# Patient Record
Sex: Female | Born: 1945 | Race: Black or African American | Hispanic: No | Marital: Single | Smoking: Current every day smoker
Health system: Southern US, Community
[De-identification: ages and names within clinical notes are randomized; demographics above are authoritative.]

## PROBLEM LIST (undated history)

## (undated) DIAGNOSIS — F17200 Nicotine dependence, unspecified, uncomplicated: Secondary | ICD-10-CM

## (undated) DIAGNOSIS — M1990 Unspecified osteoarthritis, unspecified site: Secondary | ICD-10-CM

## (undated) DIAGNOSIS — M48061 Spinal stenosis, lumbar region without neurogenic claudication: Secondary | ICD-10-CM

## (undated) DIAGNOSIS — I10 Essential (primary) hypertension: Secondary | ICD-10-CM

## (undated) DIAGNOSIS — N289 Disorder of kidney and ureter, unspecified: Secondary | ICD-10-CM

## (undated) DIAGNOSIS — J189 Pneumonia, unspecified organism: Secondary | ICD-10-CM

## (undated) DIAGNOSIS — E785 Hyperlipidemia, unspecified: Secondary | ICD-10-CM

## (undated) DIAGNOSIS — J45909 Unspecified asthma, uncomplicated: Secondary | ICD-10-CM

## (undated) DIAGNOSIS — R51 Headache: Secondary | ICD-10-CM

## (undated) DIAGNOSIS — J309 Allergic rhinitis, unspecified: Secondary | ICD-10-CM

## (undated) DIAGNOSIS — IMO0001 Reserved for inherently not codable concepts without codable children: Secondary | ICD-10-CM

## (undated) HISTORY — PX: COLONOSCOPY: SHX5424

## (undated) HISTORY — DX: Essential (primary) hypertension: I10

## (undated) HISTORY — DX: Unspecified asthma, uncomplicated: J45.909

## (undated) HISTORY — DX: Allergic rhinitis, unspecified: J30.9

## (undated) HISTORY — DX: Disorder of kidney and ureter, unspecified: N28.9

## (undated) HISTORY — DX: Spinal stenosis, lumbar region without neurogenic claudication: M48.061

## (undated) HISTORY — DX: Headache: R51

## (undated) HISTORY — DX: Nicotine dependence, unspecified, uncomplicated: F17.200

## (undated) HISTORY — DX: Hyperlipidemia, unspecified: E78.5

---

## 1988-05-18 HISTORY — PX: OTHER SURGICAL HISTORY: SHX169

## 1993-05-18 HISTORY — PX: OTHER SURGICAL HISTORY: SHX169

## 1997-11-10 ENCOUNTER — Emergency Department (HOSPITAL_COMMUNITY): Admission: EM | Admit: 1997-11-10 | Discharge: 1997-11-10 | Payer: Self-pay | Admitting: Emergency Medicine

## 1998-03-08 ENCOUNTER — Emergency Department (HOSPITAL_COMMUNITY): Admission: EM | Admit: 1998-03-08 | Discharge: 1998-03-08 | Payer: Self-pay | Admitting: Emergency Medicine

## 1999-12-14 ENCOUNTER — Inpatient Hospital Stay (HOSPITAL_COMMUNITY): Admission: AD | Admit: 1999-12-14 | Discharge: 1999-12-14 | Payer: Self-pay | Admitting: Obstetrics

## 2000-11-04 ENCOUNTER — Other Ambulatory Visit: Admission: RE | Admit: 2000-11-04 | Discharge: 2000-11-04 | Payer: Self-pay | Admitting: General Practice

## 2000-11-15 ENCOUNTER — Ambulatory Visit (HOSPITAL_COMMUNITY): Admission: RE | Admit: 2000-11-15 | Discharge: 2000-11-15 | Payer: Self-pay | Admitting: Cardiology

## 2000-11-15 ENCOUNTER — Encounter: Payer: Self-pay | Admitting: Cardiology

## 2001-11-21 ENCOUNTER — Emergency Department (HOSPITAL_COMMUNITY): Admission: EM | Admit: 2001-11-21 | Discharge: 2001-11-22 | Payer: Self-pay | Admitting: Emergency Medicine

## 2002-06-01 ENCOUNTER — Encounter: Payer: Self-pay | Admitting: Cardiology

## 2002-06-01 ENCOUNTER — Encounter: Admission: RE | Admit: 2002-06-01 | Discharge: 2002-06-01 | Payer: Self-pay | Admitting: Cardiology

## 2002-06-16 ENCOUNTER — Encounter: Admission: RE | Admit: 2002-06-16 | Discharge: 2002-06-16 | Payer: Self-pay | Admitting: *Deleted

## 2002-06-16 ENCOUNTER — Other Ambulatory Visit: Admission: RE | Admit: 2002-06-16 | Discharge: 2002-06-16 | Payer: Self-pay | Admitting: *Deleted

## 2002-07-07 ENCOUNTER — Encounter (INDEPENDENT_AMBULATORY_CARE_PROVIDER_SITE_OTHER): Payer: Self-pay | Admitting: *Deleted

## 2002-07-07 ENCOUNTER — Other Ambulatory Visit: Admission: RE | Admit: 2002-07-07 | Discharge: 2002-07-07 | Payer: Self-pay | Admitting: *Deleted

## 2002-07-07 ENCOUNTER — Encounter: Admission: RE | Admit: 2002-07-07 | Discharge: 2002-07-07 | Payer: Self-pay | Admitting: *Deleted

## 2003-01-30 ENCOUNTER — Encounter: Payer: Self-pay | Admitting: Emergency Medicine

## 2003-01-30 ENCOUNTER — Emergency Department (HOSPITAL_COMMUNITY): Admission: EM | Admit: 2003-01-30 | Discharge: 2003-01-30 | Payer: Self-pay | Admitting: Emergency Medicine

## 2003-03-26 ENCOUNTER — Inpatient Hospital Stay (HOSPITAL_COMMUNITY): Admission: RE | Admit: 2003-03-26 | Discharge: 2003-04-01 | Payer: Self-pay | Admitting: Neurosurgery

## 2003-04-03 ENCOUNTER — Emergency Department (HOSPITAL_COMMUNITY): Admission: EM | Admit: 2003-04-03 | Discharge: 2003-04-03 | Payer: Self-pay | Admitting: Emergency Medicine

## 2003-06-05 ENCOUNTER — Encounter: Admission: RE | Admit: 2003-06-05 | Discharge: 2003-06-05 | Payer: Self-pay | Admitting: Neurosurgery

## 2004-06-02 ENCOUNTER — Emergency Department (HOSPITAL_COMMUNITY): Admission: EM | Admit: 2004-06-02 | Discharge: 2004-06-02 | Payer: Self-pay | Admitting: Emergency Medicine

## 2004-08-05 ENCOUNTER — Emergency Department (HOSPITAL_COMMUNITY): Admission: EM | Admit: 2004-08-05 | Discharge: 2004-08-05 | Payer: Self-pay | Admitting: Emergency Medicine

## 2004-08-06 ENCOUNTER — Emergency Department (HOSPITAL_COMMUNITY): Admission: EM | Admit: 2004-08-06 | Discharge: 2004-08-06 | Payer: Self-pay | Admitting: Emergency Medicine

## 2004-12-15 ENCOUNTER — Ambulatory Visit (HOSPITAL_COMMUNITY): Admission: RE | Admit: 2004-12-15 | Discharge: 2004-12-15 | Payer: Self-pay | Admitting: *Deleted

## 2005-01-01 ENCOUNTER — Ambulatory Visit: Payer: Self-pay | Admitting: Obstetrics and Gynecology

## 2005-05-03 ENCOUNTER — Emergency Department (HOSPITAL_COMMUNITY): Admission: EM | Admit: 2005-05-03 | Discharge: 2005-05-03 | Payer: Self-pay | Admitting: Emergency Medicine

## 2005-06-03 ENCOUNTER — Ambulatory Visit (HOSPITAL_COMMUNITY): Admission: RE | Admit: 2005-06-03 | Discharge: 2005-06-03 | Payer: Self-pay | Admitting: Cardiology

## 2005-08-20 ENCOUNTER — Emergency Department (HOSPITAL_COMMUNITY): Admission: EM | Admit: 2005-08-20 | Discharge: 2005-08-20 | Payer: Self-pay | Admitting: Emergency Medicine

## 2005-08-28 ENCOUNTER — Emergency Department (HOSPITAL_COMMUNITY): Admission: EM | Admit: 2005-08-28 | Discharge: 2005-08-28 | Payer: Self-pay | Admitting: Emergency Medicine

## 2005-09-06 ENCOUNTER — Emergency Department (HOSPITAL_COMMUNITY): Admission: EM | Admit: 2005-09-06 | Discharge: 2005-09-06 | Payer: Self-pay | Admitting: Emergency Medicine

## 2005-12-17 ENCOUNTER — Emergency Department (HOSPITAL_COMMUNITY): Admission: EM | Admit: 2005-12-17 | Discharge: 2005-12-17 | Payer: Self-pay | Admitting: Emergency Medicine

## 2006-01-04 ENCOUNTER — Ambulatory Visit (HOSPITAL_COMMUNITY): Admission: RE | Admit: 2006-01-04 | Discharge: 2006-01-04 | Payer: Self-pay | Admitting: Cardiology

## 2006-01-15 ENCOUNTER — Encounter: Admission: RE | Admit: 2006-01-15 | Discharge: 2006-01-15 | Payer: Self-pay | Admitting: Cardiology

## 2006-01-19 ENCOUNTER — Inpatient Hospital Stay (HOSPITAL_COMMUNITY): Admission: EM | Admit: 2006-01-19 | Discharge: 2006-01-23 | Payer: Self-pay | Admitting: Emergency Medicine

## 2006-03-24 ENCOUNTER — Ambulatory Visit: Payer: Self-pay | Admitting: Obstetrics & Gynecology

## 2006-03-24 ENCOUNTER — Encounter (INDEPENDENT_AMBULATORY_CARE_PROVIDER_SITE_OTHER): Payer: Self-pay | Admitting: Obstetrics & Gynecology

## 2006-03-28 ENCOUNTER — Encounter: Admission: RE | Admit: 2006-03-28 | Discharge: 2006-03-28 | Payer: Self-pay | Admitting: Neurosurgery

## 2006-04-19 ENCOUNTER — Encounter: Admission: RE | Admit: 2006-04-19 | Discharge: 2006-05-18 | Payer: Self-pay | Admitting: Neurosurgery

## 2006-05-19 ENCOUNTER — Encounter: Admission: RE | Admit: 2006-05-19 | Discharge: 2006-08-17 | Payer: Self-pay | Admitting: Neurosurgery

## 2006-07-10 ENCOUNTER — Emergency Department (HOSPITAL_COMMUNITY): Admission: EM | Admit: 2006-07-10 | Discharge: 2006-07-10 | Payer: Self-pay | Admitting: Emergency Medicine

## 2006-08-10 ENCOUNTER — Emergency Department (HOSPITAL_COMMUNITY): Admission: EM | Admit: 2006-08-10 | Discharge: 2006-08-10 | Payer: Self-pay | Admitting: Family Medicine

## 2006-08-11 ENCOUNTER — Emergency Department (HOSPITAL_COMMUNITY): Admission: EM | Admit: 2006-08-11 | Discharge: 2006-08-11 | Payer: Self-pay | Admitting: Emergency Medicine

## 2006-12-04 ENCOUNTER — Emergency Department (HOSPITAL_COMMUNITY): Admission: EM | Admit: 2006-12-04 | Discharge: 2006-12-05 | Payer: Self-pay | Admitting: Emergency Medicine

## 2007-01-06 ENCOUNTER — Ambulatory Visit (HOSPITAL_COMMUNITY): Admission: RE | Admit: 2007-01-06 | Discharge: 2007-01-06 | Payer: Self-pay | Admitting: Cardiology

## 2007-01-06 ENCOUNTER — Encounter (INDEPENDENT_AMBULATORY_CARE_PROVIDER_SITE_OTHER): Payer: Self-pay | Admitting: Gynecology

## 2007-01-06 ENCOUNTER — Ambulatory Visit: Payer: Self-pay | Admitting: Gynecology

## 2007-01-07 ENCOUNTER — Emergency Department (HOSPITAL_COMMUNITY): Admission: EM | Admit: 2007-01-07 | Discharge: 2007-01-08 | Payer: Self-pay | Admitting: Emergency Medicine

## 2007-01-26 ENCOUNTER — Emergency Department (HOSPITAL_COMMUNITY): Admission: EM | Admit: 2007-01-26 | Discharge: 2007-01-26 | Payer: Self-pay | Admitting: Emergency Medicine

## 2007-02-22 ENCOUNTER — Emergency Department (HOSPITAL_COMMUNITY): Admission: EM | Admit: 2007-02-22 | Discharge: 2007-02-22 | Payer: Self-pay | Admitting: Emergency Medicine

## 2007-02-23 ENCOUNTER — Emergency Department (HOSPITAL_COMMUNITY): Admission: EM | Admit: 2007-02-23 | Discharge: 2007-02-23 | Payer: Self-pay | Admitting: Emergency Medicine

## 2007-06-06 ENCOUNTER — Emergency Department (HOSPITAL_COMMUNITY): Admission: EM | Admit: 2007-06-06 | Discharge: 2007-06-06 | Payer: Self-pay | Admitting: Family Medicine

## 2007-06-07 ENCOUNTER — Inpatient Hospital Stay (HOSPITAL_COMMUNITY): Admission: EM | Admit: 2007-06-07 | Discharge: 2007-06-13 | Payer: Self-pay | Admitting: Emergency Medicine

## 2007-06-24 ENCOUNTER — Ambulatory Visit (HOSPITAL_COMMUNITY): Admission: RE | Admit: 2007-06-24 | Discharge: 2007-06-24 | Payer: Self-pay | Admitting: Cardiology

## 2007-08-08 ENCOUNTER — Emergency Department (HOSPITAL_COMMUNITY): Admission: EM | Admit: 2007-08-08 | Discharge: 2007-08-08 | Payer: Self-pay | Admitting: Emergency Medicine

## 2007-08-09 ENCOUNTER — Emergency Department (HOSPITAL_COMMUNITY): Admission: EM | Admit: 2007-08-09 | Discharge: 2007-08-09 | Payer: Self-pay | Admitting: Emergency Medicine

## 2007-09-07 ENCOUNTER — Emergency Department (HOSPITAL_COMMUNITY): Admission: EM | Admit: 2007-09-07 | Discharge: 2007-09-07 | Payer: Self-pay | Admitting: Emergency Medicine

## 2008-01-04 ENCOUNTER — Emergency Department (HOSPITAL_COMMUNITY): Admission: EM | Admit: 2008-01-04 | Discharge: 2008-01-04 | Payer: Self-pay | Admitting: Family Medicine

## 2008-01-19 ENCOUNTER — Ambulatory Visit (HOSPITAL_COMMUNITY): Admission: RE | Admit: 2008-01-19 | Discharge: 2008-01-19 | Payer: Self-pay | Admitting: Cardiology

## 2008-02-01 ENCOUNTER — Encounter: Payer: Self-pay | Admitting: Obstetrics & Gynecology

## 2008-02-01 ENCOUNTER — Ambulatory Visit: Payer: Self-pay | Admitting: Obstetrics & Gynecology

## 2008-05-24 ENCOUNTER — Inpatient Hospital Stay (HOSPITAL_COMMUNITY): Admission: AD | Admit: 2008-05-24 | Discharge: 2008-05-24 | Payer: Self-pay | Admitting: Family Medicine

## 2008-09-07 ENCOUNTER — Inpatient Hospital Stay (HOSPITAL_COMMUNITY): Admission: AD | Admit: 2008-09-07 | Discharge: 2008-09-07 | Payer: Self-pay | Admitting: Obstetrics & Gynecology

## 2008-09-07 ENCOUNTER — Ambulatory Visit: Payer: Self-pay | Admitting: Family

## 2008-09-24 ENCOUNTER — Emergency Department (HOSPITAL_COMMUNITY): Admission: EM | Admit: 2008-09-24 | Discharge: 2008-09-24 | Payer: Self-pay | Admitting: Family Medicine

## 2008-10-01 ENCOUNTER — Encounter: Admission: RE | Admit: 2008-10-01 | Discharge: 2008-10-01 | Payer: Self-pay | Admitting: Cardiology

## 2008-10-18 ENCOUNTER — Ambulatory Visit: Payer: Self-pay | Admitting: Obstetrics and Gynecology

## 2008-10-18 LAB — CONVERTED CEMR LAB
Trich, Wet Prep: NONE SEEN
Yeast Wet Prep HPF POC: NONE SEEN

## 2008-10-19 ENCOUNTER — Encounter: Payer: Self-pay | Admitting: Obstetrics and Gynecology

## 2008-10-19 ENCOUNTER — Inpatient Hospital Stay (HOSPITAL_COMMUNITY): Admission: AD | Admit: 2008-10-19 | Discharge: 2008-10-19 | Payer: Self-pay | Admitting: Obstetrics & Gynecology

## 2008-10-19 LAB — CONVERTED CEMR LAB

## 2008-10-25 ENCOUNTER — Ambulatory Visit: Payer: Self-pay | Admitting: Obstetrics & Gynecology

## 2008-10-25 ENCOUNTER — Encounter: Payer: Self-pay | Admitting: Obstetrics and Gynecology

## 2008-10-25 LAB — CONVERTED CEMR LAB
CA 125: 4.3 units/mL (ref 0.0–30.2)
HCT: 41 % (ref 36.0–46.0)
Platelets: 195 10*3/uL (ref 150–400)
RDW: 14.1 % (ref 11.5–15.5)
TSH: 0.691 microintl units/mL (ref 0.350–4.500)
WBC: 9.2 10*3/uL (ref 4.0–10.5)

## 2008-12-20 ENCOUNTER — Ambulatory Visit (HOSPITAL_COMMUNITY): Admission: RE | Admit: 2008-12-20 | Discharge: 2008-12-20 | Payer: Self-pay | Admitting: Obstetrics & Gynecology

## 2008-12-21 ENCOUNTER — Emergency Department (HOSPITAL_COMMUNITY): Admission: EM | Admit: 2008-12-21 | Discharge: 2008-12-21 | Payer: Self-pay | Admitting: Family Medicine

## 2009-01-10 ENCOUNTER — Ambulatory Visit: Payer: Self-pay | Admitting: Obstetrics and Gynecology

## 2009-01-22 ENCOUNTER — Ambulatory Visit (HOSPITAL_COMMUNITY): Admission: RE | Admit: 2009-01-22 | Discharge: 2009-01-22 | Payer: Self-pay | Admitting: Cardiology

## 2009-01-31 ENCOUNTER — Ambulatory Visit: Payer: Self-pay | Admitting: Obstetrics and Gynecology

## 2009-01-31 LAB — CONVERTED CEMR LAB

## 2009-02-20 ENCOUNTER — Ambulatory Visit: Payer: Self-pay | Admitting: Internal Medicine

## 2009-02-20 DIAGNOSIS — J45909 Unspecified asthma, uncomplicated: Secondary | ICD-10-CM

## 2009-02-20 DIAGNOSIS — E785 Hyperlipidemia, unspecified: Secondary | ICD-10-CM

## 2009-02-20 DIAGNOSIS — M48061 Spinal stenosis, lumbar region without neurogenic claudication: Secondary | ICD-10-CM | POA: Insufficient documentation

## 2009-02-20 DIAGNOSIS — I10 Essential (primary) hypertension: Secondary | ICD-10-CM | POA: Insufficient documentation

## 2009-02-20 DIAGNOSIS — M4802 Spinal stenosis, cervical region: Secondary | ICD-10-CM | POA: Insufficient documentation

## 2009-02-20 DIAGNOSIS — M25519 Pain in unspecified shoulder: Secondary | ICD-10-CM

## 2009-02-20 DIAGNOSIS — M545 Low back pain: Secondary | ICD-10-CM | POA: Insufficient documentation

## 2009-02-20 DIAGNOSIS — F17200 Nicotine dependence, unspecified, uncomplicated: Secondary | ICD-10-CM | POA: Insufficient documentation

## 2009-02-20 DIAGNOSIS — J309 Allergic rhinitis, unspecified: Secondary | ICD-10-CM | POA: Insufficient documentation

## 2009-02-20 DIAGNOSIS — M25559 Pain in unspecified hip: Secondary | ICD-10-CM | POA: Insufficient documentation

## 2009-02-20 HISTORY — DX: Hyperlipidemia, unspecified: E78.5

## 2009-02-22 ENCOUNTER — Telehealth: Payer: Self-pay | Admitting: Internal Medicine

## 2009-03-01 ENCOUNTER — Ambulatory Visit: Payer: Self-pay | Admitting: Obstetrics & Gynecology

## 2009-03-01 ENCOUNTER — Encounter: Payer: Self-pay | Admitting: Obstetrics & Gynecology

## 2009-03-05 ENCOUNTER — Encounter: Payer: Self-pay | Admitting: Internal Medicine

## 2009-03-08 ENCOUNTER — Encounter: Admission: RE | Admit: 2009-03-08 | Discharge: 2009-03-08 | Payer: Self-pay | Admitting: Neurological Surgery

## 2009-03-12 ENCOUNTER — Encounter: Payer: Self-pay | Admitting: Internal Medicine

## 2009-07-11 ENCOUNTER — Encounter: Payer: Self-pay | Admitting: Internal Medicine

## 2009-07-11 LAB — CONVERTED CEMR LAB: ALT: 19 units/L

## 2009-08-14 ENCOUNTER — Ambulatory Visit: Payer: Self-pay | Admitting: Internal Medicine

## 2009-08-14 DIAGNOSIS — M25569 Pain in unspecified knee: Secondary | ICD-10-CM

## 2009-08-16 ENCOUNTER — Emergency Department (HOSPITAL_COMMUNITY): Admission: EM | Admit: 2009-08-16 | Discharge: 2009-08-17 | Payer: Self-pay | Admitting: Emergency Medicine

## 2009-08-16 DIAGNOSIS — R51 Headache: Secondary | ICD-10-CM | POA: Insufficient documentation

## 2009-08-16 DIAGNOSIS — R519 Headache, unspecified: Secondary | ICD-10-CM | POA: Insufficient documentation

## 2009-08-16 LAB — CONVERTED CEMR LAB
BUN: 17 mg/dL
Basophils Relative: 0.3 %
Glucose, Bld: 83 mg/dL
Hemoglobin: 13.9 g/dL
MCV: 95.7 fL
Neutrophils Relative %: 64 %
Platelets: 171 10*3/uL
RBC: 4.15 M/uL
Sodium: 145 meq/L
WBC: 11.4 10*3/uL

## 2009-09-27 ENCOUNTER — Ambulatory Visit: Payer: Self-pay | Admitting: Internal Medicine

## 2009-09-27 LAB — CONVERTED CEMR LAB
Cholesterol: 173 mg/dL (ref 0–200)
HDL: 53.2 mg/dL (ref >39.00)
VLDL: 14.6 mg/dL (ref 0.0–40.0)

## 2009-10-02 ENCOUNTER — Ambulatory Visit (HOSPITAL_COMMUNITY): Admission: RE | Admit: 2009-10-02 | Discharge: 2009-10-02 | Payer: Self-pay | Admitting: Internal Medicine

## 2009-10-17 ENCOUNTER — Encounter: Admission: RE | Admit: 2009-10-17 | Discharge: 2009-11-04 | Payer: Self-pay | Admitting: Internal Medicine

## 2009-10-17 ENCOUNTER — Encounter: Payer: Self-pay | Admitting: Internal Medicine

## 2009-11-01 ENCOUNTER — Encounter: Payer: Self-pay | Admitting: Internal Medicine

## 2010-01-24 ENCOUNTER — Ambulatory Visit: Payer: Self-pay | Admitting: Internal Medicine

## 2010-03-10 ENCOUNTER — Ambulatory Visit (HOSPITAL_COMMUNITY): Admission: RE | Admit: 2010-03-10 | Discharge: 2010-03-10 | Payer: Self-pay | Admitting: Internal Medicine

## 2010-03-10 ENCOUNTER — Ambulatory Visit: Payer: Self-pay | Admitting: Obstetrics & Gynecology

## 2010-03-11 ENCOUNTER — Inpatient Hospital Stay (HOSPITAL_COMMUNITY): Admission: AD | Admit: 2010-03-11 | Discharge: 2010-03-11 | Payer: Self-pay | Admitting: Family Medicine

## 2010-03-11 ENCOUNTER — Ambulatory Visit: Payer: Self-pay | Admitting: Gynecology

## 2010-03-11 ENCOUNTER — Encounter: Payer: Self-pay | Admitting: Obstetrics & Gynecology

## 2010-03-11 LAB — CONVERTED CEMR LAB
Trich, Wet Prep: NONE SEEN
WBC, Wet Prep HPF POC: NONE SEEN

## 2010-03-13 ENCOUNTER — Ambulatory Visit: Payer: Self-pay | Admitting: Obstetrics & Gynecology

## 2010-03-13 ENCOUNTER — Ambulatory Visit (HOSPITAL_COMMUNITY): Admission: RE | Admit: 2010-03-13 | Discharge: 2010-03-13 | Payer: Self-pay | Admitting: Obstetrics & Gynecology

## 2010-05-02 ENCOUNTER — Ambulatory Visit: Payer: Self-pay | Admitting: Internal Medicine

## 2010-05-30 ENCOUNTER — Ambulatory Visit
Admission: RE | Admit: 2010-05-30 | Discharge: 2010-05-30 | Payer: Self-pay | Source: Home / Self Care | Attending: Internal Medicine | Admitting: Internal Medicine

## 2010-05-30 DIAGNOSIS — J45901 Unspecified asthma with (acute) exacerbation: Secondary | ICD-10-CM | POA: Insufficient documentation

## 2010-06-09 ENCOUNTER — Encounter: Payer: Self-pay | Admitting: *Deleted

## 2010-06-11 ENCOUNTER — Encounter: Payer: Self-pay | Admitting: Internal Medicine

## 2010-06-17 NOTE — Letter (Signed)
Summary:  [facility]   Imported By: Sherian Rein 07/26/2009 07:44:01  _____________________________________________________________________  External Attachment:    Type:   Image     Comment:   External Document

## 2010-06-17 NOTE — Assessment & Plan Note (Signed)
Summary: 4 MTH FU---STC   Vital Signs:  Patient profile:   65 year old female Menstrual status:  postmenopausal Height:      65.5 inches (166.37 cm) Weight:      139.0 pounds (63.18 kg) BMI:     22.86 O2 Sat:      97 % on Room air Temp:     98.1 degrees F (36.72 degrees C) oral Pulse rate:   94 / minute BP sitting:   142 / 98  (left arm) Cuff size:   regular  Vitals Entered By: Orlan Leavens RMA (January 24, 2010 1:16 PM)  O2 Flow:  Room air CC: 4 month follow-up Is Patient Diabetic? No Pain Assessment Patient in pain? no      Comments Want flu shot   Primary Care Provider:  Rene Paci, MD  CC:  4 month follow-up.  History of Present Illness: here for ER f/u seen 08/16/09 for HA - CT, labs and exam all reviewed today- given motrin 800mg  tabs and referral to neuro s/p mri due to abn ct head - SVD  hx right sided shoulder and arm pain - similar pain symptoms to when she had neck and back problems - operated on in past by Nsurg (2004) chronic right hand numbness hx -  reports injury to nerve with ABG lab check requiring surg in 1990s - subsquent disablitiy from injury and numbness - but no weakness min relief with ultram, ?other pain pill pain exac by activity but present constantly -even at rest or lying down saw NSurg Elsner for same in 02/2009 - notes reviewed -  had complete relief of pain for months following steroid shot in knee and shoulder  HTN hx - follows with dr. Sharyn Lull no recent change in meds - has "been good like it is for over 5 years"- recent noncompliance due to financial stressors - ran out of medications  dyslipidemia hx - on crestor for years -reports compliance with ongoing medical treatment and no changes in medication dose or frequency. denies adverse side effects related to current therapy.   asthma - reports compliance with ongoing medical treatment and no changes in medication dose or frequency. denies adverse side effects related to  current therapy.    Clinical Review Panels:  Prevention   Last Pap Smear:  NEGATIVE FOR INTRAEPITHELIAL LESIONS OR MALIGNANCY. (02/01/2008)  Lipid Management   Cholesterol:  173 (09/27/2009)   LDL (bad choesterol):  105 (09/27/2009)   HDL (good cholesterol):  53.20 (09/27/2009)  CBC   WBC:  11.4 (08/16/2009)   RBC:  4.15 (08/16/2009)   Hgb:  13.9 (08/16/2009)   Hct:  41.0 (08/16/2009)   Platelets:  171 (08/16/2009)   MCV  95.7 (08/16/2009)   MCHC  32.4 (10/25/2008)   RDW  12.9 (08/16/2009)   PMN:  64 (08/16/2009)   Monos:  6 (08/16/2009)   Eosinophils:  3 (08/16/2009)   Basophil:  0.3 (08/16/2009)  Complete Metabolic Panel   Glucose:  83 (08/16/2009)   Sodium:  145 (08/16/2009)   Potassium:  3.9 (08/16/2009)   Chloride:  109 (08/16/2009)   CO2:  30 (08/16/2009)   BUN:  17 (08/16/2009)   Creatinine:  1.5 (08/16/2009)   SGPT (ALT):  19 (07/11/2009)   Current Medications (verified): 1)  Losartan Potassium 100 Mg Tabs (Losartan Potassium) .... Take One Tab By Mouth Once Daily 2)  Singulair 10 Mg Tabs (Montelukast Sodium) .... Take One Tab By Mouth Once Daily 3)  Diltiazem Hcl Er  Beads 240 Mg Xr24h-Cap (Diltiazem Hcl Er Beads) .... Take One Tab Once Daily 4)  Tramadol Hcl 50 Mg Tabs (Tramadol Hcl) .... Take 2 Tabs By Mouth Every 12 Hours 5)  Alendronate Sodium 70 Mg Tabs (Alendronate Sodium) .... Take One Tab  Every Week 6)  Fluconazole 150 Mg Tabs (Fluconazole) .... Take One Tab By Mouth Once Daily 7)  Crestor 10 Mg Tabs (Rosuvastatin Calcium) .... Take One Tab Once Daily 8)  Spiriva Handihaler 18 Mcg Caps (Tiotropium Bromide Monohydrate) .... One Spray Once Daily 9)  Advair Diskus 250-50 Mcg/dose Aepb (Fluticasone-Salmeterol) .[name].. 1 Spray Two Times A Day 10)  Furosemide 20 Mg Tabs (Furosemide) .... Take One Tab Once Daily 11)  Allegra-D 12 Hour 60-120 Mg Xr12h-Tab (Fexofenadine-Pseudoephedrine) .[name].. 1 By Mouth Q12h As Needed For Sinus Congestion and Pressure or  Headache 12)  Vitamin D3 1000 Unit Caps (Cholecalciferol) .... Take 2 By Mouth Qd 13)  Meloxicam 15 Mg Tabs (Meloxicam) .[name].. 1 By Mouth Once Daily For Pain X 7 Days, Then As Needed 14)  Chantix 1 Mg Tabs (Varenicline Tartrate) .... Take 1 By Mouth Once Daily  Allergies (verified): No Known Drug Allergies  Past History:  Past Medical History: asthma hypertension dyslipidemia seasonal allergies lumbar DDD s/p surg 2004 mod lumbar spinal stenosis - MRI 8/07    MD roster: cards - harwani Nsurg - Elsner renal - coladonato uro - humphries  Review of Systems  The patient denies weight loss, chest pain, syncope, and headaches.    Physical Exam  General:  alert, well-developed, well-nourished, and cooperative to examination.    Lungs:  normal respiratory effort, no intercostal retractions or use of accessory muscles; normal breath sounds bilaterally - no crackles and no wheezes.    Heart:  normal rate, regular rhythm, no murmur, and no rub. BLE without edema. Neurologic:  alert & oriented X3 and cranial nerves II-XII symetrically intact.  strength normal in all extremities, sensation intact to light touch, and gait normal. speech fluent without dysarthria or aphasia; follows commands with good comprehension.    Impression & Recommendations:  Problem # 1:  HYPERTENSION (ICD-401.9) out of meds due to financial strain - 2 week supply diovan hct given as pt believes she can get med refill next week -  prev well controlled so willnot purose other med change at this time Her updated medication list for this problem includes:    Losartan Potassium 100 Mg Tabs (Losartan potassium) .[name]... Take one tab by mouth once daily    Diltiazem Hcl Er Beads 240 Mg Xr24h-cap (Diltiazem hcl er beads) .[name]... Take one tab once daily    Furosemide 20 Mg Tabs (Furosemide) .[name]... Take one tab once daily    Diovan Hct 320-25 Mg Tabs (Valsartan-hydrochlorothiazide) .[name]... 1 by mouth once daily x 2 weeks (or  until you fill and resume usual medications)  BP today: 142/98 Prior BP: 128/76 (09/27/2009)  Labs Reviewed: K+: 3.9 (08/16/2009) Creat: : 1.5 (08/16/2009)   Chol: 173 (09/27/2009)   HDL: 53.20 (09/27/2009)   LDL: 105 (09/27/2009)   TG: 73.0 (09/27/2009)  Problem # 2:  HEADACHE (ICD-784.0) MRI 09/2009 review -  reports seeing neuro re: same - ?SVD or demyelination dz - will send for copy of these records/consult cont ASD rsk factor control Her updated medication list for this problem includes:    Tramadol Hcl 50 Mg Tabs (Tramadol hcl) .[name]... Take 2 tabs by mouth every 12 hours    Meloxicam 15 Mg Tabs (Meloxicam) .Marland KitchenMarland KitchenMarland KitchenMarland Kitchen 1  by mouth once daily for pain x 7 days, then as needed  ER visit 08/16/09 reviewed - abn ct with ?chronic microvasc vs demyelnating process no ha at this time, exam benign today  Problem # 3:  DYSLIPIDEMIA (ICD-272.4)  Her updated medication list for this problem includes:    Crestor 10 Mg Tabs (Rosuvastatin calcium) .[name]... Take one tab once daily  Labs Reviewed: SGPT: 19 (07/11/2009)   HDL:53.20 (09/27/2009)  LDL:105 (09/27/2009)  Chol:173 (09/27/2009)  Trig:73.0 (09/27/2009)  Problem # 4:  ASTHMA, UNSPECIFIED, UNSPECIFIED STATUS (ICD-493.90)  Her updated medication list for this problem includes:    Singulair 10 Mg Tabs (Montelukast sodium) .[name]... Take one tab by mouth once daily    Spiriva Handihaler 18 Mcg Caps (Tiotropium bromide monohydrate) ..... One spray once daily    Advair Diskus 250-50 Mcg/dose Aepb (Fluticasone-salmeterol) .[name]... 1 spray two times a day  Pulmonary Functions Reviewed: O2 sat: 97 (01/24/2010)  Complete Medication List: 1)  Losartan Potassium 100 Mg Tabs (Losartan potassium) .... Take one tab by mouth once daily 2)  Singulair 10 Mg Tabs (Montelukast sodium) .... Take one tab by mouth once daily 3)  Diltiazem Hcl Er Beads 240 Mg Xr24h-cap (Diltiazem hcl er beads) .... Take one tab once daily 4)  Tramadol Hcl 50 Mg Tabs (Tramadol hcl) ....  Take 2 tabs by mouth every 12 hours 5)  Alendronate Sodium 70 Mg Tabs (Alendronate sodium) .... Take one tab  every week 6)  Fluconazole 150 Mg Tabs (Fluconazole) .... Take one tab by mouth once daily 7)  Crestor 10 Mg Tabs (Rosuvastatin calcium) .... Take one tab once daily 8)  Spiriva Handihaler 18 Mcg Caps (Tiotropium bromide monohydrate) .... One spray once daily 9)  Advair Diskus 250-50 Mcg/dose Aepb (Fluticasone-salmeterol) .[name].. 1 spray two times a day 10)  Furosemide 20 Mg Tabs (Furosemide) .... Take one tab once daily 11)  Allegra-d 12 Hour 60-120 Mg Xr12h-tab (Fexofenadine-pseudoephedrine) .[name].. 1 by mouth q12h as needed for sinus congestion and pressure or headache 12)  Vitamin D3 1000 Unit Caps (Cholecalciferol) .... Take 2 by mouth qd 13)  Meloxicam 15 Mg Tabs (Meloxicam) .[name].. 1 by mouth once daily for pain x 7 days, then as needed 14)  Diovan Hct 320-25 Mg Tabs (Valsartan-hydrochlorothiazide) .[name].. 1 by mouth once daily x 2 weeks (or until you fill and resume usual medications)  Other Orders: Flu Vaccine 61yrs + MEDICARE PATIENTS (E4540) Administration Flu vaccine - MCR (J8119) Flu Vaccine Consent Questions     Do you have a history of severe allergic reactions to this vaccine? no    Any prior history of allergic reactions to egg and/or gelatin? no    Do you have a sensitivity to the preservative Thimersol? no    Do you have a past history of Guillan-Barre Syndrome? no    Do you currently have an acute febrile illness? no    Have you ever had a severe reaction to latex? no    Vaccine information given and explained to patient? yes    Are you currently pregnant? no    Lot Number:[id number]   Exp Date:11/15/2010   Manufacturer: [employer]    Site Given  Right Deltoid IMation Flu vaccine - MCR ([id number])  Patient Instructions: 1)  it was good to see you today. 2)  use blood pressure medication samples until you are able to get your usual medications refills - 3)  no other medicine  change recommened 4)  will send for copy of records  from [facility] to review about the MRI results - 5)  Please schedule a follow-up appointment in 3-4 months to monitor blood pressure and other medical problems, call sooner if problems.  6)  give up those last cigarettes!!!   .Neysa Bonito

## 2010-06-17 NOTE — Miscellaneous (Signed)
Summary: [facility]  [place]   Imported By: Lester [place] 10/29/2009 14:39:13  _____________________________________________________________________  External Attachment:    Type:   Image     Comment:   External Document

## 2010-06-17 NOTE — Assessment & Plan Note (Signed)
Summary: 4 mth or sooner fu-#-stc   Vital Signs:  Patient profile:   65 year old female Menstrual status:  postmenopausal Height:      65.5 inches (166.37 cm) Weight:      136.12 pounds (61.87 kg) O2 Sat:      94 % on Room air Temp:     97.4 degrees F (36.33 degrees C) oral Pulse rate:   92 / minute BP sitting:   118 / 82  (left arm) Cuff size:   regular  Vitals Entered By: Orlan Leavens (August 14, 2009 3:53 PM)  O2 Flow:  Room air CC: 4 month follow-up/ Pt is concerning about pain on (R) side. Arm and leg Is Patient Diabetic? No Pain Assessment Patient in pain? yes     Location: () arm and leg Type: aching   Primary Care Provider:  Rene Paci, MD  CC:  4 month follow-up/ Pt is concerning about pain on (R) side. Arm and leg.  History of Present Illness: here for 4 mo f/u  c/o recurrent right sided pain - in right shoulder down arm and right back/hip down leg to knee, not below. onset >24 mos ago denies injury prior to onset of pain - similar pain symptoms to when she had neck and back problems - operated on in past by Nsurg (2004) min relief with ultram, ?other pain pill pain exac by activity but present constantly -even at rest or lying down saw NSurg Elsner for same in 02/2009 - notes reviewed - has complete relief of pain for months following steroid shot in knee and shoulder  HTN hx - follows with dr. Sharyn Lull no recent change in meds - has "been good like it is for over 5 years"  chroinc right hand numbness hx -  injury to nerve with ABG lab check requiring surg - subsquent disablitiy from injury and numbness - but no weakness symptoms present >72yrs  dyslipidemia hx - on crestor for years no adv SE from meds   Clinical Review Panels:  CBC   WBC:  9.2 (10/25/2008)   RBC:  4.38 (10/25/2008)   Hgb:  13.3 (10/25/2008)   Hct:  41.0 (10/25/2008)   Platelets:  195 (10/25/2008)   MCV  93.6 (10/25/2008)   MCHC  32.4 (10/25/2008)   RDW  14.1  (10/25/2008)   -  Date:  07/11/2009    BUN: 16    Creatinine: 1.35    SGPT (ALT): 19  Current Medications (verified): 1)  Losartan Potassium 100 Mg Tabs (Losartan Potassium) .... Take One Tab By Mouth Once Daily 2)  Singulair 10 Mg Tabs (Montelukast Sodium) .... Take One Tab By Mouth Once Daily 3)  Diltiazem Hcl Er Beads 240 Mg Xr24h-Cap (Diltiazem Hcl Er Beads) .... Take One Tab Once Daily 4)  Tramadol Hcl 50 Mg Tabs (Tramadol Hcl) .... Take 2 Tabs By Mouth Every 12 Hours 5)  Alendronate Sodium 70 Mg Tabs (Alendronate Sodium) .... Take One Tab  Every Week 6)  Fluconazole 150 Mg Tabs (Fluconazole) .... Take One Tab By Mouth Once Daily 7)  Crestor 10 Mg Tabs (Rosuvastatin Calcium) .... Take One Tab Once Daily 8)  Spiriva Handihaler 18 Mcg Caps (Tiotropium Bromide Monohydrate) .... One Spray Once Daily 9)  Advair Diskus 250-50 Mcg/dose Aepb (Fluticasone-Salmeterol) .[name].. 1 Spray Two Times A Day 10)  Furosemide 20 Mg Tabs (Furosemide) .... Take One Tab Once Daily 11)  Allegra-D 12 Hour 60-120 Mg Xr12h-Tab (Fexofenadine-Pseudoephedrine) .[name].. 1 By Mouth Q12h As Needed  For Sinus Congestion and Pressure or Headache 12)  Vitamin D3 1000 Unit Caps (Cholecalciferol) .... Take 2 By Mouth Qd  Allergies (verified): No Known Drug Allergies  Past History:  Past Medical History: asthma hypertension dyslipidemia seasonal allergies lumbar DDD s/p surg 2004 mod lumbar spinal stenosis - MRI 8/07  MD rooster: cards - harwani Nsurg - Elsner renal - coladonato uro - humphries  Review of Systems  The patient denies fever, chest pain, syncope, and headaches.    Physical Exam  General:  alert, well-developed, well-nourished, and cooperative to examination.    Lungs:  normal respiratory effort, no intercostal retractions or use of accessory muscles; normal breath sounds bilaterally - no crackles and no wheezes.    Heart:  normal rate, regular rhythm, no murmur, and no rub. BLE without  edema. Msk:  bilateral shoulders: full range of motion. full strength with testing rotator cuff. Negative impingement signs. Nontender to palpation. Neurovascularly intact. +crepitus on right  right hip: decreased internal rotation. Pain with log roll.  Groin pain present. Pain with abduction. positive Trendelenburg sign.   right knee: decreased range of motion, diffuse boggy synovitis. Tender to palpation on joint line. Increased pain with weight bearing. Positive crepitus.  Neurologic:  alert & oriented X3 and cranial nerves II-XII symetrically intact.  strength normal in all extremities, sensation intact to light touch, and gait normal. speech fluent without dysarthria or aphasia; follows commands with good comprehension.    Impression & Recommendations:  Problem # 1:  SHOULDER PAIN, RIGHT, CHRONIC (ICD-719.41)  note from Nsurg 02/2009 reviewed today - pt reminded of her relief s/p steroid injection -  also with sig abn on MRI... f/u Nsurg rec to determine best tx and mgmt of multisyst sources of pain - Her updated medication list for this problem includes:    Tramadol Hcl 50 Mg Tabs (Tramadol hcl) .[name]... Take 2 tabs by mouth every 12 hours    Meloxicam 15 Mg Tabs (Meloxicam) .[name]... 1 by mouth once daily for pain x 7 days, then as needed  Orders: Prescription Created Electronically [phone])  Problem # 2:  KNEE PAIN, RIGHT (ICD-719.46) r/o DJD with xray today - neg for arthritis trial meloxicam to help with pain until f/u with Nsurg Her updated medication list for this problem includes:    Tramadol Hcl 50 Mg Tabs (Tramadol hcl) .[name]... Take 2 tabs by mouth every 12 hours    Meloxicam 15 Mg Tabs (Meloxicam) .[name]... 1 by mouth once daily for pain x 7 days, then as needed  Orders: Prescription Created Electronically [phone]) T-Knee Right 2 view ([id number])  Problem # 3:  HYPERTENSION (ICD-401.9) Assessment: Improved  Her updated medication list for this problem includes:    Losartan  Potassium 100 Mg Tabs (Losartan potassium) .[name]... Take one tab by mouth once daily    Diltiazem Hcl Er Beads 240 Mg Xr24h-cap (Diltiazem hcl er beads) .[name]... Take one tab once daily    Furosemide 20 Mg Tabs (Furosemide) .[name]... Take one tab once daily  BP today: 118/82 Prior BP: 134/94 (02/20/2009)  Labs Reviewed: Creat: : 1.35 (07/11/2009)     Problem # 4:  DYSLIPIDEMIA (ICD-272.4)  Her updated medication list for this problem includes:    Crestor 10 Mg Tabs (Rosuvastatin calcium) .[name]... Take one tab once daily  Labs Reviewed: SGPT: 19 (07/11/2009)  Complete Medication List: 1)  Losartan Potassium 100 Mg Tabs (Losartan potassium) .... Take one tab by mouth once daily 2)  Singulair 10 Mg Tabs (Montelukast sodium) .Marland KitchenMarland KitchenMarland Kitchen  Take one tab by mouth once daily 3)  Diltiazem Hcl Er Beads 240 Mg Xr24h-cap (Diltiazem hcl er beads) .... Take one tab once daily 4)  Tramadol Hcl 50 Mg Tabs (Tramadol hcl) .... Take 2 tabs by mouth every 12 hours 5)  Alendronate Sodium 70 Mg Tabs (Alendronate sodium) .... Take one tab  every week 6)  Fluconazole 150 Mg Tabs (Fluconazole) .... Take one tab by mouth once daily 7)  Crestor 10 Mg Tabs (Rosuvastatin calcium) .... Take one tab once daily 8)  Spiriva Handihaler 18 Mcg Caps (Tiotropium bromide monohydrate) .... One spray once daily 9)  Advair Diskus 250-50 Mcg/dose Aepb (Fluticasone-salmeterol) .[name].. 1 spray two times a day 10)  Furosemide 20 Mg Tabs (Furosemide) .... Take one tab once daily 11)  Allegra-d 12 Hour 60-120 Mg Xr12h-tab (Fexofenadine-pseudoephedrine) .[name].. 1 by mouth q12h as needed for sinus congestion and pressure or headache 12)  Vitamin D3 1000 Unit Caps (Cholecalciferol) .... Take 2 by mouth qd 13)  Meloxicam 15 Mg Tabs (Meloxicam) .[name].. 1 by mouth once daily for pain x 7 days, then as needed  Patient Instructions: 1)  it was good to see you today. 2)  pain medicine for your arthritis pain - meloxicam - your prescription has been electronically  submitted to your pharmacy. Please take as directed. Contact our office if you believe you're having problems with the medication(s).  3)  xray  ordered today - your results will be called to you and message left on your home machine in next 24-48 hours. 4)  will make you a followup appointment with Dr. Verlee Rossetti office for pain in your right arm and leg to consider repeat shot or other intervention as needed - 5)  Please schedule a follow-up appointment in 4 months to recheck blood pressure, sooner if problems.  Prescriptions: MELOXICAM 15 MG TABS (MELOXICAM) 1 by mouth once daily for pain x 7 days, then as needed  #30 x 1   Entered and Authorized by:   Newt Lukes MD   Signed by:   Newt Lukes MD on 08/14/2009   Method used:   Electronically to        [facility] [phone]* (retail)       [address]       [place], [place]  [address]       Ph: [phone] or [phone]       Fax: [phone]   RxID:   [id number]

## 2010-06-17 NOTE — Assessment & Plan Note (Signed)
Summary: POST HOSP/NWS  #   Vital Signs:  Patient profile:   65 year old female Menstrual status:  postmenopausal Height:      65.5 inches (166.37 cm) Weight:      137.0 pounds (62.27 kg) O2 Sat:      95 % on Room air Temp:     98.0 degrees F (36.67 degrees C) oral Pulse rate:   109 / minute BP sitting:   128 / 76  (left arm) Cuff size:   regular  Vitals Entered By: Orlan Leavens (Sep 27, 2009 10:39 AM)  O2 Flow:  Room air CC: ER follow-up/ Headache Is Patient Diabetic? No Pain Assessment Patient in pain? no        Primary Care Provider:  Rene Paci, MD  CC:  ER follow-up/ Headache.  History of Present Illness: here for ER f/u seen 08/16/09 for HA - CT, labs and exam all reviewed today- given motrin 800mg  tabs and referral to neuro rec mri due to abn ct head  prior OV 07/2009 reviewed hx right sided pain - located in right shoulder down arm and right back/hip down leg to knee, not below. onset >24 mos ago denies injury prior to onset of pain - similar pain symptoms to when she had neck and back problems - operated on in past by Nsurg (2004) min relief with ultram, ?other pain pill pain exac by activity but present constantly -even at rest or lying down saw NSurg Elsner for same in 02/2009 - notes reviewed -  had complete relief of pain for months following steroid shot in knee and shoulder  HTN hx - follows with dr. Sharyn Lull no recent change in meds - has "been good like it is for over 5 years"  chroinc right hand numbness hx -  injury to nerve with ABG lab check requiring surg - subsquent disablitiy from injury and numbness - but no weakness symptoms present >80yrs  dyslipidemia hx - on crestor for years -reports compliance with ongoing medical treatment and no changes in medication dose or frequency. denies adverse side effects related to current therapy.    -  Date:  08/16/2009    BG Random: 83    BUN: 17    Creatinine: 1.5    Sodium: 145  Potassium: 3.9    Chloride: 109    CO2 Total: 30    WBC: 11.4    HGB: 13.9    HCT: 41.0    RBC: 4.15    PLT: 171    MCV: 95.7    RDW: 12.9    Neutrophil: 64    Lymphs: 27    Monos: 6    Eos: 3    Basophil: 0.3  Current Medications (verified): 1)  Losartan Potassium 100 Mg Tabs (Losartan Potassium) .... Take One Tab By Mouth Once Daily 2)  Singulair 10 Mg Tabs (Montelukast Sodium) .... Take One Tab By Mouth Once Daily 3)  Diltiazem Hcl Er Beads 240 Mg Xr24h-Cap (Diltiazem Hcl Er Beads) .... Take One Tab Once Daily 4)  Tramadol Hcl 50 Mg Tabs (Tramadol Hcl) .... Take 2 Tabs By Mouth Every 12 Hours 5)  Alendronate Sodium 70 Mg Tabs (Alendronate Sodium) .... Take One Tab  Every Week 6)  Fluconazole 150 Mg Tabs (Fluconazole) .... Take One Tab By Mouth Once Daily 7)  Crestor 10 Mg Tabs (Rosuvastatin Calcium) .... Take One Tab Once Daily 8)  Spiriva Handihaler 18 Mcg Caps (Tiotropium Bromide Monohydrate) .... One Spray Once Daily 9)  Advair Diskus 250-50 Mcg/dose Aepb (Fluticasone-Salmeterol) .[name].. 1 Spray Two Times A Day 10)  Furosemide 20 Mg Tabs (Furosemide) .... Take One Tab Once Daily 11)  Allegra-D 12 Hour 60-120 Mg Xr12h-Tab (Fexofenadine-Pseudoephedrine) .[name].. 1 By Mouth Q12h As Needed For Sinus Congestion and Pressure or Headache 12)  Vitamin D3 1000 Unit Caps (Cholecalciferol) .... Take 2 By Mouth Qd 13)  Meloxicam 15 Mg Tabs (Meloxicam) .[name].. 1 By Mouth Once Daily For Pain X 7 Days, Then As Needed 14)  Chantix 1 Mg Tabs (Varenicline Tartrate) .... Take 1 By Mouth Once Daily  Allergies (verified): No Known Drug Allergies  Past History:  Past Medical History: asthma hypertension dyslipidemia seasonal allergies lumbar DDD s/p surg 2004 mod lumbar spinal stenosis - MRI 8/07    MD rooster: cards - harwani Nsurg - Elsner renal - coladonato uro - humphries  Review of Systems  The patient denies fever, chest pain, syncope, peripheral edema, headaches, and abdominal pain.     Physical Exam  General:  alert, well-developed, well-nourished, and cooperative to examination.    Lungs:  normal respiratory effort, no intercostal retractions or use of accessory muscles; normal breath sounds bilaterally - no crackles and no wheezes.    Heart:  normal rate, regular rhythm, no murmur, and no rub. BLE without edema. Msk:  bilateral shoulders: full range of motion. full strength with testing rotator cuff. Negative impingement signs. Nontender to palpation. Neurovascularly intact. +crepitus on right  Neurologic:  alert & oriented X3 and cranial nerves II-XII symetrically intact.  strength normal in all extremities, sensation intact to light touch, and gait normal. speech fluent without dysarthria or aphasia; follows commands with good comprehension.    Impression & Recommendations:  Problem # 1:  HEADACHE (ICD-784.0)  ER visit 08/16/09 reviewed - abn ct with ?chronic microvasc vs demyelnating process no ha at this time, exam benign today order MRI for further eval now consider need for neuro eval - cont same meds Her updated medication list for this problem includes:    Tramadol Hcl 50 Mg Tabs (Tramadol hcl) .[name]... Take 2 tabs by mouth every 12 hours    Meloxicam 15 Mg Tabs (Meloxicam) .[name]... 1 by mouth once daily for pain x 7 days, then as needed  Orders: Radiology Referral (Radiology)  Problem # 2:  SPINAL STENOSIS, CERVICAL (ICD-723.0)  has not returned to nsurg since 02/2009 -  requests PT for arthritis - will order  Orders: Physical Therapy Referral (PT)  Problem # 3:  TOBACCO ABUSE (ICD-305.1)  Her updated medication list for this problem includes:    Chantix 1 Mg Tabs (Varenicline tartrate) .[name]... Take 1 by mouth once daily  Problem # 4:  DYSLIPIDEMIA (ICD-272.4)  Her updated medication list for this problem includes:    Crestor 10 Mg Tabs (Rosuvastatin calcium) .[name]... Take one tab once daily  Labs Reviewed: SGPT: 19 (07/11/2009)  Orders: TLB-Lipid  Panel (80061-LIPID)  Problem # 5:  HYPERTENSION (ICD-401.9)  Her updated medication list for this problem includes:    Losartan Potassium 100 Mg Tabs (Losartan potassium) .[name]... Take one tab by mouth once daily    Diltiazem Hcl Er Beads 240 Mg Xr24h-cap (Diltiazem hcl er beads) .[name]... Take one tab once daily    Furosemide 20 Mg Tabs (Furosemide) .[name]... Take one tab once daily  BP today: 128/76 Prior BP: 118/82 (08/14/2009)  Labs Reviewed: K+: 3.9 (08/16/2009) Creat: : 1.5 (08/16/2009)     Complete Medication List: 1)  Losartan Potassium 100 Mg Tabs (Losartan  potassium) .... Take one tab by mouth once daily 2)  Singulair 10 Mg Tabs (Montelukast sodium) .... Take one tab by mouth once daily 3)  Diltiazem Hcl Er Beads 240 Mg Xr24h-cap (Diltiazem hcl er beads) .... Take one tab once daily 4)  Tramadol Hcl 50 Mg Tabs (Tramadol hcl) .... Take 2 tabs by mouth every 12 hours 5)  Alendronate Sodium 70 Mg Tabs (Alendronate sodium) .... Take one tab  every week 6)  Fluconazole 150 Mg Tabs (Fluconazole) .... Take one tab by mouth once daily 7)  Crestor 10 Mg Tabs (Rosuvastatin calcium) .... Take one tab once daily 8)  Spiriva Handihaler 18 Mcg Caps (Tiotropium bromide monohydrate) .... One spray once daily 9)  Advair Diskus 250-50 Mcg/dose Aepb (Fluticasone-salmeterol) .[name].. 1 spray two times a day 10)  Furosemide 20 Mg Tabs (Furosemide) .... Take one tab once daily 11)  Allegra-d 12 Hour 60-120 Mg Xr12h-tab (Fexofenadine-pseudoephedrine) .[name].. 1 by mouth q12h as needed for sinus congestion and pressure or headache 12)  Vitamin D3 1000 Unit Caps (Cholecalciferol) .... Take 2 by mouth qd 13)  Meloxicam 15 Mg Tabs (Meloxicam) .[name].. 1 by mouth once daily for pain x 7 days, then as needed 14)  Chantix 1 Mg Tabs (Varenicline tartrate) .... Take 1 by mouth once daily  Patient Instructions: 1)  it was good to see you today.  2)  we'll make referral for MRI brain. Also for physical therpay for your neck and  back. Our office will contact you regarding these appointments once made.  3)  test(s) ordered today - your results will be posted on the phone tree for review in 48-72 hours from the time of test completion; call [phone] and enter your 9 digit MRN (listed above on this page, just below your name); if any changes need to be made or there are abnormal results, you will be contacted directly.  4)  Please schedule a follow-up appointment in 3 months, sooner if problems.    CT Scan  Procedure date:  08/16/2009  Findings:      Exam Type: CT Head without contrast Impression: 1. Scattered bialteral subcortical areas of hypoattenuation are greater than expected. the findings is nonspecific but can be seen in the setting of chronic microvascular ischemia, a demyelinating process such as multiple sclerosis, vasculitis, complicated migraines headaches, or sequelae of prior infectious 2. No acute abnormalities   CXR  Procedure date:  08/16/2009  Findings:      Impression: 1. Stable chronic interstitial coarsing 2. No acute cardiopulmonary disease

## 2010-06-17 NOTE — Miscellaneous (Signed)
Summary: Discharge Summary / [facility]  Discharge Summary / [facility]   Imported By: Lennie Odor 11/15/2009 12:17:41  _____________________________________________________________________  External Attachment:    Type:   Image     Comment:   External Document

## 2010-06-19 NOTE — Assessment & Plan Note (Signed)
Summary: 3-4 MTH FU---STC   Vital Signs:  Patient profile:   65 year old female Menstrual status:  postmenopausal Height:      65.5 inches (166.37 cm) Weight:      144.8 pounds (65.82 kg) O2 Sat:      97 % on Room air Temp:     98.8 degrees F (37.11 degrees C) oral Pulse rate:   99 / minute BP sitting:   110 / 82  (left arm) Cuff size:   large  Vitals Entered By: Orlan Leavens RMA (May 30, 2010 10:50 AM)  O2 Flow:  Room air CC: 3 month follow-up Is Patient Diabetic? No Pain Assessment Patient in pain? no      Comments Pt c/o of cough, coughing up phlegm x's 2 days. Want to have cxr done   Primary Care Provider:  Rene Paci, MD  CC:  3 month follow-up.  History of Present Illness: here for f/u  hx right sided shoulder and arm pain - similar pain symptoms to when she had neck and back problems - operated on in past by Nsurg (2004) chronic right hand numbness hx -  reports injury to nerve with ABG lab check requiring surg in 1990s - subsquent disablitiy from injury and numbness - but no weakness min relief with ultram, ?other pain pill pain exac by activity but present constantly -even at rest or lying down saw NSurg Elsner for same in 02/2009 - notes reviewed -  had complete relief of pain for months following steroid shot in knee and shoulder  HTN - follows with dr. Sharyn Lull no recent change in meds - has "been good like it is for over 5 years"- recent noncompliance due to financial stressors - ran out of medications  dyslipidemia hx - on crestor for years -reports compliance with ongoing medical treatment and no changes in medication dose or frequency. denies adverse side effects related to current therapy.   asthma - reports compliance with ongoing medical treatment and no changes in medication dose or frequency. denies adverse side effects related to current therapy.  flare ongoing due to recent URI; c/o cont cough - ?cxr or abx needed - no fever, occ AM  sputum, no CP or edema; +wheeze at night and with exertion   Clinical Review Panels:  Lipid Management   Cholesterol:  173 (09/27/2009)   LDL (bad choesterol):  105 (09/27/2009)   HDL (good cholesterol):  53.20 (09/27/2009)  CBC   WBC:  11.4 (08/16/2009)   RBC:  4.15 (08/16/2009)   Hgb:  14.1 (03/10/2010)   Hct:  43.4 (03/10/2010)   Platelets:  171 (08/16/2009)   MCV  95.7 (08/16/2009)   MCHC  32.4 (10/25/2008)   RDW  12.9 (08/16/2009)   PMN:  64 (08/16/2009)   Monos:  6 (08/16/2009)   Eosinophils:  3 (08/16/2009)   Basophil:  0.3 (08/16/2009)  Complete Metabolic Panel   Glucose:  83 (08/16/2009)   Sodium:  145 (08/16/2009)   Potassium:  3.9 (08/16/2009)   Chloride:  109 (08/16/2009)   CO2:  30 (08/16/2009)   BUN:  17 (08/16/2009)   Creatinine:  1.5 (08/16/2009)   SGPT (ALT):  19 (07/11/2009)   Current Medications (verified): 1)  Losartan Potassium 100 Mg Tabs (Losartan Potassium) .... Take One Tab By Mouth Once Daily 2)  Singulair 10 Mg Tabs (Montelukast Sodium) .... Take One Tab By Mouth Once Daily 3)  Diltiazem Hcl Er Beads 240 Mg Xr24h-Cap (Diltiazem Hcl Er Beads) .... Take One  Tab Once Daily 4)  Tramadol Hcl 50 Mg Tabs (Tramadol Hcl) .... Take 2 Tabs By Mouth Every 12 Hours 5)  Alendronate Sodium 70 Mg Tabs (Alendronate Sodium) .... Take One Tab  Every Week 6)  Fluconazole 150 Mg Tabs (Fluconazole) .... Take One Tab By Mouth Once Daily 7)  Crestor 10 Mg Tabs (Rosuvastatin Calcium) .... Take One Tab Once Daily 8)  Spiriva Handihaler 18 Mcg Caps (Tiotropium Bromide Monohydrate) .... One Spray Once Daily 9)  Advair Diskus 250-50 Mcg/dose Aepb (Fluticasone-Salmeterol) .[name].. 1 Spray Two Times A Day 10)  Furosemide 20 Mg Tabs (Furosemide) .... Take One Tab Once Daily 11)  Allegra-D 12 Hour 60-120 Mg Xr12h-Tab (Fexofenadine-Pseudoephedrine) .[name].. 1 By Mouth Q12h As Needed For Sinus Congestion and Pressure or Headache 12)  Vitamin D3 1000 Unit Caps (Cholecalciferol) ....  Take 2 By Mouth Qd 13)  Meloxicam 15 Mg Tabs (Meloxicam) .[name].. 1 By Mouth Once Daily For Pain X 7 Days, Then As Needed  Allergies (verified): No Known Drug Allergies  Past History:  Past Medical History: asthma hypertension  dyslipidemia seasonal allergies  lumbar DDD s/p surg 2004 mod lumbar spinal stenosis - MRI 8/07    MD roster: cards - harwani Nsurg - Elsner renal - coladonato uro - [facility]yn - [facility]  Past Surgical History: decompressive laminectomy + fusion  L4-5, L5-S1 (cram) -2004 right hand surg - ?1995 right knee surg - 1990   Review of Systems  The patient denies fever, weight loss, chest pain, syncope, and abdominal pain.    Physical Exam  General:  alert, well-developed, well-nourished, and cooperative to examination.    Ears:  normal pinnae bilaterally, without erythema, swelling, or tenderness to palpation. TMs clear, without effusion, or cerumen impaction. Hearing grossly normal bilaterally  Mouth:  teeth and gums in good repair; mucous membranes moist, without lesions or ulcers. oropharynx clear without exudate, no erythema.  Lungs:  normal respiratory effort, no intercostal retractions or use of accessory muscles; normal breath sounds bilaterally - no crackles but + exp wheeze R>L.    Heart:  normal rate, regular rhythm, no murmur, and no rub. BLE without edema.   Impression & Recommendations:  Problem # 1:  ASTHMA, WITH ACUTE EXACERBATION (ICD-493.92)  ?bronchitis or residual wheeze from URI - afeb, check cxr now tx with pred pak, consider abx if abn cxr  Her updated medication list for this problem includes:    Singulair 10 Mg Tabs (Montelukast sodium) .[name]... Take one tab by mouth once daily    Spiriva Handihaler 18 Mcg Caps (Tiotropium bromide monohydrate) ..... One spray once daily    Advair Diskus 250-50 Mcg/dose Aepb (Fluticasone-salmeterol) .[name]... 1 spray two times a day    Prednisone (pak) 10 Mg Tabs (Prednisone) .[name]... As  directed x 12d  Pulmonary Functions Reviewed: O2 sat: 97 (05/30/2010)  Orders: Prescription Created Electronically [id number]) T-2 View CXR (71020TC)  Problem # 2:  HYPERTENSION (ICD-401.9)  Her updated medication list for this problem includes:    Losartan Potassium 100 Mg Tabs (Losartan potassium) .[name]... Take one tab by mouth once daily    Diltiazem Hcl Er Beads 240 Mg Xr24h-cap (Diltiazem hcl er beads) .[name]... Take one tab once daily    Furosemide 20 Mg Tabs (Furosemide) .[name]... Take one tab once daily  BP today: 110/82 Prior BP: 142/98 (01/24/2010)  Labs Reviewed: K+: 3.9 (08/16/2009) Creat: : 1.5 (08/16/2009)   Chol: 173 (09/27/2009)   HDL: 53.20 (09/27/2009)   LDL: 105 (09/27/2009)  TG: 73.0 (09/27/2009)  Problem # 3:  DYSLIPIDEMIA (ICD-272.4)  Her updated medication list for this problem includes:    Crestor 10 Mg Tabs (Rosuvastatin calcium) .[name]... Take one tab once daily  Labs Reviewed: SGPT: 19 (07/11/2009)   HDL:53.20 (09/27/2009)  LDL:105 (09/27/2009)  Chol:173 (09/27/2009)  Trig:73.0 (09/27/2009)  Complete Medication List: 1)  Losartan Potassium 100 Mg Tabs (Losartan potassium) .... Take one tab by mouth once daily 2)  Singulair 10 Mg Tabs (Montelukast sodium) .... Take one tab by mouth once daily 3)  Diltiazem Hcl Er Beads 240 Mg Xr24h-cap (Diltiazem hcl er beads) .... Take one tab once daily 4)  Tramadol Hcl 50 Mg Tabs (Tramadol hcl) .... Take 2 tabs by mouth every 12 hours 5)  Alendronate Sodium 70 Mg Tabs (Alendronate sodium) .... Take one tab  every week 6)  Fluconazole 150 Mg Tabs (Fluconazole) .... Take one tab by mouth once daily 7)  Crestor 10 Mg Tabs (Rosuvastatin calcium) .... Take one tab once daily 8)  Spiriva Handihaler 18 Mcg Caps (Tiotropium bromide monohydrate) .... One spray once daily 9)  Advair Diskus 250-50 Mcg/dose Aepb (Fluticasone-salmeterol) .[name].. 1 spray two times a day 10)  Furosemide 20 Mg Tabs (Furosemide) .... Take one tab once daily 11)   Allegra-d 12 Hour 60-120 Mg Xr12h-tab (Fexofenadine-pseudoephedrine) .[name].. 1 by mouth q12h as needed for sinus congestion and pressure or headache 12)  Vitamin D3 1000 Unit Caps (Cholecalciferol) .... Take 2 by mouth qd 13)  Meloxicam 15 Mg Tabs (Meloxicam) .[name].. 1 by mouth once daily for pain x 7 days, then as needed 14)  Prednisone (pak) 10 Mg Tabs (Prednisone) .... As directed x 12d  Patient Instructions: 1)  it was good to see you today.  2)  chest xray today - your results will be called to you after review - if antibioitcs are needed you will be notified at that time 3)  pred pak for wheeze and cough - your prescription has been electronically submitted to your pharmacy. Please take as directed. Contact our office if you believe you're having problems with the medication(s).  4)  no other medication changes 5)  will send copy of note and xray to dr, Sharyn Lull 6)  Please schedule a follow-up appointment in 3-4 months for blood pressure check, call sooner if problems.  Prescriptions: PREDNISONE (PAK) 10 MG TABS (PREDNISONE) as directed x 12d  #1 x 0   Entered and Authorized by:   Newt Lukes MD   Signed by:   Newt Lukes MD on 05/30/2010   Method used:   Electronically to        [facility] [address] [phone]* (retail)       [address]       [place], [place]  [address]       Ph: [phone]       Fax: [phone]   RxID:   [id number]    Orders Added: 1)  Est. Patient Level IV [44010] 2)  Prescription Created Electronically [[id number]] 3)  T-2 View CXR [71020TC]

## 2010-06-25 NOTE — Miscellaneous (Signed)
Summary: Fl-2 Form / [facility]  Fl-2 Form / [facility]   Imported By: Lennie Odor 06/17/2010 15:12:52  _____________________________________________________________________  External Attachment:    Type:   Image     Comment:   External Document

## 2010-06-26 ENCOUNTER — Encounter: Payer: Self-pay | Admitting: Internal Medicine

## 2010-06-27 ENCOUNTER — Encounter: Payer: Self-pay | Admitting: Internal Medicine

## 2010-06-27 ENCOUNTER — Telehealth: Payer: Self-pay | Admitting: Internal Medicine

## 2010-07-03 NOTE — Progress Notes (Signed)
Summary: u/s  Phone Note Call from Patient Call back at Home Phone [phone]   Caller: Patient Call For: Newt Lukes MD Summary of Call: Pt called requesting cxr results. Let pt know i spoke with her on 05/29/10 concerning report. Gave results again. Also pt states she ask md to look at her u/s she had done on her pelvic area. Per pt md states she would look at report and let her know what she thought. Pls advise Initial call taken by: Orlan Leavens RMA,  June 27, 2010 3:07 PM  Follow-up for Phone Call        only pelvic US in system was 12/2008 - shows a fibroid on right side near ovary - no other problems - if pt has concerns, i will be happy to refer to another gyn for opinion on this matter as i am not an expecrt in gyn issues -  Follow-up by: Newt Lukes MD,  June 27, 2010 3:16 PM  Additional Follow-up for Phone Call Additional follow up Details #1::        Notified pt with md response. does not want new referral Additional Follow-up by: Orlan Leavens RMA,  June 27, 2010 4:14 PM

## 2010-07-15 NOTE — Letter (Signed)
Summary: Irena Cords MD  Irena Cords MD   Imported By: [place] 07/08/2010 09:38:36  _____________________________________________________________________  External Attachment:    Type:   Image     Comment:   External Document

## 2010-07-30 LAB — WET PREP, GENITAL

## 2010-08-06 ENCOUNTER — Encounter: Payer: Self-pay | Admitting: *Deleted

## 2010-08-06 LAB — CBC
MCHC: 34.1 g/dL (ref 30.0–36.0)
MCV: 95.7 fL (ref 78.0–100.0)
Platelets: 171 10*3/uL (ref 150–400)
RDW: 12.9 % (ref 11.5–15.5)

## 2010-08-06 LAB — DIFFERENTIAL
Eosinophils Relative: 3 % (ref 0–5)
Lymphocytes Relative: 27 % (ref 12–46)
Lymphs Abs: 3.1 10*3/uL (ref 0.7–4.0)
Monocytes Absolute: 0.7 10*3/uL (ref 0.1–1.0)

## 2010-08-06 LAB — URINALYSIS, ROUTINE W REFLEX MICROSCOPIC
Bilirubin Urine: NEGATIVE
Leukocytes, UA: NEGATIVE
Nitrite: NEGATIVE
Specific Gravity, Urine: 1.022 (ref 1.005–1.030)
pH: 6.5 (ref 5.0–8.0)

## 2010-08-06 LAB — URINE MICROSCOPIC-ADD ON

## 2010-08-06 LAB — POCT I-STAT, CHEM 8
BUN: 17 mg/dL (ref 6–23)
Calcium, Ion: 1.3 mmol/L (ref 1.12–1.32)
Creatinine, Ser: 1.5 mg/dL — ABNORMAL HIGH (ref 0.4–1.2)
TCO2: 30 mmol/L (ref 0–100)

## 2010-08-15 ENCOUNTER — Other Ambulatory Visit: Payer: Self-pay | Admitting: Internal Medicine

## 2010-08-25 LAB — POCT URINALYSIS DIP (DEVICE)
Nitrite: NEGATIVE
Protein, ur: 30 mg/dL — AB
Urobilinogen, UA: 0.2 mg/dL (ref 0.0–1.0)
pH: 5 (ref 5.0–8.0)

## 2010-08-25 LAB — CBC
MCHC: 34.9 g/dL (ref 30.0–36.0)
MCV: 94.9 fL (ref 78.0–100.0)
Platelets: 165 10*3/uL (ref 150–400)

## 2010-08-25 LAB — SEDIMENTATION RATE: Sed Rate: 17 mm/hr (ref 0–22)

## 2010-08-27 LAB — WET PREP, GENITAL

## 2010-09-01 LAB — WET PREP, GENITAL: Clue Cells Wet Prep HPF POC: NONE SEEN

## 2010-09-11 ENCOUNTER — Telehealth: Payer: Self-pay | Admitting: Internal Medicine

## 2010-09-11 ENCOUNTER — Other Ambulatory Visit (INDEPENDENT_AMBULATORY_CARE_PROVIDER_SITE_OTHER): Payer: Medicare Other

## 2010-09-11 ENCOUNTER — Ambulatory Visit (INDEPENDENT_AMBULATORY_CARE_PROVIDER_SITE_OTHER): Payer: Medicare Other | Admitting: Internal Medicine

## 2010-09-11 ENCOUNTER — Other Ambulatory Visit (INDEPENDENT_AMBULATORY_CARE_PROVIDER_SITE_OTHER): Payer: Medicare Other | Admitting: Internal Medicine

## 2010-09-11 ENCOUNTER — Encounter: Payer: Self-pay | Admitting: Internal Medicine

## 2010-09-11 ENCOUNTER — Other Ambulatory Visit: Payer: Medicare Other

## 2010-09-11 DIAGNOSIS — R5381 Other malaise: Secondary | ICD-10-CM

## 2010-09-11 DIAGNOSIS — Z79899 Other long term (current) drug therapy: Secondary | ICD-10-CM

## 2010-09-11 DIAGNOSIS — E785 Hyperlipidemia, unspecified: Secondary | ICD-10-CM

## 2010-09-11 DIAGNOSIS — R5383 Other fatigue: Secondary | ICD-10-CM

## 2010-09-11 DIAGNOSIS — I10 Essential (primary) hypertension: Secondary | ICD-10-CM

## 2010-09-11 DIAGNOSIS — R51 Headache: Secondary | ICD-10-CM

## 2010-09-11 LAB — LIPID PANEL
Cholesterol: 159 mg/dL (ref 0–200)
LDL Cholesterol: 88 mg/dL (ref 0–99)

## 2010-09-11 LAB — VITAMIN B12: Vitamin B-12: 504 pg/mL (ref 211–911)

## 2010-09-11 NOTE — Assessment & Plan Note (Signed)
Chronic, exac by NSAID overuse Reminded of need to "detox" from [facility] - no more than 3x/week! Declined to prescribe any med stronger than tramadol

## 2010-09-11 NOTE — Assessment & Plan Note (Signed)
On crestor - tolerating well - check lipids annually The current medical regimen is effective;  continue present plan and medications.

## 2010-09-11 NOTE — Assessment & Plan Note (Signed)
The current medical regimen is effective;  continue present plan and medications. BP Readings from Last 3 Encounters:  09/11/10 112/72  05/30/10 110/82  01/24/10 142/98

## 2010-09-11 NOTE — Patient Instructions (Signed)
It was good to see you today. Medications reviewed, no changes at this time. Cut back on the BCs - no more than 3x/week!!! Prefer NONE Test(s) ordered today. Your results will be called to you after review (48-72hours after test completion). If any changes need to be made, you will be notified at that time. Please schedule followup in 4-6 months, call sooner if problems.

## 2010-09-11 NOTE — Telephone Encounter (Signed)
Please call patient - normal results. No medication changes recommended. Thanks.    

## 2010-09-11 NOTE — Progress Notes (Signed)
  Subjective:    Patient ID: [name], female    DOB: [birth year], 65 y.o.   MRN: [id number]  HPI  here for follow up   hx right sided shoulder and arm pain - similar pain symptoms to when she had neck and back problems - operated on in past by Nsurg (2004) chronic right hand numbness hx -  reports injury to nerve with ABG lab check requiring surg in 1990s - subsquent disablitiy from injury and numbness - but no weakness saw NSurg [facility] for same in 02/2009 - notes reviewed -  had complete relief of pain for months following steroid shot in knee and shoulder  HTN - follows with dr. Sharyn Lull no recent change in meds - has "been good like it is for over 5 years"- recent noncompliance due to financial stressors - ran out of medications  dyslipidemia hx - on crestor for years -reports compliance with ongoing medical treatment and no changes in medication dose or frequency. denies adverse side effects related to current therapy.   Asthma/COPD - continued tobacco but working on quitting - uses Spiriva, advair and singular -reports compliance with ongoing medical treatment and no changes in medication dose or frequency. denies adverse side effects related to current therapy.   chronic HAs - takes BCs 3-4xday - no relief with tramadol  Past Medical History  Diagnosis Date  . TOBACCO ABUSE   . SPINAL STENOSIS, LUMBAR     MRI 12/2005; surg 2004  . ALLERGIC RHINITIS   . ASTHMA, UNSPECIFIED, UNSPECIFIED STATUS   . HYPERTENSION   . DYSLIPIDEMIA      Review of Systems  Constitutional: Negative for fever and unexpected weight change.  Respiratory: Negative for cough and shortness of breath.   Cardiovascular: Negative for chest pain.  Neurological: Positive for headaches (chronic).       Objective:   Physical Exam  Constitutional: She is oriented to person, place, and time. She appears well-developed and well-nourished.  Cardiovascular: Normal rate, regular rhythm and normal  heart sounds.   Pulmonary/Chest: Effort normal and breath sounds normal. No respiratory distress. She has no wheezes.  Neurological: She is alert and oriented to person, place, and time.  Psychiatric: She has a normal mood and affect. Her behavior is normal.  BP 112/72  Pulse 97  Temp(Src) 98.2 F (36.8 C) (Oral)  Ht 5' 5.5" (1.664 m)  Wt 150 lb 6.4 oz (68.221 kg)  BMI 24.65 kg/m2  SpO2 98% Lab Results  Component Value Date   WBC 11.4* 08/17/2009   HGB 14.1 03/10/2010   HCT 43.4 03/10/2010   PLT 171 08/17/2009   CHOL 173 09/27/2009   TRIG 73.0 09/27/2009   HDL 53.20 09/27/2009   ALT 19 07/11/2009   NA 145 08/17/2009   K 3.9 08/17/2009   CL 109 08/17/2009   CREATININE 1.5* 08/17/2009   BUN 17 08/17/2009   CO2 30 08/16/2009   TSH 0.691 10/25/2008       Assessment & Plan:  See problem list. Medications and labs reviewed today.

## 2010-09-12 NOTE — Telephone Encounter (Signed)
Pt Notified with lab results.Marland KitchenMarland Kitchen4/27/12@2 :16pm/LMB

## 2010-09-30 NOTE — Group Therapy Note (Signed)
NAME:  [name], [name] NO.:  [id number]   MEDICAL RECORD NO.:  [id number]          PATIENT TYPE:  WOC   LOCATION:  [facility]                   FACILITY:  [facility]   PHYSICIAN:  Ginger Carne, MD DATE OF BIRTH:  1946/01/29   DATE OF SERVICE:  01/06/2007                                  CLINIC NOTE   The patient is a 65 year old, African-American female here for routine  gynecological evaluation.  She complains of slight discharge which is  irritating, white in color, but otherwise no specific complaints.  She  has had no complaints of vaginal bleeding or menopausal symptoms and she  had a mammogram today.   PHYSICAL EXAM:  VITAL SIGNS:  Per hospital chart record.  HEENT:  Grossly normal.  BREAST:  Without masses, thickenings, discharge or tenderness.  CHEST:  Clear to percussion and auscultation.  CARDIOVASCULAR:  Without murmurs or enlargements, regular rate and  rhythm.  EXTREMITIES/LYMPHATIC/SKIN/NEUROLOGICAL/ MUSCULOSKELETAL:  Within normal  limits.  ABDOMEN:  Soft without gross hepatosplenomegaly.  PELVIC:  External genitalia, vulva, and vagina normal.  Cervix smooth  without erosions or lesions.  Slight white vaginal discharge sent for  wet prep. Uterus small, anteverted and flexed.   IMPRESSION:  Normal gynecological evaluation with vaginal irritated  discharge.   PLAN:  Wet prep and pending results will treated as needed.           ______________________________  Ginger Carne, MD     SHB/MEDQ  D:  01/06/2007  T:  01/07/2007  Job:  [id number]

## 2010-09-30 NOTE — Group Therapy Note (Signed)
NAME:  [name], [name] NO.:  [id number]   MEDICAL RECORD NO.:  [id number]          PATIENT TYPE:  WOC   LOCATION:  [facility]                   FACILITY:  [facility]   PHYSICIAN:  Allie Bossier, MD        DATE OF BIRTH:  [birth year]   DATE OF SERVICE:                                  CLINIC NOTE   [name] is a 65 year old African American female here for routine  gynecologic evaluation and annual Pap smear.  She complains of a white  vaginal discharge.  Denies itching.  Denies vaginal bleeding.  No  systemic symptoms, including fever and chills.  She is not sexually  active at this time.  Has not been sexually active for the past 10  years.  Went through menopause and had her last menstrual period at age  53, ten years ago.   PAST MEDICAL HISTORY/ACTIVE MEDICAL PROBLEMS:  1. Hypertension.  2. Asthma.  3. Hypercholesterolemia.   Those are managed by her PCP, Dr. Cleatis Polka.   She has a history of back surgery.   MEDICATIONS:  She takes three blood pressure medications.  She does not  know the name of these medications.  She also takes Singulair and  albuterol for asthma.  She takes Crestor for hyperlipidemia.  Dulcolax  and Tylenol p.r.n.  Multivitamins.  Today added Vagifem 25 mcg 2 times  weekly per vagina.   ALLERGIES:  No known drug allergies.   HEALTH MAINTENANCE:  She had her mammogram last week with normal  results.  Has had annual Pap smears.  No history of abnormal Pap's.  She  has never had a colonoscopy.   SOCIAL HISTORY:  She does smoke.  She smokes a half pack per day x70  years.  No alcohol, no drug use.  No sexual activity.  She is on  disability after a back injury several years ago.   FAMILY HISTORY:  No history of breast cancer, uterine cancer, cervix  cancer, ovarian cancer, or colon cancer.   REVIEW OF SYSTEMS:  Negative.   PHYSICAL EXAMINATION:  Temperature 97.3, heart rate 98, blood pressure  117/81, weight 146.6 pounds, 66.5 kg.   Height is 63.5 inches.  GENERAL:  She is well-appearing, pleasant, in no acute distress.  HEENT:  Grossly normal.  BREASTS:  Without masses, thickenings, discharge, or tenderness.  CHEST:  Regular rate and rhythm.  No murmurs, rubs or gallops.  PULMONARY:  Lungs are clear to auscultation bilaterally.  EXTREMITIES:  Within normal limits.  ABDOMEN:  Soft, nontender, nondistended.  No hepatosplenomegaly.  PELVIC:  External genitalia, vulva, and vagina are normal but notably  somewhat atrophic.  The cervix was stenotic.  There was a slight white  vaginal discharge that was sent for wet prep.  The uterus was of normal  size.   ASSESSMENT:  A 65 year old woman presenting for routine gynecologic  care.  1. She does appear to have significant atrophic vaginitis.  We will      prescribe Vagifem 25 mcg to be applied per vagina two times weekly.  2. Health maintenance.  She is urged to continue to  do self-breast      exams once per month.  Continue with annual mammograms and Pap      smears.  She will also be referred for colonoscopy today.  3. White discharge:  This does not appear to be grossly a yeast      infection but a sample will be sent for wet prep and will follow      results with patient.      Allie Bossier, MD    ______________________________  Allie Bossier, MD    MCD/MEDQ  D:  02/01/2008  T:  02/02/2008  Job:  [id number]

## 2010-09-30 NOTE — Group Therapy Note (Signed)
NAME:  [name], WIRE NO.:  [id number]   MEDICAL RECORD NO.:  [id number]          PATIENT TYPE:  WOC   LOCATION:  [facility]                   FACILITY:  [facility]   PHYSICIAN:  Catalina Antigua, MD     DATE OF BIRTH:  [birth year]   DATE OF SERVICE:  01/10/2009                                  CLINIC NOTE   This is a 65 year old African American female postmenopausal for 10  years, who presented to clinic for followup of her result.  The patient  was previously complaining of lower pelvic pain and upon workup,  ultrasound was performed which demonstrated an enlarged right ovary.  The patient presented on December 25, 2008, for followup repeat  ultrasound, which demonstrated a right ovary measuring 1.8 x 0.9 x 1.3  cm and the left ovary measuring 2.1 x 1 x 1.2 cm and a 2.3 x 2.5 x 1.9  cm pedunculated fibroid in the broad ligament region on the right side,  which is thought to represent the previously enlarged right ovary.  The  patient is currently asymptomatic; not complaining of any pelvic pain,  abnormal bleeding, or abnormal discharge.   All laboratory values were reviewed including normal CA-125, normal B12,  and normal H and H with the patient.  The patient enquired about a DEXA  scan, which is not inappropriate given her at least 10-year history of  postmenopausal state.  The patient is scheduled for a mammogram in 2-3  weeks.   In terms of the plan, the patient should return in approximately 3 weeks  for annual exam.           ______________________________  Catalina Antigua, MD     PC/MEDQ  D:  01/10/2009  T:  01/11/2009  Job:  [id number]

## 2010-09-30 NOTE — Group Therapy Note (Signed)
NAME:  [name], [name] NO.:  [id number]   MEDICAL RECORD NO.:  [id number]          PATIENT TYPE:  WOC   LOCATION:  [facility]                   FACILITY:  [facility]   PHYSICIAN:  Argentina Donovan, MD        DATE OF BIRTH:  1946-01-03   DATE OF SERVICE:  10/18/2008                                  CLINIC NOTE   The patient is a 66 year old African American female who went through  menopause at the age of 57 who comes in complaining of some slight  dysuria, vaginal discharge.  She has not been sexually active for 10  years.  The last time she was in and had her Pap smear in September 2009  she was treated for atrophic vaginitis, Pap smear was normal.  She is  not taking estrogen any more.  Since last in, she had a colonoscopy and  wanted to now be retreated for HIV because she has not had a test in 10  years.  She also complains of vague abdominal pain, pelvic pain.   EXAMINATION:  ABDOMEN:  Soft, flat, nontender.  No masses or  organomegaly.  External genitalia is normal.  BUS within normal limits.  The vagina is clean, well rugated and no evidence of atrophy.  Wet prep,  however, was taken and the bimanual revealed the uterus to be small,  anterior, of normal consistency.  Adnexa could not be well outlined but  there did not seem to be any masses there.   Because of the pelvic pain, we are going to get an ultrasound.  I am  going to get a culture of her urine since she shows some blood in the  urine and some protein.  If the urine culture comes back negative, will  refer her to the urologist for further evaluation.  She wanted a  prescription for immunization for shingles, which I will give her, and a  wet prep was taken and if any abnormalities there will give the patient  a call.   IMPRESSION:  Vague history of low abdominal discomfort, mild dysuria and  vaginal discharge.           ______________________________  Argentina Donovan, MD     PR/MEDQ  D:  10/18/2008  T:   10/18/2008  Job:  [id number]

## 2010-09-30 NOTE — Group Therapy Note (Signed)
NAME:  [name], [name] NO.:  [id number]   MEDICAL RECORD NO.:  [id number]          PATIENT TYPE:  WOC   LOCATION:  [facility]                   FACILITY:  [facility]   PHYSICIAN:  Argentina Donovan, MD        DATE OF BIRTH:  06-18-1945   DATE OF SERVICE:  10/25/2008                                  CLINIC NOTE   The patient is a 65 year old African American female, who went through  menopause about 10 years ago, came in, was previously treated for  atrophic vaginitis.  Pap smear was normal.  She is complaining of vague  mid lower abdominal pain, suprapubic specific area.  With symptoms, it  sounded very much like she could have interstitial cystitis.  Because of  the pain, we got an ultrasound.  She had a culture of blood and urine,  says her dip had showed some blood and protein.  If the pain persisted,  we are going to have her see the urologist.  Culture came back negative.  The pain has gone away.  She has had a positive clue cells and a wet  prep, was treated with Flagyl.  She is back today in followup after  having a pelvic ultrasound, which was normal with the exception of an  ovary on the right side that appeared to be the size that it should have  been premenopausally, rarely seen that large after menopause and no sign  of any complex cysts or other abnormalities.  We are going to follow  that up with CA-125 and a repeat ultrasound in 6 weeks.  At the present  time, the patient is asymptomatic in regard to her pelvis, but she has  been bothered by severe fatigue.  She says that she seems to have no  signs of depression and requested evaluation of that especially B12.  She tells me her friends __________  so we are going to get a level on  that and a CBC, as well as a thyroid evaluation.  We will see her back  in 10 weeks after her next ultrasound and will call her if her labs  including CA-125 are abnormal.           ______________________________  Argentina Donovan,  MD     PR/MEDQ  D:  10/25/2008  T:  10/26/2008  Job:  [id number]

## 2010-09-30 NOTE — Discharge Summary (Signed)
[name], [name]              ACCOUNT NO.:  [id number]   MEDICAL RECORD NO.:  [id number]          PATIENT TYPE:  INP   LOCATION:  6743                         FACILITY:  [facility]   PHYSICIAN:  Mohan N. Sharyn Lull, M.D. DATE OF BIRTH:  September 03, 1945   DATE OF ADMISSION:  06/07/2007  DATE OF DISCHARGE:  06/13/2007                               DISCHARGE SUMMARY   ADMITTING DIAGNOSES:  1. Exacerbation of asthmatic bronchitis, rule out pneumonia.  2. Uncontrolled hypertension secondary to noncompliance.  3. Elevated blood sugar rule out diabetes mellitus.  4. Hypercholesteremia.  5. Degenerative joint disease.  6. History of mild renal insufficiency.  7. Migraine headache.   FINAL DIAGNOSES:  1. Status post asthmatic bronchitis.  2. Hypertension.  3. Glucose intolerance.  4. Hypercholesteremia.  5. Degenerative joint disease.  6. History of mild renal insufficiency in the past.  7. Migraine headache.   DISCHARGE MEDICATIONS:  1. Cardizem CD 240 mg one tablet one capsule twice daily.  2. Avapro 300 mg one tablet daily.  3. Prednisone 10 mg one tablet daily for 5 days and then half tablet      daily for 5 days.  4. Advair discus 250/50 one puff twice daily.  5. Spiriva 18 mcg one puff daily.  6. Xanax 0.25 mg one tablet twice daily.  7. Avelox 400 mg one tablet daily.   DISCHARGE INSTRUCTIONS:  1. Diet:  Low salt, low cholesterol.  The patient has been advised to      avoid sweets.  2. Activity as tolerated.  3. Follow-up with me in one week.   CONDITION AT DISCHARGE:  Stable.   BRIEF HISTORY AND HOSPITAL COURSE:  [name] is a 65 year old black  female with past medical history significant for hypertension, history  of bronchial asthma, mild renal insufficiency, degenerative joint  disease, hypercholesteremia, migraine headache, came to the ER  complaining of cough with chills and yellowish phlegm associated with  problems of increasing shortness of breath and wheezing  for last 2-3  days.  States she ran out of all BP and asthma medication.  Denies any  chest pain, nausea, vomiting or diaphoresis.  Denies palpitation,  lightheadedness or syncope.  The patient was noted to be in moderate  respiratory distress requiring multiple breathing treatments with  minimal improvement in her wheezing and breathing.   PAST MEDICAL HISTORY:  As above.   PAST SURGICAL HISTORY:  1. Back surgery in the past.  2. Right knee surgery in the past.  3. Right carpal tunnel surgery in the past.   ALLERGIES:  NO KNOWN DRUG ALLERGIES.   MEDICATION AT HOME:  She is on Avalide, Cardizem, singular, Advair and  albuterol inhalers   SOCIAL HISTORY:  She is single, has four children.  Smokes cigarettes  occasionally.  No history of alcohol or drug abuse.   FAMILY HISTORY:  Noncontributory.   PHYSICAL EXAMINATION:  GENERAL:  She is awake, in mild respiratory  distress.  VITAL SIGNS:  Blood pressure was 146/85, pulse was 120, sinus tach on  the monitor.  HEENT:  Conjunctivae was pink.  NECK: Supple,  no JVD, no bruit.  LUNGS: Throat.  She had mild congestion.  There was no exudate.  LUNGS:  She has bilateral expiratory wheezing.  CARDIOVASCULAR:  S1, S2 was normal.  There was soft systolic murmur.  No  S3, gallop.  ABDOMEN:  Soft.  Bowel sounds were present, nontender.  EXTREMITIES: There is no clubbing, cyanosis or edema.   LABORATORY DATA:  hemoglobin was 13.6, hematocrit 40.3, white count of  13.4, potassium was 3.4, BUN 18, creatinine was 1.34.  , glucose was  141.  Chest x-ray showed good chronic and bronchiectatic changes.  No  acute pulmonary findings.  Her bilateral hip x-ray showed mild  degenerative joint disease.  No history of osteoarthritis.  Her repeat  fasting blood sugar was 106, BUN was 22, creatinine 1.39 hemoglobin A1c  was 5.6 with cholesterol 160, LDL 109, HDL 40, triglycerides 54.   BRIEF HOSPITAL COURSE:  The patient was admitted and admitted to   telemetry unit.  The patient was started on IV antibiotics and IV  steroids with improvement in her breathing.  The patient was continued  on beta agonist.  IV steroids were slowly tapered off and was switched  to p.o. prednisone.  The patient remained afebrile during the hospital  stay.  The patient IV antibiotics were changed to p.o. Avelox.  The  patient has been ambulating in hallway without any problems with minimal  aspirate wheezing with good respiratory effort, good air entry with no  evidence of respiratory distress.  The patient will be discharged home  on above medications and will be followed up in my office in one week      Mohan N. Sharyn Lull, M.D.  Electronically Signed    MNH/MEDQ  D:  06/13/2007  T:  06/13/2007  Job:  [id number]

## 2010-10-03 NOTE — Discharge Summary (Signed)
[name]              ACCOUNT NO.:  [id number]   MEDICAL RECORD NO.:  [id number]          PATIENT TYPE:  WOC   LOCATION:  [facility]                          FACILITY:  [facility]   PHYSICIAN:  Mackenzie Scott, MD     DATE OF BIRTH:  Dec 31, 1945   DATE OF ADMISSION:  01/23/2006  DATE OF DISCHARGE:  01/23/2006                                 DISCHARGE SUMMARY   PRIMARY CARE PHYSICIAN:  Dr. Sharyn Davis.   DISCHARGE DIAGNOSES:  1. Asthma +/- chronic obstructive pulmonary disease acute exacerbation.  2. Hyperglycemia secondary to steroids.  3. Hypertension.  4. Hyperlipidemia.  5. Chronic kidney disease.  6. Right upper lobe nodule on chest x-ray.  7. Hypokalemia.   DISCHARGE MEDICATIONS:  1. Prednisone taper.  Patient being discharged on 60 mg p.o. daily and to      be tapered by 10 mg every other day until she reaches 0 mg.  2. Atrovent inhaler 17 mcg per spray metered dose inhaler, 2 puffs inhaled      2 times daily.  3. Albuterol inhaler 90 mcg per spray MDI, 2 puffs q.4-6 hourly p.r.n.  4. Singulair 10 mg p.o. daily.  5. Advair Diskus 100/50, 1 puff inhaled twice daily.  6. Diltiazem 360 mg p.o. daily.  7. Vytorin 10/20, 1 tablet p.o. daily.  8. Avalide 300/12.5, 1 tablet p.o. daily.  9. Claritin 10 mg p.o. daily.  10.Robitussin-DM which is 10 mL p.o. twice a day.   PROCEDURES DONE DURING THIS ADMISSION:  1. Chest x-ray which was done on January 18, 2006 impression:      a.     Chronic obstructive pulmonary disease, no acute infiltrate.      b.     Small right upper lobe pulmonary nodule opacity.  pulmonary       nodule cannot be excluded.  Chest CT is recommended for further       evaluation.  This was thought to be secondary to nipple shadows.  2. A second chest x-ray obtained on the 5th of September 2007 and the      impression was there was no acute findings and the nodular density seen      in the right upper lobe on previous exam was difficult to identify.   PATIENT  CONDITION AT DISCHARGE:  Patient has shown significant improvement  in her respiratory status since admission for her asthmatic exacerbation, is  doing quite well, able to ambulate without oxygen, maintaining good  saturations without any symptoms and is stable for discharge.   For the detail history and physical examination findings please refer note  by Dr. Elliot Davis done on the 3rd of September 2007.   HOSPITAL COURSE:  1. For the acute asthma +/- COPD exacerbation.  Patient with a past      medical history of asthma/COPD stated that over the past year she had      to come to the emergency department for asthma attacks about 4 or 5      times, prior to that the asthma attacks were well controlled and 2 days  prior to admission her dyspnea and wheezing became progressively worse      and she had to use her Advair inhaler multiple times during the day and      she also was taking Singulair.  She quit smoking 3 weeks prior to      admission and claims that her symptoms have gotten worse with the      discontinuing of the smoking.  Apart from that she denies any features      of upper respiratory tract infection.  She was assessed as having an      acute exacerbation of her asthma/COPD, was admitted to the floor, was      started on intravenous methylprednisolone and bronchodilator nebulizer      treatment with albuterol, Atrovent and oxygen therapy.  She was also      placed on Singulair, Advair Diskus, Robitussin syrup as well as      Claritin.  Patient made a gradual but definite improvement in her      symptomatology and currently is asymptomatic of chest pain, also her      breathing improved and she is been able to maintain good oxygen      saturations without oxygen supplements.  She was switched to p.o.      prednisone 2 days back and continues to make a good recovery.  She is      stable for discharge and has been advised to continue with the steroid      taper, albuterol  and atrovent inhalers and she is to see the primary      care physician next week at his office after making an appointment, who      may consider a pulmonary consult as outpatient for her frequent      exacerbations. She has been advised to avoid smoking or any triggers      that precipitate her attacks.  She has also been advised to return to      the emergency room p.r.n. if there is a flareup of her attacks.  2. Hyperglycemia which was thought to be secondary to the steroids, was      maintained on Lantus insulin in the hospital. She has been advised diet      and exercise as an outpatient and followup with her primary care      physician for further evaluation to rule out diabetes.  3. Hypertension has been fairly controlled and she is to continue with her      Avalide at home.  4. Hyperlipidemia.  She is to continue her Vytorin.  5. Chronic kidney disease has been stable in hospital and a renal consult      was suggested as an outpatient which has to be followed up by the      primary care physician.  6. Hypokalemia was corrected by potassium supplements in the hospital.  7. Chest x-ray finding of her lung nodule on the first x-ray was not      confirmatory on the second one. May have to be followed with repeat      chest XRay.  8. L3, L4 stenosis - listhesis.  Currently asymptomatic and stable.   Cc Dr Mackenzie Sharps, MD  Electronically Signed     AH/MEDQ  D:  01/23/2006  T:  01/24/2006  Job:  [id number]   cc:   [name]. [name], M.D.

## 2010-10-03 NOTE — Group Therapy Note (Signed)
NAME:  HETTY, LINHART NO.:  [id number]   MEDICAL RECORD NO.:  [id number]          PATIENT TYPE:  WOC   LOCATION:  [facility]                   FACILITY:  [facility]   PHYSICIAN:  Dorthula Perfect, MD     DATE OF BIRTH:  1945/12/17   DATE OF SERVICE:                                    CLINIC NOTE   This 65 year old black female, gravida 4, para 4, presents for annual Pap  smear and exam. She stopped having her periods a number of years ago.  She has no vaginal bleeding at this time.   PAST MEDICAL HISTORY:  1. Patient had a mammogram 2 months ago which was normal.  2. She is on blood pressure medications.   SURGICAL HISTORY:  Back surgery.   REVIEW OF SYSTEMS:  No complaints.   PHYSICAL EXAMINATION:  VITAL SIGNS: Weight 164 pounds. Blood pressure  136/82. Height 5 feet, 4 inches.  NECK: Thyroid was normal.  BREAST EXAM: Bilaterally normal, nontender and no masses.  ABDOMEN: Soft, nontender, no masses are palpable.  PELVIC EXAM: External genitalia, BUS glands are normal. Vaginal wall was  epithelialized and is somewhat atrophic. The cervix is normal and slightly  atrophic. The uterus is in the midline, of normal size and shape to perhaps  slightly small. Ovaries are not palpable. She has no adnexal masses.  Rectovaginal exam confirms the above.   IMPRESSION:  Normal GYN exam.   DISPOSITION:  Pap smear. Patient is advised that if this Pap smear is  normal, as others have been in the past, she could start having her Pap  smears every couple of years. However she is urged to continue having annual  breast exams and mammograms.           ______________________________  Dorthula Perfect, MD     ER/MEDQ  D:  03/24/2006  T:  03/24/2006  Job:  [id number]

## 2010-10-03 NOTE — H&P (Signed)
Mackenzie [name], Mackenzie [name].:  [id number]   MEDICAL RECORD [name].:  [id number]          PATIENT TYPE:  INP   LOCATION:  1823                         FACILITY:  [facility]   PHYSICIAN:  Elliot Cousin, M.D.    DATE OF BIRTH:  [birth year]   DATE OF ADMISSION:  01/18/2006  DATE OF DISCHARGE:                                HISTORY & PHYSICAL   PRIMARY CARE PHYSICIAN:  Dr. Sharyn Lull   CHIEF COMPLAINT:  Shortness of breath, chest tightness, and wheezing.   HISTORY OF PRESENT ILLNESS:  The patient is a 65 year old lady with a past  medical history significant for asthma, hypertension, and seasonal allergies  who presents to the emergency department with a two-week history of  progressive shortness of breath, wheezing, and chest tightness.  The patient  states that over the past year she has had to come to the emergency  department for asthma attacks 4-5 times .  Previous to this year, her  asthma had been well-controlled.  Over the past two days, her shortness of  breath and wheezing became progressively worse.  She uses her albuterol  inhaler multiple times during the day, sometimes up to 10-12 times daily.  She also takes Singulair 10 mg daily.  She quit smoking 3 weeks ago.  She  equates stopping smoking with worsening asthma symptoms.  She has had a dry  cough.  [name] recent fever or chills.  [name] other upper respiratory infection  symptoms with the exception of an occasional sore throat.  She also has  seasonal allergies, particularly in the spring and fall.  She has never been  intubated or placed on a mechanical ventilator.   The patient actually presented to the [facility].  When she arrived  there, she was in moderate to severe respiratory distress.  She was treated  with 125 mg of Solu-Medrol IV and multiple nebulizers.  She was subsequently  transferred to the [facility] Emergency Department.  When the patient  arrived to the emergency department at [facility], she was  afebrile, her  blood pressure was 130/88, her pulse rate was 114, and her respiratory rate  was 25.  She was oxygenating  96% on a non-rebreather.  Her ABG on the non-  rebreather revealed a pH of 7.3, PCO2 of 49, and a PO2 of 163.  Her chest x-  ray revealed COPD.  The patient will be admitted for further evaluation and  management.   PAST MEDICAL HISTORY:  1. Asthma.  2. Hypertension.  3. Hyperlipidemia.  4. Tobacco abuse; the patient quit 3 weeks ago.  5. Seasonal allergies.  6. Lumbar degenerative joint disease and moderate-severe central stenosis      of the lumbar spine per MRI of the lumbar spine on January 15, 2006.  7. Status post decompressive laminectomy and posterior interbody fusion of      L4-L5 and L5-S1 per Dr. Wynetta Emery on March 26, 2003.  8. 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  9. Status post right knee arthroscopic surgery in the 1990's in [place].   MEDICATIONS:  1.  Avalide 300/12.5 mg daily.  2. Diltiazem 360 mg daily.  3. Singulair 10 mg daily.  4. Vytorin 10/20 mg daily.  5. Albuterol inhaler 2 puffs every 2-3 hours as needed.   ALLERGIES:  [name] known drug allergies.   SOCIAL HISTORY:  The patient is divorced.  [place]  She has four children.  She is disabled.  She stopped smoking  cigarettes three weeks ago.  Up until then, she smoked one-half pack of  cigarettes for more than 20 years.  She denies alcohol and illicit drug use.  She is a former Lawyer.   FAMILY HISTORY:  Her mother died of Alzheimer's at 81 years of age.  She  also had a history of hypertension.  Her father died secondary to murder.  He was in his 66s.   REVIEW OF SYSTEMS:  The patient's review of systems is positive for  shortness of breath, wheezing, chest tightness.  Occasional numbness and  tingling in her right hand, occasional weakness in her right hand.  Chronic  low back pain.  Otherwise, review of systems is negative.    PHYSICAL EXAMINATION:  VITAL SIGNS:  Temperature 97.3.  Blood pressure  130/88.  Pulse 114.  Respiratory rate 25.  Oxygen saturation 96% on non-  rebreather; now 95% on 3 liters.  GENERAL:  The patient is a pleasant 65 year old African-American woman who  is currently sitting up in bed in much improved condition.  HEENT:  Head is normocephalic, non-traumatic.  Pupils are equal, round, and  reactive to light.  Extraocular movements are intact. Conjunctivae are  clear.  Sclerae are white.  Tympanic membranes are clear bilaterally.  Nasal  mucosa is mildly dry.  [name] sinus tenderness.  Oropharynx reveals fair  dentition.  Mucous membranes are mildly dry.  [name] posterior exudates or  erythema.  NECK:  Supple.  [name] adenopathy.  [name] thyromegaly.  [name] bruit.  [name] JVD.  LUNGS:  The patient has mild diffuse expiratory wheezes throughout all lung  fields.  Slightly decreased breath sounds in the bases.  Her breathing now  is nonlabored.  HEART:  S1, S2 with mild tachycardia.  ABDOMEN:  Positive bowel sounds.  Soft, nontender, nondistended.  [name]  hepatosplenomegaly.  [name] masses palpated.  EXTREMITIES:  Pedal pulses are 2+ bilaterally.  [name] pretibial edema and [name]  pedal edema.  [name] acute joint findings.  NEUROLOGIC:  The patient is alert and oriented times three.  Cranial nerves  II-XII are intact.  Strength is 5/5 throughout.  Sensation is intact.   ADMISSION LABORATORIES:  EKG pending.  Chest x-ray reveals COPD.  [name] acute  infiltrate.  Right upper lobe nodule, not seen on prior x-ray; recommend CT  to exclude pulmonary nodule.  ABG results as above.  Sodium 142, potassium  2.9, chloride 112, glucose 163, BUN 20, creatinine 1.8, CO2 20.  Hemoglobin  15.3, hematocrit 45.   ASSESSMENT:  1. Status asthmaticus.  The patient appears much improved now, status post      Solu-Medrol and multiple nebulizer treatments.  Her arterial blood gas     reveals mild carbon dioxide retention.  Hopefully, over the next 24       hours the respiratory acidosis will resolve.  Now that she is much      improved, she will be admitted to a telemetry bed rather than a step-      down bed.  2. Hypokalemia.  The patient's serum potassium is 2.9.  The hypokalemia is      probably secondary to both steroid therapy and albuterol therapy.  3. Renal insufficiency.  The patient's baseline renal function is unknown.      Currently her creatinine is 1.8 and her BUN is 20.  She is chronically      treated with Avalide for hypertension.  In light of the elevated      creatinine, the Avalide will be withheld for now.  4. Hyperglycemia.  The patient's venous glucose is 163.  The patient has      [name] history of diabetes mellitus.  The hyperglycemia may be a      consequence Solu-Medrol therapy.  A hemoglobin A1c will need to be      assessed.  5. Right upper lobe nodule and chronic obstructive pulmonary disease per      chest x-ray.  The right upper lobe nodule may be a nipple shadow.      Rather than ordering a CT scan of the chest, we will repeat a PA and      lateral chest x-ray with nipple markers.  6. Hypertension.  7. Hyperlipidemia.   PLAN:  1. The patient will be admitted to telemetry.  2. We will continue steroid therapy with Solu-Medrol 80 mg IV q 6 hours.  3. We will also continue nebulizer treatments with albuterol and Atrovent      ever 4 hours, then every 2 hours p.r.n.  4. Continue Singulair.  Add Advair Diskus as well as Claritin.  5. We will give magnesium sulfate 500 mg IV times one.  6. We will repeat a chest x-ray, PA and lateral with nipple markers, to      assess the nodule  7. Replete potassium chloride.  8. Cover elevated blood sugars with a sliding scale insulin regimen and      Lantus.  Check a hemoglobin A1c  9. Continue blood pressure and hyperlipidemic medications as previously      prescribed with the exception of Avalide.  10.Gentle IV fluids.  11.Follow renal function closely.   The patient's  primary care physician is Dr. Sharyn Lull.  Dr. Algie Coffer was  informed that the patient is being admitted under [facility].  The plan is to transfer the patient to Dr. Sharyn Lull tomorrow.      Elliot Cousin, M.D.  Electronically Signed     DF/MEDQ  D:  01/19/2006  T:  01/19/2006  Job:  [id number]   cc:   Eduardo Osier. Sharyn Lull, M.D.

## 2010-10-03 NOTE — Op Note (Signed)
NAME:  [name], [name]                        ACCOUNT NO.:  [id number]   MEDICAL RECORD NO.:  [id number]                   PATIENT TYPE:  INP   LOCATION:  2899                                 FACILITY:  [facility]   PHYSICIAN:  Donalee Citrin, M.D.                     DATE OF BIRTH:  Sep 29, 1945   DATE OF PROCEDURE:  03/26/2003  DATE OF DISCHARGE:                                 OPERATIVE REPORT   PREOPERATIVE DIAGNOSES:  Lumbar spinal stenosis, L4-5 and L5-S1 from grade 2  spondylolisthesis L4-5 and severe  degenerative disk disease and collapse,  facet arthropathy and spinal stenosis L5-S1.   POSTOPERATIVE DIAGNOSES:  Lumbar spinal stenosis, L4-5 and L5-S1 from grade  2 spondylolisthesis L4-5 and severe degenerative disk disease and collapse,  facet arthropathy and spinal stenosis L5-S1.   PROCEDURE:  Decompressive laminectomy L4-5 and L5-S1, posterior lumbar  interbody fusion L4-5 and L5-S1. Pedicle screw fixation L4-5 and L5-S1 using  the M8 Legacy pedicle screw system with 65 x 40 pedicle screws at L4 and L5  and 65 x 35 at S1. Posterolateral arthrodesis L5 and S1. Open reduction of  spinal deformity L4-5 and placement of Hemovac drain.   SURGEON:  Donalee Citrin, M.D.   ANESTHESIA:  General endotracheal.   HISTORY OF PRESENT ILLNESS:  The patient is a very pleasant 65 year old  female  who has had longstanding back and leg pain bilaterally radiating  down the top of the foot and the big toe as well as in the anterior shin  down the same distribution. The patient was refractory to conservative  treatment, has been in and out of the emergency room requiring escalating  doses of narcotics and increased sedentary lifestyle. Preoperative imaging  showed severe  spinal stenosis at L4-5 and L5-S1 with a grade 2  spondylolisthesis with bilateral pars defects at L4-5 and then  severe  collapse and degeneration at L5-S1 with a central disk bulge. The patient  was recommended decompressive  stabilization procedure. I discussion the  risks and benefits of the surgery with her and she understands and agrees to  proceed forward.   DESCRIPTION OF PROCEDURE:  The patient was prepped and draped for back  surgery in the usual sterile fashion  and placed on the Wilson frame. After  needle localization of the L4-5 disk  space a midline  incision was made  after infiltrating with 10 mL of lidocaine with epinephrine. Bovie  electrocautery was used to dissect the subcutaneous tissue and the  subperiosteal tissues until the lamina of L4 and L5.   The laminar complex at L4 was noted to be hypermobile as well as L5 was  noted to be collapsed down and the laminar complex at L4 was telescoped on  the laminar  complex of L5. Then after exposing the TPs laterally, a self-  retaining retractor was placed. The Leksell rongeur was used to  remove the  spinous process at L4 and L5 as well as part of the lamina. Then using a 3  and 4-mm Kerrison punch, the __________  of the limb of L5 was removed,  exposing the ligament of Flavum  thecal sac. Then using a combination of 3  and 4-mm Kerrison punch and Leksell rongeur the radical medial facetectomies  were completed at L4-5 and S1 and the L5 nerve root was identified.   Then attention was taken for identification of the L4 nerve root. This was  noted to be coming off rather higher as well as a steep dorsal trajectory.  It was identified against the pedicle of L4 and rapidly decompressed at the  neural foramen. This completed the [place] part of the procedure at L4-5 and  both the L4, L5 and S1 nerve roots were rapidly decompressed at the neural  foramen and the entire medial facet complex had been removed at the TC  spaces.   Then the D'Errico nerve root retractor was used to reflect the right L5  nerve root medially. An annulotomy was made and there was noted to be a  large, hypertrophied osteophyte coming off L5. This was overbitten with a 3-  mm  Kerrison punch to enable entry into the interspace. The interspace was  entered and cannulated, then a 9 distractor was inserted and the D'Errico  nerve root retractor was used to reflect the left nerve root medially and a  10 distractor. After the interspace was then cleaned out the 10 distractor  was inserted.   Then using the size 10 cutter and chisel, the disk space was then radically  cleaned out on the right side. This also achieved about 50% reduction of her  spondylolisthesis, converting her to approximately a grade 1. Then a 10 x 27  mm Tangent allograft was inserted. Again fluoroscopy  confirmed the depth  checked with each step along the way. The endplates were scraped and  prepared to receive the bone graft and a 10 x 25 mm Tangent allograft was  inserted on the right side.   Then on the left side again the interior sac was cleaned out. Then using the  size 10 cutter and chisel the endplates were prepared to receive the bone  graft. The central endplates were scraped. The locally harvested allograft  was packed against the allograft on the right side and a 10 x 26 mm Tangent  allograft was inserted on the left side. Both allografts were approximately  1 to  2 mm deep. The posterior  vertebral alignment confirmed good position  by fluoroscopy and both L4 and L5 nerve roots were protected and noted to be  well decompressed at their foramen with reduction.   Then attention was turned to L5-S1 and the procedure was repeated with 8 x  25 mm Tangent allograft inserted on the right side as well as on the left,  with locally harvested allograft packed between  the 2. Again fluoroscopy  confirmed good trajectory and depth at each step along the way. After all 4  Tangent allografts were inserted, attention was taken to pedicle screw  placement.   The wound was copiously irrigated. Pilot holes were drilled at the L4 pedicle on the right side. The initial pass did break through  medially.  This was redirected with less medial trajectory and straight down laterally,  and this was noted to be competent with direct intracanicular exploration  with a probe as well as direct intracanicular  inspection. Then a 65 x 45  pedicle screw was inserted at L5 on the right. This procedure was repeated  at L5 and then a 65 x 35 at S1. Again fluoroscopy confirmed good position  and trajectory. All pedicles were explored from within the canal as well as  from within the pedicle and noted to be competent in the 360 degree  orientation.   Then the L3 pedicle screws were again inserted on the left side. After all 6  pedicle screws were in place, the wound was copiously irrigated. Aggressive  decortication was carried out in the lateral TPs. The facet complexes were  entered and locally harvested allograft was packed in the lateral gutters  along the TPs and facet complexes from L4-5 and L5-S1.   Then a 6-mm rod was size selected and additionally lordosed. This was nicely  tightened down at S1 and the L5 pedicle screw was compressed against S1 and  the L4 was compressed against L5. Then a 322 cross link was inserted. After  all hardware had been placed, postoperative fluoroscopy confirmed good  position  of rods, screws and bone graft. Then Gelfoam was laid on top of  the dura after reexploration of the neural foramen to ensure that no graft  material had migrated and they were noted to be widely patent. Gelfoam was  overlaid over top of the dura. A Hemovac drain was placed.   The muscle and fascia were reapproximated with 0 interrupted Vicryl. The  subcutaneous tissue was reapproximated with 2-0 interrupted Vicryl. The skin  was closed with running 4-0 silk, Benzoin and Steri-Strips.   The patient was brought to the recovery room in stable condition. All  sponge, instrument and needle counts were correct at the end of the case.                                               Donalee Citrin, M.D.    GC/MEDQ  D:  03/26/2003  T:  03/26/2003  Job:  [id number]
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
## 2010-10-31 ENCOUNTER — Telehealth: Payer: Self-pay | Admitting: *Deleted

## 2010-10-31 NOTE — Telephone Encounter (Signed)
Pt called regarding an pelvic U/S that pt has previously discussed with MD earlier this year-pt states that MD had stated that U/S had showed pt had a condition or scarring but she could not remember the name of the condition and wanted MD's advisement on what condition the U/S showed-pt aware MD is out of office until Monday

## 2010-11-03 NOTE — Telephone Encounter (Signed)
Pt requested a copy of [place] be mailed to her home, address verified.

## 2010-11-03 NOTE — Telephone Encounter (Signed)
12/2008 pelvic US showed fibroid - no other abnormalities

## 2010-11-03 NOTE — Telephone Encounter (Signed)
Left message on machine for pt to return my call  

## 2010-11-13 ENCOUNTER — Ambulatory Visit: Payer: Medicare Other | Admitting: Physician Assistant

## 2011-02-03 ENCOUNTER — Emergency Department (HOSPITAL_COMMUNITY): Payer: Medicare Other

## 2011-02-03 ENCOUNTER — Inpatient Hospital Stay (HOSPITAL_COMMUNITY)
Admission: EM | Admit: 2011-02-03 | Discharge: 2011-02-16 | DRG: 163 | Disposition: A | Discharge status: Home / Self Care | Payer: Medicare Other | Attending: Cardiothoracic Surgery | Admitting: Cardiothoracic Surgery

## 2011-02-03 DIAGNOSIS — M549 Dorsalgia, unspecified: Secondary | ICD-10-CM | POA: Diagnosis present

## 2011-02-03 DIAGNOSIS — Z96659 Presence of unspecified artificial knee joint: Secondary | ICD-10-CM

## 2011-02-03 DIAGNOSIS — J4489 Other specified chronic obstructive pulmonary disease: Secondary | ICD-10-CM | POA: Diagnosis present

## 2011-02-03 DIAGNOSIS — J189 Pneumonia, unspecified organism: Principal | ICD-10-CM | POA: Diagnosis present

## 2011-02-03 DIAGNOSIS — J90 Pleural effusion, not elsewhere classified: Secondary | ICD-10-CM | POA: Diagnosis present

## 2011-02-03 DIAGNOSIS — E785 Hyperlipidemia, unspecified: Secondary | ICD-10-CM | POA: Diagnosis present

## 2011-02-03 DIAGNOSIS — F29 Unspecified psychosis not due to a substance or known physiological condition: Secondary | ICD-10-CM | POA: Diagnosis not present

## 2011-02-03 DIAGNOSIS — I10 Essential (primary) hypertension: Secondary | ICD-10-CM | POA: Diagnosis present

## 2011-02-03 DIAGNOSIS — Z79899 Other long term (current) drug therapy: Secondary | ICD-10-CM

## 2011-02-03 DIAGNOSIS — N289 Disorder of kidney and ureter, unspecified: Secondary | ICD-10-CM | POA: Diagnosis present

## 2011-02-03 DIAGNOSIS — G8929 Other chronic pain: Secondary | ICD-10-CM | POA: Diagnosis present

## 2011-02-03 DIAGNOSIS — Z87891 Personal history of nicotine dependence: Secondary | ICD-10-CM

## 2011-02-03 DIAGNOSIS — J449 Chronic obstructive pulmonary disease, unspecified: Secondary | ICD-10-CM | POA: Diagnosis present

## 2011-02-03 DIAGNOSIS — J860 Pyothorax with fistula: Secondary | ICD-10-CM | POA: Diagnosis present

## 2011-02-03 LAB — DIFFERENTIAL
Basophils Absolute: 0 10*3/uL (ref 0.0–0.1)
Basophils Relative: 0 % (ref 0–1)
Eosinophils Absolute: 0 10*3/uL (ref 0.0–0.7)
Lymphs Abs: 1.7 10*3/uL (ref 0.7–4.0)
Neutrophils Relative %: 84 % — ABNORMAL HIGH (ref 43–77)

## 2011-02-03 LAB — COMPREHENSIVE METABOLIC PANEL
ALT: 8 U/L (ref 0–35)
AST: 13 U/L (ref 0–37)
Alkaline Phosphatase: 67 U/L (ref 39–117)
CO2: 27 mEq/L (ref 19–32)
Chloride: 105 mEq/L (ref 96–112)
GFR calc non Af Amer: 46 mL/min — ABNORMAL LOW (ref >60)
Potassium: 3.5 mEq/L (ref 3.5–5.1)
Sodium: 140 mEq/L (ref 135–145)
Total Bilirubin: 0.5 mg/dL (ref 0.3–1.2)

## 2011-02-03 LAB — CBC
MCV: 92.9 fL (ref 78.0–100.0)
Platelets: 159 10*3/uL (ref 150–400)
RBC: 4.36 MIL/uL (ref 3.87–5.11)
WBC: 20.2 10*3/uL — ABNORMAL HIGH (ref 4.0–10.5)

## 2011-02-03 LAB — PROCALCITONIN: Procalcitonin: 1.19 ng/mL

## 2011-02-03 MED ORDER — IOHEXOL 300 MG/ML  SOLN
100.0000 mL | Freq: Once | INTRAMUSCULAR | Status: AC | PRN
  Administered 2011-02-03: 100 mL via INTRAVENOUS

## 2011-02-04 LAB — CBC
HCT: 39.1 % (ref 36.0–46.0)
Hemoglobin: 13.2 g/dL (ref 12.0–15.0)
MCH: 31.5 pg (ref 26.0–34.0)
MCHC: 33.8 g/dL (ref 30.0–36.0)
MCV: 93.3 fL (ref 78.0–100.0)
Platelets: 157 10*3/uL (ref 150–400)
RBC: 4.19 MIL/uL (ref 3.87–5.11)
RDW: 13.7 % (ref 11.5–15.5)
WBC: 21.5 10*3/uL — ABNORMAL HIGH (ref 4.0–10.5)

## 2011-02-04 LAB — BASIC METABOLIC PANEL
BUN: 12 mg/dL (ref 6–23)
Chloride: 103 mEq/L (ref 96–112)
Creatinine, Ser: 1.18 mg/dL — ABNORMAL HIGH (ref 0.50–1.10)
GFR calc Af Amer: 56 mL/min — ABNORMAL LOW (ref >60)
GFR calc non Af Amer: 46 mL/min — ABNORMAL LOW (ref >60)

## 2011-02-05 LAB — BASIC METABOLIC PANEL
BUN: 13 mg/dL (ref 6–23)
BUN: 22
BUN: 22
CO2: 27 mEq/L (ref 19–32)
CO2: 26
Calcium: 10 mg/dL (ref 8.4–10.5)
Chloride: 103 mEq/L (ref 96–112)
Chloride: 105
Creatinine, Ser: 1.13 mg/dL — ABNORMAL HIGH (ref 0.50–1.10)
Creatinine, Ser: 1.39 — ABNORMAL HIGH
GFR calc Af Amer: 59 mL/min — ABNORMAL LOW (ref >60)
GFR calc non Af Amer: 49 mL/min — ABNORMAL LOW (ref >60)
GFR calc non Af Amer: 42 — ABNORMAL LOW
Glucose, Bld: 96 mg/dL (ref 70–99)
Glucose, Bld: 106 — ABNORMAL HIGH
Glucose, Bld: 117 — ABNORMAL HIGH
Potassium: 3.6 mEq/L (ref 3.5–5.1)
Potassium: 4.5
Sodium: 139 mEq/L (ref 135–145)

## 2011-02-05 LAB — CBC
HCT: 37.4 % (ref 36.0–46.0)
HCT: 37.2
HCT: 40.3
Hemoglobin: 12.6 g/dL (ref 12.0–15.0)
Hemoglobin: 13.6
MCH: 31.4 pg (ref 26.0–34.0)
MCHC: 33.7 g/dL (ref 30.0–36.0)
MCHC: 33.7
MCHC: 33.9
MCV: 93.3 fL (ref 78.0–100.0)
MCV: 93.9
MCV: 94.5
Platelets: 149 10*3/uL — ABNORMAL LOW (ref 150–400)
Platelets: 189
Platelets: 191
Platelets: 203
Platelets: 207
RBC: 4.01 MIL/uL (ref 3.87–5.11)
RBC: 4.29
RDW: 13.4 % (ref 11.5–15.5)
RDW: 13.7
RDW: 14
RDW: 14.1
WBC: 17.5 10*3/uL — ABNORMAL HIGH (ref 4.0–10.5)
WBC: 11.6 — ABNORMAL HIGH
WBC: 13.4 — ABNORMAL HIGH

## 2011-02-05 LAB — DIFFERENTIAL
Basophils Absolute: 0
Basophils Relative: 0
Eosinophils Absolute: 0.6
Eosinophils Relative: 4
Lymphocytes Relative: 21
Lymphs Abs: 2.8
Monocytes Absolute: 0.4
Monocytes Relative: 3
Neutro Abs: 9.6 — ABNORMAL HIGH
Neutrophils Relative %: 71

## 2011-02-05 LAB — LIPID PANEL
HDL: 40
Total CHOL/HDL Ratio: 4
Triglycerides: 54
VLDL: 11

## 2011-02-05 LAB — COMPREHENSIVE METABOLIC PANEL
ALT: 14
AST: 22
Albumin: 3.3 — ABNORMAL LOW
Alkaline Phosphatase: 71
Chloride: 108
GFR calc Af Amer: 47 — ABNORMAL LOW
Potassium: 2.9 — ABNORMAL LOW
Total Bilirubin: 0.6

## 2011-02-05 LAB — POCT I-STAT CREATININE
Creatinine, Ser: 1.5 — ABNORMAL HIGH
Operator id: 282201

## 2011-02-05 LAB — HEMOGLOBIN A1C: Hgb A1c MFr Bld: 5.6

## 2011-02-05 LAB — I-STAT 8, (EC8 V) (CONVERTED LAB)
Chloride: 110
Glucose, Bld: 141 — ABNORMAL HIGH
HCT: 43
Potassium: 3.4 — ABNORMAL LOW
Sodium: 143
TCO2: 26

## 2011-02-05 LAB — CULTURE, RESPIRATORY W GRAM STAIN

## 2011-02-05 LAB — EXPECTORATED SPUTUM ASSESSMENT W GRAM STAIN, RFLX TO RESP C

## 2011-02-06 ENCOUNTER — Inpatient Hospital Stay (HOSPITAL_COMMUNITY): Payer: Medicare Other

## 2011-02-07 ENCOUNTER — Inpatient Hospital Stay (HOSPITAL_COMMUNITY): Payer: Medicare Other

## 2011-02-07 LAB — BASIC METABOLIC PANEL
BUN: 11 mg/dL (ref 6–23)
CO2: 26 mEq/L (ref 19–32)
Chloride: 102 mEq/L (ref 96–112)
Creatinine, Ser: 1.34 mg/dL — ABNORMAL HIGH (ref 0.50–1.10)
Glucose, Bld: 81 mg/dL (ref 70–99)

## 2011-02-07 LAB — CBC
HCT: 35.3 % — ABNORMAL LOW (ref 36.0–46.0)
Hemoglobin: 11.9 g/dL — ABNORMAL LOW (ref 12.0–15.0)
MCV: 90.1 fL (ref 78.0–100.0)
RBC: 3.92 MIL/uL (ref 3.87–5.11)
WBC: 12.8 10*3/uL — ABNORMAL HIGH (ref 4.0–10.5)

## 2011-02-08 LAB — BASIC METABOLIC PANEL
CO2: 27 mEq/L (ref 19–32)
Calcium: 10 mg/dL (ref 8.4–10.5)
Creatinine, Ser: 1.4 mg/dL — ABNORMAL HIGH (ref 0.50–1.10)
Glucose, Bld: 108 mg/dL — ABNORMAL HIGH (ref 70–99)

## 2011-02-09 LAB — CULTURE, BLOOD (ROUTINE X 2)
Culture  Setup Time: 201209182229
Culture: NO GROWTH

## 2011-02-09 LAB — CBC
HCT: 36.2 % (ref 36.0–46.0)
Hemoglobin: 12.3 g/dL (ref 12.0–15.0)
MCHC: 34 g/dL (ref 30.0–36.0)
MCV: 91.6 fL (ref 78.0–100.0)
RDW: 13.6 % (ref 11.5–15.5)

## 2011-02-09 LAB — BASIC METABOLIC PANEL
BUN: 13 mg/dL (ref 6–23)
Chloride: 100 mEq/L (ref 96–112)
Creatinine, Ser: 1.59 mg/dL — ABNORMAL HIGH (ref 0.50–1.10)
GFR calc Af Amer: 40 mL/min — ABNORMAL LOW (ref >60)
GFR calc non Af Amer: 33 mL/min — ABNORMAL LOW (ref >60)
Glucose, Bld: 99 mg/dL (ref 70–99)

## 2011-02-10 ENCOUNTER — Inpatient Hospital Stay (HOSPITAL_COMMUNITY): Payer: Medicare Other

## 2011-02-10 DIAGNOSIS — J13 Pneumonia due to Streptococcus pneumoniae: Secondary | ICD-10-CM

## 2011-02-10 DIAGNOSIS — J90 Pleural effusion, not elsewhere classified: Secondary | ICD-10-CM

## 2011-02-10 LAB — APTT: aPTT: 33 seconds (ref 24–37)

## 2011-02-11 ENCOUNTER — Inpatient Hospital Stay (HOSPITAL_COMMUNITY): Payer: Medicare Other

## 2011-02-11 DIAGNOSIS — J90 Pleural effusion, not elsewhere classified: Secondary | ICD-10-CM

## 2011-02-11 LAB — STREP PNEUMONIAE URINARY ANTIGEN: Strep Pneumo Urinary Antigen: NEGATIVE

## 2011-02-11 LAB — ABO/RH: ABO/RH(D): O POS

## 2011-02-12 ENCOUNTER — Other Ambulatory Visit: Payer: Self-pay | Admitting: Cardiothoracic Surgery

## 2011-02-12 ENCOUNTER — Inpatient Hospital Stay (HOSPITAL_COMMUNITY): Payer: Medicare Other

## 2011-02-12 DIAGNOSIS — J90 Pleural effusion, not elsewhere classified: Secondary | ICD-10-CM

## 2011-02-12 DIAGNOSIS — J13 Pneumonia due to Streptococcus pneumoniae: Secondary | ICD-10-CM

## 2011-02-12 HISTORY — PX: OTHER SURGICAL HISTORY: SHX169

## 2011-02-12 LAB — BLOOD GAS, ARTERIAL
Acid-Base Excess: 1.5 mmol/L (ref 0.0–2.0)
Bicarbonate: 27 mEq/L — ABNORMAL HIGH (ref 20.0–24.0)
O2 Content: 10 L/min
O2 Saturation: 95.7 %
Patient temperature: 98.6
TCO2: 28.6 mmol/L (ref 0–100)
pCO2 arterial: 53.7 mmHg — ABNORMAL HIGH (ref 35.0–45.0)
pH, Arterial: 7.321 — ABNORMAL LOW (ref 7.350–7.400)
pO2, Arterial: 92.8 mmHg (ref 80.0–100.0)

## 2011-02-12 LAB — COMPREHENSIVE METABOLIC PANEL
Albumin: 3.2 g/dL — ABNORMAL LOW (ref 3.5–5.2)
BUN: 20 mg/dL (ref 6–23)
Calcium: 10.9 mg/dL — ABNORMAL HIGH (ref 8.4–10.5)
Creatinine, Ser: 2.01 mg/dL — ABNORMAL HIGH (ref 0.50–1.10)
GFR calc Af Amer: 30 mL/min — ABNORMAL LOW (ref >60)
Glucose, Bld: 101 mg/dL — ABNORMAL HIGH (ref 70–99)
Total Protein: 8.9 g/dL — ABNORMAL HIGH (ref 6.0–8.3)

## 2011-02-12 LAB — POCT I-STAT 3, ART BLOOD GAS (G3+)
O2 Saturation: 96 %
Patient temperature: 99.1
TCO2: 30 mmol/L (ref 0–100)
pCO2 arterial: 49.6 mmHg — ABNORMAL HIGH (ref 35.0–45.0)

## 2011-02-12 LAB — CBC
HCT: 40.2 % (ref 36.0–46.0)
Hemoglobin: 13.5 g/dL (ref 12.0–15.0)
MCH: 31.5 pg (ref 26.0–34.0)
MCHC: 33.6 g/dL (ref 30.0–36.0)
MCV: 93.9 fL (ref 78.0–100.0)
RDW: 13.7 % (ref 11.5–15.5)

## 2011-02-12 LAB — POCT I-STAT, CHEM 8
Calcium, Ion: 1.29 mmol/L (ref 1.12–1.32)
HCT: 38 % (ref 36.0–46.0)
Hemoglobin: 12.9 g/dL (ref 12.0–15.0)
Sodium: 138 mEq/L (ref 135–145)
TCO2: 26 mmol/L (ref 0–100)

## 2011-02-12 LAB — SURGICAL PCR SCREEN
MRSA, PCR: NEGATIVE
Staphylococcus aureus: NEGATIVE

## 2011-02-13 ENCOUNTER — Inpatient Hospital Stay (HOSPITAL_COMMUNITY): Payer: Medicare Other

## 2011-02-13 LAB — CBC
HCT: 34.6 % — ABNORMAL LOW (ref 36.0–46.0)
Hemoglobin: 11.3 g/dL — ABNORMAL LOW (ref 12.0–15.0)
MCH: 30.8 pg (ref 26.0–34.0)
MCHC: 32.7 g/dL (ref 30.0–36.0)
MCV: 94.3 fL (ref 78.0–100.0)
Platelets: 308 10*3/uL (ref 150–400)
RBC: 3.67 MIL/uL — ABNORMAL LOW (ref 3.87–5.11)
RDW: 13.7 % (ref 11.5–15.5)
WBC: 21.2 10*3/uL — ABNORMAL HIGH (ref 4.0–10.5)

## 2011-02-13 LAB — POCT I-STAT 3, ART BLOOD GAS (G3+)
Patient temperature: 99.7
pCO2 arterial: 45 mmHg (ref 35.0–45.0)
pH, Arterial: 7.398 (ref 7.350–7.400)

## 2011-02-13 LAB — BASIC METABOLIC PANEL
BUN: 16 mg/dL (ref 6–23)
CO2: 28 mEq/L (ref 19–32)
Calcium: 9.9 mg/dL (ref 8.4–10.5)
Chloride: 103 mEq/L (ref 96–112)
Creatinine, Ser: 1.52 mg/dL — ABNORMAL HIGH (ref 0.50–1.10)
GFR calc Af Amer: 42 mL/min — ABNORMAL LOW (ref >60)
GFR calc non Af Amer: 35 mL/min — ABNORMAL LOW (ref >60)
Glucose, Bld: 131 mg/dL — ABNORMAL HIGH (ref 70–99)
Potassium: 4.3 mEq/L (ref 3.5–5.1)
Sodium: 138 mEq/L (ref 135–145)

## 2011-02-13 LAB — GLUCOSE, CAPILLARY: Glucose-Capillary: 154 mg/dL — ABNORMAL HIGH (ref 70–99)

## 2011-02-14 ENCOUNTER — Inpatient Hospital Stay (HOSPITAL_COMMUNITY): Payer: Medicare Other

## 2011-02-14 LAB — GLUCOSE, CAPILLARY
Glucose-Capillary: 197 mg/dL — ABNORMAL HIGH (ref 70–99)
Glucose-Capillary: 101 mg/dL — ABNORMAL HIGH (ref 70–99)

## 2011-02-14 LAB — COMPREHENSIVE METABOLIC PANEL
ALT: 13 U/L (ref 0–35)
AST: 18 U/L (ref 0–37)
Albumin: 2.3 g/dL — ABNORMAL LOW (ref 3.5–5.2)
Alkaline Phosphatase: 53 U/L (ref 39–117)
BUN: 15 mg/dL (ref 6–23)
CO2: 30 mEq/L (ref 19–32)
Calcium: 10 mg/dL (ref 8.4–10.5)
Chloride: 100 mEq/L (ref 96–112)
Creatinine, Ser: 1.81 mg/dL — ABNORMAL HIGH (ref 0.50–1.10)
GFR calc Af Amer: 34 mL/min — ABNORMAL LOW (ref >60)
GFR calc non Af Amer: 28 mL/min — ABNORMAL LOW (ref >60)
Glucose, Bld: 103 mg/dL — ABNORMAL HIGH (ref 70–99)
Potassium: 4 mEq/L (ref 3.5–5.1)
Sodium: 137 mEq/L (ref 135–145)
Total Bilirubin: 0.3 mg/dL (ref 0.3–1.2)
Total Protein: 7.2 g/dL (ref 6.0–8.3)

## 2011-02-14 LAB — CBC
HCT: 34.3 % — ABNORMAL LOW (ref 36.0–46.0)
Hemoglobin: 11.2 g/dL — ABNORMAL LOW (ref 12.0–15.0)
MCH: 31 pg (ref 26.0–34.0)
MCHC: 32.7 g/dL (ref 30.0–36.0)
MCV: 95 fL (ref 78.0–100.0)
Platelets: 323 10*3/uL (ref 150–400)
RBC: 3.61 MIL/uL — ABNORMAL LOW (ref 3.87–5.11)
RDW: 13.7 % (ref 11.5–15.5)
WBC: 17.1 10*3/uL — ABNORMAL HIGH (ref 4.0–10.5)

## 2011-02-15 ENCOUNTER — Inpatient Hospital Stay (HOSPITAL_COMMUNITY): Payer: Medicare Other

## 2011-02-15 LAB — CROSSMATCH
ABO/RH(D): O POS
Antibody Screen: NEGATIVE
Unit division: 0

## 2011-02-15 LAB — GLUCOSE, CAPILLARY
Glucose-Capillary: 119 mg/dL — ABNORMAL HIGH (ref 70–99)
Glucose-Capillary: 103 mg/dL — ABNORMAL HIGH (ref 70–99)

## 2011-02-16 ENCOUNTER — Inpatient Hospital Stay (HOSPITAL_COMMUNITY): Payer: Medicare Other

## 2011-02-16 LAB — CBC
HCT: 33.2 % — ABNORMAL LOW (ref 36.0–46.0)
Hemoglobin: 11 g/dL — ABNORMAL LOW (ref 12.0–15.0)
MCHC: 33.1 g/dL (ref 30.0–36.0)
RDW: 13.3 % (ref 11.5–15.5)
WBC: 12.4 10*3/uL — ABNORMAL HIGH (ref 4.0–10.5)

## 2011-02-16 LAB — GLUCOSE, CAPILLARY: Glucose-Capillary: 122 mg/dL — ABNORMAL HIGH (ref 70–99)

## 2011-02-16 LAB — BASIC METABOLIC PANEL
BUN: 12 mg/dL (ref 6–23)
Chloride: 103 mEq/L (ref 96–112)
GFR calc Af Amer: 39 mL/min — ABNORMAL LOW (ref >90)
GFR calc non Af Amer: 33 mL/min — ABNORMAL LOW (ref >90)
Glucose, Bld: 92 mg/dL (ref 70–99)
Potassium: 3.3 mEq/L — ABNORMAL LOW (ref 3.5–5.1)
Sodium: 138 mEq/L (ref 135–145)

## 2011-02-16 LAB — TISSUE CULTURE: Gram Stain: NONE SEEN

## 2011-02-17 ENCOUNTER — Encounter: Payer: Self-pay | Admitting: Cardiothoracic Surgery

## 2011-02-17 DIAGNOSIS — N182 Chronic kidney disease, stage 2 (mild): Secondary | ICD-10-CM | POA: Insufficient documentation

## 2011-02-17 DIAGNOSIS — N289 Disorder of kidney and ureter, unspecified: Secondary | ICD-10-CM

## 2011-02-17 LAB — ANAEROBIC CULTURE

## 2011-02-19 NOTE — Consult Note (Signed)
NAMEMarland Kitchen  [name], [name] NO.:  [id number]  MEDICAL RECORD NO.:  [id number]  LOCATION:  4711                         FACILITY:  [facility]  PHYSICIAN:  Kerin Perna, M.D.  DATE OF BIRTH:  Nov 23, 1945  DATE OF CONSULTATION:  02/11/2011 DATE OF DISCHARGE:                                CONSULTATION   REFERRING PHYSICIAN:  Dr. Molli Knock.  PRIMARY CARE PHYSICIAN:  Eduardo Osier. Harwani, MD  REASON FOR CONSULTATION:  Right lower lobe pneumonia with empyema.  CHIEF COMPLAINT:  Right chest pain, shortness of breath.  HISTORY OF PRESENT ILLNESS:  I was asked to evaluate this 64 year old African American female, ex-smoker, for further evaluation and treatment of recently diagnosed right empyema.  The patient presented to the hospital on February 03, 2011, complaining of progressive malaise, right pleuritic chest pain, loss of appetite, weight loss, productive cough, and low-grade fever.  She ruled out for MI and a chest x-ray showed right lower lobe pneumonia.  She was started on broad-spectrum antibiotics.  White count was moderately elevated.  Initial cultures and strep antigen was negative.  She is a nondiabetic, but she smoked up to about 3 months ago.  She does not use alcohol.  Over the course of her hospitalization, the pleural effusion on the right side has increased and the CT scan was performed, demonstrating this is to be a moderate effusion, loculated, consistent with empyema.  Attempted thoracentesis was unsuccessful and thoracic surgical consultation was requested for VATS procedure to decorticate the effusion.  PAST MEDICAL HISTORY: 1. Hypertension. 2. Mild renal insufficiency. 3. Status post right knee surgery and lumbar surgery.  ALLERGIES:  No known drug allergies.  HOME MEDICATIONS:  Crestor, Cozaar, Diltiazem, Singulair, Lasix, and Spiriva.  FAMILY HISTORY:  The patient lives alone, has 4 children.  Does not use alcohol.  Stopped smoking 3 months  ago.  FAMILY HISTORY:  Negative for diabetes.  No history of lung cancer.  REVIEW OF SYSTEMS:  The patient has been followed with CT scans since 2007 for a small 4-5-mm right lower lobe nodule.  No history of thoracic trauma.  No recent or recurrent preceding history of pneumonia.  She states she has had a Pneumovax vaccine in the past 2-3 years.  She smoked about 1 pack a day up until 3 months ago when she quit.  She denies diabetes.  She denies coronary artery disease, MI, or cardiac murmur.  She is in the sinus rhythm.  Hematologic is negative for bleeding disorder, blood transfusion.  NEUROLOGIC:  Negative for stroke or seizure.  PHYSICAL EXAMINATION:  VITAL SIGNS:  The patient is 5 feet 5 inches and weighs 64 kg.  Temperature 99, blood pressure 128/60, heart rate 90 and regular, saturation 94% on 2 liters nasal oxygen. GENERAL APPEARANCE:  This is a middle-aged [place] American female, in no acute distress. HEENT:  Normocephalic.  Pupils are equal.  Dentition is with missing teeth and some decay of teeth. NECK:  Without JVD, mass, or bruit. LYMPHATICS:  There is no palpable cervical adenopathy.  Thorax is with some tenderness along the right side with some rhonchi and diminished breath sounds at the base. CARDIAC:  Regular rhythm without  S3 gallop or murmur. ABDOMEN:  Soft and nontender without mass. EXTREMITIES:  No clubbing, cyanosis, or edema.  Peripheral pulses are 2+ and strong.  There is no calf tenderness. NEUROLOGIC:  Alert and oriented without focal motor deficits.  LABORATORY DATA:  Her white count is 15,000.  Creatinine 1.6, BUN 13, sodium 141, CO2 31.  Hematocrit 36, platelets 200,000.  Procalcitonin level elevated at 1.2 and her albumin is 3.3 with normal LFTs.  I reviewed the CT scan which shows a loculated subpulmonic empyema associated with right lower lobe infiltrate, consolidation consistent with pneumonia and empyema.  PLAN:  The patient will be  scheduled for right VATS decortication, drainage of empyema in the morning.  I discussed the procedure in detail including the use of general anesthesia, location of the surgical incision, use of postoperative chest tube drainage, and the expected postoperative recovery.  I reviewed the risks including risks of bleeding, prolonged air leak, persistent pneumonia, ventilator-dependent respiratory failure, and death.  She understands and agrees to proceed.     Kerin Perna, M.D.     PV/MEDQ  D:  02/11/2011  T:  02/11/2011  Job:  [id number]  Electronically Signed by Kerin Perna M.D. on 02/19/2011 09:31:07 AM

## 2011-02-19 NOTE — Op Note (Signed)
[name], [name] NO.:  [id number]  MEDICAL RECORD NO.:  [id number]  LOCATION:  2550                         FACILITY:  [facility]  PHYSICIAN:  Kerin Perna, M.D.  DATE OF BIRTH:  24-Dec-1945  DATE OF PROCEDURE:  02/12/2011 DATE OF DISCHARGE:                              OPERATIVE REPORT   OPERATIONS: 1. Right video-assisted thoracoscopic surgery, drainage of empyema. 2. Decortication of right lower lobe. 3. Closure of bronchopleural fistula, right lower lobe. 4. Placement of wound On-Q analgesia irrigation system.  PREOPERATIVE DIAGNOSIS:  Right lower lobe pneumonia with right empyema.  POSTOPERATIVE DIAGNOSIS:  Right lower lobe pneumonia with right empyema.  SURGEON:  Kerin Perna, MD  ASSISTANT:  Stephanie Acre. Dasovich, PA-C  ANESTHESIA:  General by Dr. Sheldon Silvan.  INDICATIONS FOR PROCEDURE:  The patient is a 65 year old African American female smoker who has been treated with oral antibiotics for a right lower lobe pneumonia which has not cleared.  She was admitted with pleuritic pain and a loculated enlarging pleural effusion which was too thick to drain with thoracentesis.  Thoracic surgical consultation for VATS decortication and drainage was requested.  I reviewed the situation with the patient and the indications and expected benefits of right VATS, drainage of empyema, and decortication. I explained the alternatives to surgical therapy as well.  I discussed the major issues of surgery including the use of general anesthesia, the location of the surgical incisions, the use of postoperative chest tube drainage system, and the expected postoperative hospital recovery.  I reviewed with her the risks to her of the operation including the risks of bleeding, prolonged air leak, blood transfusion requirement, persistent pneumonia, ventilator dependence, renal failure, and death. After reviewing these issues, she demonstrated her understanding  and agreed to proceed with the surgery as planned.  She understood that by having this procedure a chance for clearing the infection should be significantly improved.  The patient was brought to operating room and placed supine on the operating room table where general anesthesia was induced with a double- lumen endotracheal tube.  The patient was turned to expose the right chest.  A proper time-out was performed to confirm proper patient and proper site.  The right chest was prepped and draped as a sterile field. A small 1-inch incision was made at the tip of the scapula in the fifth interspace.  Through this, the pleural space was entered and a port for the thoracoscopic camera was inserted.  The camera was inserted and the thorax was surveyed.  There was a moderate loculated posterior and inferior fusion.  The right lower lobe appeared to be somewhat erythematous and indurated consistent with pneumonia.  I saw no signs of cancer.  The scope was removed and the incision was extended approximately 7-8 cm in length.  The ribs were gently spread.  Using the VATS elongated instruments, the lower lobe was mobilized off the diaphragm and the loculated fluid was removed.  The fluid was sent for cytology and culture.  The right lower lobe had a peel of visceral pleura and this was removed off the lower lobe and doing so, decortication was achieved.  There was a defect  in the visceral pleura where the bronchopleural fistula originated and this was debrided and closed with a figure-of-eight 3-0 chromic suture.  The remainder of the thorax was surveyed with a camera and pockets of fluid were drained and irrigated.  The raw areas on the surface of the right lower lobe were covered with a coat of medical adhesive (ProGel) and 2 tubes to drain in the pleural space anteriorly and posteriorly.  The small incision was closed with a single pericostal with #2 chromic around the ribs.  The muscle layers  closed in interrupted 0-Vicryl.  The subcutaneous and skin were closed in running Vicryl.  An On-Q catheter was placed beneath the main incision and above the chest tube site, flushed with 0.5% Marcaine, and connected to a Marcaine reservoir.  The patient was then extubated in the operating room and returned to the recovery room in stable condition.     Kerin Perna, M.D.     PV/MEDQ  D:  02/12/2011  T:  02/12/2011  Job:  [id number]  Electronically Signed by Kerin Perna M.D. on 02/19/2011 09:31:14 AM

## 2011-02-23 ENCOUNTER — Ambulatory Visit (INDEPENDENT_AMBULATORY_CARE_PROVIDER_SITE_OTHER): Payer: Self-pay

## 2011-02-23 DIAGNOSIS — Z09 Encounter for follow-up examination after completed treatment for conditions other than malignant neoplasm: Secondary | ICD-10-CM

## 2011-02-23 DIAGNOSIS — J90 Pleural effusion, not elsewhere classified: Secondary | ICD-10-CM

## 2011-02-23 NOTE — Progress Notes (Signed)
Removed 2 sutures at incision sites (fifth interspace). No signs of infection and pt tolerated well. S/P Rt VATS, drainage of empyema on 02/12/11. Appt sch'ed on 03/11/11 with CXR

## 2011-02-24 ENCOUNTER — Emergency Department (HOSPITAL_COMMUNITY): Payer: Medicare Other

## 2011-02-24 ENCOUNTER — Emergency Department (HOSPITAL_COMMUNITY)
Admission: EM | Admit: 2011-02-24 | Discharge: 2011-02-24 | Disposition: A | Discharge status: Home / Self Care | Payer: Medicare Other | Attending: Emergency Medicine | Admitting: Emergency Medicine

## 2011-02-24 DIAGNOSIS — Z9889 Other specified postprocedural states: Secondary | ICD-10-CM | POA: Insufficient documentation

## 2011-02-24 DIAGNOSIS — R0602 Shortness of breath: Secondary | ICD-10-CM | POA: Insufficient documentation

## 2011-02-24 DIAGNOSIS — J45909 Unspecified asthma, uncomplicated: Secondary | ICD-10-CM | POA: Insufficient documentation

## 2011-02-24 DIAGNOSIS — F411 Generalized anxiety disorder: Secondary | ICD-10-CM | POA: Insufficient documentation

## 2011-02-24 DIAGNOSIS — R0989 Other specified symptoms and signs involving the circulatory and respiratory systems: Secondary | ICD-10-CM | POA: Insufficient documentation

## 2011-02-24 DIAGNOSIS — R0609 Other forms of dyspnea: Secondary | ICD-10-CM | POA: Insufficient documentation

## 2011-02-24 DIAGNOSIS — E785 Hyperlipidemia, unspecified: Secondary | ICD-10-CM | POA: Insufficient documentation

## 2011-02-24 DIAGNOSIS — Z79899 Other long term (current) drug therapy: Secondary | ICD-10-CM | POA: Insufficient documentation

## 2011-02-24 DIAGNOSIS — I10 Essential (primary) hypertension: Secondary | ICD-10-CM | POA: Insufficient documentation

## 2011-02-24 DIAGNOSIS — F17200 Nicotine dependence, unspecified, uncomplicated: Secondary | ICD-10-CM | POA: Insufficient documentation

## 2011-02-24 NOTE — Discharge Summary (Signed)
[name], [name] NO.:  [id number]  MEDICAL RECORD NO.:  [id number]  LOCATION:  3311                         FACILITY:  [facility]  PHYSICIAN:  Kerin Perna, M.D.  DATE OF BIRTH:  [birth year]  DATE OF ADMISSION:  02/03/2011 DATE OF DISCHARGE:  02/16/2011                              DISCHARGE SUMMARY   FINAL DIAGNOSIS:  Right lower lobe pneumonia with right empyema.  SECONDARY DIAGNOSES: 1. Hypertension. 2. Mild renal insufficiency. 3. Status post right knee surgery. 4. Status post lumbar surgery.  IN-HOSPITAL OPERATIONS AND PROCEDURES:  Right video-assisted thoracoscopic surgery, drainage of empyema, decortication of right lower lobe.  Closure of bronchopleural fistula right lower lobe.  This was done by Dr. Dr. Donata Clay on February 12, 2011.  HISTORY AND PHYSICAL AND HOSPITAL COURSE:  The patient is a 65 year old female who presented to the hospital on February 03, 2011, complaining of progressive malaise, right pleuritic chest pain, loss of appetite, weight loss, productive cough, low-grade fever.  She ruled out for myocardial infarction.  Chest x-ray was obtained and showed a right lower lobe pneumonia.  The patient was felt to require admission to [facility] for management of her pneumonia.  She was admitted under Dr. Sharyn Lull on February 03, 2011.  Further details of the patient's past medical history and physical exam, please see dictated H&P.  The patient was admitted to [facility] on February 03, 2011, with diagnosis of right lower lobe pneumonia .  She was started on broad- spectrum antibiotics.  Blood cultures have been drawn and noted to be negative.  Pulmonary was consulted, saw, and evaluated the patient.  They ordered a chest CT which was done on September 25.  This showed a small to moderate-sized right pleural effusion with some loculation superiorly. The possibility of emphysema could not be ruled out.   Probable right lower lobe pneumonia with some cavitation and bronchiectasis. Also noted left upper lobe nodules.  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 Following CT scan results, Dr. Donata Clay was consulted for possible right VATS with decortication.  Dr. Donata Clay saw and evaluated the patient on February 11, 2011.  He discussed with the patient taking her to the operating room where he will do right VATS with decortication. He discussed risks and benefits with the patient.  The patient nods her understanding and agreed to proceed.  Surgery was scheduled for February 12, 2011.  The patient was taken to the operating room on February 12, 2011, where she underwent right video-assisted thoracoscopic surgery with drainage of empyema.  She had decortication right lower lobe and closure of bronchopleural fistula right lower lobe.  The patient tolerated this procedure well and was transferred to the surgical intensive care unit in stable condition.  The patient was able to be extubated following surgery.  Postextubation, neuro was intact.  She was noted to be hemodynamically stable.  Cultures had been obtained during surgery and all noted to be negative.  The patient was continued on IV antibiotics postoperatively.  Postoperatively, daily chest x-rays were obtained.  He is noted to be stable.  The patient had no air leak noted from chest tubes.  Chest tube  drainage was followed closely.  One chest tube was discontinued on postop day #2.  Remaining chest tube placed to water seal.  Chest x-ray noted to remain stable with no air leak.  Remaining chest tube was discontinued on postop day #3.  Follow-up chest x-ray on postop day #4 was stable.  During the patient's postoperative course, her vital signs were followed closely.  She remained afebrile.  She was encouraged to use her incentive spirometer and has been able to be weaned off oxygen with O2 saturations maintaining greater than 90% on room air.   She had been continued on IV antibiotics during her hospitalization, and plan to discharge the patient home on p.o. Augmentin for 14 days.  The patient postoperatively has been up ambulating with assistance.  She is tolerating diet well.  No nausea or vomiting noted.  All incisions are clean, dry, and intact and healing well.  She is seen and evaluated by Dr. Donata Clay today and was felt to be stable and ready for discharge to home.  FOLLOWUP APPOINTMENTS:  Follow-up appointments arranged with Dr. Donata Clay for March 11, 2011, at 12:30 p.m..  The patient will need to obtain PMI chest x-ray 1 hour prior to this appointment.  Suture removal with the nurse was arranged for February 23, 2011, at 9:00 a.m..  The patient will need to follow up with Dr. Sharyn Lull in the next 2 to 4 weeks.  She will need to contact his office to make these arrangements.  ACTIVITY:  Patient instructed no driving to at least to do so, no lifting over 10 pounds.  She is told to ambulate three times per day, progress as tolerated, and continue breathing exercises.  INCISIONAL CARE:  The patient is told to shower, washing her incisions using soap and water.  She is to contact the office if she develops any drainage or opening from any of her incision sites.  DIET:  The patient's again diet to be low-fat, low-salt.  DISCHARGE MEDICATIONS: 1. Augmentin 500 mg b.i.d. times 14 days. 2. Lopressor 50 mg b.i.d. 3. Percocet 5/325 one to two tablets q.4 h. p.r.n. pain. 4. Advair Diskus 250/50 one puff b.i.d. 5. Albuterol inhaler 2 puffs every 4 hours p.r.n. 6. Crestor 10 mg daily. 7. Diltiazem CD 240 mg daily. 8. Lasix 20 mg daily. 9. Losartan 100 mg daily. 10.Minoxidil 2.5 mg daily. 11.Singulair 10 mg daily. 12.Spiriva 18 mcg daily.     Sol Blazing, PA   ______________________________ Kerin Perna, M.D.   KMD/MEDQ  D:  02/16/2011  T:  02/16/2011  Job:  [id number]  Electronically Signed by Cameron Proud PA on 02/20/2011 08:57:20 AM Electronically Signed by Kerin Perna M.D. on 02/24/2011 05:28:35 PM

## 2011-02-27 ENCOUNTER — Encounter: Payer: Self-pay | Admitting: *Deleted

## 2011-03-02 LAB — POCT I-STAT 3, ART BLOOD GAS (G3+)
Bicarbonate: 25.7 — ABNORMAL HIGH
TCO2: 27
pCO2 arterial: 40.6
pH, Arterial: 7.41 — ABNORMAL HIGH
pO2, Arterial: 83

## 2011-03-02 LAB — DIFFERENTIAL
Basophils Absolute: 0
Basophils Relative: 0
Eosinophils Absolute: 0.5
Eosinophils Relative: 5
Neutrophils Relative %: 58

## 2011-03-02 LAB — CBC
HCT: 43.4
MCV: 93.4
Platelets: 223
RDW: 13.1
WBC: 9.6

## 2011-03-02 LAB — POCT I-STAT CREATININE: Operator id: 151321

## 2011-03-02 LAB — I-STAT 8, (EC8 V) (CONVERTED LAB)
BUN: 20
Bicarbonate: 29.5 — ABNORMAL HIGH
Glucose, Bld: 91
Hemoglobin: 16 — ABNORMAL HIGH
TCO2: 31
pCO2, Ven: 54.7 — ABNORMAL HIGH
pH, Ven: 7.339 — ABNORMAL HIGH

## 2011-03-02 LAB — D-DIMER, QUANTITATIVE: D-Dimer, Quant: 0.76 — ABNORMAL HIGH

## 2011-03-05 ENCOUNTER — Other Ambulatory Visit: Payer: Self-pay | Admitting: Cardiothoracic Surgery

## 2011-03-05 DIAGNOSIS — J90 Pleural effusion, not elsewhere classified: Secondary | ICD-10-CM

## 2011-03-10 ENCOUNTER — Other Ambulatory Visit: Payer: Self-pay

## 2011-03-10 NOTE — Telephone Encounter (Signed)
RX faxed to [id number]/ no refill. Appt sch'ed with Dr Donata Clay 03/11/11

## 2011-03-11 ENCOUNTER — Ambulatory Visit
Admission: RE | Admit: 2011-03-11 | Discharge: 2011-03-11 | Disposition: A | Discharge status: Home / Self Care | Payer: Medicare Other | Source: Ambulatory Visit | Attending: Cardiothoracic Surgery | Admitting: Cardiothoracic Surgery

## 2011-03-11 ENCOUNTER — Encounter: Payer: Self-pay | Admitting: Cardiothoracic Surgery

## 2011-03-11 ENCOUNTER — Ambulatory Visit (INDEPENDENT_AMBULATORY_CARE_PROVIDER_SITE_OTHER): Payer: Self-pay | Admitting: Cardiothoracic Surgery

## 2011-03-11 VITALS — BP 113/73 | HR 85 | Resp 14 | Ht 65.0 in | Wt 143.0 lb

## 2011-03-11 DIAGNOSIS — Z09 Encounter for follow-up examination after completed treatment for conditions other than malignant neoplasm: Secondary | ICD-10-CM

## 2011-03-11 DIAGNOSIS — J869 Pyothorax without fistula: Secondary | ICD-10-CM

## 2011-03-11 DIAGNOSIS — J90 Pleural effusion, not elsewhere classified: Secondary | ICD-10-CM

## 2011-03-11 NOTE — Patient Instructions (Signed)
OK to drive,shower,lift up to 10 lbs.

## 2011-03-11 NOTE — Progress Notes (Signed)
HPI                   [address]            [place] [id number]  HPI:  As the patient returns for followup after undergoing right drainage of empyema and decortication right lower lobe. She is finish her course of oral Augmentin antibiotics. She has postthoracotomy pain. She denies fever. Her appetite and strength and improved. She is having some insomnia, depression, and issues with anxiety and pain. The incision has healed well. She is not smoking.           Current Outpatient Prescriptions  Medication Sig Dispense Refill  . alendronate (FOSAMAX) 70 MG tablet Take 70 mg by mouth every 7 (seven) days. Take in the morning with a full glass of water, on an empty stomach, and do not take anything else by mouth or lie down for the next 30 min.       . Cholecalciferol (VITAMIN D3) 1000 UNITS tablet Take 1,000 Units by mouth daily.        [name] diltiazem (DILACOR XR) 240 MG 24 hr capsule Take 240 mg by mouth daily.        . Fluticasone-Salmeterol (ADVAIR DISKUS) 250-50 MCG/DOSE AEPB Inhale 1 puff into the lungs 2 (two) times daily.        [name] HYDROcodone-acetaminophen (NORCO) 7.5-325 MG per tablet Take 1 tablet by mouth every 6 (six) hours as needed.        . rosuvastatin (CRESTOR) 10 MG tablet Take 10 mg by mouth daily.        [name] SINGULAIR 10 MG tablet TAKE 1 TABLET BY MOUTH ONCE DAILY  30 tablet  5  . tiotropium (SPIRIVA) 18 MCG inhalation capsule Place 18 mcg into inhaler and inhale daily.        [name] Fexofenadine-Pseudoephedrine (ALLEGRA-D 12 HOUR PO) Take by mouth every 12 (twelve) hours as needed.        . fluconazole (DIFLUCAN) 150 MG tablet Take 150 mg by mouth once.        . furosemide (LASIX) 20 MG tablet Take 20 mg by mouth daily.        [name] losartan (COZAAR) 100 MG tablet Take 100 mg by mouth daily.        . meloxicam (MOBIC) 15 MG tablet Take 15 mg by mouth daily.        . traMADol (ULTRAM) 50 MG tablet Take 50 mg by mouth every 6 (six) hours as needed.           Review of  Systems: Her appetite is improved. Her strength is improving. She is walking daily.  Physical Exam Vital signs temperature afebrile blood pressure 140/88 pulse 80 and regular saturation room air 96% General appearance is that of a middle-aged black female no acute distress. Breath sounds are clear bilaterally. The right vats incision is well-healed. Cardiac rhythm is regular without gallop or rub.    Diagnostic Tests: Chest x-ray today shows improved aeration at the right lung base with minimal pleural thickening no significant effusion.   Impression: Improved condition one month following right vas decortication.   Plan: She's request refill for hydrocodone. She is having reflux symptoms and has requested an anti-reflux medication. She is having regular anxiety in the evening and is requested I nerve pill. I provided her with prescriptions for hydrocodone, protonic, and 0.5 mg Ativan. Plan on seeing her back one month for followup. She is  requested home health assistance and we will refer to the home health nursing of agency for possible nursing or nurse's aide assistance.

## 2011-03-13 LAB — FUNGUS CULTURE W SMEAR: Fungal Smear: NONE SEEN

## 2011-03-16 ENCOUNTER — Ambulatory Visit: Payer: Medicare Other | Admitting: Internal Medicine

## 2011-03-23 ENCOUNTER — Encounter: Payer: Self-pay | Admitting: *Deleted

## 2011-03-25 ENCOUNTER — Encounter: Payer: Self-pay | Admitting: *Deleted

## 2011-03-27 LAB — AFB CULTURE WITH SMEAR (NOT AT ARMC)

## 2011-03-31 ENCOUNTER — Other Ambulatory Visit: Payer: Self-pay | Admitting: Cardiothoracic Surgery

## 2011-03-31 DIAGNOSIS — J90 Pleural effusion, not elsewhere classified: Secondary | ICD-10-CM

## 2011-04-06 ENCOUNTER — Ambulatory Visit (INDEPENDENT_AMBULATORY_CARE_PROVIDER_SITE_OTHER): Payer: Self-pay | Admitting: Cardiothoracic Surgery

## 2011-04-06 ENCOUNTER — Ambulatory Visit
Admission: RE | Admit: 2011-04-06 | Discharge: 2011-04-06 | Disposition: A | Discharge status: Home / Self Care | Payer: Medicare Other | Source: Ambulatory Visit | Attending: Cardiothoracic Surgery | Admitting: Cardiothoracic Surgery

## 2011-04-06 ENCOUNTER — Encounter: Payer: Self-pay | Admitting: *Deleted

## 2011-04-06 ENCOUNTER — Encounter: Payer: Self-pay | Admitting: Cardiothoracic Surgery

## 2011-04-06 VITALS — BP 124/79 | HR 76 | Resp 20 | Ht 65.0 in | Wt 144.0 lb

## 2011-04-06 DIAGNOSIS — J90 Pleural effusion, not elsewhere classified: Secondary | ICD-10-CM

## 2011-04-06 DIAGNOSIS — Z09 Encounter for follow-up examination after completed treatment for conditions other than malignant neoplasm: Secondary | ICD-10-CM

## 2011-04-06 DIAGNOSIS — J869 Pyothorax without fistula: Secondary | ICD-10-CM

## 2011-04-06 NOTE — Patient Instructions (Signed)
The patient is recovered well almost 2 months after right decortication for infected pleural effusion from pneumonia. The patient may resume normal activities, the patient is recommended to refrain from smoking, The patient is provided an additional prescription for hydrocodone when necessary thoracotomy pain.

## 2011-04-06 NOTE — Progress Notes (Signed)
HPI: The patient returns for final postop check after undergoing right V. A T.S for an infected pleural effusion the right lower lobe pneumonia. She is now fully recovered. Fortunately she is not resumed smoking. She has only mild to moderate expected postthoracotomy pain. No shortness of breath.  Current Outpatient Prescriptions  Medication Sig Dispense Refill  . alendronate (FOSAMAX) 70 MG tablet Take 70 mg by mouth every 7 (seven) days. Take in the morning with a full glass of water, on an empty stomach, and do not take anything else by mouth or lie down for the next 30 min.       . Cholecalciferol (VITAMIN D3) 1000 UNITS tablet Take 1,000 Units by mouth daily.        [name] diltiazem (DILACOR XR) 240 MG 24 hr capsule Take 240 mg by mouth daily.        [name] Fexofenadine-Pseudoephedrine (ALLEGRA-D 12 HOUR PO) Take by mouth every 12 (twelve) hours as needed.        . fluconazole (DIFLUCAN) 150 MG tablet Take 150 mg by mouth as needed.       . Fluticasone-Salmeterol (ADVAIR DISKUS) 250-50 MCG/DOSE AEPB Inhale 1 puff into the lungs 2 (two) times daily.        . furosemide (LASIX) 20 MG tablet Take 20 mg by mouth daily.        [name] HYDROcodone-acetaminophen (NORCO) 7.5-325 MG per tablet Take 1 tablet by mouth every 6 (six) hours as needed.        [name] losartan (COZAAR) 100 MG tablet Take 100 mg by mouth daily.        . meloxicam (MOBIC) 15 MG tablet Take 15 mg by mouth daily.        . rosuvastatin (CRESTOR) 10 MG tablet Take 10 mg by mouth daily.        [name] SINGULAIR 10 MG tablet TAKE 1 TABLET BY MOUTH ONCE DAILY  30 tablet  5  . tiotropium (SPIRIVA) 18 MCG inhalation capsule Place 18 mcg into inhaler and inhale daily.           Physical Exam: The patient is alert and oriented. Vital signs are stable. Oxygen saturation greater than 95%. Breath sounds are clear and equal. Thoracotomy incision well-healed.   Diagnostic Tests: Chest x-ray shows clear lung fields mild pleural thickening of right lung  base.  Impression: The patient is recovered from minithoracotomy and decortication of the right lung base.  Plan: She returned to the care of Dr. Barbaraann Rondo. She was provided additional prescription of Lortab 5.0 when necessary incisional pain.

## 2011-04-08 ENCOUNTER — Encounter: Payer: Self-pay | Admitting: *Deleted

## 2011-04-08 ENCOUNTER — Other Ambulatory Visit: Payer: Self-pay | Admitting: Cardiology

## 2011-04-08 ENCOUNTER — Other Ambulatory Visit: Payer: Self-pay | Admitting: Internal Medicine

## 2011-04-17 ENCOUNTER — Other Ambulatory Visit: Payer: Self-pay | Admitting: Cardiology

## 2011-05-15 ENCOUNTER — Other Ambulatory Visit: Payer: Self-pay | Admitting: Cardiology

## 2011-05-18 ENCOUNTER — Other Ambulatory Visit: Payer: Self-pay | Admitting: Cardiology

## 2011-05-20 ENCOUNTER — Other Ambulatory Visit: Payer: Self-pay | Admitting: Cardiology

## 2011-05-29 ENCOUNTER — Other Ambulatory Visit (HOSPITAL_COMMUNITY): Payer: Self-pay | Admitting: Cardiology

## 2011-05-29 DIAGNOSIS — Z1231 Encounter for screening mammogram for malignant neoplasm of breast: Secondary | ICD-10-CM

## 2011-06-19 ENCOUNTER — Other Ambulatory Visit: Payer: Self-pay | Admitting: Internal Medicine

## 2011-06-25 ENCOUNTER — Ambulatory Visit (HOSPITAL_COMMUNITY): Payer: Medicare Other

## 2011-06-25 ENCOUNTER — Ambulatory Visit (HOSPITAL_COMMUNITY)
Admission: RE | Admit: 2011-06-25 | Discharge: 2011-06-25 | Disposition: A | Discharge status: Home / Self Care | Payer: Medicare Other | Source: Ambulatory Visit | Attending: Cardiology | Admitting: Cardiology

## 2011-06-25 DIAGNOSIS — Z1231 Encounter for screening mammogram for malignant neoplasm of breast: Secondary | ICD-10-CM

## 2011-07-02 ENCOUNTER — Other Ambulatory Visit: Payer: Self-pay | Admitting: *Deleted

## 2011-07-02 DIAGNOSIS — G8918 Other acute postprocedural pain: Secondary | ICD-10-CM

## 2011-07-02 MED ORDER — HYDROCODONE-ACETAMINOPHEN 7.5-325 MG PO TABS: 1.0000 | ORAL_TABLET | Freq: Four times a day (QID) | ORAL | Status: DC | PRN

## 2011-07-02 MED ORDER — MELOXICAM 15 MG PO TABS: 15.0000 mg | ORAL_TABLET | Freq: Every day | ORAL | Status: DC

## 2011-07-02 NOTE — Telephone Encounter (Signed)
[name] called with c/o ongoing post-thoracotomy pain that is very concerning to her. It is now traveling from her ribs into her stomach.  I will refill her Norco and Mobic and schedule her to see Dr. Donata Clay for further discussion on 07/16/11.  She agrees.

## 2011-07-09 ENCOUNTER — Other Ambulatory Visit: Payer: Self-pay | Admitting: Cardiothoracic Surgery

## 2011-07-09 DIAGNOSIS — J869 Pyothorax without fistula: Secondary | ICD-10-CM

## 2011-07-15 ENCOUNTER — Encounter: Payer: Medicare Other | Admitting: Cardiothoracic Surgery

## 2011-07-21 ENCOUNTER — Other Ambulatory Visit: Payer: Self-pay | Admitting: Cardiothoracic Surgery

## 2011-07-21 DIAGNOSIS — J869 Pyothorax without fistula: Secondary | ICD-10-CM

## 2011-07-21 DIAGNOSIS — J90 Pleural effusion, not elsewhere classified: Secondary | ICD-10-CM

## 2011-07-22 ENCOUNTER — Ambulatory Visit
Admission: RE | Admit: 2011-07-22 | Discharge: 2011-07-22 | Disposition: A | Discharge status: Home / Self Care | Payer: Medicare Other | Source: Ambulatory Visit | Attending: Cardiothoracic Surgery | Admitting: Cardiothoracic Surgery

## 2011-07-22 ENCOUNTER — Encounter: Payer: Self-pay | Admitting: Cardiothoracic Surgery

## 2011-07-22 ENCOUNTER — Ambulatory Visit (INDEPENDENT_AMBULATORY_CARE_PROVIDER_SITE_OTHER): Payer: Medicare Other | Admitting: Cardiothoracic Surgery

## 2011-07-22 VITALS — BP 132/80 | HR 98 | Resp 16 | Ht 66.0 in | Wt 150.0 lb

## 2011-07-22 DIAGNOSIS — J90 Pleural effusion, not elsewhere classified: Secondary | ICD-10-CM

## 2011-07-22 DIAGNOSIS — J869 Pyothorax without fistula: Secondary | ICD-10-CM

## 2011-07-22 DIAGNOSIS — Z09 Encounter for follow-up examination after completed treatment for conditions other than malignant neoplasm: Secondary | ICD-10-CM

## 2011-07-26 NOTE — Progress Notes (Signed)
PCP is Robynn Pane, MD, MD Referring Provider is Robynn Pane, MD  Chief Complaint  Patient presents with  . Follow-up    cont with post op pain...wants to discuss    HPI: Patient is a 66 year old female who had a right thoracotomy and decortication of empyema from pneumonia in September last year. She still has problems with postthoracotomy pain and presents the office for discussion and help with that pain. Does not some I. she is taking any narcotics or CT narcotics. She is stop smoking.   Past Medical History  Diagnosis Date  . TOBACCO ABUSE   . SPINAL STENOSIS, LUMBAR     MRI 12/2005; surg 2004  . ALLERGIC RHINITIS   . ASTHMA, UNSPECIFIED, UNSPECIFIED STATUS   . HYPERTENSION   . DYSLIPIDEMIA   . Mild renal insufficiency     Past Surgical History  Procedure Date  . Decompressive laminectomy     Fusion L4-5, L5-S1 ([facility]-2004)  . Right hand 1995  . Right knee 1990  . Right video-assisted thoracoscopic surgery, drainage of empyema. 02/12/2011  . Decortication of right lower lobe   . Closure of bronchopleural fistula, right lower lobe.   . Placement of wound on-q analgesia irrigation system.     Family History  Problem Relation Age of Onset  . Arthritis Other   . Hypertension Other     Social History History  Substance Use Topics  . Smoking status: Former Smoker -- 1.0 packs/day    Types: Cigarettes  . 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 Alcohol Use: No    Current Outpatient Prescriptions  Medication Sig Dispense Refill  . alendronate (FOSAMAX) 70 MG tablet Take 70 mg by mouth every 7 (seven) days. Take in the morning with a full glass of water, on an empty stomach, and do not take anything else by mouth or lie down for the next 30 min.       . Cholecalciferol (VITAMIN D3) 1000 UNITS tablet Take 1,000 Units by mouth daily.        [name] diltiazem (DILACOR XR) 240 MG 24 hr capsule Take  240 mg by mouth daily.        [name] Fexofenadine-Pseudoephedrine (ALLEGRA-D 12 HOUR PO) Take by mouth every 12 (twelve) hours as needed.        . fluconazole (DIFLUCAN) 150 MG tablet Take 150 mg by mouth as needed.       . Fluticasone-Salmeterol (ADVAIR DISKUS) 250-50 MCG/DOSE AEPB Inhale 1 puff into the lungs 2 (two) times daily.        . furosemide (LASIX) 20 MG tablet Take 20 mg by mouth daily.        [name] losartan (COZAAR) 100 MG tablet Take 100 mg by mouth daily.        . meloxicam (MOBIC) 15 MG tablet Take 1 tablet (15 mg total) by mouth daily.  30 tablet  1  . montelukast (SINGULAIR) 10 MG tablet take 1 tablet by mouth once daily  30 tablet  3  . pantoprazole (PROTONIX) 40 MG tablet Take 40 mg by mouth daily.        . rosuvastatin (CRESTOR) 10 MG tablet Take 10 mg by mouth daily.        [name] tiotropium (SPIRIVA) 18 MCG inhalation capsule Place 18 mcg into inhaler and inhale daily.        [name] HYDROcodone-acetaminophen (NORCO) 7.5-325 MG per tablet Take  1 tablet by mouth every 6 (six) hours as needed (one or two tabs every four to six hours prn pain).  40 tablet  0    No Known Allergies  Review of Systems no fever productive cough she is not smoking BP 132/80  Pulse 98  Resp 16  Ht 5\' 6"  (1.676 m)  Wt 150 lb (68.04 kg)  BMI 24.21 kg/m2  SpO2 98% Physical Exam Alert and comfortable Breath sounds clear and equal Right minithoracotomy well-healed Cardiac rhythm regular Neurologic intact  Diagnostic Tests: Chest x-ray with mild pleural thickening of the right base no evidence of recurrent effusion or infiltrate, no rib fractures.   Impression: Postthoracotomy pain, retracted  Plan:The patient be given Toradol 3 times a day and bedtime dose of Elavil. No more necrotic should be needed. Referral to pain clinic will be a consideration in the future if the pain persists

## 2011-07-26 NOTE — Patient Instructions (Signed)
Your pain after thoracotomy will take several months to completely resolve. Should not expect to be completely pain free. The medications we have provided will reduce but not eliminate the pain. He will have permanent slight numbness or loss of his sensation beneath the right breast line from your surgery to remove infection around the right lung.

## 2011-09-14 ENCOUNTER — Other Ambulatory Visit: Payer: Self-pay | Admitting: Physician Assistant

## 2011-10-28 ENCOUNTER — Encounter: Payer: Medicare Other | Admitting: Cardiothoracic Surgery

## 2011-11-11 ENCOUNTER — Other Ambulatory Visit: Payer: Self-pay | Admitting: *Deleted

## 2011-11-11 ENCOUNTER — Other Ambulatory Visit: Payer: Self-pay | Admitting: Physician Assistant

## 2011-11-11 DIAGNOSIS — G8918 Other acute postprocedural pain: Secondary | ICD-10-CM

## 2011-11-11 MED ORDER — TRAMADOL HCL 50 MG PO TABS: 50.0000 mg | ORAL_TABLET | Freq: Four times a day (QID) | ORAL | Status: DC | PRN

## 2011-11-18 ENCOUNTER — Encounter: Payer: Medicare Other | Admitting: Cardiothoracic Surgery

## 2011-11-25 ENCOUNTER — Encounter: Payer: Medicare Other | Admitting: Cardiothoracic Surgery

## 2011-11-25 ENCOUNTER — Encounter: Payer: Self-pay | Admitting: Cardiothoracic Surgery

## 2011-11-25 ENCOUNTER — Ambulatory Visit (INDEPENDENT_AMBULATORY_CARE_PROVIDER_SITE_OTHER): Payer: Medicare Other | Admitting: Cardiothoracic Surgery

## 2011-11-25 VITALS — BP 108/67 | HR 100 | Resp 20 | Ht 66.0 in | Wt 160.0 lb

## 2011-11-25 DIAGNOSIS — G8912 Acute post-thoracotomy pain: Secondary | ICD-10-CM

## 2011-11-25 DIAGNOSIS — Z8709 Personal history of other diseases of the respiratory system: Secondary | ICD-10-CM

## 2011-11-25 DIAGNOSIS — J869 Pyothorax without fistula: Secondary | ICD-10-CM

## 2011-11-25 NOTE — Progress Notes (Signed)
PCP is Robynn Pane, MD Referring Provider is Robynn Pane, MD  Chief Complaint  Patient presents with  . Routine Post Op    3 month f/u, re-eval post thoracotomy pain, new med eval. S/P Rt thoracotomy and decortication of empyema 9/20131    HPI: 66 year old female smoker 9 months postop right minithoracotomy and decortication of empyema. She continues to have post thoracotomy pain, mild controlled with Ultram. She continues to smoke. There is no evidence or recurrent pneumonia or pleural effusion. A chest x-ray was last performed 3 months ago which was clear.   Past Medical History  Diagnosis Date  . TOBACCO ABUSE   . SPINAL STENOSIS, LUMBAR     MRI 12/2005; surg 2004  . ALLERGIC RHINITIS   . ASTHMA, UNSPECIFIED, UNSPECIFIED STATUS   . HYPERTENSION   . DYSLIPIDEMIA   . Mild renal insufficiency     Past Surgical History  Procedure Date  . Decompressive laminectomy     Fusion L4-5, L5-S1 ([facility]-2004)  . Right hand 1995  . Right knee 1990  . Right video-assisted thoracoscopic surgery, drainage of empyema. 02/12/2011  . Decortication of right lower lobe   . Closure of bronchopleural fistula, right lower lobe.   . Placement of wound on-q analgesia irrigation system.     Family History  Problem Relation Age of Onset  . Arthritis Other   . Hypertension Other     Social History History  Substance Use Topics  . Smoking status: Current Everyday Smoker -- 1.0 packs/day    Types: Cigarettes  . 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 Alcohol Use: No    Current Outpatient Prescriptions  Medication Sig Dispense Refill  . alendronate (FOSAMAX) 70 MG tablet Take 70 mg by mouth every 7 (seven) days. Take in the morning with a full glass of water, on an empty stomach, and do not take anything else by mouth or lie down for the next 30 min.       . Cholecalciferol (VITAMIN D3) 1000 UNITS tablet Take 1,000  Units by mouth daily.        [name] diltiazem (DILACOR XR) 240 MG 24 hr capsule Take 240 mg by mouth daily.        [name] Fexofenadine-Pseudoephedrine (ALLEGRA-D 12 HOUR PO) Take by mouth every 12 (twelve) hours as needed.        . fluconazole (DIFLUCAN) 150 MG tablet Take 150 mg by mouth as needed.       . Fluticasone-Salmeterol (ADVAIR DISKUS) 250-50 MCG/DOSE AEPB Inhale 1 puff into the lungs 2 (two) times daily.        . furosemide (LASIX) 20 MG tablet Take 20 mg by mouth daily.        [name] losartan (COZAAR) 100 MG tablet Take 100 mg by mouth daily.        . montelukast (SINGULAIR) 10 MG tablet take 1 tablet by mouth once daily  30 tablet  3  . pantoprazole (PROTONIX) 40 MG tablet Take 40 mg by mouth daily.        . rosuvastatin (CRESTOR) 10 MG tablet Take 10 mg by mouth daily.        [name] tiotropium (SPIRIVA) 18 MCG inhalation capsule Place 18 mcg into inhaler and inhale daily.        . traMADol (ULTRAM) 50 MG tablet Take 1 tablet (50 mg total) by mouth every 6 (six) hours as needed for  pain.  40 tablet  0    No Known Allergies  Review of Systems no fever no shortness of breath no productive cough  BP 108/67  Pulse 100  Resp 20  Ht 5\' 6"  (1.676 m)  Wt 160 lb (72.576 kg)  BMI 25.82 kg/m2  SpO2 95% Physical Exam Minithoracotomy incision well-healed Lung sounds clear bilaterally Cardiac rhythm regular  Diagnostic Tests:   Impression: Mild postthoracotomy pain control with Ultram Patient was reassured that there is nothing wrong that she should gradually wean herself off all TRAM and use Tylenol as needed. Return when necessary

## 2011-12-08 ENCOUNTER — Other Ambulatory Visit: Payer: Self-pay | Admitting: Specialist

## 2011-12-19 ENCOUNTER — Ambulatory Visit
Admission: RE | Admit: 2011-12-19 | Discharge: 2011-12-19 | Disposition: A | Discharge status: Home / Self Care | Payer: Medicare Other | Source: Ambulatory Visit | Attending: Specialist | Admitting: Specialist

## 2012-01-12 ENCOUNTER — Emergency Department (HOSPITAL_COMMUNITY)
Admission: EM | Admit: 2012-01-12 | Discharge: 2012-01-12 | Disposition: A | Discharge status: Home / Self Care | Payer: Medicare Other | Attending: Emergency Medicine | Admitting: Emergency Medicine

## 2012-01-12 ENCOUNTER — Encounter (HOSPITAL_COMMUNITY): Payer: Self-pay | Admitting: *Deleted

## 2012-01-12 DIAGNOSIS — H5710 Ocular pain, unspecified eye: Secondary | ICD-10-CM | POA: Insufficient documentation

## 2012-01-12 DIAGNOSIS — R51 Headache: Secondary | ICD-10-CM | POA: Insufficient documentation

## 2012-01-12 DIAGNOSIS — J45909 Unspecified asthma, uncomplicated: Secondary | ICD-10-CM | POA: Insufficient documentation

## 2012-01-12 DIAGNOSIS — I10 Essential (primary) hypertension: Secondary | ICD-10-CM | POA: Insufficient documentation

## 2012-01-12 DIAGNOSIS — F17200 Nicotine dependence, unspecified, uncomplicated: Secondary | ICD-10-CM | POA: Insufficient documentation

## 2012-01-12 DIAGNOSIS — E785 Hyperlipidemia, unspecified: Secondary | ICD-10-CM | POA: Insufficient documentation

## 2012-01-12 LAB — CBC
HCT: 43.9 % (ref 36.0–46.0)
Hemoglobin: 15.1 g/dL — ABNORMAL HIGH (ref 12.0–15.0)
RBC: 4.79 MIL/uL (ref 3.87–5.11)
WBC: 10.2 10*3/uL (ref 4.0–10.5)

## 2012-01-12 LAB — BASIC METABOLIC PANEL
CO2: 23 mEq/L (ref 19–32)
Chloride: 106 mEq/L (ref 96–112)
Glucose, Bld: 77 mg/dL (ref 70–99)
Potassium: 4.2 mEq/L (ref 3.5–5.1)
Sodium: 140 mEq/L (ref 135–145)

## 2012-01-12 LAB — SEDIMENTATION RATE: Sed Rate: 23 mm/hr — ABNORMAL HIGH (ref 0–22)

## 2012-01-12 MED ORDER — FLUORESCEIN SODIUM 1 MG OP STRP
1.0000 | ORAL_STRIP | Freq: Once | OPHTHALMIC | Status: AC
  Administered 2012-01-12: 1 via OPHTHALMIC
  Filled 2012-01-12: qty 1

## 2012-01-12 MED ORDER — HYDROCODONE-ACETAMINOPHEN 5-325 MG PO TABS
1.0000 | ORAL_TABLET | Freq: Once | ORAL | Status: AC
  Administered 2012-01-12: 1 via ORAL
  Filled 2012-01-12: qty 1

## 2012-01-12 MED ORDER — HYDROCODONE-ACETAMINOPHEN 5-325 MG PO TABS: 1.0000 | ORAL_TABLET | Freq: Four times a day (QID) | ORAL | Status: AC | PRN

## 2012-01-12 MED ORDER — TETRACAINE HCL 0.5 % OP SOLN
1.0000 [drp] | Freq: Once | OPHTHALMIC | Status: AC
  Administered 2012-01-12: 1 [drp] via OPHTHALMIC
  Filled 2012-01-12: qty 2

## 2012-01-12 NOTE — ED Notes (Signed)
Pt c/o right eye pain x yesterday, no vision changes, pain worst when looking at the right side. Right eye is red, painful to palpate

## 2012-01-12 NOTE — ED Notes (Signed)
Pt not in [facility] when called

## 2012-01-12 NOTE — ED Provider Notes (Signed)
History     CSN: [id number]  Arrival date & time 01/12/12  1603   First MD Initiated Contact with Patient 01/12/12 1823      Chief Complaint  Patient presents with  . Eye Pain    (Consider location/radiation/quality/duration/timing/severity/associated sxs/prior treatment) HPI Comments: Patient is a 66 year old African American female with a history of hypertension and hyperlipidemia presents emergency department with chief complaint of right eye pain.  Onset of symptoms began yesterday and is described as pain with extraocular movements, tender scalp, headache, jaw pain, blurred vision and generalized right side facial pain.  Patient states that she got her hair done very tight a couple days ago and she questions if this could be the possible causes of her symptoms. Pt denies any fever, fatigue, malaise, diplopia, recent trauma, contact lens wearing, or photophobia.  Patient is a 66 y.o. female presenting with eye pain. The history is provided by the patient.  Eye Pain Associated symptoms include headaches (and scalp tightness). Pertinent negatives include no abdominal pain, chest pain, chills, congestion, coughing, diaphoresis, fatigue, fever, myalgias, neck pain, numbness, rash or weakness.    Past Medical History  Diagnosis Date  . TOBACCO ABUSE   . SPINAL STENOSIS, LUMBAR     MRI 12/2005; surg 2004  . ALLERGIC RHINITIS   . ASTHMA, UNSPECIFIED, UNSPECIFIED STATUS   . HYPERTENSION   . DYSLIPIDEMIA   . Mild renal insufficiency     Past Surgical History  Procedure Date  . Decompressive laminectomy     Fusion L4-5, L5-S1 ([facility]-2004)  . Right hand 1995  . Right knee 1990  . Right video-assisted thoracoscopic surgery, drainage of empyema. 02/12/2011  . Decortication of right lower lobe   . Closure of bronchopleural fistula, right lower lobe.   . Placement of wound on-q analgesia irrigation system.     Family History  Problem Relation Age of Onset  . Arthritis Other   .  Hypertension Other     History  Substance Use Topics  . Smoking status: Current Everyday Smoker -- 1.0 packs/day    Types: Cigarettes  . 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 Alcohol Use: No    OB History    Grav Para Term Preterm Abortions TAB SAB Ect Mult Living                  Review of Systems  Constitutional: Negative for fever, chills, diaphoresis, activity change, appetite change, fatigue and unexpected weight change.  HENT: Negative for congestion and neck pain.   Eyes: Positive for pain and visual disturbance.  Respiratory: Negative for cough and shortness of breath.   Cardiovascular: Negative for chest pain and leg swelling.  Gastrointestinal: Negative for abdominal pain.  Genitourinary: Negative for dysuria, urgency and frequency.  Musculoskeletal: Negative for myalgias.  Skin: Negative for color change, rash and wound.  Neurological: Positive for headaches (and scalp tightness). Negative for dizziness, syncope, weakness, light-headedness and numbness.  Psychiatric/Behavioral: Negative for confusion.  All other systems reviewed and are negative.    Allergies  Review of patient's allergies indicates no known allergies.  Home Medications   Current Outpatient Rx  Name Route Sig Dispense Refill  . ALENDRONATE SODIUM 70 MG PO TABS Oral Take 70 mg by mouth every 7 (seven) days. Every tuesdays.Marland KitchenMarland KitchenMarland KitchenTake in the morning with a full glass of water, on an empty stomach, and do not take anything else by mouth or  lie down for the next 30 min.    [name] DILTIAZEM HCL 240 MG PO CP24 Oral Take 240 mg by mouth 2 (two) times daily.     [name] FLUTICASONE-SALMETEROL 250-50 MCG/DOSE IN AEPB Inhalation Inhale 1 puff into the lungs 2 (two) times daily.      . FUROSEMIDE 20 MG PO TABS Oral Take 20 mg by mouth daily.      [name] LOSARTAN POTASSIUM 100 MG PO TABS Oral Take 100 mg by mouth daily.      [name] MONTELUKAST SODIUM 10 MG  PO TABS  take 1 tablet by mouth once daily 30 tablet 3  . PANTOPRAZOLE SODIUM 40 MG PO TBEC Oral Take 40 mg by mouth daily.      [name] ROSUVASTATIN CALCIUM 10 MG PO TABS Oral Take 10 mg by mouth daily.      [name] TIOTROPIUM BROMIDE MONOHYDRATE 18 MCG IN CAPS Inhalation Place 18 mcg into inhaler and inhale daily.      . TRAMADOL HCL 50 MG PO TABS Oral Take 50 mg by mouth every 6 (six) hours as needed. pain      BP 112/86  Pulse 96  Temp 98.3 F (36.8 C) (Oral)  Resp 16  SpO2 99%  Physical Exam  Nursing note and vitals reviewed. Constitutional: She is oriented to person, place, and time. She appears well-developed and well-nourished. No distress.  HENT:  Head: Normocephalic and atraumatic.  Eyes: Conjunctivae and EOM are normal. Pupils are equal, round, and reactive to light. No foreign body present in the left eye.       Mild tenderness to palpation of temporal arteries, No ttp of orbital region. Pain w EOMs, visual acuity equal bilaterally, no increase in IOPs, no proptosis, lid swelling, hyphema, purulent discharge from eyes, or consensual photophobia.  Eyelids everted, no evidence of FB.  Fluorescein study: no corneal uptake, dendritic pattern, or evidence of corneal abrasions.  Neck: Normal range of motion. Neck supple.  Pulmonary/Chest: Effort normal.  Neurological: She is alert and oriented to person, place, and time.  Skin: Skin is warm and dry. No rash noted. She is not diaphoretic.  Psychiatric: Her behavior is normal.    ED Course  Procedures (including critical care time)  Labs Reviewed  CBC - Abnormal; Notable for the following:    Hemoglobin 15.1 (*)     All other components within normal limits  SEDIMENTATION RATE - Abnormal; Notable for the following:    Sed Rate 23 (*)     All other components within normal limits  BASIC METABOLIC PANEL - Abnormal; Notable for the following:    Creatinine, Ser 1.35 (*)     Calcium 10.6 (*)     GFR calc non Af Amer 41 (*)     GFR  calc Af Amer 47 (*)     All other components within normal limits   No results found.   No diagnosis found.    MDM  HA, Eye Pain  Pt HA treated and improved while in ED.Non concerning for [facility], ICH, Meningitis, or temporal arteritis. Pt is afebrile with no focal neuro deficits, nuchal rigidity, or change in vision. Pt is to follow up with PCP. Strict return precautions discussed Pt verbalizes understanding and is agreeable with plan to dc.  No evidence of HSV or VSV infection. Pt is not a contact lens wearer.  Exam non-concerning for orbital cellulitis, hyphema, corneal ulcers, corneal abrasions or trauma.  Patient has been instructed to use cool compresses.  Patient understands when to return to the er, especially if new symptoms including change in vision, purulent drainage, or entrapment occur.            Jaci Carrel, [place] 01/12/12 2120  [place], PA-C 01/12/12 2120

## 2012-01-12 NOTE — ED Notes (Signed)
Rx given x1 Pt ambulating independently w/ steady gait on d/c in no acute distress, A&Ox4. D/c instructions reviewed w/ pt - pt denies any further questions or concerns at present.   

## 2012-01-13 NOTE — ED Provider Notes (Signed)
Medical screening examination/treatment/procedure(s) were performed by non-physician practitioner and as supervising physician I was immediately available for consultation/collaboration.  Briyah Wheelwright R. Rhys Anchondo, MD 01/13/12 0007 

## 2012-04-17 ENCOUNTER — Emergency Department (HOSPITAL_COMMUNITY)
Admission: EM | Admit: 2012-04-17 | Discharge: 2012-04-17 | Disposition: A | Discharge status: Home / Self Care | Payer: Medicare Other | Attending: Emergency Medicine | Admitting: Emergency Medicine

## 2012-04-17 ENCOUNTER — Encounter (HOSPITAL_COMMUNITY): Payer: Self-pay | Admitting: *Deleted

## 2012-04-17 DIAGNOSIS — Y929 Unspecified place or not applicable: Secondary | ICD-10-CM | POA: Insufficient documentation

## 2012-04-17 DIAGNOSIS — E785 Hyperlipidemia, unspecified: Secondary | ICD-10-CM | POA: Insufficient documentation

## 2012-04-17 DIAGNOSIS — Y939 Activity, unspecified: Secondary | ICD-10-CM | POA: Insufficient documentation

## 2012-04-17 DIAGNOSIS — N289 Disorder of kidney and ureter, unspecified: Secondary | ICD-10-CM | POA: Insufficient documentation

## 2012-04-17 DIAGNOSIS — S139XXA Sprain of joints and ligaments of unspecified parts of neck, initial encounter: Secondary | ICD-10-CM | POA: Insufficient documentation

## 2012-04-17 DIAGNOSIS — J45909 Unspecified asthma, uncomplicated: Secondary | ICD-10-CM | POA: Insufficient documentation

## 2012-04-17 DIAGNOSIS — Z8739 Personal history of other diseases of the musculoskeletal system and connective tissue: Secondary | ICD-10-CM | POA: Insufficient documentation

## 2012-04-17 DIAGNOSIS — S161XXA Strain of muscle, fascia and tendon at neck level, initial encounter: Secondary | ICD-10-CM

## 2012-04-17 DIAGNOSIS — F17200 Nicotine dependence, unspecified, uncomplicated: Secondary | ICD-10-CM | POA: Insufficient documentation

## 2012-04-17 DIAGNOSIS — I10 Essential (primary) hypertension: Secondary | ICD-10-CM | POA: Insufficient documentation

## 2012-04-17 DIAGNOSIS — X58XXXA Exposure to other specified factors, initial encounter: Secondary | ICD-10-CM | POA: Insufficient documentation

## 2012-04-17 DIAGNOSIS — Z79899 Other long term (current) drug therapy: Secondary | ICD-10-CM | POA: Insufficient documentation

## 2012-04-17 MED ORDER — DIAZEPAM 5 MG PO TABS: 5.0000 mg | ORAL_TABLET | Freq: Two times a day (BID) | ORAL | Status: DC

## 2012-04-17 MED ORDER — KETOROLAC TROMETHAMINE 60 MG/2ML IM SOLN
60.0000 mg | Freq: Once | INTRAMUSCULAR | Status: AC
  Administered 2012-04-17: 60 mg via INTRAMUSCULAR
  Filled 2012-04-17: qty 2

## 2012-04-17 NOTE — ED Provider Notes (Signed)
History     CSN: [id number]  Arrival date & time 04/17/12  0124   First MD Initiated Contact with Patient 04/17/12 [phone]      Chief Complaint  Patient presents with  . Neck Pain    (Consider location/radiation/quality/duration/timing/severity/associated sxs/prior treatment) HPI History provided by pt.   Pt presents w/ several months of constant left lateral and posterior neck and posterior shoulder pain.  Aggravated by head rotation and ROM of LUE as well as palpation.  Has not had relief w/ advil and heating pad.  Denies fever, dyspnea, dysphagia, CP, SOB, extremity weakness/paresthesias.  Denies trauma.  No PMH.  Concerned she may have throat cancer because she's a smoker.   Past Medical History  Diagnosis Date  . TOBACCO ABUSE   . SPINAL STENOSIS, LUMBAR     MRI 12/2005; surg 2004  . ALLERGIC RHINITIS   . ASTHMA, UNSPECIFIED, UNSPECIFIED STATUS   . HYPERTENSION   . DYSLIPIDEMIA   . Mild renal insufficiency     Past Surgical History  Procedure Date  . Decompressive laminectomy     Fusion L4-5, L5-S1 ([facility]-2004)  . Right hand 1995  . Right knee 1990  . Right video-assisted thoracoscopic surgery, drainage of empyema. 02/12/2011  . Decortication of right lower lobe   . Closure of bronchopleural fistula, right lower lobe.   . Placement of wound on-q analgesia irrigation system.     Family History  Problem Relation Age of Onset  . Arthritis Other   . Hypertension Other     History  Substance Use Topics  . Smoking status: Current Every Day Smoker -- 0.5 packs/day    Types: Cigarettes  . 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    Allergies  Review of patient's allergies indicates no known allergies.  Home  Medications   Current Outpatient Rx  Name  Route  Sig  Dispense  Refill  . ALENDRONATE SODIUM 70 MG PO TABS   Oral   Take 70 mg by mouth every 7 (seven) days. Every tuesdays.Marland KitchenMarland KitchenMarland KitchenTake in the morning with a full glass of water, on an empty stomach, and do not take anything else by mouth or lie down for the next 30 min.         [name] DILTIAZEM HCL 240 MG PO CP24   Oral   Take 240 mg by mouth 2 (two) times daily.          [name] FLUTICASONE-SALMETEROL 250-50 MCG/DOSE IN AEPB   Inhalation   Inhale 1 puff into the lungs 2 (two) times daily.           . FUROSEMIDE 20 MG PO TABS   Oral   Take 20 mg by mouth daily.           . IBUPROFEN 200 MG PO TABS   Oral   Take 200 mg by mouth every 6 (six) hours as needed. Pain         . LOSARTAN POTASSIUM 100 MG PO TABS   Oral   Take 100 mg by mouth daily.           [name]  MONTELUKAST SODIUM 10 MG PO TABS      take 1 tablet by mouth once daily   30 tablet   3   . PANTOPRAZOLE SODIUM 40 MG PO TBEC   Oral   Take 40 mg by mouth daily.           [name] ROSUVASTATIN CALCIUM 10 MG PO TABS   Oral   Take 10 mg by mouth daily.           [name] TIOTROPIUM BROMIDE MONOHYDRATE 18 MCG IN CAPS   Inhalation   Place 18 mcg into inhaler and inhale daily.           . TRAMADOL HCL 50 MG PO TABS   Oral   Take 50 mg by mouth every 6 (six) hours as needed. pain         . DIAZEPAM 5 MG PO TABS   Oral   Take 1 tablet (5 mg total) by mouth 2 (two) times daily.   15 tablet   0     BP 131/84  Pulse 110  Temp 98.5 F (36.9 C) (Oral)  Resp 18  Ht 5\' 6"  (1.676 m)  Wt 156 lb 3.2 oz (70.852 kg)  BMI 25.21 kg/m2  SpO2 100%  Physical Exam  Nursing note and vitals reviewed. Constitutional: She is oriented to person, place, and time. She appears well-developed and well-nourished. No distress.  HENT:  Head: Normocephalic and atraumatic.  Eyes:       Normal appearance  Neck: Normal range of motion. Neck supple.       No mass.  No carotid bruit.  Pt able  to swallow.  No hoarseness of voice.    Cardiovascular: Normal rate and regular rhythm.   Pulmonary/Chest: Effort normal and breath sounds normal. No stridor. No respiratory distress.  Musculoskeletal: Normal range of motion.       No edema or skin changes of neck.  Cervical spine non-tender. Mild tenderness over L trap and SCM.  Pt reports mild pain w/ head rotation and active ROM of LUE.    Lymphadenopathy:    She has no cervical adenopathy.  Neurological: She is alert and oriented to person, place, and time.  Skin: Skin is warm and dry. No rash noted.  Psychiatric: She has a normal mood and affect. Her behavior is normal.    ED Course  Procedures (including critical care time)  Labs Reviewed - No data to display No results found.   1. Cervical strain       MDM  [phone] F presents w/ non-traumatic left posterior and lateral neck pain.  She is concerned that she may have cancer because she is a smoker.  No exam findings concerning for cancer or infection.  S/sx most consistent w/ cervical strain.  Discussed at length with patient.  Prescribed valium and recommended that she continue advil, heat and avoidance of aggravating activities.  Referred back to her PCP for further evaluation if symptoms persist.  Return precautions discussed.         Otilio Miu, PA-C 04/17/12 2342

## 2012-04-17 NOTE — ED Notes (Addendum)
Pt c/o neck pain. Pain radiates to the left and around to her back. She noticed pain when she turns her head, feels like area is swollen. Has concern with cancer. She states she is a smoker  Also c/o lt shoulder pain.

## 2012-04-17 NOTE — ED Notes (Signed)
Pt states that she has had a cramp in her neck since June early summer.  Pain is severe and now Advil is not working,  States she is now on antibiotics for "hayfever"

## 2012-04-18 NOTE — ED Provider Notes (Signed)
Medical screening examination/treatment/procedure(s) were performed by non-physician practitioner and as supervising physician I was immediately available for consultation/collaboration.  Olivia Mackie, MD 04/18/12 [phone]

## 2012-08-06 IMAGING — CR DG CHEST 1V PORT
1 series · 1 of 1 positions shown · non-contrast
Comparison: 02/10/2011

CLINICAL DATA: Pneumonia.

PORTABLE CHEST - 1 VIEW

[view not recorded]
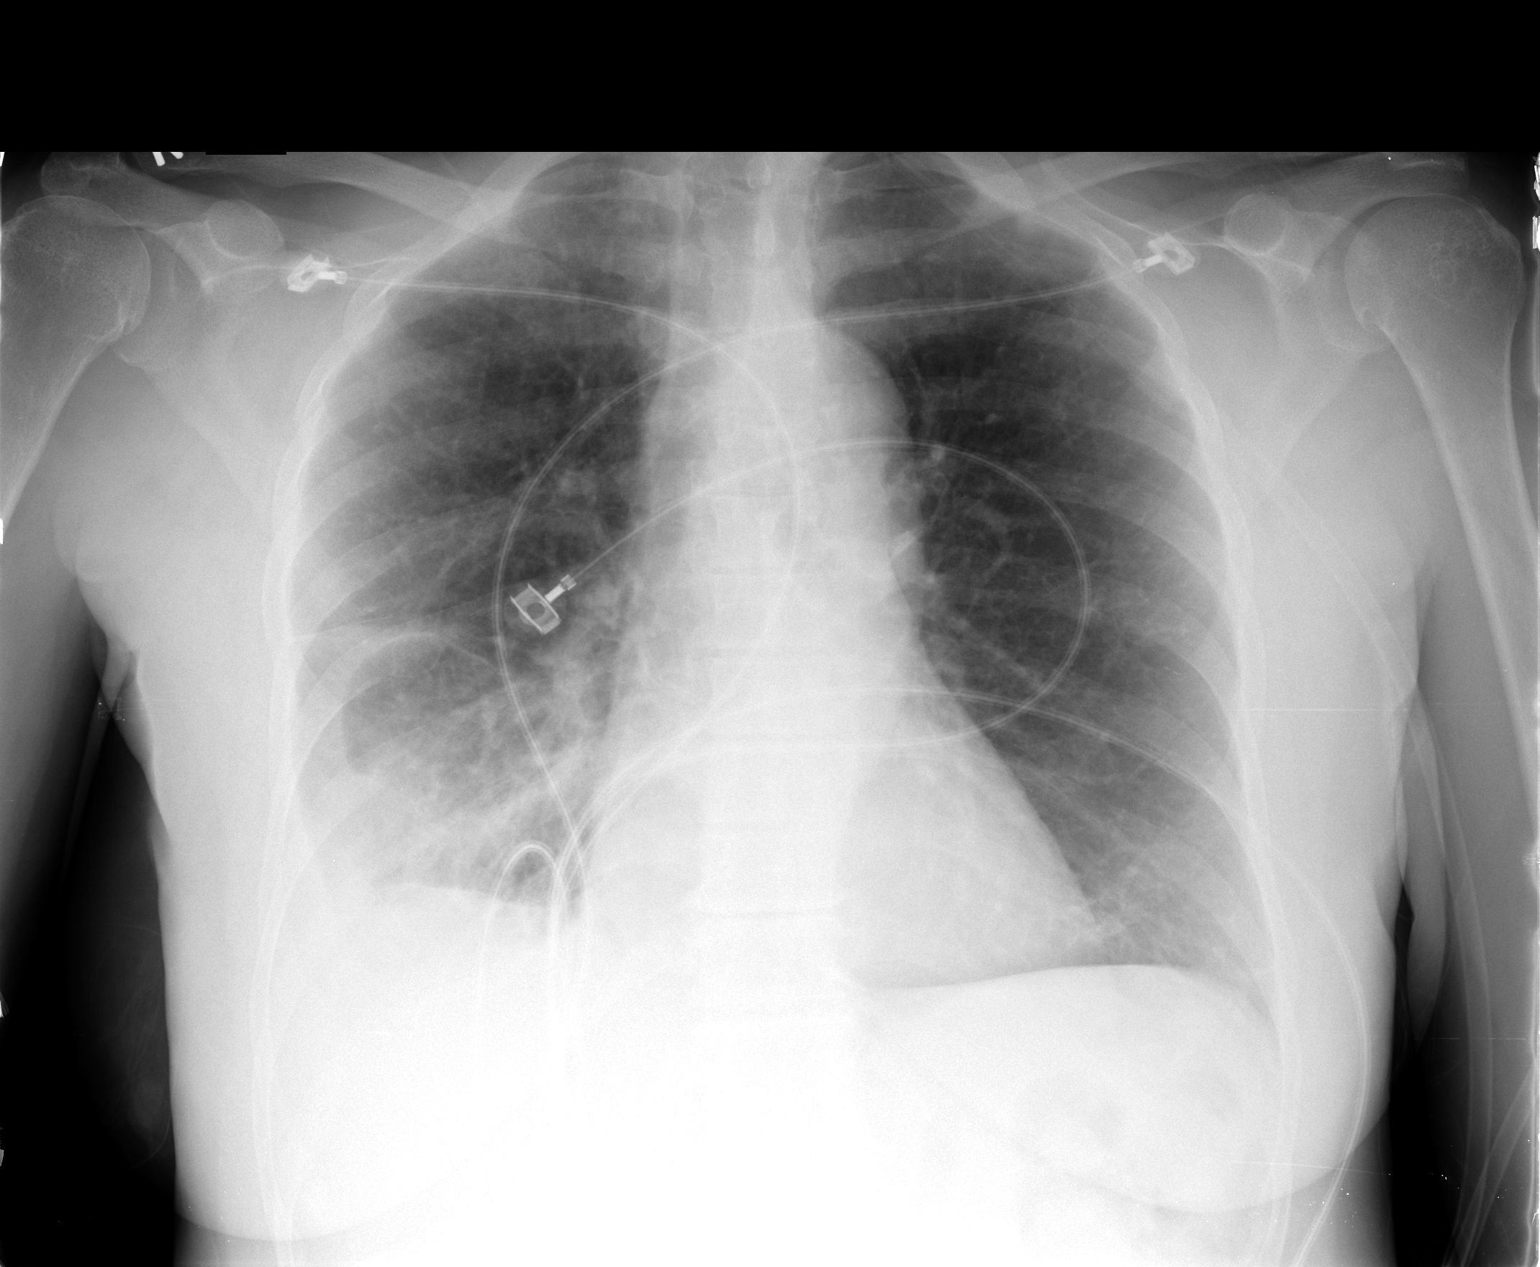

[1 of 1 positions shown; findings below may reference images not displayed]

FINDINGS: Right lower lobe airspace disease with small right
effusion again noted, minimally improved.  Heart is normal size.
Left lung is clear.  No acute bony abnormality.
IMPRESSION: Slight improvement in right lower lobe consolidation and right
effusion.

## 2012-08-06 IMAGING — CR DG CHEST 1V PORT
1 series · 1 of 1 positions shown · non-contrast
Comparison: Earlier today at [DATE]

CLINICAL DATA: Postop right vats

PORTABLE CHEST - 1 VIEW

[view not recorded]
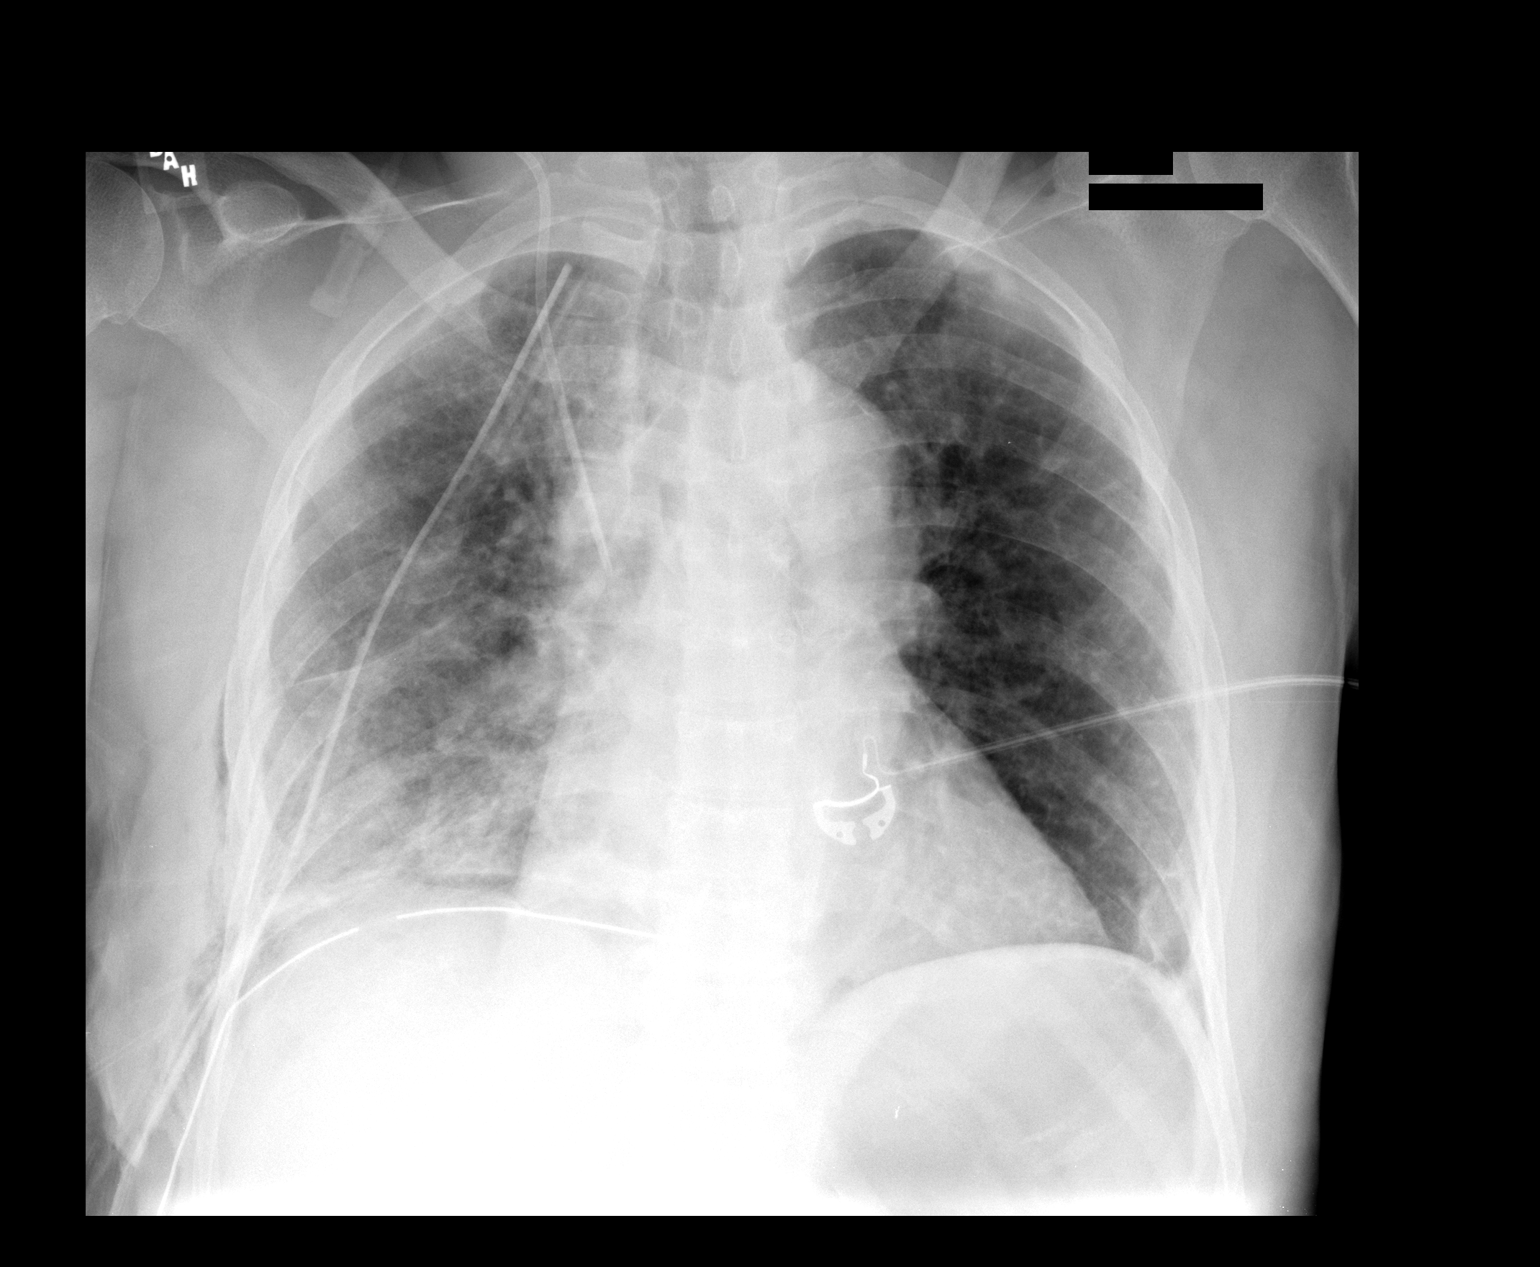

[1 of 1 positions shown; findings below may reference images not displayed]

FINDINGS: The right IJ central line tip is at the cavoatrial
junction.  Two right chest tubes are in place.  There is a small
right pleural effusion, improved.  No pneumothorax.
IMPRESSION: No pneumothorax.

Small right pleural effusion has lessened.

## 2012-08-09 ENCOUNTER — Other Ambulatory Visit (HOSPITAL_COMMUNITY): Payer: Self-pay | Admitting: Cardiology

## 2012-08-09 DIAGNOSIS — Z1231 Encounter for screening mammogram for malignant neoplasm of breast: Secondary | ICD-10-CM

## 2012-08-09 IMAGING — CR DG CHEST 1V PORT
1 series · 1 of 1 positions shown · non-contrast
Comparison: Yesterday

CLINICAL DATA: Thoracotomy

PORTABLE CHEST - 1 VIEW

[view not recorded]
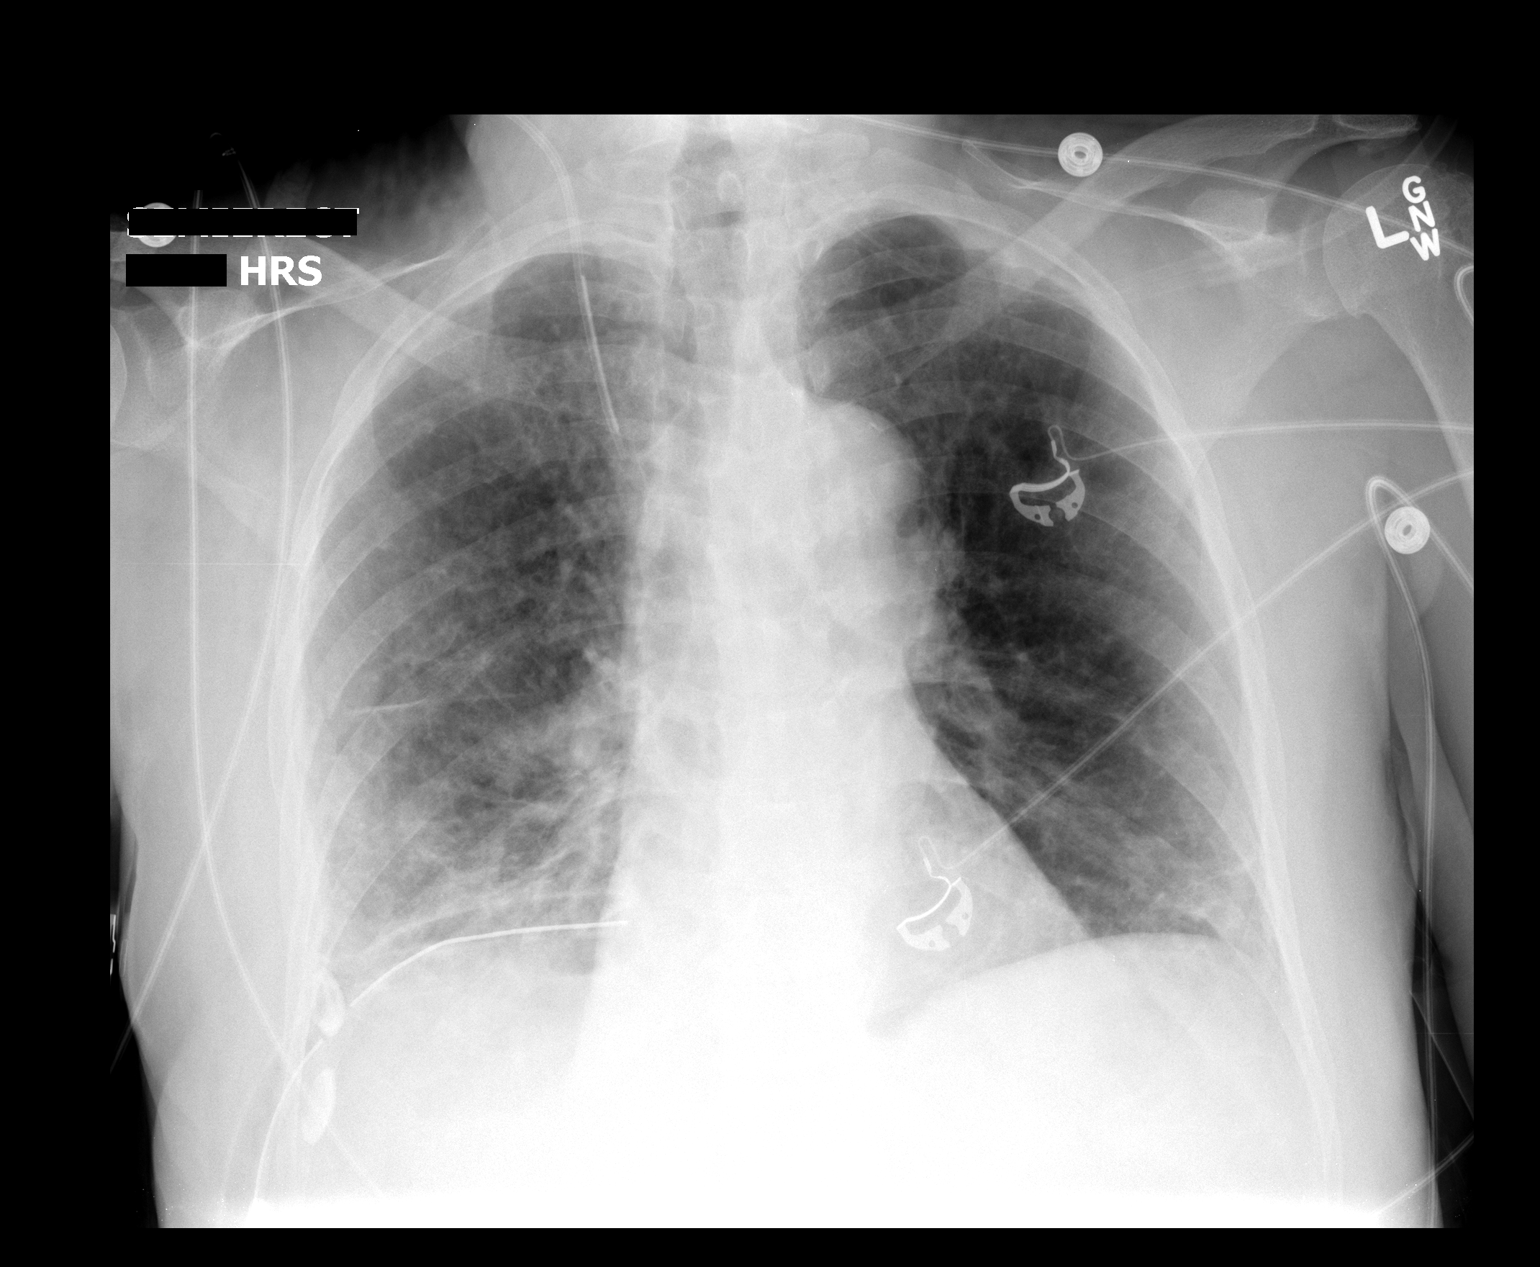

[1 of 1 positions shown; findings below may reference images not displayed]

FINDINGS: One of the right chest tube has been removed with the
basilar chest tube left in place.  No pneumothorax.  Heterogeneous
opacities throughout the right lung and left base have increased.
No pneumothorax.  Upper normal heart size.  Stable right internal
jugular vein catheter.
IMPRESSION: Worsening diffuse edema.

One of the right chest tubes has been removed without a developing
pneumothorax.

## 2012-08-19 ENCOUNTER — Ambulatory Visit (HOSPITAL_COMMUNITY)
Admission: RE | Admit: 2012-08-19 | Discharge: 2012-08-19 | Disposition: A | Discharge status: Home / Self Care | Payer: Medicare Other | Source: Ambulatory Visit | Attending: Cardiology | Admitting: Cardiology

## 2012-08-19 DIAGNOSIS — Z1231 Encounter for screening mammogram for malignant neoplasm of breast: Secondary | ICD-10-CM | POA: Insufficient documentation

## 2012-08-31 ENCOUNTER — Other Ambulatory Visit (HOSPITAL_COMMUNITY)
Admission: RE | Admit: 2012-08-31 | Discharge: 2012-08-31 | Disposition: A | Discharge status: Home / Self Care | Payer: Medicare Other | Source: Ambulatory Visit | Attending: Obstetrics & Gynecology | Admitting: Obstetrics & Gynecology

## 2012-08-31 ENCOUNTER — Ambulatory Visit (INDEPENDENT_AMBULATORY_CARE_PROVIDER_SITE_OTHER): Payer: Medicare Other | Admitting: Obstetrics & Gynecology

## 2012-08-31 ENCOUNTER — Encounter: Payer: Self-pay | Admitting: Obstetrics & Gynecology

## 2012-08-31 VITALS — BP 112/81 | HR 112 | Temp 98.4°F | Resp 20 | Ht 63.0 in | Wt 156.4 lb

## 2012-08-31 DIAGNOSIS — B373 Candidiasis of vulva and vagina: Secondary | ICD-10-CM

## 2012-08-31 DIAGNOSIS — Z01419 Encounter for gynecological examination (general) (routine) without abnormal findings: Secondary | ICD-10-CM

## 2012-08-31 DIAGNOSIS — N898 Other specified noninflammatory disorders of vagina: Secondary | ICD-10-CM

## 2012-08-31 DIAGNOSIS — Z124 Encounter for screening for malignant neoplasm of cervix: Secondary | ICD-10-CM | POA: Insufficient documentation

## 2012-08-31 DIAGNOSIS — Z1151 Encounter for screening for human papillomavirus (HPV): Secondary | ICD-10-CM | POA: Insufficient documentation

## 2012-08-31 DIAGNOSIS — N899 Noninflammatory disorder of vagina, unspecified: Secondary | ICD-10-CM

## 2012-08-31 MED ORDER — FLUCONAZOLE 150 MG PO TABS: 150.0000 mg | ORAL_TABLET | Freq: Once | ORAL | Status: DC

## 2012-08-31 NOTE — Patient Instructions (Signed)
Preventive Care for Adults, Female A healthy lifestyle and preventive care can promote health and wellness. Preventive health guidelines for women include the following key practices.  A routine yearly physical is a good way to check with your caregiver about your health and preventive screening. It is a chance to share any concerns and updates on your health, and to receive a thorough exam.  Visit your dentist for a routine exam and preventive care every 6 months. Brush your teeth twice a day and floss once a day. Good oral hygiene prevents tooth decay and gum disease.  The frequency of eye exams is based on your age, health, family medical history, use of contact lenses, and other factors. Follow your caregiver's recommendations for frequency of eye exams.  Eat a healthy diet. Foods like vegetables, fruits, whole grains, low-fat dairy products, and lean protein foods contain the nutrients you need without too many calories. Decrease your intake of foods high in solid fats, added sugars, and salt. Eat the right amount of calories for you.Get information about a proper diet from your caregiver, if necessary.  Regular physical exercise is one of the most important things you can do for your health. Most adults should get at least 150 minutes of moderate-intensity exercise (any activity that increases your heart rate and causes you to sweat) each week. In addition, most adults need muscle-strengthening exercises on 2 or more days a week.  Maintain a healthy weight. The body mass index (BMI) is a screening tool to identify possible weight problems. It provides an estimate of body fat based on height and weight. Your caregiver can help determine your BMI, and can help you achieve or maintain a healthy weight.For adults 20 years and older:  A BMI below 18.5 is considered underweight.  A BMI of 18.5 to 24.9 is normal.  A BMI of 25 to 29.9 is considered overweight.  A BMI of 30 and above is  considered obese.  Maintain normal blood lipids and cholesterol levels by exercising and minimizing your intake of saturated fat. Eat a balanced diet with plenty of fruit and vegetables. Blood tests for lipids and cholesterol should begin at age 20 and be repeated every 5 years. If your lipid or cholesterol levels are high, you are over 50, or you are at high risk for heart disease, you may need your cholesterol levels checked more frequently.Ongoing high lipid and cholesterol levels should be treated with medicines if diet and exercise are not effective.  If you smoke, find out from your caregiver how to quit. If you do not use tobacco, do not start.  If you are pregnant, do not drink alcohol. If you are breastfeeding, be very cautious about drinking alcohol. If you are not pregnant and choose to drink alcohol, do not exceed 1 drink per day. One drink is considered to be 12 ounces (355 mL) of beer, 5 ounces (148 mL) of wine, or 1.5 ounces (44 mL) of liquor.  Avoid use of street drugs. Do not share needles with anyone. Ask for help if you need support or instructions about stopping the use of drugs.  High blood pressure causes heart disease and increases the risk of stroke. Your blood pressure should be checked at least every 1 to 2 years. Ongoing high blood pressure should be treated with medicines if weight loss and exercise are not effective.  If you are 55 to 65 years old, ask your caregiver if you should take aspirin to prevent strokes.  Diabetes   screening involves taking a blood sample to check your fasting blood sugar level. This should be done once every 3 years, after age 45, if you are within normal weight and without risk factors for diabetes. Testing should be considered at a younger age or be carried out more frequently if you are overweight and have at least 1 risk factor for diabetes.  Breast cancer screening is essential preventive care for women. You should practice "breast  self-awareness." This means understanding the normal appearance and feel of your breasts and may include breast self-examination. Any changes detected, no matter how small, should be reported to a caregiver. Women in their 20s and 30s should have a clinical breast exam (CBE) by a caregiver as part of a regular health exam every 1 to 3 years. After age 40, women should have a CBE every year. Starting at age 40, women should consider having a mammography (breast X-ray test) every year. Women who have a family history of breast cancer should talk to their caregiver about genetic screening. Women at a high risk of breast cancer should talk to their caregivers about having magnetic resonance imaging (MRI) and a mammography every year.  The Pap test is a screening test for cervical cancer. A Pap test can show cell changes on the cervix that might become cervical cancer if left untreated. A Pap test is a procedure in which cells are obtained and examined from the lower end of the uterus (cervix).  Women should have a Pap test starting at age 21.  Between ages 21 and 29, Pap tests should be repeated every 2 years.  Beginning at age 30, you should have a Pap test every 3 years as long as the past 3 Pap tests have been normal.  Some women have medical problems that increase the chance of getting cervical cancer. Talk to your caregiver about these problems. It is especially important to talk to your caregiver if a new problem develops soon after your last Pap test. In these cases, your caregiver may recommend more frequent screening and Pap tests.  The above recommendations are the same for women who have or have not gotten the vaccine for human papillomavirus (HPV).  If you had a hysterectomy for a problem that was not cancer or a condition that could lead to cancer, then you no longer need Pap tests. Even if you no longer need a Pap test, a regular exam is a good idea to make sure no other problems are  starting.  If you are between ages 65 and 70, and you have had normal Pap tests going back 10 years, you no longer need Pap tests. Even if you no longer need a Pap test, a regular exam is a good idea to make sure no other problems are starting.  If you have had past treatment for cervical cancer or a condition that could lead to cancer, you need Pap tests and screening for cancer for at least 20 years after your treatment.  If Pap tests have been discontinued, risk factors (such as a new sexual partner) need to be reassessed to determine if screening should be resumed.  The HPV test is an additional test that may be used for cervical cancer screening. The HPV test looks for the virus that can cause the cell changes on the cervix. The cells collected during the Pap test can be tested for HPV. The HPV test could be used to screen women aged 30 years and older, and should   be used in women of any age who have unclear Pap test results. After the age of 30, women should have HPV testing at the same frequency as a Pap test.  Colorectal cancer can be detected and often prevented. Most routine colorectal cancer screening begins at the age of 50 and continues through age 75. However, your caregiver may recommend screening at an earlier age if you have risk factors for colon cancer. On a yearly basis, your caregiver may provide home test kits to check for hidden blood in the stool. Use of a small camera at the end of a tube, to directly examine the colon (sigmoidoscopy or colonoscopy), can detect the earliest forms of colorectal cancer. Talk to your caregiver about this at age 50, when routine screening begins. Direct examination of the colon should be repeated every 5 to 10 years through age 75, unless early forms of pre-cancerous polyps or small growths are found.  Hepatitis C blood testing is recommended for all people born from 1945 through 1965 and any individual with known risks for hepatitis C.  Practice  safe sex. Use condoms and avoid high-risk sexual practices to reduce the spread of sexually transmitted infections (STIs). STIs include gonorrhea, chlamydia, syphilis, trichomonas, herpes, HPV, and human immunodeficiency virus (HIV). Herpes, HIV, and HPV are viral illnesses that have no cure. They can result in disability, cancer, and death. Sexually active women aged 25 and younger should be checked for chlamydia. Older women with new or multiple partners should also be tested for chlamydia. Testing for other STIs is recommended if you are sexually active and at increased risk.  Osteoporosis is a disease in which the bones lose minerals and strength with aging. This can result in serious bone fractures. The risk of osteoporosis can be identified using a bone density scan. Women ages 65 and over and women at risk for fractures or osteoporosis should discuss screening with their caregivers. Ask your caregiver whether you should take a calcium supplement or vitamin D to reduce the rate of osteoporosis.  Menopause can be associated with physical symptoms and risks. Hormone replacement therapy is available to decrease symptoms and risks. You should talk to your caregiver about whether hormone replacement therapy is right for you.  Use sunscreen with sun protection factor (SPF) of 30 or more. Apply sunscreen liberally and repeatedly throughout the day. You should seek shade when your shadow is shorter than you. Protect yourself by wearing long sleeves, pants, a wide-brimmed hat, and sunglasses year round, whenever you are outdoors.  Once a month, do a whole body skin exam, using a mirror to look at the skin on your back. Notify your caregiver of new moles, moles that have irregular borders, moles that are larger than a pencil eraser, or moles that have changed in shape or color.  Stay current with required immunizations.  Influenza. You need a dose every fall (or winter). The composition of the flu vaccine  changes each year, so being vaccinated once is not enough.  Pneumococcal polysaccharide. You need 1 to 2 doses if you smoke cigarettes or if you have certain chronic medical conditions. You need 1 dose at age 65 (or older) if you have never been vaccinated.  Tetanus, diphtheria, pertussis (Tdap, Td). Get 1 dose of Tdap vaccine if you are younger than age 65, are over 65 and have contact with an infant, are a healthcare worker, are pregnant, or simply want to be protected from whooping cough. After that, you need a Td   booster dose every 10 years. Consult your caregiver if you have not had at least 3 tetanus and diphtheria-containing shots sometime in your life or have a deep or dirty wound.  HPV. You need this vaccine if you are a woman age 26 or younger. The vaccine is given in 3 doses over 6 months.  Measles, mumps, rubella (MMR). You need at least 1 dose of MMR if you were born in 1957 or later. You may also need a second dose.  Meningococcal. If you are age 19 to 21 and a first-year college student living in a residence hall, or have one of several medical conditions, you need to get vaccinated against meningococcal disease. You may also need additional booster doses.  Zoster (shingles). If you are age 60 or older, you should get this vaccine.  Varicella (chickenpox). If you have never had chickenpox or you were vaccinated but received only 1 dose, talk to your caregiver to find out if you need this vaccine.  Hepatitis A. You need this vaccine if you have a specific risk factor for hepatitis A virus infection or you simply wish to be protected from this disease. The vaccine is usually given as 2 doses, 6 to 18 months apart.  Hepatitis B. You need this vaccine if you have a specific risk factor for hepatitis B virus infection or you simply wish to be protected from this disease. The vaccine is given in 3 doses, usually over 6 months. Preventive Services / Frequency Ages 19 to 39  Blood  pressure check.** / Every 1 to 2 years.  Lipid and cholesterol check.** / Every 5 years beginning at age 20.  Clinical breast exam.** / Every 3 years for women in their 20s and 30s.  Pap test.** / Every 2 years from ages 21 through 29. Every 3 years starting at age 30 through age 65 or 70 with a history of 3 consecutive normal Pap tests.  HPV screening.** / Every 3 years from ages 30 through ages 65 to 70 with a history of 3 consecutive normal Pap tests.  Hepatitis C blood test.** / For any individual with known risks for hepatitis C.  Skin self-exam. / Monthly.  Influenza immunization.** / Every year.  Pneumococcal polysaccharide immunization.** / 1 to 2 doses if you smoke cigarettes or if you have certain chronic medical conditions.  Tetanus, diphtheria, pertussis (Tdap, Td) immunization. / A one-time dose of Tdap vaccine. After that, you need a Td booster dose every 10 years.  HPV immunization. / 3 doses over 6 months, if you are 26 and younger.  Measles, mumps, rubella (MMR) immunization. / You need at least 1 dose of MMR if you were born in 1957 or later. You may also need a second dose.  Meningococcal immunization. / 1 dose if you are age 19 to 21 and a first-year college student living in a residence hall, or have one of several medical conditions, you need to get vaccinated against meningococcal disease. You may also need additional booster doses.  Varicella immunization.** / Consult your caregiver.  Hepatitis A immunization.** / Consult your caregiver. 2 doses, 6 to 18 months apart.  Hepatitis B immunization.** / Consult your caregiver. 3 doses usually over 6 months. Ages 40 to 64  Blood pressure check.** / Every 1 to 2 years.  Lipid and cholesterol check.** / Every 5 years beginning at age 20.  Clinical breast exam.** / Every year after age 40.  Mammogram.** / Every year beginning at age 40   and continuing for as long as you are in good health. Consult with your  caregiver.  Pap test.** / Every 3 years starting at age 30 through age 65 or 70 with a history of 3 consecutive normal Pap tests.  HPV screening.** / Every 3 years from ages 30 through ages 65 to 70 with a history of 3 consecutive normal Pap tests.  Fecal occult blood test (FOBT) of stool. / Every year beginning at age 50 and continuing until age 75. You may not need to do this test if you get a colonoscopy every 10 years.  Flexible sigmoidoscopy or colonoscopy.** / Every 5 years for a flexible sigmoidoscopy or every 10 years for a colonoscopy beginning at age 50 and continuing until age 75.  Hepatitis C blood test.** / For all people born from 1945 through 1965 and any individual with known risks for hepatitis C.  Skin self-exam. / Monthly.  Influenza immunization.** / Every year.  Pneumococcal polysaccharide immunization.** / 1 to 2 doses if you smoke cigarettes or if you have certain chronic medical conditions.  Tetanus, diphtheria, pertussis (Tdap, Td) immunization.** / A one-time dose of Tdap vaccine. After that, you need a Td booster dose every 10 years.  Measles, mumps, rubella (MMR) immunization. / You need at least 1 dose of MMR if you were born in 1957 or later. You may also need a second dose.  Varicella immunization.** / Consult your caregiver.  Meningococcal immunization.** / Consult your caregiver.  Hepatitis A immunization.** / Consult your caregiver. 2 doses, 6 to 18 months apart.  Hepatitis B immunization.** / Consult your caregiver. 3 doses, usually over 6 months. Ages 65 and over  Blood pressure check.** / Every 1 to 2 years.  Lipid and cholesterol check.** / Every 5 years beginning at age 20.  Clinical breast exam.** / Every year after age 40.  Mammogram.** / Every year beginning at age 40 and continuing for as long as you are in good health. Consult with your caregiver.  Pap test.** / Every 3 years starting at age 30 through age 65 or 70 with a 3  consecutive normal Pap tests. Testing can be stopped between 65 and 70 with 3 consecutive normal Pap tests and no abnormal Pap or HPV tests in the past 10 years.  HPV screening.** / Every 3 years from ages 30 through ages 65 or 70 with a history of 3 consecutive normal Pap tests. Testing can be stopped between 65 and 70 with 3 consecutive normal Pap tests and no abnormal Pap or HPV tests in the past 10 years.  Fecal occult blood test (FOBT) of stool. / Every year beginning at age 50 and continuing until age 75. You may not need to do this test if you get a colonoscopy every 10 years.  Flexible sigmoidoscopy or colonoscopy.** / Every 5 years for a flexible sigmoidoscopy or every 10 years for a colonoscopy beginning at age 50 and continuing until age 75.  Hepatitis C blood test.** / For all people born from 1945 through 1965 and any individual with known risks for hepatitis C.  Osteoporosis screening.** / A one-time screening for women ages 65 and over and women at risk for fractures or osteoporosis.  Skin self-exam. / Monthly.  Influenza immunization.** / Every year.  Pneumococcal polysaccharide immunization.** / 1 dose at age 65 (or older) if you have never been vaccinated.  Tetanus, diphtheria, pertussis (Tdap, Td) immunization. / A one-time dose of Tdap vaccine if you are over   65 and have contact with an infant, are a healthcare worker, or simply want to be protected from whooping cough. After that, you need a Td booster dose every 10 years.  Varicella immunization.** / Consult your caregiver.  Meningococcal immunization.** / Consult your caregiver.  Hepatitis A immunization.** / Consult your caregiver. 2 doses, 6 to 18 months apart.  Hepatitis B immunization.** / Check with your caregiver. 3 doses, usually over 6 months. ** Family history and personal history of risk and conditions may change your caregiver's recommendations. Document Released: 06/30/2001 Document Revised: 07/27/2011  Document Reviewed: 09/29/2010 [employer] Patient Information 2013 [employer].  

## 2012-08-31 NOTE — Progress Notes (Signed)
Pt here for annual Gyn exam. States she is having vaginal pain.

## 2012-08-31 NOTE — Progress Notes (Signed)
  Subjective:     [name] is a 67 y.o. [id number] female and is here for a gynecologic exam. The patient reports vaginal irritation for a few days with white discharge.  Not sexually active for over 20 years. No abnormal bleeding or other GYN concerns.  History   Social History  . Marital Status: Single    Spouse Name: N/A    Number of Children: N/A  . Years of Education: N/A   Occupational History  . Not on file.   Social History Main Topics  . Smoking status: Current Every Day Smoker -- 0.25 packs/day for 40 years    Types: Cigarettes  . 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 Alcohol Use: No  . Drug Use: No  . Sexually Active: No     Comment: no sex x 20 yrs   Other Topics Concern  . Not on file   Social History Narrative  . No narrative on file   Health Maintenance  Topic Date Due  . Colonoscopy  08/26/1997  . Zostavax  08/27/2007  . Pneumococcal Polysaccharide Vaccine Age 62 And Over  08/26/2012  . Influenza Vaccine  01/16/2013  . Mammogram  08/20/2014  . Tetanus/tdap  02/21/2019   The following portions of the patient's history were reviewed and updated as appropriate: allergies, current medications, past family history, past medical history, past social history, past surgical history and problem list. Normal mammogram on 08/22/12.   Review of Systems Pertinent items are noted in HPI.   Objective:   BP 112/81  Pulse 112  Temp(Src) 98.4 F (36.9 C) (Oral)  Resp 20  Ht 5\' 3"  (1.6 m)  Wt 156 lb 6.4 oz (70.943 kg)  BMI 27.71 kg/m2 GENERAL: Well-developed, well-nourished female in no acute distress.  HEENT: Normocephalic, atraumatic. Sclerae anicteric.  NECK: Supple. Normal thyroid.  LUNGS: Clear to auscultation bilaterally.  HEART: Regular rate and rhythm. BREASTS: Symmetric in size. No masses, skin changes, nipple drainage, or lymphadenopathy. ABDOMEN: Soft, nontender,  nondistended. No organomegaly. PELVIC: Normal external female genitalia. Vagina is atrophic  Thick white discharge seen. Normal cervix contour. Pap smear obtained. Uterus is normal in size. No adnexal mass or tenderness.  EXTREMITIES: No cyanosis, clubbing, or edema, 2+ distal pulses.   Assessment:   Healthy female exam.  Vaginal irritation     Plan:   Pap and wet prep done, will follow up results and manage accordingly. Diflucan ordered. Routine preventative health maintenance measures emphasized Follow up with PCP for other issues.

## 2012-09-01 ENCOUNTER — Telehealth: Payer: Self-pay | Admitting: General Practice

## 2012-09-01 LAB — WET PREP, GENITAL
Clue Cells Wet Prep HPF POC: NONE SEEN
Trich, Wet Prep: NONE SEEN
Yeast Wet Prep HPF POC: NONE SEEN

## 2012-09-01 NOTE — Telephone Encounter (Signed)
Called patient and informed her of results. Patient asked if the pap smear result was back yet and I said no that it would probably be another week or so before it was back. Patient verbalized understanding to all and had no further questions

## 2012-09-01 NOTE — Telephone Encounter (Signed)
Message copied by Kathee Delton on Thu Sep 01, 2012 11:44 AM ------      Message from: Jaynie Collins A      Created: Thu Sep 01, 2012 10:32 AM       Normal wet prep.  Please call to inform patient of results.       ------

## 2012-11-03 ENCOUNTER — Inpatient Hospital Stay (HOSPITAL_COMMUNITY)
Admission: AD | Admit: 2012-11-03 | Discharge: 2012-11-03 | Disposition: A | Discharge status: Home / Self Care | Payer: Medicare Other | Source: Ambulatory Visit | Attending: Obstetrics and Gynecology | Admitting: Obstetrics and Gynecology

## 2012-11-03 ENCOUNTER — Encounter (HOSPITAL_COMMUNITY): Payer: Self-pay

## 2012-11-03 DIAGNOSIS — M549 Dorsalgia, unspecified: Secondary | ICD-10-CM | POA: Insufficient documentation

## 2012-11-03 DIAGNOSIS — R51 Headache: Secondary | ICD-10-CM | POA: Insufficient documentation

## 2012-11-03 DIAGNOSIS — G8929 Other chronic pain: Secondary | ICD-10-CM | POA: Insufficient documentation

## 2012-11-03 HISTORY — DX: Pneumonia, unspecified organism: J18.9

## 2012-11-03 LAB — URINALYSIS, ROUTINE W REFLEX MICROSCOPIC
Glucose, UA: NEGATIVE mg/dL
Ketones, ur: NEGATIVE mg/dL
Leukocytes, UA: NEGATIVE
Protein, ur: NEGATIVE mg/dL
Urobilinogen, UA: 0.2 mg/dL (ref 0.0–1.0)

## 2012-11-03 LAB — URINE MICROSCOPIC-ADD ON

## 2012-11-03 MED ORDER — KETOROLAC TROMETHAMINE 60 MG/2ML IM SOLN
60.0000 mg | Freq: Once | INTRAMUSCULAR | Status: AC
  Administered 2012-11-03: 60 mg via INTRAMUSCULAR
  Filled 2012-11-03: qty 2

## 2012-11-03 MED ORDER — KETOROLAC TROMETHAMINE 10 MG PO TABS: 10.0000 mg | ORAL_TABLET | Freq: Four times a day (QID) | ORAL | Status: DC | PRN

## 2012-11-03 NOTE — MAU Provider Note (Signed)
History     CSN: [id number]  Arrival date and time: 11/03/12 1018   None     Chief Complaint  Patient presents with  . Headache  . Back Pain  . Flank Pain   HPI  Pt is not pregnant and presents with back pain and migraine  which she has every day but worse today.  She also complains of right low chest pain where pt has hx of pneumonia and ?pneumothorax. Pt denies pain with breathing. She has taken tylenol and Advil for her headche.  She hurtt from her head all down her back.  She also complains of throat pain radiating down chest.  Pt has light sensitivity and has blurred vision.  Pt has not had nausea or vomiting.  She has had some constipation and takes a stool softener.  Pt states she has " fever" when her head hurts.  Pt has hx of spinal stenosis-cervical and lumbar-  Pt does not have a PCP at this time.  She has a cardiologist Dr. Marni Griffon and treats for asthma and high blood pressure. Pt states that she came to [facility] because she did not have enough gas in her car to go to [facility] or [facility]  RN note:Patient is a pleasant 67 years old, who is in with c/o headache (lower back of her head), lower back pain (this is chronic, had back surgeries years ago) and right lateral side pain (patient states that it is the side she had PNA that was aspirated). She is requesting if XRAY and CT scan to make make sure that she is okay. She states that she gets no relief from any pain medication. She denies any vaginal bleeding or nausea.   Past Medical History  Diagnosis Date  . TOBACCO ABUSE   . SPINAL STENOSIS, LUMBAR     MRI 12/2005; surg 2004  . ALLERGIC RHINITIS   . ASTHMA, UNSPECIFIED, UNSPECIFIED STATUS   . HYPERTENSION   . DYSLIPIDEMIA   . Mild renal insufficiency   . PNA (pneumonia)   . [id number](308.6)     Past Surgical History  Procedure Laterality Date  . Decompressive laminectomy      Fusion L4-5, L5-S1 ([facility]-2004)  . Right hand  1995  . Right knee  1990  . Right  video-assisted thoracoscopic surgery, drainage of empyema.  02/12/2011  . Decortication of right lower lobe    . Closure of bronchopleural fistula, right lower lobe.    . Placement of wound on-q analgesia irrigation system.      Family History  Problem Relation Age of Onset  . Arthritis Other   . Hypertension Other   . Mental illness Mother   . Hypertension Mother     History  Substance Use Topics  . Smoking status: Current Every Day Smoker -- 0.25 packs/day for 40 years    Types: Cigarettes  . 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 Alcohol Use: No    Allergies: No Known Allergies  Prescriptions prior to admission  Medication Sig Dispense Refill  . alendronate (FOSAMAX) 70 MG tablet Take 70 mg by mouth every 7 (seven) days. Every tuesdays.Marland KitchenMarland KitchenMarland KitchenTake in the morning with a full glass of water, on an empty stomach, and do not take anything else by mouth or lie down for the next 30 min.      . diazepam (VALIUM) 5 MG tablet Take 1 tablet (5 mg total)  by mouth 2 (two) times daily.  15 tablet  0  . diltiazem (CARDIZEM CD) 240 MG 24 hr capsule Take 240 mg by mouth daily.      [name] diltiazem (DILACOR XR) 240 MG 24 hr capsule Take 240 mg by mouth 2 (two) times daily.       . fluconazole (DIFLUCAN) 150 MG tablet Take 1 tablet (150 mg total) by mouth once.  1 tablet  6  . Fluticasone-Salmeterol (ADVAIR DISKUS) 250-50 MCG/DOSE AEPB Inhale 1 puff into the lungs 2 (two) times daily.        . furosemide (LASIX) 20 MG tablet Take 20 mg by mouth daily.       [name] ibuprofen (ADVIL,MOTRIN) 200 MG tablet Take 200 mg by mouth every 6 (six) hours as needed. Pain      . losartan (COZAAR) 100 MG tablet Take 100 mg by mouth daily.       . minoxidil (LONITEN) 2.5 MG tablet Take 2.5 mg by mouth daily.      . montelukast (SINGULAIR) 10 MG tablet take 1 tablet by mouth once daily  30 tablet  3  . pantoprazole (PROTONIX) 40 MG tablet Take 40 mg  by mouth daily.        . rosuvastatin (CRESTOR) 10 MG tablet Take 10 mg by mouth daily.       [name] tiotropium (SPIRIVA) 18 MCG inhalation capsule Place 18 mcg into inhaler and inhale daily.        . traMADol (ULTRAM) 50 MG tablet Take 50 mg by mouth every 6 (six) hours as needed. pain        Review of Systems  Constitutional: Negative for fever and chills.  HENT: Positive for congestion, sore throat and neck pain.   Eyes: Positive for blurred vision.  Respiratory: Negative for cough.   Cardiovascular: Negative for orthopnea.  Gastrointestinal: Negative for nausea, vomiting, abdominal pain, diarrhea and constipation.  Genitourinary: Negative for dysuria and urgency.  Musculoskeletal: Positive for back pain.  Neurological: Positive for headaches. Negative for dizziness, sensory change, speech change and focal weakness.   Physical Exam   Blood pressure 124/75, pulse 97, temperature 98.3 F (36.8 C), temperature source Oral, resp. rate 16, height 5\' 4"  (1.626 m), weight 71.215 kg (157 lb).  Physical Exam  Vitals reviewed. Constitutional: She is oriented to person, place, and time. She appears well-developed and well-nourished. No distress.  HENT:  Head: Normocephalic.  Eyes: Pupils are equal, round, and reactive to light.  Neck: Normal range of motion. Neck supple.  Cardiovascular: Normal rate.   Respiratory: Effort normal.  GI: Soft.  Musculoskeletal: Normal range of motion.  Neurological: She is alert and oriented to person, place, and time.  Skin: Skin is warm and dry.  Psychiatric: She has a normal mood and affect.    MAU Course  Procedures Discussed with pt that we can give her some medication to try to help with her headache but then would prefer to transfer pt to [facility] ED for further evaluation.- pt does not want to be transferred Pt states that she has this chronic headache/migraine and would prefer some medication for relief and then she will go to [facility] for further evaluation  when her daughter can go with her. Pt wanting medication for headache- she got relief with Toradol 60mg  IM, so will give limited prescription for Toradol 10mg  PO- pt instructed to f/u with PCP- if increase in headache esp unilateral or worse ever headache to go to [facility] ED  Assessment and Plan  Chronic headache Chronic back pain F/u with PCP  Laurelle Skiver 11/03/2012, 11:11 AM

## 2012-11-03 NOTE — MAU Note (Signed)
Patient is a pleasant 67 years old, who is in with c/o headache (lower back of her head), lower back pain (this is chronic, had back surgeries years ago) and right lateral side pain (patient states that it is the side she had PNA that was aspirated). She is requesting if XRAY and CT scan to make make sure that she is okay. She states that she gets no relief from any pain medication. She denies any vaginal bleeding or nausea.

## 2012-11-07 NOTE — MAU Provider Note (Signed)
Attestation of Attending Supervision of Advanced Practitioner (CNM/NP): Evaluation and management procedures were performed by the Advanced Practitioner under my supervision and collaboration.  I have reviewed the Advanced Practitioner's note and chart, and I agree with the management and plan.  Erisa Mehlman 11/07/2012 12:12 PM

## 2012-11-08 ENCOUNTER — Emergency Department (INDEPENDENT_AMBULATORY_CARE_PROVIDER_SITE_OTHER)
Admission: EM | Admit: 2012-11-08 | Discharge: 2012-11-08 | Disposition: A | Discharge status: Home / Self Care | Payer: Medicare Other | Source: Home / Self Care

## 2012-11-08 ENCOUNTER — Encounter (HOSPITAL_COMMUNITY): Payer: Self-pay | Admitting: Emergency Medicine

## 2012-11-08 DIAGNOSIS — R51 Headache: Secondary | ICD-10-CM

## 2012-11-08 DIAGNOSIS — J309 Allergic rhinitis, unspecified: Secondary | ICD-10-CM

## 2012-11-08 DIAGNOSIS — M791 Myalgia, unspecified site: Secondary | ICD-10-CM

## 2012-11-08 DIAGNOSIS — R519 Headache, unspecified: Secondary | ICD-10-CM

## 2012-11-08 DIAGNOSIS — M609 Myositis, unspecified: Secondary | ICD-10-CM

## 2012-11-08 DIAGNOSIS — IMO0001 Reserved for inherently not codable concepts without codable children: Secondary | ICD-10-CM

## 2012-11-08 MED ORDER — KETOROLAC TROMETHAMINE 30 MG/ML IJ SOLN
INTRAMUSCULAR | Status: AC
  Filled 2012-11-08: qty 1

## 2012-11-08 MED ORDER — KETOROLAC TROMETHAMINE 30 MG/ML IJ SOLN
30.0000 mg | Freq: Once | INTRAMUSCULAR | Status: AC
  Administered 2012-11-08: 30 mg via INTRAMUSCULAR

## 2012-11-08 MED ORDER — TRAMADOL HCL 50 MG PO TABS: 50.0000 mg | ORAL_TABLET | Freq: Four times a day (QID) | ORAL | Status: DC | PRN

## 2012-11-08 MED ORDER — CYCLOBENZAPRINE HCL 5 MG PO TABS: 5.0000 mg | ORAL_TABLET | Freq: Two times a day (BID) | ORAL | Status: DC | PRN

## 2012-11-08 NOTE — ED Notes (Signed)
Patient is talking excessively about needing allergy shot and many other thoughts.  Strongly encouraged patient to follow up with pcp or [facility].

## 2012-11-08 NOTE — ED Provider Notes (Signed)
History    CSN: [id number] Arrival date & time 11/08/12  1447  First MD Initiated Contact with Patient 11/08/12 1540     Chief Complaint  Patient presents with  . Back Pain   (Consider location/radiation/quality/duration/timing/severity/associated sxs/prior Treatment) HPI Comments: 67 year old female presents with back pain begins in the occiput and is referred inferiorly to the buttocks. The pain is diffuse within the cervical muscles and muscles of the upper and lower back. She has a history of lumbar spinal stenosis and had surgery in 2004. She is also complaining of pain in the right shoulder that began this morning. She denies any known injury. She is complaining of headache. The cause of vision, speech, hearing, swallowing or focal weakness. The symptoms are acute on chronic and worsening approximately a week and half ago.  Past Medical History  Diagnosis Date  . TOBACCO ABUSE   . SPINAL STENOSIS, LUMBAR     MRI 12/2005; surg 2004  . ALLERGIC RHINITIS   . ASTHMA, UNSPECIFIED, UNSPECIFIED STATUS   . HYPERTENSION   . DYSLIPIDEMIA   . Mild renal insufficiency   . PNA (pneumonia)   . [id number](478.2)    Past Surgical History  Procedure Laterality Date  . Decompressive laminectomy      Fusion L4-5, L5-S1 ([facility]-2004)  . Right hand  1995  . Right knee  1990  . Right video-assisted thoracoscopic surgery, drainage of empyema.  02/12/2011  . Decortication of right lower lobe    . Closure of bronchopleural fistula, right lower lobe.    . Placement of wound on-q analgesia irrigation system.     Family History  Problem Relation Age of Onset  . Arthritis Other   . Hypertension Other   . Mental illness Mother   . Hypertension Mother    History  Substance Use Topics  . Smoking status: Current Every Day Smoker -- 0.25 packs/day for 40 years    Types: Cigarettes  . 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  Alcohol Use: No   OB History   Grav Para Term Preterm Abortions TAB SAB Ect Mult Living   5 4 4  1 1    4      Review of Systems  Constitutional: Negative for fever, chills and activity change.  HENT: Positive for sore throat, rhinorrhea and postnasal drip.   Respiratory: Positive for cough. Negative for shortness of breath and wheezing.   Cardiovascular: Negative.   Gastrointestinal: Negative.   Musculoskeletal:       As per HPI  Skin: Negative for color change, pallor and rash.  Neurological: Negative.     Allergies  Review of patient's allergies indicates no known allergies.  Home Medications   Current Outpatient Rx  Name  Route  Sig  Dispense  Refill  . alendronate (FOSAMAX) 70 MG tablet   Oral   Take 70 mg by mouth every 7 (seven) days. Every tuesdays.Marland KitchenMarland KitchenMarland KitchenTake in the morning with a full glass of water, on an empty stomach, and do not take anything else by mouth or lie down for the next 30 min.         . cyclobenzaprine (FLEXERIL) 5 MG tablet   Oral   Take 1 tablet (5 mg total) by mouth 2 (two) times daily as needed for muscle spasms. Will cause drowsiness   20 tablet   0   . diltiazem (CARDIZEM CD) 240 MG 24 hr  capsule   Oral   Take 240 mg by mouth daily.         . Fluticasone-Salmeterol (ADVAIR DISKUS) 250-50 MCG/DOSE AEPB   Inhalation   Inhale 1 puff into the lungs 2 (two) times daily.           . furosemide (LASIX) 20 MG tablet   Oral   Take 20 mg by mouth daily.          [name] ibuprofen (ADVIL,MOTRIN) 200 MG tablet   Oral   Take 200 mg by mouth every 6 (six) hours as needed. Pain         . ketorolac (TORADOL) 10 MG tablet   Oral   Take 1 tablet (10 mg total) by mouth every 6 (six) hours as needed for pain.   20 tablet   0   . losartan (COZAAR) 100 MG tablet   Oral   Take 100 mg by mouth daily.          . montelukast (SINGULAIR) 10 MG tablet      take 1 tablet by mouth once daily   30 tablet   3   . pantoprazole (PROTONIX) 40 MG  tablet   Oral   Take 40 mg by mouth daily.           . rosuvastatin (CRESTOR) 10 MG tablet   Oral   Take 10 mg by mouth daily.          [name] tiotropium (SPIRIVA) 18 MCG inhalation capsule   Inhalation   Place 18 mcg into inhaler and inhale daily.           . traMADol (ULTRAM) 50 MG tablet   Oral   Take 1 tablet (50 mg total) by mouth every 6 (six) hours as needed for pain.   20 tablet   0    BP 123/81  Pulse 95  Temp(Src) 98.8 F (37.1 C) (Oral)  Resp 16  SpO2 100% Physical Exam  Nursing note and vitals reviewed. Constitutional: She is oriented to person, place, and time. She appears well-developed and well-nourished. No distress.  HENT:  Head: Normocephalic and atraumatic.  Mouth/Throat: No oropharyngeal exudate.  Oropharynx with minor erythema, cobblestoning and clear PND.  Eyes: Conjunctivae and EOM are normal.  Neck: Normal range of motion. Neck supple.  Cardiovascular: Normal rate, regular rhythm and normal heart sounds.   Pulmonary/Chest: Effort normal. She has no rales.  Rare expiratory wheeze  Musculoskeletal: She exhibits tenderness. She exhibits no edema.  There is tenderness in the posterior scalp that tracks down the splenius capitis muscles in the trapezius muscle across her ridges and in the parathoracic and lumbar musculature. She has full range of motion. She is not limited with ambulation or getting onto her off the table. As all extremity. Range of motion of the arms. Normal abduction and abduction.  Lymphadenopathy:    She has no cervical adenopathy.  Neurological: She is alert and oriented to person, place, and time. No cranial nerve deficit.  Skin: Skin is warm and dry.  Psychiatric: She has a normal mood and affect.    ED Course  Procedures (including critical care time) Labs Reviewed - No data to display No results found. 1. Myalgia   2. Myofasciitis   3. Headache   4. Allergic rhinitis due to allergen     MDM  Tramadol 50 mg Q4 to 6  hours when necessary pain Flexeril 5 mg twice a day when necessary muscle spasms [place] of  water Apply heat to the areas of pain Call [phone] to establish with M.D. in to get an appointment.   Hayden Rasmussen, NP 11/08/12 1626

## 2012-11-08 NOTE — ED Notes (Signed)
Reports back pain : pain from back of head to low back.  Reports back pain for 6 months.  Patient reports today right shoulder and arm pain.

## 2012-11-22 NOTE — ED Provider Notes (Signed)
Medical screening examination/treatment/procedure(s) were performed by resident physician or non-physician practitioner and as supervising physician I was immediately available for consultation/collaboration.   Barkley Bruns MD.   Linna Hoff, MD 11/22/12 [phone]

## 2013-07-28 ENCOUNTER — Other Ambulatory Visit (HOSPITAL_COMMUNITY): Payer: Self-pay | Admitting: Cardiology

## 2013-07-28 DIAGNOSIS — Z1231 Encounter for screening mammogram for malignant neoplasm of breast: Secondary | ICD-10-CM

## 2013-08-22 ENCOUNTER — Ambulatory Visit (HOSPITAL_COMMUNITY)
Admission: RE | Admit: 2013-08-22 | Discharge: 2013-08-22 | Disposition: A | Discharge status: Home / Self Care | Payer: Medicare Other | Source: Ambulatory Visit | Attending: Cardiology | Admitting: Cardiology

## 2013-08-22 DIAGNOSIS — Z1231 Encounter for screening mammogram for malignant neoplasm of breast: Secondary | ICD-10-CM | POA: Insufficient documentation

## 2013-12-12 ENCOUNTER — Telehealth: Payer: Self-pay

## 2013-12-12 DIAGNOSIS — B379 Candidiasis, unspecified: Secondary | ICD-10-CM

## 2013-12-12 MED ORDER — FLUCONAZOLE 150 MG PO TABS: 150.0000 mg | ORAL_TABLET | Freq: Once | ORAL | Status: DC

## 2013-12-12 NOTE — Telephone Encounter (Signed)
[facility] pharmacy faxed over request for Diflucan 150mg  X1 dose refill. Called patient who c/o of white vaginal discharge with vaginal itching and irritation similar to her past yeast infections. Denies odor. Also denies being sexually active for many years. Informed patient that Diflucan X 1 dose will be sent to pharmacy but requested patient make an appointment if symptoms do not clear in 72 hours. Patient verbalized understanding and gratitude. No further questions or concerns.

## 2013-12-21 ENCOUNTER — Inpatient Hospital Stay (HOSPITAL_COMMUNITY): Payer: Medicare Other

## 2013-12-21 ENCOUNTER — Inpatient Hospital Stay (HOSPITAL_COMMUNITY)
Admission: AD | Admit: 2013-12-21 | Discharge: 2013-12-22 | Disposition: A | Discharge status: Home / Self Care | Payer: Medicare Other | Source: Ambulatory Visit | Attending: Emergency Medicine | Admitting: Emergency Medicine

## 2013-12-21 ENCOUNTER — Encounter (HOSPITAL_COMMUNITY): Payer: Self-pay | Admitting: *Deleted

## 2013-12-21 DIAGNOSIS — Z8249 Family history of ischemic heart disease and other diseases of the circulatory system: Secondary | ICD-10-CM | POA: Insufficient documentation

## 2013-12-21 DIAGNOSIS — R0602 Shortness of breath: Secondary | ICD-10-CM | POA: Diagnosis not present

## 2013-12-21 DIAGNOSIS — R112 Nausea with vomiting, unspecified: Secondary | ICD-10-CM | POA: Diagnosis present

## 2013-12-21 DIAGNOSIS — R0789 Other chest pain: Secondary | ICD-10-CM | POA: Insufficient documentation

## 2013-12-21 DIAGNOSIS — Z8261 Family history of arthritis: Secondary | ICD-10-CM | POA: Insufficient documentation

## 2013-12-21 DIAGNOSIS — R197 Diarrhea, unspecified: Secondary | ICD-10-CM | POA: Insufficient documentation

## 2013-12-21 DIAGNOSIS — J45909 Unspecified asthma, uncomplicated: Secondary | ICD-10-CM | POA: Diagnosis not present

## 2013-12-21 DIAGNOSIS — N760 Acute vaginitis: Secondary | ICD-10-CM | POA: Diagnosis not present

## 2013-12-21 DIAGNOSIS — R109 Unspecified abdominal pain: Secondary | ICD-10-CM | POA: Insufficient documentation

## 2013-12-21 DIAGNOSIS — Z818 Family history of other mental and behavioral disorders: Secondary | ICD-10-CM | POA: Insufficient documentation

## 2013-12-21 DIAGNOSIS — R079 Chest pain, unspecified: Secondary | ICD-10-CM

## 2013-12-21 DIAGNOSIS — F17200 Nicotine dependence, unspecified, uncomplicated: Secondary | ICD-10-CM | POA: Insufficient documentation

## 2013-12-21 DIAGNOSIS — N898 Other specified noninflammatory disorders of vagina: Secondary | ICD-10-CM | POA: Diagnosis not present

## 2013-12-21 LAB — URINALYSIS, ROUTINE W REFLEX MICROSCOPIC
BILIRUBIN URINE: NEGATIVE
Glucose, UA: NEGATIVE mg/dL
Ketones, ur: NEGATIVE mg/dL
Leukocytes, UA: NEGATIVE
NITRITE: NEGATIVE
Protein, ur: NEGATIVE mg/dL
SPECIFIC GRAVITY, URINE: 1.01 (ref 1.005–1.030)
UROBILINOGEN UA: 0.2 mg/dL (ref 0.0–1.0)
pH: 6 (ref 5.0–8.0)

## 2013-12-21 LAB — MAGNESIUM: Magnesium: 2.1 mg/dL (ref 1.5–2.5)

## 2013-12-21 LAB — COMPREHENSIVE METABOLIC PANEL
ALT: 13 U/L (ref 0–35)
ANION GAP: 9 (ref 5–15)
AST: 14 U/L (ref 0–37)
Albumin: 3 g/dL — ABNORMAL LOW (ref 3.5–5.2)
Alkaline Phosphatase: 45 U/L (ref 39–117)
BUN: 19 mg/dL (ref 6–23)
CALCIUM: 9.8 mg/dL (ref 8.4–10.5)
CO2: 24 mEq/L (ref 19–32)
CREATININE: 1.57 mg/dL — AB (ref 0.50–1.10)
Chloride: 112 mEq/L (ref 96–112)
GFR calc non Af Amer: 33 mL/min — ABNORMAL LOW (ref >90)
GFR, EST AFRICAN AMERICAN: 39 mL/min — AB (ref >90)
GLUCOSE: 97 mg/dL (ref 70–99)
POTASSIUM: 3.7 meq/L (ref 3.7–5.3)
Sodium: 145 mEq/L (ref 137–147)
TOTAL PROTEIN: 6.9 g/dL (ref 6.0–8.3)
Total Bilirubin: <0.2 mg/dL — ABNORMAL LOW (ref 0.3–1.2)

## 2013-12-21 LAB — PRO B NATRIURETIC PEPTIDE: PRO B NATRI PEPTIDE: 171.9 pg/mL — AB (ref 0–125)

## 2013-12-21 LAB — CBC
HCT: 35.9 % — ABNORMAL LOW (ref 36.0–46.0)
HEMOGLOBIN: 12.5 g/dL (ref 12.0–15.0)
MCH: 32.5 pg (ref 26.0–34.0)
MCHC: 34.8 g/dL (ref 30.0–36.0)
MCV: 93.2 fL (ref 78.0–100.0)
Platelets: 187 10*3/uL (ref 150–400)
RBC: 3.85 MIL/uL — ABNORMAL LOW (ref 3.87–5.11)
RDW: 14.1 % (ref 11.5–15.5)
WBC: 9.3 10*3/uL (ref 4.0–10.5)

## 2013-12-21 LAB — PROTIME-INR
INR: 0.98 (ref 0.00–1.49)
Prothrombin Time: 13 seconds (ref 11.6–15.2)

## 2013-12-21 LAB — TROPONIN I: Troponin I: <0.3 ng/mL (ref <0.30)

## 2013-12-21 LAB — URINE MICROSCOPIC-ADD ON

## 2013-12-21 LAB — APTT: aPTT: 28 seconds (ref 24–37)

## 2013-12-21 MED ORDER — ALBUTEROL SULFATE (2.5 MG/3ML) 0.083% IN NEBU
5.0000 mg | INHALATION_SOLUTION | Freq: Once | RESPIRATORY_TRACT | Status: AC
  Administered 2013-12-21: 5 mg via RESPIRATORY_TRACT
  Filled 2013-12-21: qty 6

## 2013-12-21 MED ORDER — MORPHINE SULFATE 4 MG/ML IJ SOLN
2.0000 mg | Freq: Once | INTRAMUSCULAR | Status: AC
  Administered 2013-12-21: 2 mg via INTRAVENOUS
  Filled 2013-12-21: qty 1

## 2013-12-21 MED ORDER — SODIUM CHLORIDE 0.9 % IV SOLN
INTRAVENOUS | Status: DC
  Administered 2013-12-21: 19:00:00 via INTRAVENOUS

## 2013-12-21 MED ORDER — IPRATROPIUM BROMIDE 0.02 % IN SOLN
0.5000 mg | Freq: Once | RESPIRATORY_TRACT | Status: AC
  Administered 2013-12-21: 0.5 mg via RESPIRATORY_TRACT
  Filled 2013-12-21: qty 2.5

## 2013-12-21 MED ORDER — ASPIRIN 81 MG PO CHEW
CHEWABLE_TABLET | ORAL | Status: AC
  Administered 2013-12-21: 81 mg
  Filled 2013-12-21: qty 4

## 2013-12-21 MED ORDER — ASPIRIN 81 MG PO CHEW
324.0000 mg | CHEWABLE_TABLET | Freq: Once | ORAL | Status: AC
  Administered 2013-12-21: 324 mg via ORAL

## 2013-12-21 NOTE — MAU Provider Note (Signed)
History     CSN: [id number]  Arrival date and time: 12/21/13 1744   First Provider Initiated Contact with Patient 12/21/13 1842      No chief complaint on file.  HPI This is a 68 y.o. female who presents with c/o multiple somatic complaints. These include 2 days of N/V/D, aches, and abdominal cramping. Also c/o vaginal discharge, but has been treated with Diflucan via clinic.  Upon arrival, she reports a 2 day history of Chest tightness and heaviness accompanied by shortness of breath.  States she cannot "catch her breath".  Has a history of asthma "for years" which she states is well-controlled with Advair and Singulair.   RN Note:  Patient states that in the early am hours of 8-2 she started having nausea, vomiting and diarrhea with stomach pain. None in the last 3 days. Just does not feel right. Has a vaginal discharge with itching and was treated through the clinic with medication. Has a headache and eyes feel "squinty".        States she is having chest tightness and pressure. Feels short of breath.         OB History   Grav Para Term Preterm Abortions TAB SAB Ect Mult Living   5 4 4  1 1    4       Past Medical History  Diagnosis Date  . TOBACCO ABUSE   . SPINAL STENOSIS, LUMBAR     MRI 12/2005; surg 2004  . ALLERGIC RHINITIS   . ASTHMA, UNSPECIFIED, UNSPECIFIED STATUS   . HYPERTENSION   . DYSLIPIDEMIA   . Mild renal insufficiency   . PNA (pneumonia)   . [id number](944.9)     Past Surgical History  Procedure Laterality Date  . Decompressive laminectomy      Fusion L4-5, L5-S1 ([facility]-2004)  . Right hand  1995  . Right knee  1990  . Right video-assisted thoracoscopic surgery, drainage of empyema.  02/12/2011  . Decortication of right lower lobe    . Closure of bronchopleural fistula, right lower lobe.    . Placement of wound on-q analgesia irrigation system.      Family History  Problem Relation Age of Onset  . Arthritis Other   . Hypertension Other   .  Mental illness Mother   . Hypertension Mother     History  Substance Use Topics  . Smoking status: Current Every Day Smoker -- 0.25 packs/day for 40 years    Types: Cigarettes  . 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 Alcohol Use: No    Allergies: No Known Allergies  Prescriptions prior to admission  Medication Sig Dispense Refill  . alendronate (FOSAMAX) 70 MG tablet Take 70 mg by mouth every 7 (seven) days. Every tuesdays.Marland KitchenMarland KitchenMarland KitchenTake in the morning with a full glass of water, on an empty stomach, and do not take anything else by mouth or lie down for the next 30 min.      . cyclobenzaprine (FLEXERIL) 5 MG tablet Take 1 tablet (5 mg total) by mouth 2 (two) times daily as needed for muscle spasms. Will cause drowsiness  20 tablet  0  . diltiazem (CARDIZEM CD) 240 MG 24 hr capsule Take 240 mg by mouth daily.      . fluconazole (DIFLUCAN) 150 MG tablet Take 1 tablet (150 mg total) by mouth once.  1 tablet  0  . Fluticasone-Salmeterol (ADVAIR DISKUS) 250-50  MCG/DOSE AEPB Inhale 1 puff into the lungs 2 (two) times daily.        . furosemide (LASIX) 20 MG tablet Take 20 mg by mouth daily.       [name] ibuprofen (ADVIL,MOTRIN) 200 MG tablet Take 200 mg by mouth every 6 (six) hours as needed. Pain      . ketorolac (TORADOL) 10 MG tablet Take 1 tablet (10 mg total) by mouth every 6 (six) hours as needed for pain.  20 tablet  0  . losartan (COZAAR) 100 MG tablet Take 100 mg by mouth daily.       . montelukast (SINGULAIR) 10 MG tablet take 1 tablet by mouth once daily  30 tablet  3  . pantoprazole (PROTONIX) 40 MG tablet Take 40 mg by mouth daily.        . rosuvastatin (CRESTOR) 10 MG tablet Take 10 mg by mouth daily.       [name] tiotropium (SPIRIVA) 18 MCG inhalation capsule Place 18 mcg into inhaler and inhale daily.        . traMADol (ULTRAM) 50 MG tablet Take 1 tablet (50 mg total) by mouth every 6 (six) hours as needed for pain.   20 tablet  0    Review of Systems  Constitutional: Positive for malaise/fatigue. Negative for fever and chills.  Eyes: Positive for blurred vision ("cant hardly see yall"). Negative for double vision.  Respiratory: Positive for shortness of breath. Negative for cough, sputum production and wheezing.   Cardiovascular: Positive for chest pain (mostly described as pressure and heaviness). Negative for palpitations and leg swelling.  Gastrointestinal: Positive for nausea, vomiting, abdominal pain and diarrhea. Negative for constipation.  Musculoskeletal: Negative for myalgias.  Neurological: Positive for weakness and headaches (for 2 days). Negative for dizziness, sensory change, speech change and focal weakness.   Physical Exam   Blood pressure 112/74, pulse 84, temperature 98.5 F (36.9 C), temperature source Oral, resp. rate 22, height 5' 2.5" (1.588 m), weight 62.234 kg (137 lb 3.2 oz), SpO2 100.00%.  Physical Exam  Constitutional: She is oriented to person, place, and time. She appears well-developed and well-nourished. No distress.  HENT:  Head: Normocephalic.  Cardiovascular: Normal rate and regular rhythm.  Exam reveals no gallop and no friction rub.   Murmur (?soft systolic) heard. Respiratory: Effort normal and breath sounds normal. No respiratory distress. She has no wheezes. She has no rales. She exhibits no tenderness.  GI: Soft. She exhibits no distension. There is tenderness (mild, diffuse). There is no rebound and no guarding.  Genitourinary:  Pelvic exam deferred secondary to chest pain and recent treatment   Musculoskeletal: Normal range of motion.  Neurological: She is alert and oriented to person, place, and time.  Skin: Skin is warm and dry.  Psychiatric: She has a normal mood and affect.    MAU Course  Procedures  MDM ICU RN here to assist with implementation of chest pain protocol  EKG -  Normal sinus rhythm Results for orders placed during the hospital  encounter of 12/21/13 (from the past 24 hour(s))  URINALYSIS, ROUTINE W REFLEX MICROSCOPIC     Status: Abnormal   Collection Time    12/21/13  6:25 PM      Result Value Ref Range   Color, Urine YELLOW  YELLOW   APPearance CLEAR  CLEAR   Specific Gravity, Urine 1.010  1.005 - 1.030   pH 6.0  5.0 - 8.0   Glucose, UA NEGATIVE  NEGATIVE mg/dL  Hgb urine dipstick MODERATE (*) NEGATIVE   Bilirubin Urine NEGATIVE  NEGATIVE   Ketones, ur NEGATIVE  NEGATIVE mg/dL   Protein, ur NEGATIVE  NEGATIVE mg/dL   Urobilinogen, UA 0.2  0.0 - 1.0 mg/dL   Nitrite NEGATIVE  NEGATIVE   Leukocytes, UA NEGATIVE  NEGATIVE  URINE MICROSCOPIC-ADD ON     Status: None   Collection Time    12/21/13  6:25 PM      Result Value Ref Range   Squamous Epithelial / LPF RARE  RARE   WBC, UA 0-2  <3 WBC/hpf   RBC / HPF 0-2  <3 RBC/hpf   Bacteria, UA RARE  RARE  CBC     Status: Abnormal   Collection Time    12/21/13  6:48 PM      Result Value Ref Range   WBC 9.3  4.0 - 10.5 K/uL   RBC 3.85 (*) 3.87 - 5.11 MIL/uL   Hemoglobin 12.5  12.0 - 15.0 g/dL   HCT 51.835.9 (*) 84.136.0 - 66.046.0 %   MCV 93.2  78.0 - 100.0 fL   MCH 32.5  26.0 - 34.0 pg   MCHC 34.8  30.0 - 36.0 g/dL   RDW 63.014.1  16.011.5 - 10.915.5 %   Platelets 187  150 - 400 K/uL   EKG: normal sinus rhythm.  Assessment and Plan  A:  Chest pressure and heaviness, patient states improved with Oxygen administration       Shortness of breath, patient states improved with Oxygen administration       Preexisting asthma       Several risk factors in history       Probable gastroenteritis, no active vomiting or diarrhea while here        Vaginitis, previously treated, probably candida vs atrophic/postmenopausal  P:   [facility], Dr Rosalia Hammersay accepting        Chest pain protocol in process  [facility] CenterWILLIAMS,Roosevelt Eimers 12/21/2013, 7:12 PM

## 2013-12-21 NOTE — MAU Note (Signed)
P. Grady Rapid Response RN @ bedside.

## 2013-12-21 NOTE — MAU Note (Signed)
RT here for EKG, O2 @ 2L/min by [place].

## 2013-12-21 NOTE — ED Provider Notes (Signed)
CSN: [id number]     Arrival date & time 12/21/13  1744 History   First MD Initiated Contact with Patient 12/21/13 2152     Chief Complaint  Patient presents with  . Chest Pain  . Abdominal Pain     (Consider location/radiation/quality/duration/timing/severity/associated sxs/prior Treatment) HPI Comments: [name] is a 68 y.o. Female with a PMHx of allergic rhinitis, asthma, HTN, HLD, chronic HA, and mild renal insufficiency presenting today multiple complaints, including abdominal pain, nausea, vomiting, and diarrhea x1 week after eating suspicious food (hamburger, but pt not clear) and having a yeast infection (given diflucan by PCP last week), complaints for which [facility] to being transferred here. While she was there, she reported some chest tightness and shortness of breath, therefore they implemented a chest pain protocol and transferred her here. She states the chest pain was pressure-like, mild, substernal, intermittent nonradiating, occurring at rest, which began yesterday but has also been present intermittently over the last week with no known aggravating factors, and that they gave her oxygen which relieved her shortness of breath and chest pain entirely. Upon arrival, she is endorsing that she no longer has this pain. States this CP has been present previously in the week, after she was vomiting, but had been intermittent over the last week. She states that she didn't take anything [facility]. At baseline, pt states she has asthma and uses inhalers and singulair and has not had any asthma issues in many yrs. Endorses baseline cough intermittently with no sputum production. Denies back pain, HA, vision changes, fevers, chills, diaphoresis, jaw pain, arm pain/numbness, dizziness, paresthesias, weakness, LE swelling, syncope/presyncope, orthopnea, or anxiety. She is stating that [facility] her [facility]d [facility] and stated that she would be  treated here for a UTI and yeast infection. She was given diflucan last week for yeast infection, told to see her dr in 3 days if symptoms persisted. She states she has a hx of yeast infections, and these symptoms are the same (vaginal discharge, vaginal itching). Denies dysuria or hematuria. States she's been having lower abd pain, n/v/d x1 wk after eating suspicious food last week, states she's been passing gas although isn't sure of the last time, and believes she has not had any BMs in 2 days, although she's not entirely sure on the timeline. Pt is poor historian with regards to timing of symptoms, but denies that her abdomen feels distended.  Patient is a 68 y.o. female presenting with chest pain. The history is provided by the patient. No language interpreter was used.  Chest Pain Pain location:  Substernal area Pain quality: pressure   Pain radiates to:  Does not radiate Pain radiates to the back: no   Pain severity:  Mild Onset quality:  Sudden Duration:  1 day Timing:  Intermittent Progression:  Resolved Chronicity:  New Context: at rest   Relieved by:  Oxygen Worsened by:  Nothing tried Ineffective treatments:  None tried Associated symptoms: abdominal pain (ongoing x 1 wk after eating suspicious food, and yeast infection), cough (intermittently over the last week, nonproductive), nausea (ongoing x 1 wk since suspicious food intake, did not occur during CP episode), shortness of breath (now resolved with O2) and vomiting (ongoing x1 wk)   Associated symptoms: no altered mental status, no anxiety, no back pain, no claudication, no diaphoresis, no dizziness, no fatigue, no fever, no headache, no lower extremity edema, no near-syncope, no numbness, no orthopnea, no  palpitations, no PND, no syncope and no weakness   Risk factors: high cholesterol, hypertension and smoking     Past Medical History  Diagnosis Date  . TOBACCO ABUSE   . SPINAL STENOSIS, LUMBAR     MRI 12/2005; surg 2004    . ALLERGIC RHINITIS   . ASTHMA, UNSPECIFIED, UNSPECIFIED STATUS   . HYPERTENSION   . DYSLIPIDEMIA   . Mild renal insufficiency   . PNA (pneumonia)   . ZHYQMVHQ(469.6)    Past Surgical History  Procedure Laterality Date  . Decompressive laminectomy      Fusion L4-5, L5-S1 ([facility]-2004)  . Right hand  1995  . Right knee  1990  . Right video-assisted thoracoscopic surgery, drainage of empyema.  02/12/2011  . Decortication of right lower lobe    . Closure of bronchopleural fistula, right lower lobe.    . Placement of wound on-q analgesia irrigation system.     Family History  Problem Relation Age of Onset  . Arthritis Other   . Hypertension Other   . Mental illness Mother   . Hypertension Mother    History  Substance Use Topics  . Smoking status: Current Every Day Smoker -- 0.25 packs/day for 40 years    Types: Cigarettes  . 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 Alcohol Use: No   OB History   Grav Para Term Preterm Abortions TAB SAB Ect Mult Living   5 4 4  1 1    4      Review of Systems  Constitutional: Negative for fever, diaphoresis and fatigue.  Eyes: Negative for visual disturbance.  Respiratory: Positive for cough (intermittently over the last week, nonproductive), chest tightness and shortness of breath (now resolved with O2).   Cardiovascular: Positive for chest pain. Negative for palpitations, orthopnea, claudication, syncope, PND and near-syncope.  Gastrointestinal: Positive for nausea (ongoing x 1 wk since suspicious food intake, did not occur during CP episode), vomiting (ongoing x1 wk), abdominal pain (ongoing x 1 wk after eating suspicious food, and yeast infection) and diarrhea. Negative for constipation, blood in stool, abdominal distention and rectal pain.  Genitourinary: Positive for vaginal discharge and vaginal pain (itching). Negative for dysuria, urgency, frequency, hematuria,  flank pain, decreased urine volume, vaginal bleeding and difficulty urinating.  Musculoskeletal: Negative for arthralgias, back pain, joint swelling, neck pain and neck stiffness.  Skin: Negative for rash.  Neurological: Negative for dizziness, weakness, light-headedness, numbness and headaches.  10 Systems reviewed and are negative for acute change except as noted in the HPI.     Allergies  Shrimp  Home Medications   Prior to Admission medications   Medication Sig Start Date End Date Taking? Authorizing Provider  alendronate (FOSAMAX) 70 MG tablet Take 70 mg by mouth every 7 (seven) days. Every Monday.Marland KitchenMarland KitchenMarland KitchenTake in the morning with a full glass of water, on an empty stomach, and do not take anything else by mouth or lie down for the next 30 min.   Yes Historical Provider, MD  Cyanocobalamin (VITAMIN B-12 PO) Take 1 tablet by mouth daily.   Yes Historical Provider, MD  diltiazem (CARDIZEM CD) 240 MG 24 hr capsule Take 240 mg by mouth 2 (two) times daily.    Yes Historical Provider, MD  fluconazole (DIFLUCAN) 150 MG tablet Take 1 tablet (150 mg total) by mouth once. 12/12/13  Yes Deirdre C Poe, CNM  Fluticasone-Salmeterol (ADVAIR DISKUS) 250-50 MCG/DOSE AEPB  Inhale 1 puff into the lungs 2 (two) times daily.     Yes Historical Provider, MD  furosemide (LASIX) 20 MG tablet Take 20 mg by mouth daily.    Yes Historical Provider, MD  ibuprofen (ADVIL,MOTRIN) 200 MG tablet Take 200 mg by mouth daily. Pain   Yes Historical Provider, MD  losartan (COZAAR) 100 MG tablet Take 50 mg by mouth daily.    Yes Historical Provider, MD  montelukast (SINGULAIR) 10 MG tablet take 1 tablet by mouth once daily 06/19/11  Yes Newt Lukes, MD  Multiple Vitamins-Minerals (MULTIVITAMIN PO) Take 1 tablet by mouth daily.   Yes Historical Provider, MD  pantoprazole (PROTONIX) 40 MG tablet Take 40 mg by mouth daily.     Yes Historical Provider, MD  rosuvastatin (CRESTOR) 10 MG tablet Take 10 mg by mouth daily.    Yes  Historical Provider, MD  tiotropium (SPIRIVA) 18 MCG inhalation capsule Place 18 mcg into inhaler and inhale daily.     Yes Historical Provider, MD  traMADol (ULTRAM) 50 MG tablet Take 1 tablet (50 mg total) by mouth every 6 (six) hours as needed for pain. 11/08/12  Yes Hayden Rasmussen, NP  fluconazole (DIFLUCAN) 150 MG tablet Take 1 tablet (150 mg total) by mouth once. 12/22/13   Zoeie Ritter Strupp Camprubi-Soms, PA-C   BP 111/71  Pulse 74  Temp(Src) 98.4 F (36.9 C) (Oral)  Resp 16  Ht 5' 2.5" (1.588 m)  Wt 137 lb 3.2 oz (62.234 kg)  BMI 24.68 kg/m2  SpO2 98% Physical Exam  Nursing note and vitals reviewed. Constitutional: She is oriented to person, place, and time. Vital signs are normal. She appears well-developed and well-nourished.  Non-toxic appearance. No distress.  Afebrile, nontoxic, NAD, very upset with care she has been receiving and needing to be redirected several times to obtain full hx  HENT:  Head: Normocephalic and atraumatic.  Mouth/Throat: Mucous membranes are normal.  MMM  Eyes: Conjunctivae and EOM are normal. Pupils are equal, round, and reactive to light. Right eye exhibits no discharge. Left eye exhibits no discharge.  Neck: Normal range of motion. Neck supple.  Cardiovascular: Normal rate, regular rhythm, normal heart sounds and intact distal pulses.  Exam reveals no gallop and no friction rub.   No murmur heard. Pulmonary/Chest: Effort normal. No respiratory distress. She has no decreased breath sounds. She has wheezes in the right lower field and the left lower field. She has no rhonchi. She has no rales.  End expiratory wheeze in lower lobes bilaterally but R>L, improved mildly with nebulizer. No resp distress or decreased breath sounds. No chest wall TTP  Abdominal: Soft. Normal appearance and bowel sounds are normal. She exhibits no distension. There is tenderness in the right lower quadrant, suprapubic area and left lower quadrant. There is no rigidity, no rebound, no  guarding, no CVA tenderness, no tenderness at McBurney's point and negative Murphy's sign.    Mild TTP diffusely throughout lower abd. Soft, nondistended, no r/g/r, neg murphy's, neg mcburney's, neg CVA TTP. +BS throughout, although somewhat quiet in LUQ/LLQ.  Musculoskeletal: Normal range of motion.  Baseline ROM and strength in all extremities  Neurological: She is alert and oriented to person, place, and time. She has normal strength. No sensory deficit.  Skin: Skin is warm, dry and intact. No rash noted.  Psychiatric: Her mood appears anxious. Her affect is angry. She is agitated.  Slightly agitated, anxious, and angry regarding the care she has received. Several times needs to be  reminded to calm down and answer questions, but somewhat circumstantial in speech pattern    ED Course  Procedures (including critical care time) Labs Review Labs Reviewed  URINALYSIS, ROUTINE W REFLEX MICROSCOPIC - Abnormal; Notable for the following:    Hgb urine dipstick MODERATE (*)    All other components within normal limits  CBC - Abnormal; Notable for the following:    RBC 3.85 (*)    HCT 35.9 (*)    All other components within normal limits  COMPREHENSIVE METABOLIC PANEL - Abnormal; Notable for the following:    Creatinine, Ser 1.57 (*)    Albumin 3.0 (*)    Total Bilirubin <0.2 (*)    GFR calc non Af Amer 33 (*)    GFR calc Af Amer 39 (*)    All other components within normal limits  PRO B NATRIURETIC PEPTIDE - Abnormal; Notable for the following:    Pro B Natriuretic peptide (BNP) 171.9 (*)    All other components within normal limits  PROTIME-INR  APTT  MAGNESIUM  TROPONIN I  URINE MICROSCOPIC-ADD ON  TROPONIN I    Imaging Review Ct Abdomen Pelvis Wo Contrast  12/22/2013   CLINICAL DATA:  Abdominal pain.    Rule out obstruction.  EXAM: CT ABDOMEN AND PELVIS WITHOUT CONTRAST  TECHNIQUE: Multidetector CT imaging of the abdomen and pelvis was performed following the standard protocol  without IV contrast.  COMPARISON:  02/03/2011  FINDINGS: BODY WALL: Unremarkable.  LOWER CHEST: Pleural parenchymal scarring in the right lateral costophrenic sulcus.  ABDOMEN/PELVIS:  Liver: Numerous cysts within the liver which are grossly stable from 2012.  Biliary: No evidence of biliary obstruction or stone.  Pancreas: Chronic enlargement of the main pancreatic duct, measuring up to 5 mm. The multi year stability is compatible with a benign process; no visible mass or inflammation.  Spleen: Unremarkable.  Adrenals: Unremarkable.  Kidneys and ureters: Multiple bilateral renal cysts based on previous imaging. The largest and most discrete today is in the interpolar right kidney at 23 mm. No hydronephrosis or nephrolithiasis.  Bladder: Unremarkable.  Reproductive: Unremarkable.  Bowel: No obstruction. Relative, mild circumferential thickening of the splenic flexure colon wall, without surrounding fat infiltration to suggest acute inflammation. Negative appendix.  Retroperitoneum: No mass or adenopathy.  Peritoneum: No ascites or pneumoperitoneum.  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 2.5 cm fusiform distal aortic aneurysm. These measurements are taken in the sagittal plane.  OSSEOUS: L4-5 and L5-S1 posterior lumbar interbody fusion with rod and pedicle screw fixation. No acute/adverse features. There is advanced adjacent level (L3-4) degenerative disc and facet disease with anterolisthesis. Changes are similar to previous. No acute fracture.  IMPRESSION: 1. Negative for bowel obstruction or other acute intra-abdominal abnormality. 2. 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 3. Chronic findings which are stable from 2012 are noted above.   Electronically Signed   By: Tiburcio Pea M.D.   On: 12/22/2013 02:55   Dg Chest 2 View  12/21/2013   CLINICAL DATA:  Chest pain.  EXAM: CHEST  2 VIEW  COMPARISON:  07/22/2011  FINDINGS: Normal heart size. Chronic aortic  tortuosity. Pulmonary hyperinflation and diffuse interstitial coarsening. There is chronic blunting of the lateral right costophrenic sulcus consistent with scarring. Linear scar along an upper major fissure in the lateral projection.  IMPRESSION: 1. No active cardiopulmonary disease. 2. COPD and right pleural scarring.   Electronically Signed   By: Audry Riles.D.  On: 12/21/2013 23:01     EKG Interpretation None     EKG: NSR  MDM   Final diagnoses:  Chest pain at rest  Shortness of breath  Asthma, unspecified asthma severity, uncomplicated   68y/o female with multiple complaints including: CP/SOB which is now resolved, abd pain/n/v/d x1 wk, and yeast infection. [facility], assessed for her abd pain/yeast infection, and transferred here for w/up of chest pain. Resolved prior to exam, was given ASA, morphine 2mg , and [facility]fran [facility]. Labs obtained at [facility] as follows: EKG WNL U/A with moderate hgb but no signs of infection, likely related to kidney issues which are chronic Mg WNL INR/aPTT WNL CMP showing baseline Cr and mildly lower than baseline GFR, pt getting gentle hydration, doubt AKI, I believe this is chronic kidney issues with slight dehydration now CBC WNL Trop I #1 WNL, will obtain second troponin at 2215 BNP mildly elevated at 171.9, doubt CHF and pt not fluid overloaded, do not feel this is clinically significant at this time Will obtain CXR due to mild wheezing, and give nebs. Pt continuing to deny any CP/SOB at this time and oxygenating well on 2L via [place] (which is for pt comfort, since she oxygenates will without supplemental oxygen) Given that pt was already evaluated for her vaginal complaints, do not feel pelvic is necessary at this time. Discussed with pt that we will give her one more dose of diflucan and have her f/up with PCP, which was documented in the chart as what her PCP advised her to do last week. I believe pt's CP is related to vomiting, and  esophageal irritation, but will not give GI cocktail at this time given that pt is having abd pain and do not want to confound the clinical picture at this time. Discussed with pt use of pepto bismol at home.  11:54 PM Troponin #2 negative. Lung sounds improved after nebs. CXR showing chronic changes of lungs, but when CXR reviewed, appears to have large amounts of gas in bowels in splenic flexure. Will obtain CT at this time with PO contrast only, r/o obstruction or volvulus. Pt initially stated she was passing gas but now states she doesn't think she's passed gas in 3 days. Pt poor historian. VSS at the moment. Will re-assess shortly.   1:44 AM Called CT, pt will be undergoing 2hr contrast protocol. Signed out to [employer] who will take over care from here. Please see her dictation for further documentation of pt care.  BP 111/71  Pulse 74  Temp(Src) 98.4 F (36.9 C) (Oral)  Resp 16  Ht 5' 2.5" (1.588 m)  Wt 137 lb 3.2 oz (62.234 kg)  BMI 24.68 kg/m2  SpO2 98%  Meds ordered this encounter  Medications  . 0.9 %  sodium chloride infusion    Sig:   . aspirin chewable tablet 324 mg    Sig:   . aspirin 81 MG chewable tablet    Sig:     [place]   : cabinet override  . morphine 4 MG/ML injection 2 mg    Sig:   . albuterol (PROVENTIL) (2.5 MG/3ML) 0.083% nebulizer solution 5 mg    Sig:   . ipratropium (ATROVENT) nebulizer solution 0.5 mg    Sig:   . iohexol (OMNIPAQUE) 300 MG/ML solution 25 mL    Sig:   . fluconazole (DIFLUCAN) 150 MG tablet    Sig: Take 1 tablet (150 mg total) by mouth once.    Dispense:  1 tablet    Refill:  0    Order Specific Question:  Supervising Provider    Answer:  Vida Roller [id number] Camprubi-Soms, PA-C 12/22/13 [phone]

## 2013-12-21 NOTE — MAU Note (Signed)
States she is having chest tightness and pressure. Feels short of breath.

## 2013-12-21 NOTE — ED Notes (Signed)
[facility] for abdominal pain x 1 week after getting food poisoning, yeast infection, chest pain since yesterday that was resolved with 2mg  of morphine, and sob that was resolved with O2. Pt also reported neck pain x 4 days from sleeping on couch, was given ice pack at [facility] and states pain is relieved. Pt with no complaints at this time.

## 2013-12-21 NOTE — MAU Note (Signed)
Patient states that in the early am hours of 8-2 she started having nausea, vomiting and diarrhea with stomach pain. None in the last 3 days. Just does not feel right. Has a vaginal discharge with itching and was treated through the clinic with medication. Has a headache and eyes feel "squinty".

## 2013-12-22 ENCOUNTER — Encounter (HOSPITAL_COMMUNITY): Payer: Self-pay | Admitting: Emergency Medicine

## 2013-12-22 ENCOUNTER — Emergency Department (HOSPITAL_COMMUNITY)
Admission: EM | Admit: 2013-12-22 | Discharge: 2013-12-22 | Disposition: A | Discharge status: Home / Self Care | Payer: Medicare Other | Attending: Emergency Medicine | Admitting: Emergency Medicine

## 2013-12-22 ENCOUNTER — Inpatient Hospital Stay (HOSPITAL_COMMUNITY): Payer: Medicare Other

## 2013-12-22 DIAGNOSIS — I10 Essential (primary) hypertension: Secondary | ICD-10-CM | POA: Diagnosis not present

## 2013-12-22 DIAGNOSIS — B9689 Other specified bacterial agents as the cause of diseases classified elsewhere: Secondary | ICD-10-CM

## 2013-12-22 DIAGNOSIS — R109 Unspecified abdominal pain: Secondary | ICD-10-CM | POA: Diagnosis not present

## 2013-12-22 DIAGNOSIS — Z79899 Other long term (current) drug therapy: Secondary | ICD-10-CM | POA: Diagnosis not present

## 2013-12-22 DIAGNOSIS — J45909 Unspecified asthma, uncomplicated: Secondary | ICD-10-CM | POA: Diagnosis not present

## 2013-12-22 DIAGNOSIS — Z8739 Personal history of other diseases of the musculoskeletal system and connective tissue: Secondary | ICD-10-CM | POA: Insufficient documentation

## 2013-12-22 DIAGNOSIS — E785 Hyperlipidemia, unspecified: Secondary | ICD-10-CM | POA: Diagnosis not present

## 2013-12-22 DIAGNOSIS — Z8701 Personal history of pneumonia (recurrent): Secondary | ICD-10-CM | POA: Insufficient documentation

## 2013-12-22 DIAGNOSIS — N760 Acute vaginitis: Secondary | ICD-10-CM | POA: Insufficient documentation

## 2013-12-22 DIAGNOSIS — L293 Anogenital pruritus, unspecified: Secondary | ICD-10-CM | POA: Insufficient documentation

## 2013-12-22 DIAGNOSIS — R0789 Other chest pain: Secondary | ICD-10-CM | POA: Diagnosis not present

## 2013-12-22 DIAGNOSIS — Z87448 Personal history of other diseases of urinary system: Secondary | ICD-10-CM | POA: Insufficient documentation

## 2013-12-22 DIAGNOSIS — F17200 Nicotine dependence, unspecified, uncomplicated: Secondary | ICD-10-CM | POA: Insufficient documentation

## 2013-12-22 LAB — WET PREP, GENITAL
Trich, Wet Prep: NONE SEEN
WBC, Wet Prep HPF POC: NONE SEEN
Yeast Wet Prep HPF POC: NONE SEEN

## 2013-12-22 LAB — GC/CHLAMYDIA PROBE AMP
CT Probe RNA: NEGATIVE
GC Probe RNA: NEGATIVE

## 2013-12-22 MED ORDER — METRONIDAZOLE 500 MG PO TABS: 500.0000 mg | ORAL_TABLET | Freq: Two times a day (BID) | ORAL | Status: DC

## 2013-12-22 MED ORDER — TRAMADOL HCL 50 MG PO TABS: 50.0000 mg | ORAL_TABLET | Freq: Four times a day (QID) | ORAL | Status: DC | PRN

## 2013-12-22 MED ORDER — ONDANSETRON HCL 4 MG PO TABS: 4.0000 mg | ORAL_TABLET | Freq: Four times a day (QID) | ORAL | Status: DC

## 2013-12-22 MED ORDER — FLUCONAZOLE 150 MG PO TABS: 150.0000 mg | ORAL_TABLET | Freq: Once | ORAL | Status: DC

## 2013-12-22 MED ORDER — HYDROCORTISONE 1 % EX CREA: 1.0000 "application " | TOPICAL_CREAM | Freq: Two times a day (BID) | CUTANEOUS | Status: DC

## 2013-12-22 MED ORDER — IOHEXOL 300 MG/ML  SOLN
25.0000 mL | INTRAMUSCULAR | Status: AC
  Administered 2013-12-22: 25 mL via ORAL

## 2013-12-22 NOTE — ED Notes (Signed)
Pt given Ginger Ale and crackers, pt tolerating with no issues.

## 2013-12-22 NOTE — ED Notes (Signed)
Pt drink fluids with no problems, no nausea or discomfort.

## 2013-12-22 NOTE — Progress Notes (Signed)
  CARE MANAGEMENT ED NOTE 12/22/2013  Patient:  [name],[name]   Account Number:  [id number]  Date Initiated:  12/22/2013  Documentation initiated by:  [name],[name]  Subjective/Objective Assessment:   68 yr [name] [name]/13 1744 for yeast infection. Found with abdominal pain x 1 week after getting food poisoning, yeast infection, chest pain     Subjective/Objective Assessment Detail:   [name] 2044 . [name] and d/c 0628 12/22/13    [name], [facility] ED unit secretary reports pt and pharmacy expressed displeasure in pt being given Davis rx for diflucan and recommendation of vaginal cream for yeast infection for dx  of yeast  infection Pt wanting another treatment  When ED CM called pt she spoke non stop providing CM "the whole story" ([name] Cm unable to stop pt to interject recommendations Pt repeated "the whole story" x 3 in 15 minutes States [name] she was not given nor offered treatment for yeast infection.  Cm able to stop her after 20 minutes to state that the yeast infection treatment may not have been effective and may need to be repeated as recommended by ED MD/PA/NP per EPIC notes CM able to review while listening to "the whole story" and voiced care complaints Pt asked if the notes said she had Davis "UTI" Cm informed her [facility] indicated she was informed she did not have Davis "UTI"  CM inquired of the pt what she would prefer to do to resolve the issue and pt stated she "do not want the same pill I was given before" and "I can't put the cream in I am too swollen" Cm inquired if pt had tried to insert recommended vaginal cream and informed "No, I can't do it" Pt asked someone present with her "you hear that?" when Cm encouraged her to try the vaginal cream recommended  Pt responded she was returning to "[facility]" to get "what I did  not get while I was there"  Pt confirms no pcp only CV, Dr Sharyn LullHarwani CM [facility]     Action/Plan:   ED CM received Davis call [name] after receiving Davis call from pt &  her pharmacy about med prescribed for her for Davis yeast infection CM informed [name] that CM would call pt CM called pt at [phone] CM listened to pt ventilate   Action/Plan Detail:   Attempted offer Davis resolution for pt concern but unable to identify exactly what the pt preferred as Davis resolution Encouraged pt to return prn for tx  CM spoke with [name] charge nurses to notify that pt would be returning to "[facility]"   Anticipated DC Date:  12/22/2013     Status Recommendation to Physician:   Result of Recommendation:    Other ED Services  Consult Working Plan    DC Planning Services  Other  Outpatient Services - Pt will follow up  PCP issues    Choice offered to / List presented to:            Status of service:  Completed, signed off  ED Comments:   ED Comments Detail:
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
## 2013-12-22 NOTE — Discharge Instructions (Signed)
Call for a follow up appointment with a Family or Primary Care Provider.  Return if symptoms worsen.   Take medication as prescribed.    Emergency Department Resource Guide 1) Find a Doctor and Pay Out of Pocket Although you won't have to find out who is covered by your insurance plan, it is a good idea to ask around and get recommendations. You will then need to call the office and see if the doctor you have chosen will accept you as a new patient and what types of options they offer for patients who are self-pay. Some doctors offer discounts or will set up payment plans for their patients who do not have insurance, but you will need to ask so you aren't surprised when you get to your appointment.  2) Contact Your Local Health Department Not all health departments have doctors that can see patients for sick visits, but many do, so it is worth a call to see if yours does. If you don't know where your local health department is, you can check in your phone book. The CDC also has a tool to help you locate your state's health department, and many state websites also have listings of all of their local health departments.  3) Find a Walk-in Clinic If your illness is not likely to be very severe or complicated, you may want to try a walk in clinic. These are popping up all over the country in pharmacies, drugstores, and shopping centers. They're usually staffed by nurse practitioners or physician assistants that have been trained to treat common illnesses and complaints. They're usually fairly quick and inexpensive. However, if you have serious medical issues or chronic medical problems, these are probably not your best option.  No Primary Care Doctor: - Call Health Connect at  [phone] - they can help you locate a primary care doctor that  accepts your insurance, provides certain services, etc. - Physician Referral Service- [phone]  Chronic Pain Problems: Organization         Address  Phone    Notes  [facility]  [phone] Patients need to be referred by their primary care doctor.   Medication Assistance: Organization         Address  Phone   Notes  [facility] [address] [place], [place] [address] [phone] --Must be a resident of [place] -- Must have NO insurance coverage whatsoever (no Medicaid/ Medicare, etc.) -- The pt. MUST have a primary care doctor that directs their care regularly and follows them in the community   [facility]  [phone]   [employer]  [phone]    Agencies that provide inexpensive medical care: Organization         Address  Phone   Notes  [facility]  [phone]   [facility]    [phone]   [facility] [address] [place], [place] [address] [phone]   [facility] [address], [place] [phone]   [facility]    [phone]) [phone]   [facility]    [phone]   [facility]  [address], [place] Phone:  [phone]) [phone], Fax:  [phone] Hours of Operation:  9 am - 6 pm, M-F.  Also accepts Medicaid/Medicare and self-pay.  [facility]  [address], [place]  Phone: [phone], Fax: [phone]. Hours of Operation:  8:30 am - 5:30 pm, M-F.  Also accepts Medicaid and self-pay.  °[facility] [place] [address], [place] Phone: [phone]   °[facility] [address], [place], [place] ([phone], Ext. 123 Mondays & Thursdays: 7-9 AM.  First 15 patients are seen on a first come, first serve basis. °  ° °Medicaid-accepting [place] Providers: ° °Organization         Address  Phone   Notes  °[facility] [address], [place] [phone]) [phone] Also accepts self-pay patients.  °[facility]  [address], [place] ° ([phone]) [phone]   °[facility] [address], [place] ([phone]) [phone]   °[facility] [address] [place] [address], [place] ([phone]) [phone]   °Veita Bland [address], [place]  ° ([phone]) [phone] Only accepts [place] Access Medicaid patients after they have their name applied to their card.  ° °Self-Pay (no insurance) in [place]: ° °Organization         Address  Phone   Notes  °Sickle Cell Patients, [facility] [address], [place] ([phone]) [phone]   °[facility] [address], [place] ([phone]) [phone]   °[facility] ° [address], [place] ([phone]) [phone]   °[facility]/Dr. Osei-Bonsu ° [address] [place] [address], [place] or [address], [place] [phone]) [phone] Phone number for both [place] and [place] locations is the same.  °[facility] [address], [place] ([phone]) [phone]   °[facility] [address] [place] [address], [place] or [address] (336) [phone] °(336) [phone]   °[facility] [address], [place] (336) [phone], phone; [phone], fax Sees patients 1st and 3rd Saturday of every month.  Must not qualify for public or private insurance (i.e. Medicaid, Medicare, [place] Health Choice, Veterans' Benefits) • Household income should be no more than 200% of the poverty level •The clinic cannot treat you if you are pregnant or think you are pregnant • Sexually transmitted diseases are not treated at the clinic.  ° ° °Dental Care: °Organization         Address  Phone  Notes  °[facility] [address], [place] ([phone]) [phone] Accepts children up to age 21 who are enrolled in Medicaid or [place] Health Choice; pregnant women with a Medicaid card; and children who have  applied for Medicaid or [place] Health Choice, but were declined, whose parents can pay a reduced fee at time of service.  °[facility] [place]  [address], [place] (336) [phone] Accepts children up to age 21 who are enrolled in Medicaid or [place] Health Choice; pregnant women with a Medicaid card; and children who have applied for Medicaid or [place] Health Choice, but were declined, whose parents can pay a reduced fee at time of service.  °[facility] ° [address],  ([phone]) [phone] Patients are seen by appointment only. Walk-ins are not accepted. [facility] will see patients 18 years of age and older. °Monday - Tuesday (8am-5pm) °Most Wednesdays (8:30-5pm) °$30 per visit, cash only  °[facility] ° [address], [place] ([phone]) [phone] Patients are seen by appointment only. Walk-ins are not accepted. [facility] will see patients 18 years of age and older. °One Wednesday Evening (Monthly: Volunteer Based).  $30 per visit, cash only  °[facility]  ([phone]) [phone] for adults; Children under age 4, call Graduate Pediatric Dentistry at [phone]. Children aged 4-14, please call [phone] to request a pediatric application. ° Dental services are provided in all areas of dental care including fillings, crowns and bridges, complete and partial dentures, implants, gum treatment, root canals, and extractions. Preventive care is also provided. Treatment is provided to both adults and children. °Patients are selected via a lottery and there is often a waiting list. °  °[facility] [address], °[place] ° (336) 763-8833 [email or web address] °  °[facility] [address], [place], [place] (336)[phone], Ext. 123 Second and Fourth Thursday of each month, opens at 6:30 AM; Clinic ends at 9 AM.  Patients are seen on a first-come first-served basis, and a  limited number are seen during each clinic.  ° °[facility] ° [address], [place], [place] ([phone]) [phone]   Eligibility Requirements °You must have lived in [place], or [place] counties for at least the last three months. °  You cannot be eligible for state or federal sponsored healthcare insurance, including Veterans Administration, Medicaid, or Medicare. °  You generally cannot be eligible for healthcare insurance through your employer.  °  How to apply: °Eligibility screenings are held every Tuesday and Wednesday afternoon from 1:00 pm until 4:00 pm. You do not need an appointment for the interview!  °[facility] [address], [place], [place] [phone]   °[facility]  336-342-8273   °[facility]  336-703-3100   °[facility]  336-570-6415   ° °Behavioral Health Resources in the Community: °Intensive Outpatient Programs °Organization         Address  Phone  Notes  °[facility] [address], [place], [place] 336-878-6098   °[facility] [address], [place], [place] 336-832-9800   °[facility] [address], [place], [place] ° 336-882-2125   °[facility] [address],  °[place], [place] 1-800-853-5163 or 336-641-4981   °Substance Abuse Resources °Organization         Address  Phone  Notes  °[facility]  336-882-2125   °[facility]  336-784-9470   °[facility]  336-285-9073   °[facility]  336-845-3988   °Residential & Outpatient Substance Abuse Program  1-800-659-3381   °Psychological Services °Organization         Address  Phone  Notes  °[facility]  336- 832-9600   °[facility]  336- 378-7881   °[facility] [address], [place] 1-800-853-5163 or 336-641-4981   ° °Mobile Crisis Teams °Organization          Address  Phone  Notes  °[facility], Mobile Crisis Care Unit  1-877-626-1772   °Assertive °[facility] ° [address] [place], [place] 336-834-9664   °Sharon DeEsch [address] °[place] 336-554-5454   ° °Self-Help/Support Groups °Organization         Address  Phone             Notes  °[facility] [place] - variety of   support groups  336- 373-1402 Call for more information  °Narcotics Anonymous (NA), [facility] [address], °[place]  2 meetings at this location  ° °Residential Treatment Programs °Organization         Address  Phone  Notes  °[facility] [address],    °[place]  1-866-801-8205   °[facility] ° [address], [place], [place] 704-293-8524   °[facility] [address], [place] 336-845-3988 Admissions: 8am-3pm M-F  °[facility] [address]    °[place], [place] 336-841-1104   °[facility] [address], [place], [place] 336-379-7146   °[facility] [address]  °[place], [place] 336-285-9073   °[facility] - Intensive Outpatient [address], [place], [place] 336-852-3033   °[facility] [address]  °[place], [place] 1-877-615-2722 or 336-784-9470   °[facility] ([facility]) [address] [place], [place] 336-227-7417 Accepts Medicaid  °[facility] [address]  °[place] 1-800-659-3381 Substance Abuse/Addiction Treatment  ° °[place] Behavioral Health Resources °Organization         Address  Phone  Notes  °[facility]  (888) 581-9988   °Julie Brannon, PhD [address] [place], [place]   (336) 349-5553 or (336) 951-0000   °[facility]   [address] °[place], [place] (336) 349-4454   °[facility] [address], [place], [place] (336) 342-8316 Insurance/Medicaid/sponsorship  through [facility]  °[facility] [address]                                    [place], [place] (336) 342-8316 Therapy/tele-psych/case  °[facility] [address]  ° [place], [place] (336) 349-2233    °Dr. Arfeen  (336) 349-4544   °[facility] [place]  [employer] [place] [facility] 1) [address], [place] °2) [address], [place] °3)  [address], [place] (336) 349-3220 °(336) 342-7768 ° °(336) 342-8140   °[place] Child Abuse [facility] (336) 342-1394 or (336) 342-3537 (After Hours)    ° °

## 2013-12-22 NOTE — ED Provider Notes (Signed)
Discussed case with Mackenzie Camprubi-Soms, PA-C. Transfer of care from Camprubi-Soms, PA-C at change in shift.   Plan: CT abdomen and pelvis without contrast to be performed if negative patient can be discharged home.   Results for orders placed during the hospital encounter of 12/21/13  URINALYSIS, ROUTINE W REFLEX MICROSCOPIC      Result Value Ref Range   Color, Urine YELLOW  YELLOW   APPearance CLEAR  CLEAR   Specific Gravity, Urine 1.010  1.005 - 1.030   pH 6.0  5.0 - 8.0   Glucose, UA NEGATIVE  NEGATIVE mg/dL   Hgb urine dipstick MODERATE (*) NEGATIVE   Bilirubin Urine NEGATIVE  NEGATIVE   Ketones, ur NEGATIVE  NEGATIVE mg/dL   Protein, ur NEGATIVE  NEGATIVE mg/dL   Urobilinogen, UA 0.2  0.0 - 1.0 mg/dL   Nitrite NEGATIVE  NEGATIVE   Leukocytes, UA NEGATIVE  NEGATIVE  CBC      Result Value Ref Range   WBC 9.3  4.0 - 10.5 K/uL   RBC 3.85 (*) 3.87 - 5.11 MIL/uL   Hemoglobin 12.5  12.0 - 15.0 g/dL   HCT 16.1 (*) 09.6 - 04.5 %   MCV 93.2  78.0 - 100.0 fL   MCH 32.5  26.0 - 34.0 pg   MCHC 34.8  30.0 - 36.0 g/dL   RDW 40.9  81.1 - 91.4 %   Platelets 187  150 - 400 K/uL  COMPREHENSIVE METABOLIC PANEL      Result Value Ref Range   Sodium 145  137 - 147 mEq/L   Potassium 3.7  3.7 - 5.3 mEq/L   Chloride 112  96 - 112 mEq/L   CO2 24  19 - 32 mEq/L   Glucose, Bld 97  70 - 99 mg/dL   BUN 19  6 - 23 mg/dL   Creatinine, Ser 7.82 (*) 0.50 - 1.10 mg/dL   Calcium 9.8  8.4 - 95.6 mg/dL   Total Protein 6.9  6.0 - 8.3 g/dL   Albumin 3.0 (*) 3.5 - 5.2 g/dL   AST 14  0 - 37 U/L   ALT 13  0 - 35 U/L   Alkaline Phosphatase 45  39 - 117 U/L   Total Bilirubin <0.2 (*) 0.3 - 1.2 mg/dL   GFR calc non Af Amer 33 (*) >90 mL/min   GFR calc Af Amer 39 (*) >90 mL/min   Anion gap 9  5 - 15  PRO B NATRIURETIC PEPTIDE      Result Value Ref Range   Pro B Natriuretic peptide (BNP) 171.9 (*) 0 - 125 pg/mL  PROTIME-INR      Result Value Ref Range   Prothrombin Time 13.0  11.6 - 15.2 seconds   INR  0.98  0.00 - 1.49  APTT      Result Value Ref Range   aPTT 28  24 - 37 seconds  MAGNESIUM      Result Value Ref Range   Magnesium 2.1  1.5 - 2.5 mg/dL  TROPONIN I      Result Value Ref Range   Troponin I <0.30  <0.30 ng/mL  URINE MICROSCOPIC-ADD ON      Result Value Ref Range   Squamous Epithelial / LPF RARE  RARE   WBC, UA 0-2  <3 WBC/hpf   RBC / HPF 0-2  <3 RBC/hpf   Bacteria, UA RARE  RARE  TROPONIN I      Result Value Ref Range   Troponin I <  0.30  <0.30 ng/mL   Ct Abdomen Pelvis Wo Contrast  12/22/2013   CLINICAL DATA:  Abdominal pain.    Rule out obstruction.  EXAM: CT ABDOMEN AND PELVIS WITHOUT CONTRAST  TECHNIQUE: Multidetector CT imaging of the abdomen and pelvis was performed following the standard protocol without IV contrast.  COMPARISON:  02/03/2011  FINDINGS: BODY WALL: Unremarkable.  LOWER CHEST: Pleural parenchymal scarring in the right lateral costophrenic sulcus.  ABDOMEN/PELVIS:  Liver: Numerous cysts within the liver which are grossly stable from 2012.  Biliary: No evidence of biliary obstruction or stone.  Pancreas: Chronic enlargement of the main pancreatic duct, measuring up to 5 mm. The multi year stability is compatible with a benign process; no visible mass or inflammation.  Spleen: Unremarkable.  Adrenals: Unremarkable.  Kidneys and ureters: Multiple bilateral renal cysts based on previous imaging. The largest and most discrete today is in the interpolar right kidney at 23 mm. No hydronephrosis or nephrolithiasis.  Bladder: Unremarkable.  Reproductive: Unremarkable.  Bowel: No obstruction. Relative, mild circumferential thickening of the splenic flexure colon wall, without surrounding fat infiltration to suggest acute inflammation. Negative appendix.  Retroperitoneum: No mass or adenopathy.  Peritoneum: No ascites or pneumoperitoneum.  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 2.5 cm fusiform distal aortic aneurysm. These measurements are taken in the sagittal  plane.  OSSEOUS: L4-5 and L5-S1 posterior lumbar interbody fusion with rod and pedicle screw fixation. No acute/adverse features. There is advanced adjacent level (L3-4) degenerative disc and facet disease with anterolisthesis. Changes are similar to previous. No acute fracture.  IMPRESSION: 1. Negative for bowel obstruction or other acute intra-abdominal abnormality. 2. 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 3. Chronic findings which are stable from 2012 are noted above.   Electronically Signed   By: Tiburcio PeaJonathan  Watts M.D.   On: 12/22/2013 02:55   Dg Chest 2 View  12/21/2013   CLINICAL DATA:  Chest pain.  EXAM: CHEST  2 VIEW  COMPARISON:  07/22/2011  FINDINGS: Normal heart size. Chronic aortic tortuosity. Pulmonary hyperinflation and diffuse interstitial coarsening. There is chronic blunting of the lateral right costophrenic sulcus consistent with scarring. Linear scar along an upper major fissure in the lateral projection.  IMPRESSION: 1. No active cardiopulmonary disease. 2. COPD and right pleural scarring.   Electronically Signed   By: Tiburcio PeaJonathan  Watts M.D.   On: 12/21/2013 23:01   Medications  0.9 %  sodium chloride infusion ( Intravenous New Bag/Given 12/21/13 1907)  iohexol (OMNIPAQUE) 300 MG/ML solution 25 mL (25 mLs Oral Contrast Given 12/22/13 0024)  aspirin chewable tablet 324 mg (324 mg Oral Given by Other 12/21/13 2042)  aspirin 81 MG chewable tablet (81 mg  Given 12/21/13 1903)  morphine 4 MG/ML injection 2 mg (2 mg Intravenous Given 12/21/13 1943)  albuterol (PROVENTIL) (2.5 MG/3ML) 0.083% nebulizer solution 5 mg (5 mg Nebulization Given 12/21/13 2318)  ipratropium (ATROVENT) nebulizer solution 0.5 mg (0.5 mg Nebulization Given 12/21/13 2318)   Filed Vitals:   12/21/13 2230 12/22/13 0311 12/22/13 0330 12/22/13 0400  BP: 104/61 114/78 112/66 111/71  Pulse: 74 86 87 74  Temp:  98.4 F (36.9 C)    TempSrc:  Oral    Resp: 14 16    Height:      Weight:      SpO2:  100% 100% 99% 98%   [name] is a 68 year old female with past medical history of renal insufficiency, asthma, spinal stenosis and tobacco abuse  presenting to the ED-transfer from [facility] secondary to chest tightness and shortness of breath that started today. Upon arrival to the ED patient's chest pain shortness of breath were relieved after 2 mg of morphine and oxygen via nasal cannula. Patient reports she's been endorsing abdominal pain for the past week associated with nausea, vomiting, diarrhea. Patient reported that during a church function she had a [employer] hamburger-stated that she was unable to finish the hamburger secondary to abdominal pain beginning and being persistent since this time. Reported intermittent episodes of nausea and vomiting. Denied fever, chills, chest pain, shortness of breath, difficulty breathing, numbness, tingling, abdominal pain, nausea, vomiting, diarrhea, melena, hematochezia while in ED setting. Patient reports she's been endorsing vaginal discharge and vaginal itching-while at women[facility] patient did not have pelvic exam performed secondary to transportation to the main ED secondary to chest pain. Alert oriented. GCS 15. Heart rate and rhythm normal. Lungs good auscultation. Patient appears well and comfortable. Bowel sounds normoactive in all 4 quadrants with negative pain upon palpation to the abdomen-soft upon palpation. Negative rigidity or peritoneal signs. Full range of motion to upper lower extremities bilaterally without difficulty noted. Pelvic exam performed. Negative swelling, erythema, formation, lesions, sores, deformities identified external genitalia. Negative swelling, erythema, inflammation, lesions, sores, deformities identified to the vaginal canal. Thick white discharge identified-negative odor. Negative bright red blood in vaginal vault. Cervix unremarkable. Negative CMT tenderness. Negative bilateral adnexal tenderness.  EKG unremarkable first and  second troponin negative elevation. Negative elevated BNP. CBC unremarkable. CMP noted mildly elevated creatinine of 1.57 - patient's creatinine has been as high as 2.01 approximately 2 years ago. INR unremarkable. Magnesium negative findings. Urinalysis negative for infection-negative nitrites or leukocytes identified. CT abdomen and pelvis with contrast negative for bowel structure or other acute intra-abdominal processes. Aortic and right common iliac artery aneurysms of 2.5 and 2.3 cm respectively - negative findings of dissection. Chest x-ray unremarkable - COPD and right pleural scarring. Wet prep noted few clue cells with negative yeast cells noted. GC/Chlamydia probe pending.   Discussed labs and imaging in great detail with patient. Patient tolerated fluids by mouth without difficulty-negative episodes of emesis while in ED setting. Patient stable, afebrile. Patient is not septic appearing. Patient ambulated with pulse ox of 99% on room air with negative distress when walking. Doubt CHF. Doubt overload. Doubt pneumonia. Doubt TOA. Doubt infection. Doubt UTI/pyelonephritis. Negative acute abnormalities noted to CT scan of abdomen. Discharged patient. Referred patient to PCP. Discussed with patient that aneurysms on CT need to be monitored with PCP. Discussed with patient to closely monitor symptoms and if symptoms are to worsen or change to report back to the ED - strict return instructions given.  Patient agreed to plan of care, understood, all questions answered.   Raymon Mutton, PA-C 12/22/13 1939

## 2013-12-22 NOTE — ED Provider Notes (Signed)
CSN: [id number]     Arrival date & time 12/22/13  1239 History   First MD Initiated Contact with Patient 12/22/13 1252     Chief Complaint  Patient presents with  . Vaginitis     (Consider location/radiation/quality/duration/timing/severity/associated sxs/prior Treatment) HPI Comments: The patient is a 68 year old female presents emergency room chief complaint of vaginal for approximately 5 days.  The patient reports pruritus at the site denies lesions, abnormal vaginal discharge, dysuria.  Last intercourse approximately 30 years.  The history is provided by the patient and medical records. No language interpreter was used.    Past Medical History  Diagnosis Date  . TOBACCO ABUSE   . SPINAL STENOSIS, LUMBAR     MRI 12/2005; surg 2004  . ALLERGIC RHINITIS   . ASTHMA, UNSPECIFIED, UNSPECIFIED STATUS   . HYPERTENSION   . DYSLIPIDEMIA   . Mild renal insufficiency   . PNA (pneumonia)   . [id number](478.2)    Past Surgical History  Procedure Laterality Date  . Decompressive laminectomy      Fusion L4-5, L5-S1 ([facility]-2004)  . Right hand  1995  . Right knee  1990  . Right video-assisted thoracoscopic surgery, drainage of empyema.  02/12/2011  . Decortication of right lower lobe    . Closure of bronchopleural fistula, right lower lobe.    . Placement of wound on-q analgesia irrigation system.     Family History  Problem Relation Age of Onset  . Arthritis Other   . Hypertension Other   . Mental illness Mother   . Hypertension Mother    History  Substance Use Topics  . Smoking status: Current Every Day Smoker -- 0.25 packs/day for 40 years    Types: Cigarettes  . 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 Alcohol Use: No   OB History   Grav Para Term Preterm Abortions TAB SAB Ect Mult Living   5 4 4  1 1    4      Review of Systems  Constitutional: Negative for fever and chills.  Genitourinary: Positive  for vaginal pain. Negative for dysuria and vaginal discharge.      Allergies  Shrimp  Home Medications   Prior to Admission medications   Medication Sig Start Date End Date Taking? Authorizing Provider  alendronate (FOSAMAX) 70 MG tablet Take 70 mg by mouth every 7 (seven) days. Every Monday.Marland KitchenMarland KitchenMarland KitchenTake in the morning with a full glass of water, on an empty stomach, and do not take anything else by mouth or lie down for the next 30 min.    Historical Provider, MD  Cyanocobalamin (VITAMIN B-12 PO) Take 1 tablet by mouth daily.    Historical Provider, MD  diltiazem (CARDIZEM CD) 240 MG 24 hr capsule Take 240 mg by mouth 2 (two) times daily.     Historical Provider, MD  fluconazole (DIFLUCAN) 150 MG tablet Take 1 tablet (150 mg total) by mouth once. 12/22/13   Mercedes Strupp Camprubi-Soms, PA-C  Fluticasone-Salmeterol (ADVAIR DISKUS) 250-50 MCG/DOSE AEPB Inhale 1 puff into the lungs 2 (two) times daily.      Historical Provider, MD  furosemide (LASIX) 20 MG tablet Take 20 mg by mouth daily.     Historical Provider, MD  ibuprofen (ADVIL,MOTRIN) 200 MG tablet Take 200 mg by mouth daily. Pain    Historical Provider, MD  losartan (COZAAR) 100 MG tablet Take 50 mg by mouth daily.  Historical Provider, MD  montelukast (SINGULAIR) 10 MG tablet take 1 tablet by mouth once daily 06/19/11   Newt LukesValerie A Leschber, MD  Multiple Vitamins-Minerals (MULTIVITAMIN PO) Take 1 tablet by mouth daily.    Historical Provider, MD  pantoprazole (PROTONIX) 40 MG tablet Take 40 mg by mouth daily.      Historical Provider, MD  rosuvastatin (CRESTOR) 10 MG tablet Take 10 mg by mouth daily.     Historical Provider, MD  tiotropium (SPIRIVA) 18 MCG inhalation capsule Place 18 mcg into inhaler and inhale daily.      Historical Provider, MD  traMADol (ULTRAM) 50 MG tablet Take 1 tablet (50 mg total) by mouth every 6 (six) hours as needed for pain. 11/08/12   Hayden Rasmussenavid Mabe, NP   BP 123/79  Pulse 92  Temp(Src) 98.1 F (36.7 C) (Oral)   Resp 21  SpO2 99% Physical Exam  Nursing note and vitals reviewed. Constitutional: She is oriented to person, place, and time. She appears well-developed and well-nourished. No distress.  HENT:  Head: Normocephalic and atraumatic.  Eyes: EOM are normal. Pupils are equal, round, and reactive to light.  Neck: Neck supple.  Cardiovascular: Normal rate and regular rhythm.   Pulmonary/Chest: Effort normal. She has no wheezes. She has no rales.  Abdominal: There is no tenderness. There is no rebound and no guarding.  Neurological: She is alert and oriented to person, place, and time.  Skin: Skin is warm and dry. She is not diaphoretic.  Psychiatric: She has a normal mood and affect. Her behavior is normal.    ED Course  Procedures (including critical care time) Labs Review Labs Reviewed - No data to display  Imaging Review Ct Abdomen Pelvis Wo Contrast  12/22/2013   CLINICAL DATA:  Abdominal pain.    Rule out obstruction.  EXAM: CT ABDOMEN AND PELVIS WITHOUT CONTRAST  TECHNIQUE: Multidetector CT imaging of the abdomen and pelvis was performed following the standard protocol without IV contrast.  COMPARISON:  02/03/2011  FINDINGS: BODY WALL: Unremarkable.  LOWER CHEST: Pleural parenchymal scarring in the right lateral costophrenic sulcus.  ABDOMEN/PELVIS:  Liver: Numerous cysts within the liver which are grossly stable from 2012.  Biliary: No evidence of biliary obstruction or stone.  Pancreas: Chronic enlargement of the main pancreatic duct, measuring up to 5 mm. The multi year stability is compatible with a benign process; no visible mass or inflammation.  Spleen: Unremarkable.  Adrenals: Unremarkable.  Kidneys and ureters: Multiple bilateral renal cysts based on previous imaging. The largest and most discrete today is in the interpolar right kidney at 23 mm. No hydronephrosis or nephrolithiasis.  Bladder: Unremarkable.  Reproductive: Unremarkable.  Bowel: No obstruction. Relative, mild  circumferential thickening of the splenic flexure colon wall, without surrounding fat infiltration to suggest acute inflammation. Negative appendix.  Retroperitoneum: No mass or adenopathy.  Peritoneum: No ascites or pneumoperitoneum.  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 2.5 cm fusiform distal aortic aneurysm. These measurements are taken in the sagittal plane.  OSSEOUS: L4-5 and L5-S1 posterior lumbar interbody fusion with rod and pedicle screw fixation. No acute/adverse features. There is advanced adjacent level (L3-4) degenerative disc and facet disease with anterolisthesis. Changes are similar to previous. No acute fracture.  IMPRESSION: 1. Negative for bowel obstruction or other acute intra-abdominal abnormality. 2. 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 3. Chronic findings which are stable from 2012 are noted above.   Electronically Signed   By: Tiburcio PeaJonathan  Watts  M.D.   On: 12/22/2013 02:55   Dg Chest 2 View  12/21/2013   CLINICAL DATA:  Chest pain.  EXAM: CHEST  2 VIEW  COMPARISON:  07/22/2011  FINDINGS: Normal heart size. Chronic aortic tortuosity. Pulmonary hyperinflation and diffuse interstitial coarsening. There is chronic blunting of the lateral right costophrenic sulcus consistent with scarring. Linear scar along an upper major fissure in the lateral projection.  IMPRESSION: 1. No active cardiopulmonary disease. 2. COPD and right pleural scarring.   Electronically Signed   By: Tiburcio Pea M.D.   On: 12/21/2013 23:01     EKG Interpretation None      MDM   Final diagnoses:  BV (bacterial vaginosis)   Patient presents with vaginal complaints per EMR the patient had a pelvic exam less than 12 hours ago, which was normal. Along with significant workup for previous abdominal complaints, which have resolved. Wet prep shows clue cells, will treat for BV. Discussed lab results, imaging results, and treatment plan with the patient. Return  precautions given. Reports understanding and no other concerns at this time.  Patient is stable for discharge at this time.  Meds given in ED:  Medications - No data to display  Discharge Medication List as of 12/22/2013  2:08 PM    START taking these medications   Details  hydrocortisone cream 1 % Apply 1 application topically 2 (two) times daily. Do not apply to face, Starting 12/22/2013, Until Discontinued, Print    metroNIDAZOLE (FLAGYL) 500 MG tablet Take 1 tablet (500 mg total) by mouth 2 (two) times daily., Starting 12/22/2013, Until Discontinued, Print    ondansetron (ZOFRAN) 4 MG tablet Take 1 tablet (4 mg total) by mouth every 6 (six) hours., Starting 12/22/2013, Until Discontinued, Print    traMADol (ULTRAM) 50 MG tablet Take 1 tablet (50 mg total) by mouth every 6 (six) hours as needed., Starting 12/22/2013, Until Discontinued, Print            Mellody Drown, PA-C 12/23/13 1515

## 2013-12-22 NOTE — ED Notes (Signed)
Patient states has a yeast infection and she was here yesterday, "but I can't get anyone to treat me".  Patient has multiple complaints about "how I am treated.   I called the police here too".

## 2013-12-22 NOTE — Discharge Instructions (Signed)
Please call your doctor for a followup appointment within 24-48 hours. When you talk to your doctor please let them know that you were seen in the emergency department and have them acquire all of your records so that they can discuss the findings with you and formulate a treatment plan to fully care for your new and ongoing problems. Please call and set-up an appointment with Dr. Sharyn LullHarwani to be seen and assessed  Please continue to monitor aneurysms as noted on the CT scan of the stomach - will need to monitor of the width of these because if they are to get larger this can lead to irreversible consequences Please rest and stay hydrated If vaginal discharge is to worsen please [facility] to [facility] Please continue to monitor symptoms closely and if symptoms are to worsen or change (fever greater than 101, chills, sweating, nausea, vomiting, chest pain, shortness of breath, difficulty breathing, weakness, numbness, tingling, dizziness, stomach pain, blood in the stools, black tarry stools) please report back to the ED immediately    Abdominal Pain Many things can cause abdominal pain. Usually, abdominal pain is not caused by a disease and will improve without treatment. It can often be observed and treated at home. Your health care provider will do a physical exam and possibly order blood tests and X-rays to help determine the seriousness of your pain. However, in many cases, more time must pass before a clear cause of the pain can be found. Before that point, your health care provider may not know if you need more testing or further treatment. HOME CARE INSTRUCTIONS  Monitor your abdominal pain for any changes. The following actions may help to alleviate any discomfort you are experiencing:  Only take over-the-counter or prescription medicines as directed by your health care provider.  Do not take laxatives unless directed to do so by your health care provider.  Try a clear liquid diet (broth, tea,  or water) as directed by your health care provider. Slowly move to a bland diet as tolerated. SEEK MEDICAL CARE IF:  You have unexplained abdominal pain.  You have abdominal pain associated with nausea or diarrhea.  You have pain when you urinate or have a bowel movement.  You experience abdominal pain that wakes you in the night.  You have abdominal pain that is worsened or improved by eating food.  You have abdominal pain that is worsened with eating fatty foods.  You have a fever. SEEK IMMEDIATE MEDICAL CARE IF:   Your pain does not go away within 2 hours.  You keep throwing up (vomiting).  Your pain is felt only in portions of the abdomen, such as the right side or the left lower portion of the abdomen.  You pass bloody or black tarry stools. MAKE SURE YOU:  Understand these instructions.   Will watch your condition.   Will get help right away if you are not doing well or get worse.  Document Released: 02/11/2005 Document Revised: 05/09/2013 Document Reviewed: 01/11/2013 [facility] Patient Information 2015 [employer] [place]. This information is not intended to replace advice given to you by your health care provider. Make sure you discuss any questions you have with your health care provider.

## 2013-12-22 NOTE — ED Notes (Signed)
Pt alert and oriented at discharge.  Pt taken to the waiting room to [facility]  Pt escorted by this RN.

## 2013-12-23 NOTE — ED Provider Notes (Signed)
Medical screening examination/treatment/procedure(s) were performed by non-physician practitioner and as supervising physician I was immediately available for consultation/collaboration.   EKG Interpretation None        Lyanne CoKevin M Amenah Tucci, MD 12/23/13 [phone]

## 2013-12-23 NOTE — ED Provider Notes (Signed)
Medical screening examination/treatment/procedure(s) were performed by non-physician practitioner and as supervising physician I was immediately available for consultation/collaboration.   EKG Interpretation None        Samuel JesterKathleen Averianna Brugger, DO 12/23/13 1238

## 2013-12-25 NOTE — ED Provider Notes (Signed)
Medical screening examination/treatment/procedure(s) were performed by non-physician practitioner and as supervising physician I was immediately available for consultation/collaboration.     Suzi RootsKevin E Larue Lightner, MD 12/25/13 [phone]

## 2014-03-19 ENCOUNTER — Encounter (HOSPITAL_COMMUNITY): Payer: Self-pay | Admitting: Emergency Medicine

## 2014-04-09 ENCOUNTER — Ambulatory Visit: Payer: Medicare Other | Admitting: Family

## 2014-07-10 ENCOUNTER — Other Ambulatory Visit: Payer: Self-pay | Admitting: Internal Medicine

## 2014-08-25 ENCOUNTER — Encounter (HOSPITAL_COMMUNITY): Payer: Self-pay | Admitting: Emergency Medicine

## 2014-08-25 ENCOUNTER — Emergency Department (HOSPITAL_COMMUNITY)
Admission: EM | Admit: 2014-08-25 | Discharge: 2014-08-25 | Discharge status: Against Medical Advice | Payer: Medicare Other | Attending: Emergency Medicine | Admitting: Emergency Medicine

## 2014-08-25 DIAGNOSIS — R062 Wheezing: Secondary | ICD-10-CM | POA: Diagnosis present

## 2014-08-25 DIAGNOSIS — J45901 Unspecified asthma with (acute) exacerbation: Secondary | ICD-10-CM | POA: Diagnosis not present

## 2014-08-25 DIAGNOSIS — Z720 Tobacco use: Secondary | ICD-10-CM | POA: Diagnosis not present

## 2014-08-25 DIAGNOSIS — I10 Essential (primary) hypertension: Secondary | ICD-10-CM | POA: Diagnosis not present

## 2014-08-25 NOTE — ED Notes (Signed)
Pt st's she will call her MD tomorrow morning to have her medications called in for her.

## 2014-08-25 NOTE — ED Notes (Addendum)
Pt states is having difficulty with her insurance company-- [employer]-- "blocking medicine" says that the insurance company "sent wrong meds, sent that back, and now don't have the right meds" -- out of singulair---requesting a prescription for singulair  Pt has wheezes throughout-- able to talk in complete sentences.

## 2014-08-25 NOTE — ED Notes (Signed)
Pt st's she needs a breathing tx.  Pt st's when she gets the breathing tx she is gonna leave and will come back tomorrow.  Pt speaking in full sentences  No resp distress present at this time

## 2014-08-30 ENCOUNTER — Other Ambulatory Visit (HOSPITAL_COMMUNITY): Payer: Self-pay | Admitting: Cardiology

## 2014-08-30 DIAGNOSIS — Z1231 Encounter for screening mammogram for malignant neoplasm of breast: Secondary | ICD-10-CM

## 2014-09-21 ENCOUNTER — Ambulatory Visit (INDEPENDENT_AMBULATORY_CARE_PROVIDER_SITE_OTHER): Payer: Medicare Other | Admitting: Obstetrics and Gynecology

## 2014-09-21 ENCOUNTER — Encounter: Payer: Self-pay | Admitting: Obstetrics and Gynecology

## 2014-09-21 ENCOUNTER — Other Ambulatory Visit (HOSPITAL_COMMUNITY)
Admission: RE | Admit: 2014-09-21 | Discharge: 2014-09-21 | Disposition: A | Discharge status: Home / Self Care | Payer: Medicare Other | Source: Ambulatory Visit | Attending: Obstetrics and Gynecology | Admitting: Obstetrics and Gynecology

## 2014-09-21 VITALS — BP 108/75 | HR 96 | Temp 98.5°F | Ht 62.5 in | Wt 136.8 lb

## 2014-09-21 DIAGNOSIS — Z01419 Encounter for gynecological examination (general) (routine) without abnormal findings: Secondary | ICD-10-CM

## 2014-09-21 DIAGNOSIS — Z124 Encounter for screening for malignant neoplasm of cervix: Secondary | ICD-10-CM | POA: Diagnosis present

## 2014-09-21 DIAGNOSIS — Z1151 Encounter for screening for human papillomavirus (HPV): Secondary | ICD-10-CM

## 2014-09-21 LAB — POCT URINALYSIS DIP (DEVICE)
Bilirubin Urine: NEGATIVE
Glucose, UA: NEGATIVE mg/dL
Ketones, ur: NEGATIVE mg/dL
Leukocytes, UA: NEGATIVE
Nitrite: NEGATIVE
PROTEIN: NEGATIVE mg/dL
Specific Gravity, Urine: 1.015 (ref 1.005–1.030)
Urobilinogen, UA: 0.2 mg/dL (ref 0.0–1.0)
pH: 5 (ref 5.0–8.0)

## 2014-09-21 NOTE — Patient Instructions (Signed)
Preventive Care for Adults A healthy lifestyle and preventive care can promote health and wellness. Preventive health guidelines for women include the following key practices.  A routine yearly physical is a good way to check with your health care provider about your health and preventive screening. It is a chance to share any concerns and updates on your health and to receive a thorough exam.  Visit your dentist for a routine exam and preventive care every 6 months. Brush your teeth twice a day and floss once a day. Good oral hygiene prevents tooth decay and gum disease.  The frequency of eye exams is based on your age, health, family medical history, use of contact lenses, and other factors. Follow your health care provider's recommendations for frequency of eye exams.  Eat a healthy diet. Foods like vegetables, fruits, whole grains, low-fat dairy products, and lean protein foods contain the nutrients you need without too many calories. Decrease your intake of foods high in solid fats, added sugars, and salt. Eat the right amount of calories for you.Get information about a proper diet from your health care provider, if necessary.  Regular physical exercise is one of the most important things you can do for your health. Most adults should get at least 150 minutes of moderate-intensity exercise (any activity that increases your heart rate and causes you to sweat) each week. In addition, most adults need muscle-strengthening exercises on 2 or more days a week.  Maintain a healthy weight. The body mass index (BMI) is a screening tool to identify possible weight problems. It provides an estimate of body fat based on height and weight. Your health care provider can find your BMI and can help you achieve or maintain a healthy weight.For adults 20 years and older:  A BMI below 18.5 is considered underweight.  A BMI of 18.5 to 24.9 is normal.  A BMI of 25 to 29.9 is considered overweight.  A BMI of  30 and above is considered obese.  Maintain normal blood lipids and cholesterol levels by exercising and minimizing your intake of saturated fat. Eat a balanced diet with plenty of fruit and vegetables. Blood tests for lipids and cholesterol should begin at age 20 and be repeated every 5 years. If your lipid or cholesterol levels are high, you are over 50, or you are at high risk for heart disease, you may need your cholesterol levels checked more frequently.Ongoing high lipid and cholesterol levels should be treated with medicines if diet and exercise are not working.  If you smoke, find out from your health care provider how to quit. If you do not use tobacco, do not start.  Lung cancer screening is recommended for adults aged 55-80 years who are at high risk for developing lung cancer because of a history of smoking. A yearly low-dose CT scan of the lungs is recommended for people who have at least a 30-pack-year history of smoking and are a current smoker or have quit within the past 15 years. A pack year of smoking is smoking an average of 1 pack of cigarettes a day for 1 year (for example: 1 pack a day for 30 years or 2 packs a day for 15 years). Yearly screening should continue until the smoker has stopped smoking for at least 15 years. Yearly screening should be stopped for people who develop a health problem that would prevent them from having lung cancer treatment.  If you are pregnant, do not drink alcohol. If you are breastfeeding,   be very cautious about drinking alcohol. If you are not pregnant and choose to drink alcohol, do not have more than 1 drink per day. One drink is considered to be 12 ounces (355 mL) of beer, 5 ounces (148 mL) of wine, or 1.5 ounces (44 mL) of liquor.  Avoid use of street drugs. Do not share needles with anyone. Ask for help if you need support or instructions about stopping the use of drugs.  High blood pressure causes heart disease and increases the risk of  stroke. Your blood pressure should be checked at least every 1 to 2 years. Ongoing high blood pressure should be treated with medicines if weight loss and exercise do not work.  If you are 75-52 years old, ask your health care provider if you should take aspirin to prevent strokes.  Diabetes screening involves taking a blood sample to check your fasting blood sugar level. This should be done once every 3 years, after age 15, if you are within normal weight and without risk factors for diabetes. Testing should be considered at a younger age or be carried out more frequently if you are overweight and have at least 1 risk factor for diabetes.  Breast cancer screening is essential preventive care for women. You should practice "breast self-awareness." This means understanding the normal appearance and feel of your breasts and may include breast self-examination. Any changes detected, no matter how small, should be reported to a health care provider. Women in their 58s and 30s should have a clinical breast exam (CBE) by a health care provider as part of a regular health exam every 1 to 3 years. After age 16, women should have a CBE every year. Starting at age 53, women should consider having a mammogram (breast X-ray test) every year. Women who have a family history of breast cancer should talk to their health care provider about genetic screening. Women at a high risk of breast cancer should talk to their health care providers about having an MRI and a mammogram every year.  Breast cancer gene (BRCA)-related cancer risk assessment is recommended for women who have family members with BRCA-related cancers. BRCA-related cancers include breast, ovarian, tubal, and peritoneal cancers. Having family members with these cancers may be associated with an increased risk for harmful changes (mutations) in the breast cancer genes BRCA1 and BRCA2. Results of the assessment will determine the need for genetic counseling and  BRCA1 and BRCA2 testing.  Routine pelvic exams to screen for cancer are no longer recommended for nonpregnant women who are considered low risk for cancer of the pelvic organs (ovaries, uterus, and vagina) and who do not have symptoms. Ask your health care provider if a screening pelvic exam is right for you.  If you have had past treatment for cervical cancer or a condition that could lead to cancer, you need Pap tests and screening for cancer for at least 20 years after your treatment. If Pap tests have been discontinued, your risk factors (such as having a new sexual partner) need to be reassessed to determine if screening should be resumed. Some women have medical problems that increase the chance of getting cervical cancer. In these cases, your health care provider may recommend more frequent screening and Pap tests.  The HPV test is an additional test that may be used for cervical cancer screening. The HPV test looks for the virus that can cause the cell changes on the cervix. The cells collected during the Pap test can be  tested for HPV. The HPV test could be used to screen women aged 30 years and older, and should be used in women of any age who have unclear Pap test results. After the age of 30, women should have HPV testing at the same frequency as a Pap test.  Colorectal cancer can be detected and often prevented. Most routine colorectal cancer screening begins at the age of 50 years and continues through age 75 years. However, your health care provider may recommend screening at an earlier age if you have risk factors for colon cancer. On a yearly basis, your health care provider may provide home test kits to check for hidden blood in the stool. Use of a small camera at the end of a tube, to directly examine the colon (sigmoidoscopy or colonoscopy), can detect the earliest forms of colorectal cancer. Talk to your health care provider about this at age 50, when routine screening begins. Direct  exam of the colon should be repeated every 5-10 years through age 75 years, unless early forms of pre-cancerous polyps or small growths are found.  People who are at an increased risk for hepatitis B should be screened for this virus. You are considered at high risk for hepatitis B if:  You were born in a country where hepatitis B occurs often. Talk with your health care provider about which countries are considered high risk.  Your parents were born in a high-risk country and you have not received a shot to protect against hepatitis B (hepatitis B vaccine).  You have HIV or AIDS.  You use needles to inject street drugs.  You live with, or have sex with, someone who has hepatitis B.  You get hemodialysis treatment.  You take certain medicines for conditions like cancer, organ transplantation, and autoimmune conditions.  Hepatitis C blood testing is recommended for all people born from 1945 through 1965 and any individual with known risks for hepatitis C.  Practice safe sex. Use condoms and avoid high-risk sexual practices to reduce the spread of sexually transmitted infections (STIs). STIs include gonorrhea, chlamydia, syphilis, trichomonas, herpes, HPV, and human immunodeficiency virus (HIV). Herpes, HIV, and HPV are viral illnesses that have no cure. They can result in disability, cancer, and death.  You should be screened for sexually transmitted illnesses (STIs) including gonorrhea and chlamydia if:  You are sexually active and are younger than 24 years.  You are older than 24 years and your health care provider tells you that you are at risk for this type of infection.  Your sexual activity has changed since you were last screened and you are at an increased risk for chlamydia or gonorrhea. Ask your health care provider if you are at risk.  If you are at risk of being infected with HIV, it is recommended that you take a prescription medicine daily to prevent HIV infection. This is  called preexposure prophylaxis (PrEP). You are considered at risk if:  You are a heterosexual woman, are sexually active, and are at increased risk for HIV infection.  You take drugs by injection.  You are sexually active with a partner who has HIV.  Talk with your health care provider about whether you are at high risk of being infected with HIV. If you choose to begin PrEP, you should first be tested for HIV. You should then be tested every 3 months for as long as you are taking PrEP.  Osteoporosis is a disease in which the bones lose minerals and strength   with aging. This can result in serious bone fractures or breaks. The risk of osteoporosis can be identified using a bone density scan. Women ages 65 years and over and women at risk for fractures or osteoporosis should discuss screening with their health care providers. Ask your health care provider whether you should take a calcium supplement or vitamin D to reduce the rate of osteoporosis.  Menopause can be associated with physical symptoms and risks. Hormone replacement therapy is available to decrease symptoms and risks. You should talk to your health care provider about whether hormone replacement therapy is right for you.  Use sunscreen. Apply sunscreen liberally and repeatedly throughout the day. You should seek shade when your shadow is shorter than you. Protect yourself by wearing long sleeves, pants, a wide-brimmed hat, and sunglasses year round, whenever you are outdoors.  Once a month, do a whole body skin exam, using a mirror to look at the skin on your back. Tell your health care provider of new moles, moles that have irregular borders, moles that are larger than a pencil eraser, or moles that have changed in shape or color.  Stay current with required vaccines (immunizations).  Influenza vaccine. All adults should be immunized every year.  Tetanus, diphtheria, and acellular pertussis (Td, Tdap) vaccine. Pregnant women should  receive 1 dose of Tdap vaccine during each pregnancy. The dose should be obtained regardless of the length of time since the last dose. Immunization is preferred during the 27th-36th week of gestation. An adult who has not previously received Tdap or who does not know her vaccine status should receive 1 dose of Tdap. This initial dose should be followed by tetanus and diphtheria toxoids (Td) booster doses every 10 years. Adults with an unknown or incomplete history of completing a 3-dose immunization series with Td-containing vaccines should begin or complete a primary immunization series including a Tdap dose. Adults should receive a Td booster every 10 years.  Varicella vaccine. An adult without evidence of immunity to varicella should receive 2 doses or a second dose if she has previously received 1 dose. Pregnant females who do not have evidence of immunity should receive the first dose after pregnancy. This first dose should be obtained before leaving the health care facility. The second dose should be obtained 4-8 weeks after the first dose.  Human papillomavirus (HPV) vaccine. Females aged 13-26 years who have not received the vaccine previously should obtain the 3-dose series. The vaccine is not recommended for use in pregnant females. However, pregnancy testing is not needed before receiving a dose. If a female is found to be pregnant after receiving a dose, no treatment is needed. In that case, the remaining doses should be delayed until after the pregnancy. Immunization is recommended for any person with an immunocompromised condition through the age of 26 years if she did not get any or all doses earlier. During the 3-dose series, the second dose should be obtained 4-8 weeks after the first dose. The third dose should be obtained 24 weeks after the first dose and 16 weeks after the second dose.  Zoster vaccine. One dose is recommended for adults aged 60 years or older unless certain conditions are  present.  Measles, mumps, and rubella (MMR) vaccine. Adults born before 1957 generally are considered immune to measles and mumps. Adults born in 1957 or later should have 1 or more doses of MMR vaccine unless there is a contraindication to the vaccine or there is laboratory evidence of immunity to   each of the three diseases. A routine second dose of MMR vaccine should be obtained at least 28 days after the first dose for students attending postsecondary schools, health care workers, or international travelers. People who received inactivated measles vaccine or an unknown type of measles vaccine during 1963-1967 should receive 2 doses of MMR vaccine. People who received inactivated mumps vaccine or an unknown type of mumps vaccine before 1979 and are at high risk for mumps infection should consider immunization with 2 doses of MMR vaccine. For females of childbearing age, rubella immunity should be determined. If there is no evidence of immunity, females who are not pregnant should be vaccinated. If there is no evidence of immunity, females who are pregnant should delay immunization until after pregnancy. Unvaccinated health care workers born before 1957 who lack laboratory evidence of measles, mumps, or rubella immunity or laboratory confirmation of disease should consider measles and mumps immunization with 2 doses of MMR vaccine or rubella immunization with 1 dose of MMR vaccine.  Pneumococcal 13-valent conjugate (PCV13) vaccine. When indicated, a person who is uncertain of her immunization history and has no record of immunization should receive the PCV13 vaccine. An adult aged 19 years or older who has certain medical conditions and has not been previously immunized should receive 1 dose of PCV13 vaccine. This PCV13 should be followed with a dose of pneumococcal polysaccharide (PPSV23) vaccine. The PPSV23 vaccine dose should be obtained at least 8 weeks after the dose of PCV13 vaccine. An adult aged 19  years or older who has certain medical conditions and previously received 1 or more doses of PPSV23 vaccine should receive 1 dose of PCV13. The PCV13 vaccine dose should be obtained 1 or more years after the last PPSV23 vaccine dose.  Pneumococcal polysaccharide (PPSV23) vaccine. When PCV13 is also indicated, PCV13 should be obtained first. All adults aged 65 years and older should be immunized. An adult younger than age 65 years who has certain medical conditions should be immunized. Any person who resides in a nursing home or long-term care facility should be immunized. An adult smoker should be immunized. People with an immunocompromised condition and certain other conditions should receive both PCV13 and PPSV23 vaccines. People with human immunodeficiency virus (HIV) infection should be immunized as soon as possible after diagnosis. Immunization during chemotherapy or radiation therapy should be avoided. Routine use of PPSV23 vaccine is not recommended for American Indians, [place] Natives, or people younger than 65 years unless there are medical conditions that require PPSV23 vaccine. When indicated, people who have unknown immunization and have no record of immunization should receive PPSV23 vaccine. One-time revaccination 5 years after the first dose of PPSV23 is recommended for people aged 19-64 years who have chronic kidney failure, nephrotic syndrome, asplenia, or immunocompromised conditions. People who received 1-2 doses of PPSV23 before age 65 years should receive another dose of PPSV23 vaccine at age 65 years or later if at least 5 years have passed since the previous dose. Doses of PPSV23 are not needed for people immunized with PPSV23 at or after age 65 years.  Meningococcal vaccine. Adults with asplenia or persistent complement component deficiencies should receive 2 doses of quadrivalent meningococcal conjugate (MenACWY-D) vaccine. The doses should be obtained at least 2 months apart.  Microbiologists working with certain meningococcal bacteria, military recruits, people at risk during an outbreak, and people who travel to or live in countries with a high rate of meningitis should be immunized. A first-year college student up through age   21 years who is living in a residence hall should receive a dose if she did not receive a dose on or after her 16th birthday. Adults who have certain high-risk conditions should receive one or more doses of vaccine.  Hepatitis A vaccine. Adults who wish to be protected from this disease, have certain high-risk conditions, work with hepatitis A-infected animals, work in hepatitis A research labs, or travel to or work in countries with a high rate of hepatitis A should be immunized. Adults who were previously unvaccinated and who anticipate close contact with an international adoptee during the first 60 days after arrival in the [place] from a country with a high rate of hepatitis A should be immunized.  Hepatitis B vaccine. Adults who wish to be protected from this disease, have certain high-risk conditions, may be exposed to blood or other infectious body fluids, are household contacts or sex partners of hepatitis B positive people, are clients or workers in certain care facilities, or travel to or work in countries with a high rate of hepatitis B should be immunized.  Haemophilus influenzae type b (Hib) vaccine. A previously unvaccinated person with asplenia or sickle cell disease or having a scheduled splenectomy should receive 1 dose of Hib vaccine. Regardless of previous immunization, a recipient of a hematopoietic stem cell transplant should receive a 3-dose series 6-12 months after her successful transplant. Hib vaccine is not recommended for adults with HIV infection. Preventive Services / Frequency Ages 64 to 68 years  Blood pressure check.** / Every 1 to 2 years.  Lipid and cholesterol check.** / Every 5 years beginning at age  22.  Clinical breast exam.** / Every 3 years for women in their 88s and 53s.  BRCA-related cancer risk assessment.** / For women who have family members with a BRCA-related cancer (breast, ovarian, tubal, or peritoneal cancers).  Pap test.** / Every 2 years from ages 90 through 51. Every 3 years starting at age 21 through age 56 or 3 with a history of 3 consecutive normal Pap tests.  HPV screening.** / Every 3 years from ages 24 through ages 1 to 46 with a history of 3 consecutive normal Pap tests.  Hepatitis C blood test.** / For any individual with known risks for hepatitis C.  Skin self-exam. / Monthly.  Influenza vaccine. / Every year.  Tetanus, diphtheria, and acellular pertussis (Tdap, Td) vaccine.** / Consult your health care provider. Pregnant women should receive 1 dose of Tdap vaccine during each pregnancy. 1 dose of Td every 10 years.  Varicella vaccine.** / Consult your health care provider. Pregnant females who do not have evidence of immunity should receive the first dose after pregnancy.  HPV vaccine. / 3 doses over 6 months, if 72 and younger. The vaccine is not recommended for use in pregnant females. However, pregnancy testing is not needed before receiving a dose.  Measles, mumps, rubella (MMR) vaccine.** / You need at least 1 dose of MMR if you were born in 1957 or later. You may also need a 2nd dose. For females of childbearing age, rubella immunity should be determined. If there is no evidence of immunity, females who are not pregnant should be vaccinated. If there is no evidence of immunity, females who are pregnant should delay immunization until after pregnancy.  Pneumococcal 13-valent conjugate (PCV13) vaccine.** / Consult your health care provider.  Pneumococcal polysaccharide (PPSV23) vaccine.** / 1 to 2 doses if you smoke cigarettes or if you have certain conditions.  Meningococcal vaccine.** /  1 dose if you are age 19 to 21 years and a first-year college  student living in a residence hall, or have one of several medical conditions, you need to get vaccinated against meningococcal disease. You may also need additional booster doses.  Hepatitis A vaccine.** / Consult your health care provider.  Hepatitis B vaccine.** / Consult your health care provider.  Haemophilus influenzae type b (Hib) vaccine.** / Consult your health care provider. Ages 40 to 64 years  Blood pressure check.** / Every 1 to 2 years.  Lipid and cholesterol check.** / Every 5 years beginning at age 20 years.  Lung cancer screening. / Every year if you are aged 55-80 years and have a 30-pack-year history of smoking and currently smoke or have quit within the past 15 years. Yearly screening is stopped once you have quit smoking for at least 15 years or develop a health problem that would prevent you from having lung cancer treatment.  Clinical breast exam.** / Every year after age 40 years.  BRCA-related cancer risk assessment.** / For women who have family members with a BRCA-related cancer (breast, ovarian, tubal, or peritoneal cancers).  Mammogram.** / Every year beginning at age 40 years and continuing for as long as you are in good health. Consult with your health care provider.  Pap test.** / Every 3 years starting at age 30 years through age 65 or 70 years with a history of 3 consecutive normal Pap tests.  HPV screening.** / Every 3 years from ages 30 years through ages 65 to 70 years with a history of 3 consecutive normal Pap tests.  Fecal occult blood test (FOBT) of stool. / Every year beginning at age 50 years and continuing until age 75 years. You may not need to do this test if you get a colonoscopy every 10 years.  Flexible sigmoidoscopy or colonoscopy.** / Every 5 years for a flexible sigmoidoscopy or every 10 years for a colonoscopy beginning at age 50 years and continuing until age 75 years.  Hepatitis C blood test.** / For all people born from 1945 through  1965 and any individual with known risks for hepatitis C.  Skin self-exam. / Monthly.  Influenza vaccine. / Every year.  Tetanus, diphtheria, and acellular pertussis (Tdap/Td) vaccine.** / Consult your health care provider. Pregnant women should receive 1 dose of Tdap vaccine during each pregnancy. 1 dose of Td every 10 years.  Varicella vaccine.** / Consult your health care provider. Pregnant females who do not have evidence of immunity should receive the first dose after pregnancy.  Zoster vaccine.** / 1 dose for adults aged 60 years or older.  Measles, mumps, rubella (MMR) vaccine.** / You need at least 1 dose of MMR if you were born in 1957 or later. You may also need a 2nd dose. For females of childbearing age, rubella immunity should be determined. If there is no evidence of immunity, females who are not pregnant should be vaccinated. If there is no evidence of immunity, females who are pregnant should delay immunization until after pregnancy.  Pneumococcal 13-valent conjugate (PCV13) vaccine.** / Consult your health care provider.  Pneumococcal polysaccharide (PPSV23) vaccine.** / 1 to 2 doses if you smoke cigarettes or if you have certain conditions.  Meningococcal vaccine.** / Consult your health care provider.  Hepatitis A vaccine.** / Consult your health care provider.  Hepatitis B vaccine.** / Consult your health care provider.  Haemophilus influenzae type b (Hib) vaccine.** / Consult your health care provider. Ages 65   years and over  Blood pressure check.** / Every 1 to 2 years.  Lipid and cholesterol check.** / Every 5 years beginning at age 22 years.  Lung cancer screening. / Every year if you are aged 73-80 years and have a 30-pack-year history of smoking and currently smoke or have quit within the past 15 years. Yearly screening is stopped once you have quit smoking for at least 15 years or develop a health problem that would prevent you from having lung cancer  treatment.  Clinical breast exam.** / Every year after age 4 years.  BRCA-related cancer risk assessment.** / For women who have family members with a BRCA-related cancer (breast, ovarian, tubal, or peritoneal cancers).  Mammogram.** / Every year beginning at age 40 years and continuing for as long as you are in good health. Consult with your health care provider.  Pap test.** / Every 3 years starting at age 9 years through age 34 or 91 years with 3 consecutive normal Pap tests. Testing can be stopped between 65 and 70 years with 3 consecutive normal Pap tests and no abnormal Pap or HPV tests in the past 10 years.  HPV screening.** / Every 3 years from ages 57 years through ages 64 or 45 years with a history of 3 consecutive normal Pap tests. Testing can be stopped between 65 and 70 years with 3 consecutive normal Pap tests and no abnormal Pap or HPV tests in the past 10 years.  Fecal occult blood test (FOBT) of stool. / Every year beginning at age 15 years and continuing until age 17 years. You may not need to do this test if you get a colonoscopy every 10 years.  Flexible sigmoidoscopy or colonoscopy.** / Every 5 years for a flexible sigmoidoscopy or every 10 years for a colonoscopy beginning at age 86 years and continuing until age 71 years.  Hepatitis C blood test.** / For all people born from [birth year] through 1963 and any individual with known risks for hepatitis C.  Osteoporosis screening.** / A one-time screening for women ages 83 years and over and women at risk for fractures or osteoporosis.  Skin self-exam. / Monthly.  Influenza vaccine. / Every year.  Tetanus, diphtheria, and acellular pertussis (Tdap/Td) vaccine.** / 1 dose of Td every 10 years.  Varicella vaccine.** / Consult your health care provider.  Zoster vaccine.** / 1 dose for adults aged 61 years or older.  Pneumococcal 13-valent conjugate (PCV13) vaccine.** / Consult your health care provider.  Pneumococcal  polysaccharide (PPSV23) vaccine.** / 1 dose for all adults aged 28 years and older.  Meningococcal vaccine.** / Consult your health care provider.  Hepatitis A vaccine.** / Consult your health care provider.  Hepatitis B vaccine.** / Consult your health care provider.  Haemophilus influenzae type b (Hib) vaccine.** / Consult your health care provider. ** Family history and personal history of risk and conditions may change your health care provider's recommendations. Document Released: 06/30/2001 Document Revised: 09/18/2013 Document Reviewed: 09/29/2010 [facility] Patient Information 2015 [place], [place]. This information is not intended to replace advice given to you by your health care provider. Make sure you discuss any questions you have with your health care provider.

## 2014-09-21 NOTE — Progress Notes (Signed)
Here for yearly checkup. UA per patient request .

## 2014-09-21 NOTE — Addendum Note (Signed)
Addended by: Jill SideAY, DIANE L on: 09/21/2014 11:36 AM   Modules accepted: Orders

## 2014-09-21 NOTE — Progress Notes (Signed)
  Subjective:     [name] is a 69 y.o. female [phone] postmenopausal with BMI 24 who is here for a comprehensive physical exam. The patient reports some vaginal irritation and is concerened that it may be a yeast infection. She is not sexually active and has not been for close to 20 years.  History   Social History  . Marital Status: Single    Spouse Name: N/A  . Number of Children: N/A  . Years of Education: N/A   Occupational History  . Not on file.   Social History Main Topics  . Smoking status: Current Every Day Smoker -- 0.25 packs/day for 40 years    Types: Cigarettes  . 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 Alcohol Use: No  . Drug Use: No  . Sexual Activity: No     Comment: no sex x 20 yrs   Other Topics Concern  . Not on file   Social History Narrative   Health Maintenance  Topic Date Due  . COLONOSCOPY  08/26/1997  . ZOSTAVAX  08/27/2007  . PNA vac Low Risk Adult (1 of 2 - PCV13) 08/26/2012  . INFLUENZA VACCINE  12/17/2014  . MAMMOGRAM  08/23/2015  . TETANUS/TDAP  02/21/2019  . DEXA SCAN  Completed       Review of Systems Pertinent items are noted in HPI.   Objective:      GENERAL: Well-developed, well-nourished female in no acute distress.  HEENT: Normocephalic, atraumatic. Sclerae anicteric.  NECK: Supple. Normal thyroid.  LUNGS: Clear to auscultation bilaterally.  HEART: Regular rate and rhythm. BREASTS: Symmetric in size. No palpable masses or lymphadenopathy, skin changes, or nipple drainage. ABDOMEN: Soft, nontender, nondistended. No organomegaly. PELVIC: Normal external female genitalia. Vagina is atrophic.  Thick white discharge. Normal appearing cervix. Uterus is normal in size. No adnexal mass or tenderness. EXTREMITIES: No cyanosis, clubbing, or edema, 2+ distal pulses.    Assessment:    Healthy female exam.      Plan:    Pap smear collected  today Patient scheduled for screening mammogram on 09/25/2014 Patient advised to perform monthly self breast and vulva exam Patient will be contacted with any abnormal results See After Visit Summary for Counseling Recommendations

## 2014-09-22 LAB — WET PREP, GENITAL
TRICH WET PREP: NONE SEEN
Yeast Wet Prep HPF POC: NONE SEEN

## 2014-09-24 ENCOUNTER — Telehealth: Payer: Self-pay

## 2014-09-24 LAB — CYTOLOGY - PAP

## 2014-09-24 MED ORDER — METRONIDAZOLE 500 MG PO TABS: 500.0000 mg | ORAL_TABLET | Freq: Two times a day (BID) | ORAL | Status: DC

## 2014-09-24 NOTE — Telephone Encounter (Signed)
-----   Message from Catalina AntiguaPeggy Constant, MD sent at 09/24/2014 10:04 AM EDT ----- Please inform patient of positive BV. Flagyl e-prescribed  Thanks  Kinder Morgan EnergyPeggy

## 2014-09-24 NOTE — Telephone Encounter (Signed)
Patient returned call. Informed her of results and RX at pharmacy. Patient verbalized understanding and gratitude. No questions or concerns.

## 2014-09-24 NOTE — Addendum Note (Signed)
Addended by: Catalina AntiguaONSTANT, Jousha Schwandt on: 09/24/2014 10:04 AM   Modules accepted: Orders

## 2014-09-24 NOTE — Telephone Encounter (Signed)
Attempted to contact. Left message on mobile number stating we are calling with results, please call and leave message stating whether or not it is OK to leave detailed message. Home phone continued to ring-- unable to leave message.

## 2014-09-25 ENCOUNTER — Ambulatory Visit (HOSPITAL_COMMUNITY)
Admission: RE | Admit: 2014-09-25 | Discharge: 2014-09-25 | Disposition: A | Discharge status: Home / Self Care | Payer: Medicare Other | Source: Ambulatory Visit | Attending: Cardiology | Admitting: Cardiology

## 2014-09-25 DIAGNOSIS — Z1231 Encounter for screening mammogram for malignant neoplasm of breast: Secondary | ICD-10-CM | POA: Diagnosis not present

## 2014-11-23 ENCOUNTER — Emergency Department (INDEPENDENT_AMBULATORY_CARE_PROVIDER_SITE_OTHER)
Admission: EM | Admit: 2014-11-23 | Discharge: 2014-11-23 | Disposition: A | Discharge status: Home / Self Care | Payer: Medicare Other | Source: Home / Self Care | Attending: Family Medicine | Admitting: Family Medicine

## 2014-11-23 ENCOUNTER — Encounter (HOSPITAL_COMMUNITY): Payer: Self-pay | Admitting: Emergency Medicine

## 2014-11-23 DIAGNOSIS — K088 Other specified disorders of teeth and supporting structures: Secondary | ICD-10-CM

## 2014-11-23 DIAGNOSIS — K0889 Other specified disorders of teeth and supporting structures: Secondary | ICD-10-CM

## 2014-11-23 MED ORDER — DICLOFENAC POTASSIUM 50 MG PO TABS: 50.0000 mg | ORAL_TABLET | Freq: Three times a day (TID) | ORAL | Status: DC

## 2014-11-23 NOTE — ED Provider Notes (Signed)
CSN: [id number]     Arrival date & time 11/23/14  1301 History   First MD Initiated Contact with Patient 11/23/14 1315     Chief Complaint  Patient presents with  . Dental Pain  . Medication Refill   (Consider location/radiation/quality/duration/timing/severity/associated sxs/prior Treatment) Patient is a 69 y.o. female presenting with tooth pain. The history is provided by the patient.  Dental Pain Location:  Lower Lower teeth location:  18/LL 2nd molar Quality:  Throbbing Severity:  Mild Onset quality:  Gradual Duration:  2 weeks Progression:  Unchanged Chronicity:  New Context: poor dentition   Ineffective treatments: tyl #3 not effective. Associated symptoms: no facial swelling and no fever   Risk factors: lack of dental care and periodontal disease     Past Medical History  Diagnosis Date  . TOBACCO ABUSE   . SPINAL STENOSIS, LUMBAR     MRI 12/2005; surg 2004  . ALLERGIC RHINITIS   . ASTHMA, UNSPECIFIED, UNSPECIFIED STATUS   . HYPERTENSION   . DYSLIPIDEMIA   . Mild renal insufficiency   . PNA (pneumonia)   . ONGEXBMW(413.2Headache(784.0)    Past Surgical History  Procedure Laterality Date  . Decompressive laminectomy      Fusion L4-5, L5-S1 ([facility]-2004)  . Right hand  1995  . Right knee  1990  . Right video-assisted thoracoscopic surgery, drainage of empyema.  02/12/2011  . Decortication of right lower lobe    . Closure of bronchopleural fistula, right lower lobe.    . Placement of wound on-q analgesia irrigation system.     Family History  Problem Relation Age of Onset  . Arthritis Other   . Hypertension Other   . Mental illness Mother   . Hypertension Mother    History  Substance Use Topics  . Smoking status: Current Every Day Smoker -- 0.25 packs/day for 40 years    Types: Cigarettes  . 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 Alcohol Use: No   OB History    Gravida Para Term Preterm AB TAB  SAB Ectopic Multiple Living   5 4 4  1 1    4      Review of Systems  Constitutional: Negative.  Negative for fever.  HENT: Positive for dental problem. Negative for facial swelling.     Allergies  Shrimp  Home Medications   Prior to Admission medications   Medication Sig Start Date End Date Taking? Authorizing Provider  alendronate (FOSAMAX) 70 MG tablet Take 70 mg by mouth every 7 (seven) days. Every Monday.[name].[name].[name].Take in the morning with a full glass of water, on an empty stomach, and do not take anything else by mouth or lie down for the next 30 min.   Yes Historical Provider, MD  Fluticasone-Salmeterol (ADVAIR DISKUS) 250-50 MCG/DOSE AEPB Inhale 1 puff into the lungs 2 (two) times daily.     Yes Historical Provider, MD  furosemide (LASIX) 20 MG tablet Take 20 mg by mouth daily.    Yes Historical Provider, MD  rosuvastatin (CRESTOR) 10 MG tablet Take 10 mg by mouth daily.    Yes Historical Provider, MD  tiotropium (SPIRIVA) 18 MCG inhalation capsule Place 18 mcg into inhaler and inhale daily.     Yes Historical Provider, MD  Cyanocobalamin (VITAMIN B-12 PO) Take 1 tablet by mouth daily.    Historical Provider, MD  diclofenac (CATAFLAM) 50 MG tablet Take 1 tablet (50 mg total) by  mouth 3 (three) times daily. For dental pain 11/23/14   Linna Hoff, MD  diltiazem (CARDIZEM CD) 240 MG 24 hr capsule Take 240 mg by mouth 2 (two) times daily.     Historical Provider, MD  losartan (COZAAR) 100 MG tablet Take 50 mg by mouth daily.     Historical Provider, MD  metroNIDAZOLE (FLAGYL) 500 MG tablet Take 1 tablet (500 mg total) by mouth 2 (two) times daily. 09/24/14   Peggy Constant, MD  montelukast (SINGULAIR) 10 MG tablet Take 10 mg by mouth at bedtime.    Historical Provider, MD  Multiple Vitamins-Minerals (MULTIVITAMIN PO) Take 1 tablet by mouth daily.    Historical Provider, MD  pantoprazole (PROTONIX) 40 MG tablet Take 40 mg by mouth daily.      Historical Provider, MD  traMADol (ULTRAM) 50 MG  tablet Take 1 tablet (50 mg total) by mouth every 6 (six) hours as needed. 12/22/13   Lauren Parker, PA-C   BP 145/100 mmHg  Pulse 87  Temp(Src) 98.7 F (37.1 C) (Oral)  Resp 24  SpO2 98% Physical Exam  Constitutional: She is oriented to person, place, and time. She appears well-developed and well-nourished.  HENT:  Right Ear: External ear normal.  Left Ear: External ear normal.  Mouth/Throat: Uvula is midline and oropharynx is clear and moist. Abnormal dentition. Dental caries present. No dental abscesses.    Eyes: Pupils are equal, round, and reactive to light.  Neck: Normal range of motion. Neck supple.  Lymphadenopathy:    She has no cervical adenopathy.  Neurological: She is alert and oriented to person, place, and time.  Skin: Skin is warm and dry.  Nursing note and vitals reviewed.   ED Course  Procedures (including critical care time) Labs Review Labs Reviewed - No data to display  Imaging Review No results found.   MDM   1. Pain, dental       Linna Hoff, MD 11/23/14 [phone]

## 2014-11-23 NOTE — Discharge Instructions (Signed)
Take medicine as prescribed, see your dentist as soon as possible °

## 2014-11-23 NOTE — ED Notes (Signed)
Pt was prescribed Tylenol with codeine about a month ago for dental pain.  She is scheduled for dental surgery next Tuesday, but is out of pain medication and wants something stronger than the medicine she was prescribed.

## 2015-01-30 ENCOUNTER — Emergency Department (HOSPITAL_COMMUNITY)
Admission: EM | Admit: 2015-01-30 | Discharge: 2015-01-30 | Disposition: A | Discharge status: Home / Self Care | Payer: Medicare Other | Attending: Emergency Medicine | Admitting: Emergency Medicine

## 2015-01-30 ENCOUNTER — Encounter (HOSPITAL_COMMUNITY): Payer: Self-pay

## 2015-01-30 DIAGNOSIS — R000 Tachycardia, unspecified: Secondary | ICD-10-CM | POA: Insufficient documentation

## 2015-01-30 DIAGNOSIS — Z79899 Other long term (current) drug therapy: Secondary | ICD-10-CM | POA: Diagnosis not present

## 2015-01-30 DIAGNOSIS — I10 Essential (primary) hypertension: Secondary | ICD-10-CM | POA: Insufficient documentation

## 2015-01-30 DIAGNOSIS — Z8701 Personal history of pneumonia (recurrent): Secondary | ICD-10-CM | POA: Diagnosis not present

## 2015-01-30 DIAGNOSIS — J45909 Unspecified asthma, uncomplicated: Secondary | ICD-10-CM | POA: Diagnosis present

## 2015-01-30 DIAGNOSIS — Z7951 Long term (current) use of inhaled steroids: Secondary | ICD-10-CM | POA: Insufficient documentation

## 2015-01-30 DIAGNOSIS — Z791 Long term (current) use of non-steroidal anti-inflammatories (NSAID): Secondary | ICD-10-CM | POA: Insufficient documentation

## 2015-01-30 DIAGNOSIS — Z8739 Personal history of other diseases of the musculoskeletal system and connective tissue: Secondary | ICD-10-CM | POA: Insufficient documentation

## 2015-01-30 DIAGNOSIS — E785 Hyperlipidemia, unspecified: Secondary | ICD-10-CM | POA: Insufficient documentation

## 2015-01-30 DIAGNOSIS — J45901 Unspecified asthma with (acute) exacerbation: Secondary | ICD-10-CM

## 2015-01-30 DIAGNOSIS — Z720 Tobacco use: Secondary | ICD-10-CM | POA: Insufficient documentation

## 2015-01-30 DIAGNOSIS — Z87448 Personal history of other diseases of urinary system: Secondary | ICD-10-CM | POA: Diagnosis not present

## 2015-01-30 MED ORDER — ALBUTEROL SULFATE (2.5 MG/3ML) 0.083% IN NEBU
INHALATION_SOLUTION | RESPIRATORY_TRACT | Status: AC
  Filled 2015-01-30: qty 6

## 2015-01-30 MED ORDER — ALBUTEROL SULFATE (2.5 MG/3ML) 0.083% IN NEBU
5.0000 mg | INHALATION_SOLUTION | Freq: Once | RESPIRATORY_TRACT | Status: AC
  Administered 2015-01-30: 5 mg via RESPIRATORY_TRACT

## 2015-01-30 MED ORDER — ALBUTEROL SULFATE HFA 108 (90 BASE) MCG/ACT IN AERS
2.0000 | INHALATION_SPRAY | Freq: Once | RESPIRATORY_TRACT | Status: AC
  Administered 2015-01-30: 2 via RESPIRATORY_TRACT
  Filled 2015-01-30: qty 6.7

## 2015-01-30 MED ORDER — IPRATROPIUM-ALBUTEROL 0.5-2.5 (3) MG/3ML IN SOLN
6.0000 mL | Freq: Once | RESPIRATORY_TRACT | Status: AC
  Administered 2015-01-30: 6 mL via RESPIRATORY_TRACT
  Filled 2015-01-30: qty 6

## 2015-01-30 MED ORDER — PREDNISONE 20 MG PO TABS: 60.0000 mg | ORAL_TABLET | Freq: Every day | ORAL | Status: DC

## 2015-01-30 MED ORDER — PREDNISONE 20 MG PO TABS
60.0000 mg | ORAL_TABLET | Freq: Once | ORAL | Status: AC
  Administered 2015-01-30: 60 mg via ORAL
  Filled 2015-01-30: qty 3

## 2015-01-30 NOTE — ED Provider Notes (Signed)
CSN: [id number]     Arrival date & time 01/30/15  0308 History   First MD Initiated Contact with Patient 01/30/15 0402     Chief Complaint  Patient presents with  . Asthma     (Consider location/radiation/quality/duration/timing/severity/associated sxs/prior Treatment) HPI  [name] is a 69 y.o. female with past medical history of asthma presenting today with an exacerbation. Patient states that she has had some shortness of breath and allergies for the last week.  She was walking to the kitchen this evening, and she experience worsening shortness of breath and wheezing. This is consistent with her asthma. Patient decided to come to emergency department for evaluation rather than wait for it to get worse. She has been using her albuterol inhaler over the last couple days with mild relief. She denies any fevers or productive cough. She has no further complaints.  10 Systems reviewed and are negative for acute change except as noted in the HPI.    Past Medical History  Diagnosis Date  . TOBACCO ABUSE   . SPINAL STENOSIS, LUMBAR     MRI 12/2005; surg 2004  . ALLERGIC RHINITIS   . ASTHMA, UNSPECIFIED, UNSPECIFIED STATUS   . HYPERTENSION   . DYSLIPIDEMIA   . Mild renal insufficiency   . PNA (pneumonia)   . JYNWGNFA(213.0)    Past Surgical History  Procedure Laterality Date  . Decompressive laminectomy      Fusion L4-5, L5-S1 ([facility]-2004)  . Right hand  1995  . Right knee  1990  . Right video-assisted thoracoscopic surgery, drainage of empyema.  02/12/2011  . Decortication of right lower lobe    . Closure of bronchopleural fistula, right lower lobe.    . Placement of wound on-q analgesia irrigation system.     Family History  Problem Relation Age of Onset  . Arthritis Other   . Hypertension Other   . Mental illness Mother   . Hypertension Mother    Social History  Substance Use Topics  . Smoking status: Current Every Day Smoker -- 0.25 packs/day for 40 years   Types: Cigarettes  . 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 Alcohol Use: No   OB History    Gravida Para Term Preterm AB TAB SAB Ectopic Multiple Living   5 4 4  1 1    4      Review of Systems    Allergies  Shrimp  Home Medications   Prior to Admission medications   Medication Sig Start Date End Date Taking? Authorizing Provider  albuterol (PROVENTIL HFA;VENTOLIN HFA) 108 (90 BASE) MCG/ACT inhaler Inhale 1-2 puffs into the lungs every 6 (six) hours as needed for wheezing or shortness of breath.   Yes Historical Provider, MD  diltiazem (CARDIZEM CD) 240 MG 24 hr capsule Take 240 mg by mouth 2 (two) times daily.    Yes Historical Provider, MD  Fluticasone-Salmeterol (ADVAIR DISKUS) 250-50 MCG/DOSE AEPB Inhale 1 puff into the lungs 2 (two) times daily.     Yes Historical Provider, MD  furosemide (LASIX) 20 MG tablet Take 20 mg by mouth daily.    Yes Historical Provider, MD  losartan (COZAAR) 100 MG tablet Take 100 mg by mouth daily.    Yes Historical Provider, MD  montelukast (SINGULAIR) 10 MG tablet Take 10 mg by mouth at bedtime.   Yes Historical Provider, MD  Multiple Vitamins-Minerals (MULTIVITAMIN PO) Take 1 tablet by  mouth daily.   Yes Historical Provider, MD  pantoprazole (PROTONIX) 40 MG tablet Take 40 mg by mouth daily.     Yes Historical Provider, MD  rosuvastatin (CRESTOR) 10 MG tablet Take 10 mg by mouth daily.    Yes Historical Provider, MD  tiotropium (SPIRIVA) 18 MCG inhalation capsule Place 18 mcg into inhaler and inhale daily.     Yes Historical Provider, MD  traMADol (ULTRAM) 50 MG tablet Take 1 tablet (50 mg total) by mouth every 6 (six) hours as needed. 12/22/13  Yes Mellody Drown, PA-C  alendronate (FOSAMAX) 70 MG tablet Take 70 mg by mouth every 7 (seven) days. Every Monday.Marland KitchenMarland KitchenMarland KitchenTake in the morning with a full glass of water, on an empty stomach, and do not take anything else by mouth or lie down  for the next 30 min.    Historical Provider, MD  diclofenac (CATAFLAM) 50 MG tablet Take 1 tablet (50 mg total) by mouth 3 (three) times daily. For dental pain 11/23/14   Linna Hoff, MD  metroNIDAZOLE (FLAGYL) 500 MG tablet Take 1 tablet (500 mg total) by mouth 2 (two) times daily. 09/24/14   Peggy Constant, MD   BP 132/94 mmHg  Pulse 101  Temp(Src) 98.7 F (37.1 C) (Oral)  Resp 18  Ht  (1.676 m)  Wt 130 lb (58.968 kg)  BMI 20.99 kg/m2  SpO2 95% Physical Exam  Constitutional: She is oriented to person, place, and time. She appears well-developed and well-nourished. No distress.  HENT:  Head: Normocephalic and atraumatic.  Nose: Nose normal.  Mouth/Throat: Oropharynx is clear and moist. No oropharyngeal exudate.  Eyes: Conjunctivae and EOM are normal. Pupils are equal, round, and reactive to light. No scleral icterus.  Neck: Normal range of motion. Neck supple. No JVD present. No tracheal deviation present. No thyromegaly present.  Cardiovascular: Regular rhythm and normal heart sounds.  Exam reveals no gallop and no friction rub.   No murmur heard. Tachycardia  Pulmonary/Chest: Effort normal. No respiratory distress. She has wheezes. She exhibits no tenderness.  Abdominal: Soft. Bowel sounds are normal. She exhibits no distension and no mass. There is no tenderness. There is no rebound and no guarding.  Musculoskeletal: Normal range of motion. She exhibits no edema or tenderness.  Lymphadenopathy:    She has no cervical adenopathy.  Neurological: She is alert and oriented to person, place, and time. No cranial nerve deficit. She exhibits normal muscle tone.  Skin: Skin is warm and dry. No rash noted. No erythema. No pallor.  Nursing note and vitals reviewed.   ED Course  Procedures (including critical care time) Labs Review Labs Reviewed - No data to display  Imaging Review No results found. I have personally reviewed and evaluated these images and lab results as part of  my medical decision-making.   EKG Interpretation None      MDM   Final diagnoses:  None    Patient presents to the emergency department for mild asthma exacerbation. Physical exam reveals end expiratory wheezing. She was given a DuoNeb treatment in triage, I have ordered 2 more. She was also given 60 mg of prednisone. Albuterol inhaler was refilled for her. Upon repeat evaluation, her wheezing has much improved. Hematocrit was better. We'll discharge with 4 more days of prednisone a primary care follow-up. She appears well in acute distress. She has been tachycardic throughout her emergency department stay, likely due to her albuterol use at home and in triage. Her vital signs otherwise remain within  her normal limits and she is safe for discharge.   Tomasita Crumble, MD 01/30/15 [id number]

## 2015-01-30 NOTE — ED Notes (Signed)
MD at bedside. 

## 2015-01-30 NOTE — ED Notes (Signed)
Pt has asthma and started having hay fever a week ago and it has just been getting worse. Has tried using her inhaler tonight but it hasn't helped.

## 2015-01-30 NOTE — Discharge Instructions (Signed)
Asthma Attack Prevention  Ms. Manard, continue to use your albuterol inhaler every 6 hours for the next 2 days. After that, you can use it as needed for wheezing. Take prednisone for 4 more days. See a primary care physician within 3 days for close follow-up. If symptoms worsen come back to emergency department immediately. Thank you.   Although there is no way to prevent asthma from starting, you can take steps to control the disease and reduce its symptoms. Learn about your asthma and how to control it. Take an active role to control your asthma by working with your health care provider to create and follow an asthma action plan. An asthma action plan guides you in:  Taking your medicines properly.  Avoiding things that set off your asthma or make your asthma worse (asthma triggers).  Tracking your level of asthma control.  Responding to worsening asthma.  Seeking emergency care when needed. To track your asthma, keep records of your symptoms, check your peak flow number using a handheld device that shows how well air moves out of your lungs (peak flow meter), and get regular asthma checkups.  WHAT ARE SOME WAYS TO PREVENT AN ASTHMA ATTACK?  Take medicines as directed by your health care provider.  Keep track of your asthma symptoms and level of control.  With your health care provider, write a detailed plan for taking medicines and managing an asthma attack. Then be sure to follow your action plan. Asthma is an ongoing condition that needs regular monitoring and treatment.  Identify and avoid asthma triggers. Many outdoor allergens and irritants (such as pollen, mold, cold air, and air pollution) can trigger asthma attacks. Find out what your asthma triggers are and take steps to avoid them.  Monitor your breathing. Learn to recognize warning signs of an attack, such as coughing, wheezing, or shortness of breath. Your lung function may decrease before you notice any signs or  symptoms, so regularly measure and record your peak airflow with a home peak flow meter.  Identify and treat attacks early. If you act quickly, you are less likely to have a severe attack. You will also need less medicine to control your symptoms. When your peak flow measurements decrease and alert you to an upcoming attack, take your medicine as instructed and immediately stop any activity that may have triggered the attack. If your symptoms do not improve, get medical help.  Pay attention to increasing quick-relief inhaler use. If you find yourself relying on your quick-relief inhaler, your asthma is not under control. See your health care provider about adjusting your treatment. WHAT CAN MAKE MY SYMPTOMS WORSE? A number of common things can set off or make your asthma symptoms worse and cause temporary increased inflammation of your airways. Keep track of your asthma symptoms for several weeks, detailing all the environmental and emotional factors that are linked with your asthma. When you have an asthma attack, go back to your asthma diary to see which factor, or combination of factors, might have contributed to it. Once you know what these factors are, you can take steps to control many of them. If you have allergies and asthma, it is important to take asthma prevention steps at home. Minimizing contact with the substance to which you are allergic will help prevent an asthma attack. Some triggers and ways to avoid these triggers are: Animal Dander:  Some people are allergic to the flakes of skin or dried saliva from animals with fur or feathers.  There is no such thing as a hypoallergenic dog or cat breed. All dogs or cats can cause allergies, even if they don't shed.  Keep these pets out of your home.  If you are not able to keep a pet outdoors, keep the pet out of your bedroom and other sleeping areas at all times, and keep the door closed.  Remove carpets and furniture covered with cloth  from your home. If that is not possible, keep the pet away from fabric-covered furniture and carpets. Dust Mites: Many people with asthma are allergic to dust mites. Dust mites are tiny bugs that are found in every home in mattresses, pillows, carpets, fabric-covered furniture, bedcovers, clothes, stuffed toys, and other fabric-covered items.   Cover your mattress in a special dust-proof cover.  Cover your pillow in a special dust-proof cover, or wash the pillow each week in hot water. Water must be hotter than 130 F (54.4 C) to kill dust mites. Cold or warm water used with detergent and bleach can also be effective.  Wash the sheets and blankets on your bed each week in hot water.  Try not to sleep or lie on cloth-covered cushions.  Call ahead when traveling and ask for a smoke-free hotel room. Bring your own bedding and pillows in case the hotel only supplies feather pillows and down comforters, which may contain dust mites and cause asthma symptoms.  Remove carpets from your bedroom and those laid on concrete, if you can.  Keep stuffed toys out of the bed, or wash the toys weekly in hot water or cooler water with detergent and bleach. Cockroaches: Many people with asthma are allergic to the droppings and remains of cockroaches.   Keep food and garbage in closed containers. Never leave food out.  Use poison baits, traps, powders, gels, or paste (for example, boric acid).  If a spray is used to kill cockroaches, stay out of the room until the odor goes away. Indoor Mold:  Fix leaky faucets, pipes, or other sources of water that have mold around them.  Clean floors and moldy surfaces with a fungicide or diluted bleach.  Avoid using humidifiers, vaporizers, or swamp coolers. These can spread molds through the air. Pollen and Outdoor Mold:  When pollen or mold spore counts are high, try to keep your windows closed.  Stay indoors with windows closed from late morning to afternoon.  Pollen and some mold spore counts are highest at that time.  Ask your health care provider whether you need to take anti-inflammatory medicine or increase your dose of the medicine before your allergy season starts. Other Irritants to Avoid:  Tobacco smoke is an irritant. If you smoke, ask your health care provider how you can quit. Ask family members to quit smoking, too. Do not allow smoking in your home or car.  If possible, do not use a wood-burning stove, kerosene heater, or fireplace. Minimize exposure to all sources of smoke, including incense, candles, fires, and fireworks.  Try to stay away from strong odors and sprays, such as perfume, talcum powder, hair spray, and paints.  Decrease humidity in your home and use an indoor air cleaning device. Reduce indoor humidity to below 60%. Dehumidifiers or central air conditioners can do this.  Decrease house dust exposure by changing furnace and air cooler filters frequently.  Try to have someone else vacuum for you once or twice a week. Stay out of rooms while they are being vacuumed and for a short while afterward.  If you vacuum, use a dust mask from a hardware store, a double-layered or microfilter vacuum cleaner bag, or a vacuum cleaner with a HEPA filter.  Sulfites in foods and beverages can be irritants. Do not drink beer or wine or eat dried fruit, processed potatoes, or shrimp if they cause asthma symptoms.  Cold air can trigger an asthma attack. Cover your nose and mouth with a scarf on cold or windy days.  Several health conditions can make asthma more difficult to manage, including a runny nose, sinus infections, reflux disease, psychological stress, and sleep apnea. Work with your health care provider to manage these conditions.  Avoid close contact with people who have a respiratory infection such as a cold or the flu, since your asthma symptoms may get worse if you catch the infection. Wash your hands thoroughly after  touching items that may have been handled by people with a respiratory infection.  Get a flu shot every year to protect against the flu virus, which often makes asthma worse for days or weeks. Also get a pneumonia shot if you have not previously had one. Unlike the flu shot, the pneumonia shot does not need to be given yearly. Medicines:  Talk to your health care provider about whether it is safe for you to take aspirin or non-steroidal anti-inflammatory medicines (NSAIDs). In a small number of people with asthma, aspirin and NSAIDs can cause asthma attacks. These medicines must be avoided by people who have known aspirin-sensitive asthma. It is important that people with aspirin-sensitive asthma read labels of all over-the-counter medicines used to treat pain, colds, coughs, and fever.  Beta-blockers and ACE inhibitors are other medicines you should discuss with your health care provider. HOW CAN I FIND OUT WHAT I AM ALLERGIC TO? Ask your asthma health care provider about allergy skin testing or blood testing (the RAST test) to identify the allergens to which you are sensitive. If you are found to have allergies, the most important thing to do is to try to avoid exposure to any allergens that you are sensitive to as much as possible. Other treatments for allergies, such as medicines and allergy shots (immunotherapy) are available.  CAN I EXERCISE? Follow your health care provider's advice regarding asthma treatment before exercising. It is important to maintain a regular exercise program, but vigorous exercise or exercise in cold, humid, or dry environments can cause asthma attacks, especially for those people who have exercise-induced asthma. Document Released: 04/22/2009 Document Revised: 05/09/2013 Document Reviewed: 11/09/2012 [facility] Patient Information 2015 [place], [place]. This information is not intended to replace advice given to you by your health care provider. Make sure you discuss any  questions you have with your health care provider.

## 2015-01-30 NOTE — ED Notes (Signed)
Patient stated she thinks her asthma has been getting worse due to hay fever.  States she feels better since her breathing treatment but still has wheezing

## 2015-04-24 ENCOUNTER — Telehealth: Payer: Self-pay | Admitting: General Practice

## 2015-04-24 DIAGNOSIS — B379 Candidiasis, unspecified: Secondary | ICD-10-CM

## 2015-04-24 MED ORDER — FLUCONAZOLE 150 MG PO TABS: 150.0000 mg | ORAL_TABLET | Freq: Once | ORAL | Status: DC

## 2015-04-24 NOTE — Telephone Encounter (Signed)
Patient called and left message stating the doctor gave her a prescription for yeast infection but she is now out and needs a refill.  Called patient and she states she is having vaginal itching and would like something sent in because she knows this is a yeast infection. Patient states she doesn't know if her soap is bothering her or not. Recommended she use dove unscented soap & informed her of Rx sent to pharmacy. Patient verbalized understanding & had no other questions

## 2015-05-16 ENCOUNTER — Other Ambulatory Visit: Payer: Self-pay | Admitting: Obstetrics and Gynecology

## 2015-08-13 ENCOUNTER — Other Ambulatory Visit: Payer: Self-pay | Admitting: Obstetrics and Gynecology

## 2015-09-12 ENCOUNTER — Other Ambulatory Visit: Payer: Self-pay | Admitting: Obstetrics and Gynecology

## 2015-09-25 ENCOUNTER — Other Ambulatory Visit: Payer: Self-pay

## 2015-09-25 DIAGNOSIS — Z1231 Encounter for screening mammogram for malignant neoplasm of breast: Secondary | ICD-10-CM

## 2015-10-09 ENCOUNTER — Ambulatory Visit: Payer: Medicare Other

## 2015-10-23 ENCOUNTER — Ambulatory Visit
Admission: RE | Admit: 2015-10-23 | Discharge: 2015-10-23 | Disposition: A | Discharge status: Home / Self Care | Payer: Medicare Other | Source: Ambulatory Visit

## 2015-10-23 DIAGNOSIS — Z1231 Encounter for screening mammogram for malignant neoplasm of breast: Secondary | ICD-10-CM

## 2015-10-28 ENCOUNTER — Other Ambulatory Visit: Payer: Self-pay | Admitting: Obstetrics and Gynecology

## 2015-11-08 ENCOUNTER — Encounter (HOSPITAL_COMMUNITY): Payer: Self-pay

## 2015-11-08 ENCOUNTER — Emergency Department (HOSPITAL_COMMUNITY): Payer: No Typology Code available for payment source

## 2015-11-08 ENCOUNTER — Emergency Department (HOSPITAL_COMMUNITY)
Admission: EM | Admit: 2015-11-08 | Discharge: 2015-11-08 | Disposition: A | Discharge status: Home / Self Care | Payer: No Typology Code available for payment source | Attending: Emergency Medicine | Admitting: Emergency Medicine

## 2015-11-08 DIAGNOSIS — Y999 Unspecified external cause status: Secondary | ICD-10-CM | POA: Diagnosis not present

## 2015-11-08 DIAGNOSIS — M25511 Pain in right shoulder: Secondary | ICD-10-CM | POA: Diagnosis not present

## 2015-11-08 DIAGNOSIS — M545 Low back pain, unspecified: Secondary | ICD-10-CM

## 2015-11-08 DIAGNOSIS — Y92410 Unspecified street and highway as the place of occurrence of the external cause: Secondary | ICD-10-CM | POA: Diagnosis not present

## 2015-11-08 DIAGNOSIS — M542 Cervicalgia: Secondary | ICD-10-CM | POA: Diagnosis present

## 2015-11-08 DIAGNOSIS — J45909 Unspecified asthma, uncomplicated: Secondary | ICD-10-CM | POA: Diagnosis not present

## 2015-11-08 DIAGNOSIS — F17210 Nicotine dependence, cigarettes, uncomplicated: Secondary | ICD-10-CM | POA: Insufficient documentation

## 2015-11-08 DIAGNOSIS — I10 Essential (primary) hypertension: Secondary | ICD-10-CM | POA: Diagnosis not present

## 2015-11-08 DIAGNOSIS — Y939 Activity, unspecified: Secondary | ICD-10-CM | POA: Diagnosis not present

## 2015-11-08 MED ORDER — ACETAMINOPHEN 500 MG PO TABS: 500.0000 mg | ORAL_TABLET | Freq: Four times a day (QID) | ORAL | Status: DC | PRN

## 2015-11-08 NOTE — ED Notes (Signed)
Patient Alert and oriented X4. Stable and ambulatory. Patient verbalized understanding of the discharge instructions.  Patient belongings were taken by the patient.  

## 2015-11-08 NOTE — Discharge Instructions (Signed)
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
## 2015-11-13 ENCOUNTER — Emergency Department (HOSPITAL_COMMUNITY): Payer: Medicare Other

## 2015-11-13 ENCOUNTER — Encounter (HOSPITAL_COMMUNITY): Payer: Self-pay

## 2015-11-13 ENCOUNTER — Emergency Department (HOSPITAL_COMMUNITY)
Admission: EM | Admit: 2015-11-13 | Discharge: 2015-11-13 | Disposition: A | Discharge status: Home / Self Care | Payer: Medicare Other | Attending: Emergency Medicine | Admitting: Emergency Medicine

## 2015-11-13 DIAGNOSIS — I10 Essential (primary) hypertension: Secondary | ICD-10-CM | POA: Diagnosis not present

## 2015-11-13 DIAGNOSIS — R109 Unspecified abdominal pain: Secondary | ICD-10-CM | POA: Insufficient documentation

## 2015-11-13 DIAGNOSIS — Y92410 Unspecified street and highway as the place of occurrence of the external cause: Secondary | ICD-10-CM | POA: Insufficient documentation

## 2015-11-13 DIAGNOSIS — Z7982 Long term (current) use of aspirin: Secondary | ICD-10-CM | POA: Diagnosis not present

## 2015-11-13 DIAGNOSIS — E785 Hyperlipidemia, unspecified: Secondary | ICD-10-CM | POA: Insufficient documentation

## 2015-11-13 DIAGNOSIS — J45909 Unspecified asthma, uncomplicated: Secondary | ICD-10-CM | POA: Insufficient documentation

## 2015-11-13 DIAGNOSIS — Y999 Unspecified external cause status: Secondary | ICD-10-CM | POA: Insufficient documentation

## 2015-11-13 DIAGNOSIS — Y9389 Activity, other specified: Secondary | ICD-10-CM | POA: Insufficient documentation

## 2015-11-13 DIAGNOSIS — R51 Headache: Secondary | ICD-10-CM | POA: Insufficient documentation

## 2015-11-13 DIAGNOSIS — Z79899 Other long term (current) drug therapy: Secondary | ICD-10-CM | POA: Insufficient documentation

## 2015-11-13 DIAGNOSIS — S139XXA Sprain of joints and ligaments of unspecified parts of neck, initial encounter: Secondary | ICD-10-CM | POA: Diagnosis not present

## 2015-11-13 DIAGNOSIS — F17210 Nicotine dependence, cigarettes, uncomplicated: Secondary | ICD-10-CM | POA: Insufficient documentation

## 2015-11-13 DIAGNOSIS — M25511 Pain in right shoulder: Secondary | ICD-10-CM

## 2015-11-13 DIAGNOSIS — M5417 Radiculopathy, lumbosacral region: Secondary | ICD-10-CM

## 2015-11-13 DIAGNOSIS — M542 Cervicalgia: Secondary | ICD-10-CM | POA: Diagnosis present

## 2015-11-13 LAB — RAPID URINE DRUG SCREEN, HOSP PERFORMED
AMPHETAMINES: NOT DETECTED
BARBITURATES: NOT DETECTED
Benzodiazepines: NOT DETECTED
Cocaine: NOT DETECTED
OPIATES: NOT DETECTED
TETRAHYDROCANNABINOL: NOT DETECTED

## 2015-11-13 LAB — CBC
HCT: 42.9 % (ref 36.0–46.0)
HEMOGLOBIN: 14.9 g/dL (ref 12.0–15.0)
MCH: 31.3 pg (ref 26.0–34.0)
MCHC: 34.7 g/dL (ref 30.0–36.0)
MCV: 90.1 fL (ref 78.0–100.0)
PLATELETS: 216 10*3/uL (ref 150–400)
RBC: 4.76 MIL/uL (ref 3.87–5.11)
RDW: 14 % (ref 11.5–15.5)
WBC: 8.7 10*3/uL (ref 4.0–10.5)

## 2015-11-13 LAB — URINALYSIS, ROUTINE W REFLEX MICROSCOPIC
BILIRUBIN URINE: NEGATIVE
GLUCOSE, UA: NEGATIVE mg/dL
HGB URINE DIPSTICK: NEGATIVE
Ketones, ur: NEGATIVE mg/dL
Nitrite: NEGATIVE
Protein, ur: NEGATIVE mg/dL
SPECIFIC GRAVITY, URINE: 1.018 (ref 1.005–1.030)
pH: 5 (ref 5.0–8.0)

## 2015-11-13 LAB — I-STAT CHEM 8, ED
BUN: 25 mg/dL — AB (ref 6–20)
CHLORIDE: 117 mmol/L — AB (ref 101–111)
Calcium, Ion: 1.26 mmol/L — ABNORMAL HIGH (ref 1.12–1.23)
Creatinine, Ser: 1.5 mg/dL — ABNORMAL HIGH (ref 0.44–1.00)
Glucose, Bld: 82 mg/dL (ref 65–99)
HEMATOCRIT: 46 % (ref 36.0–46.0)
Hemoglobin: 15.6 g/dL — ABNORMAL HIGH (ref 12.0–15.0)
POTASSIUM: 4.1 mmol/L (ref 3.5–5.1)
SODIUM: 148 mmol/L — AB (ref 135–145)
TCO2: 22 mmol/L (ref 0–100)

## 2015-11-13 LAB — URINE MICROSCOPIC-ADD ON

## 2015-11-13 MED ORDER — METHOCARBAMOL 500 MG PO TABS: 500.0000 mg | ORAL_TABLET | Freq: Two times a day (BID) | ORAL | Status: DC

## 2015-11-13 MED ORDER — HYDROCODONE-ACETAMINOPHEN 5-325 MG PO TABS: 1.0000 | ORAL_TABLET | Freq: Four times a day (QID) | ORAL | Status: DC | PRN

## 2015-11-13 MED ORDER — OXYCODONE-ACETAMINOPHEN 5-325 MG PO TABS
1.0000 | ORAL_TABLET | Freq: Once | ORAL | Status: AC
  Administered 2015-11-13: 1 via ORAL
  Filled 2015-11-13: qty 1

## 2015-11-13 MED ORDER — NAPROXEN 500 MG PO TABS: 500.0000 mg | ORAL_TABLET | Freq: Two times a day (BID) | ORAL | Status: DC

## 2015-11-13 NOTE — ED Notes (Signed)
PT DISCHARGED. INSTRUCTIONS AND PRESCRIPTIONS GIVEN. AAOX4. PT IN NO APPARENT DISTRESS. THE OPPORTUNITY TO ASK QUESTIONS WAS PROVIDED. 

## 2015-11-13 NOTE — ED Notes (Signed)
Bed: WA20 Expected date:  Expected time:  Means of arrival:  Comments: 

## 2015-11-13 NOTE — Discharge Instructions (Signed)
Naprosyn for pain and inflammation. norco for severe pain. Robaxin for spasms. Try heating pads. Follow up with neurosurgery for further evaluation of your back and leg pain.   Lumbosacral Radiculopathy Lumbosacral radiculopathy is a condition that involves the spinal nerves and nerve roots in the low back and bottom of the spine. The condition develops when these nerves and nerve roots move out of place or become inflamed and cause symptoms. CAUSES This condition may be caused by:  Pressure from a disk that bulges out of place (herniated disk). A disk is a plate of cartilage that separates bones in the spine.  Disk degeneration.  A narrowing of the bones of the lower back (spinal stenosis).  A tumor.  An infection.  An injury that places sudden pressure on the disks that cushion the bones of your lower spine. RISK FACTORS This condition is more likely to develop in:  Males aged 30-50 years.  Females aged 50-60 years.  People who lift improperly.  People who are overweight or live a sedentary lifestyle.  People who smoke.  People who perform repetitive activities that strain the spine. SYMPTOMS Symptoms of this condition include:  Pain that goes down from the back into the legs (sciatica). This is the most common symptom. The pain may be worse with sitting, coughing, or sneezing.  Pain and numbness in the arms and legs.  Muscle weakness.  Tingling.  Loss of bladder control or bowel control. DIAGNOSIS This condition is diagnosed with a physical exam and medical history. If the pain is lasting, you may have tests, such as:  MRI scan.  X-ray.  CT scan.  Myelogram.  Nerve conduction study. TREATMENT This condition is often treated with:  Hot packs and ice applied to affected areas.  Stretches to improve flexibility.  Exercises to strengthen back muscles.  Physical therapy.  Pain medicine.  A steroid injection in the spine. In some cases, no treatment  is needed. If the condition is long-lasting (chronic), or if symptoms are severe, treatment may involve surgery or lifestyle changes, such as following a weight loss plan. HOME CARE INSTRUCTIONS Medicines  Take medicines only as directed by your health care provider.  Do not drive or operate heavy machinery while taking pain medicine. Injury Care  Apply a heat pack to the injured area as directed by your health care provider.  Apply ice to the affected area:  Put ice in a plastic bag.  Place a towel between your skin and the bag.  Leave the ice on for 20-30 minutes, every 2 hours while you are awake or as needed. Or, leave the ice on for as long as directed by your health care provider. Other Instructions  If you were shown how to do any exercises or stretches, do them as directed by your health care provider.  If your health care provider prescribed a diet or exercise program, follow it as directed.  Keep all follow-up visits as directed by your health care provider. This is important. SEEK MEDICAL CARE IF:  Your pain does not improve over time even when taking pain medicines. SEEK IMMEDIATE MEDICAL CARE IF:  Your develop severe pain.  Your pain suddenly gets worse.  You develop increasing weakness in your legs.  You lose the ability to control your bladder or bowel.  You have difficulty walking or balancing.  You have a fever.   This information is not intended to replace advice given to you by your health care provider. Make sure you  discuss any questions you have with your health care provider.   Document Released: 05/04/2005 Document Revised: 09/18/2014 Document Reviewed: 04/30/2014 Elsevier Interactive Patient Education [employer]2016 [employer] Inc.
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
## 2015-11-13 NOTE — ED Notes (Signed)
Patient transported to X-ray 

## 2015-11-29 ENCOUNTER — Other Ambulatory Visit: Payer: Self-pay | Admitting: Obstetrics and Gynecology

## 2015-12-20 ENCOUNTER — Other Ambulatory Visit: Payer: Self-pay | Admitting: Neurological Surgery

## 2015-12-27 ENCOUNTER — Encounter (HOSPITAL_COMMUNITY): Payer: Self-pay

## 2015-12-27 ENCOUNTER — Encounter (HOSPITAL_COMMUNITY)
Admission: RE | Admit: 2015-12-27 | Discharge: 2015-12-27 | Disposition: A | Discharge status: Home / Self Care | Payer: Medicare Other | Source: Ambulatory Visit | Attending: Neurological Surgery | Admitting: Neurological Surgery

## 2015-12-27 HISTORY — DX: Essential (primary) hypertension: I10

## 2015-12-27 HISTORY — DX: Reserved for inherently not codable concepts without codable children: IMO0001

## 2015-12-27 HISTORY — DX: Unspecified osteoarthritis, unspecified site: M19.90

## 2015-12-27 LAB — BASIC METABOLIC PANEL
Anion gap: 9 (ref 5–15)
BUN: 16 mg/dL (ref 6–20)
CALCIUM: 10.6 mg/dL — AB (ref 8.9–10.3)
CO2: 23 mmol/L (ref 22–32)
CREATININE: 1.42 mg/dL — AB (ref 0.44–1.00)
Chloride: 110 mmol/L (ref 101–111)
GFR calc Af Amer: 43 mL/min — ABNORMAL LOW (ref >60)
GFR, EST NON AFRICAN AMERICAN: 37 mL/min — AB (ref >60)
GLUCOSE: 57 mg/dL — AB (ref 65–99)
POTASSIUM: 4 mmol/L (ref 3.5–5.1)
SODIUM: 142 mmol/L (ref 135–145)

## 2015-12-27 LAB — CBC
HEMATOCRIT: 42.1 % (ref 36.0–46.0)
Hemoglobin: 13.7 g/dL (ref 12.0–15.0)
MCH: 31.2 pg (ref 26.0–34.0)
MCHC: 32.5 g/dL (ref 30.0–36.0)
MCV: 95.9 fL (ref 78.0–100.0)
PLATELETS: 183 10*3/uL (ref 150–400)
RBC: 4.39 MIL/uL (ref 3.87–5.11)
RDW: 14.6 % (ref 11.5–15.5)
WBC: 9.8 10*3/uL (ref 4.0–10.5)

## 2015-12-27 LAB — APTT: aPTT: 31 seconds (ref 24–36)

## 2015-12-27 LAB — SURGICAL PCR SCREEN
MRSA, PCR: NEGATIVE
STAPHYLOCOCCUS AUREUS: NEGATIVE

## 2015-12-27 LAB — TYPE AND SCREEN
ABO/RH(D): O POS
ANTIBODY SCREEN: NEGATIVE

## 2015-12-27 LAB — PROTIME-INR
INR: 0.98
Prothrombin Time: 13 seconds (ref 11.4–15.2)

## 2015-12-27 MED ORDER — CELECOXIB 200 MG PO CAPS
200.0000 mg | ORAL_CAPSULE | ORAL | Status: AC
  Administered 2015-12-30: 200 mg via ORAL
  Filled 2015-12-27: qty 1

## 2015-12-27 MED ORDER — DEXTROSE 5 % IV SOLN
750.0000 mg | INTRAVENOUS | Status: AC
  Administered 2015-12-30: 750 mg via INTRAVENOUS
  Filled 2015-12-27 (×2): qty 7.5

## 2015-12-27 MED ORDER — CEFAZOLIN SODIUM-DEXTROSE 2-4 GM/100ML-% IV SOLN
2.0000 g | INTRAVENOUS | Status: AC
  Administered 2015-12-30 (×2): 2 g via INTRAVENOUS
  Filled 2015-12-27: qty 100

## 2015-12-27 MED ORDER — ACETAMINOPHEN 10 MG/ML IV SOLN
1000.0000 mg | Freq: Once | INTRAVENOUS | Status: AC
  Administered 2015-12-30: 1000 mg via INTRAVENOUS

## 2015-12-27 MED ORDER — GABAPENTIN 300 MG PO CAPS
300.0000 mg | ORAL_CAPSULE | ORAL | Status: AC
  Administered 2015-12-30: 300 mg via ORAL
  Filled 2015-12-27: qty 1

## 2015-12-27 NOTE — Progress Notes (Signed)
PCP is Dr Sharyn LullHarwani Denies ever having a card cath, stress test, or echo. Informed her not to smoke on the day of surgery, voices understanding.

## 2015-12-27 NOTE — Pre-Procedure Instructions (Signed)
[name]  12/27/2015      [facility] - [place], [place] - [address] [name] [id number] Phone: [phone] Fax: [phone]   Your procedure is scheduled on Aug 14  [facility] at 530A.M.  Call this number if you have problems the morning of surgery:  [phone]   Remember:  Do not eat food or drink liquids after midnight.  Take these medicines the morning of surgery with A SIP OF WATER- Albuterol inhaler if needed, advair inhaler, Spiriva inhaler,  bring your inhalers with you on the day of surgery, Diltiazem (Cardizem), Hydrocodone (Norco) if needed  For pain, Methocarbamol (Robaxin) if needed, Montelukast (singulair), Tramadol if needed  Stop taking Aspirin, BC's, Goody's, Herbal medications, Fish Oil, Ibuprofen, Advil, Motrin, aleve, Vitamins, Naprosyn    Do not wear jewelry, make-up or nail polish.  Do not wear lotions, powders, or perfumes.  You may wear deoderant.  Do not shave 48 hours prior to surgery.  Men may shave face and neck.  Do not bring valuables to the hospital.  [facility] is not responsible for any belongings or valuables.  Contacts, dentures or bridgework may not be worn into surgery.  Leave your suitcase in the car.  After surgery it may be brought to your room.  For patients admitted to the hospital, discharge time will be determined by your treatment team.  Patients discharged the day of surgery will not be allowed to drive home.    Special instructions:  [place] - Preparing for Surgery  Before surgery, you can play an important role.  Because skin is not sterile, your skin needs to be as free of germs as possible.  You can reduce the number of germs on you skin by washing with CHG (chlorahexidine gluconate) soap before surgery.  CHG is an antiseptic cleaner which kills germs and bonds with the skin to continue killing germs even after washing.  Please  DO NOT use if you have an allergy to CHG or antibacterial soaps.  If your skin becomes reddened/irritated stop using the CHG and inform your nurse when you arrive at Short Stay.  Do not shave (including legs and underarms) for at least 48 hours prior to the first CHG shower.  You may shave your face.  Please follow these instructions carefully:   1.  Shower with CHG Soap the night before surgery and the    morning of Surgery.  2.  If you choose to wash your hair, wash your hair first as usual with your    normal shampoo.  3.  After you shampoo, rinse your hair and body thoroughly to remove the   Shampoo.  4.  Use CHG as you would any other liquid soap.  You can apply chg directly  to the skin and wash gently with scrungie or a clean washcloth.  5.  Apply the CHG Soap to your body ONLY FROM THE NECK DOWN.   Do not use on open wounds or open sores.  Avoid contact with your eyes,       ears, mouth and genitals (private parts).  Wash genitals (private parts)   with your normal soap.  6.  Wash thoroughly, paying special attention to the area where your surgery  will be performed.  7.  Thoroughly rinse your body with warm water from the neck down.  8.  DO NOT shower/wash with your normal soap after using and  rinsing off   the CHG Soap.  9.  Pat yourself dry with a clean towel.            10.  Wear clean pajamas.            11.  Place clean sheets on your bed the night of your first shower and do not   sleep with pets.  Day of Surgery  Do not apply any lotions/deoderants the morning of surgery.  Please wear clean clothes to the hospital/surgery center.     Please read over the following fact sheets that you were given. Pain Booklet, Coughing and Deep Breathing, Blood Transfusion Information and Surgical Site Infection Prevention

## 2015-12-29 ENCOUNTER — Encounter (HOSPITAL_COMMUNITY): Payer: Self-pay | Admitting: Certified Registered Nurse Anesthetist

## 2015-12-29 NOTE — Anesthesia Preprocedure Evaluation (Addendum)
Anesthesia Evaluation  Patient identified by MRN, date of birth, ID band Patient awake    Reviewed: Allergy & Precautions, NPO status , Patient's Chart, lab work & pertinent test results  History of Anesthesia Complications Negative for: history of anesthetic complications  Airway Mallampati: II  TM Distance: >3 FB Neck ROM: Full    Dental  (+) Dental Advisory Given, Partial Upper, Partial Lower, Missing   Pulmonary asthma , Current Smoker,    Pulmonary exam normal        Cardiovascular hypertension, Normal cardiovascular exam     Neuro/Psych negative psych ROS   GI/Hepatic negative GI ROS,   Endo/Other  negative endocrine ROS  Renal/GU Renal InsufficiencyRenal disease     Musculoskeletal   Abdominal   Peds  Hematology   Anesthesia Other Findings   Reproductive/Obstetrics                           Anesthesia Physical Anesthesia Plan  ASA: III  Anesthesia Plan: General   Post-op Pain Management:    Induction: Intravenous  Airway Management Planned: Oral ETT  Additional Equipment: Arterial line  Intra-op Plan:   Post-operative Plan: Extubation in OR  Informed Consent: I have reviewed the patients History and Physical, chart, labs and discussed the procedure including the risks, benefits and alternatives for the proposed anesthesia with the patient or authorized representative who has indicated his/her understanding and acceptance.   Dental advisory given  Plan Discussed with: CRNA and Anesthesiologist  Anesthesia Plan Comments:        Anesthesia Quick Evaluation

## 2015-12-30 ENCOUNTER — Inpatient Hospital Stay (HOSPITAL_COMMUNITY): Payer: Medicare Other | Admitting: Anesthesiology

## 2015-12-30 ENCOUNTER — Encounter (HOSPITAL_COMMUNITY)
Admission: RE | Disposition: A | Discharge status: Rehabilitation Facility | Payer: Self-pay | Source: Ambulatory Visit | Attending: Neurological Surgery

## 2015-12-30 ENCOUNTER — Encounter (HOSPITAL_COMMUNITY): Payer: Self-pay | Admitting: *Deleted

## 2015-12-30 ENCOUNTER — Inpatient Hospital Stay (HOSPITAL_COMMUNITY)
Admission: RE | Admit: 2015-12-30 | Discharge: 2016-01-02 | DRG: 453 | Disposition: A | Discharge status: Rehabilitation Facility | Payer: Medicare Other | Source: Ambulatory Visit | Attending: Neurological Surgery | Admitting: Neurological Surgery

## 2015-12-30 ENCOUNTER — Inpatient Hospital Stay (HOSPITAL_COMMUNITY): Payer: Medicare Other

## 2015-12-30 DIAGNOSIS — Z79899 Other long term (current) drug therapy: Secondary | ICD-10-CM | POA: Diagnosis not present

## 2015-12-30 DIAGNOSIS — K5900 Constipation, unspecified: Secondary | ICD-10-CM | POA: Diagnosis present

## 2015-12-30 DIAGNOSIS — D62 Acute posthemorrhagic anemia: Secondary | ICD-10-CM | POA: Diagnosis present

## 2015-12-30 DIAGNOSIS — T4275XA Adverse effect of unspecified antiepileptic and sedative-hypnotic drugs, initial encounter: Secondary | ICD-10-CM | POA: Diagnosis not present

## 2015-12-30 DIAGNOSIS — Z981 Arthrodesis status: Secondary | ICD-10-CM | POA: Diagnosis not present

## 2015-12-30 DIAGNOSIS — J9690 Respiratory failure, unspecified, unspecified whether with hypoxia or hypercapnia: Secondary | ICD-10-CM

## 2015-12-30 DIAGNOSIS — G92 Toxic encephalopathy: Secondary | ICD-10-CM | POA: Diagnosis not present

## 2015-12-30 DIAGNOSIS — G9611 Dural tear: Secondary | ICD-10-CM | POA: Diagnosis not present

## 2015-12-30 DIAGNOSIS — M4317 Spondylolisthesis, lumbosacral region: Secondary | ICD-10-CM | POA: Diagnosis present

## 2015-12-30 DIAGNOSIS — J449 Chronic obstructive pulmonary disease, unspecified: Secondary | ICD-10-CM | POA: Diagnosis present

## 2015-12-30 DIAGNOSIS — M549 Dorsalgia, unspecified: Secondary | ICD-10-CM

## 2015-12-30 DIAGNOSIS — N183 Chronic kidney disease, stage 3 (moderate): Secondary | ICD-10-CM | POA: Diagnosis present

## 2015-12-30 DIAGNOSIS — J9692 Respiratory failure, unspecified with hypercapnia: Secondary | ICD-10-CM | POA: Diagnosis not present

## 2015-12-30 DIAGNOSIS — G8929 Other chronic pain: Secondary | ICD-10-CM

## 2015-12-30 DIAGNOSIS — D696 Thrombocytopenia, unspecified: Secondary | ICD-10-CM

## 2015-12-30 DIAGNOSIS — G479 Sleep disorder, unspecified: Secondary | ICD-10-CM | POA: Diagnosis not present

## 2015-12-30 DIAGNOSIS — I959 Hypotension, unspecified: Secondary | ICD-10-CM | POA: Diagnosis present

## 2015-12-30 DIAGNOSIS — I129 Hypertensive chronic kidney disease with stage 1 through stage 4 chronic kidney disease, or unspecified chronic kidney disease: Secondary | ICD-10-CM | POA: Diagnosis present

## 2015-12-30 DIAGNOSIS — Z419 Encounter for procedure for purposes other than remedying health state, unspecified: Secondary | ICD-10-CM | POA: Diagnosis not present

## 2015-12-30 DIAGNOSIS — J95821 Acute postprocedural respiratory failure: Secondary | ICD-10-CM | POA: Diagnosis not present

## 2015-12-30 DIAGNOSIS — Y92239 Unspecified place in hospital as the place of occurrence of the external cause: Secondary | ICD-10-CM | POA: Diagnosis not present

## 2015-12-30 DIAGNOSIS — R000 Tachycardia, unspecified: Secondary | ICD-10-CM

## 2015-12-30 DIAGNOSIS — E785 Hyperlipidemia, unspecified: Secondary | ICD-10-CM | POA: Diagnosis present

## 2015-12-30 DIAGNOSIS — Z8249 Family history of ischemic heart disease and other diseases of the circulatory system: Secondary | ICD-10-CM

## 2015-12-30 DIAGNOSIS — N189 Chronic kidney disease, unspecified: Secondary | ICD-10-CM | POA: Diagnosis present

## 2015-12-30 DIAGNOSIS — M4727 Other spondylosis with radiculopathy, lumbosacral region: Secondary | ICD-10-CM | POA: Diagnosis present

## 2015-12-30 DIAGNOSIS — G8220 Paraplegia, unspecified: Secondary | ICD-10-CM | POA: Diagnosis present

## 2015-12-30 DIAGNOSIS — D72829 Elevated white blood cell count, unspecified: Secondary | ICD-10-CM

## 2015-12-30 DIAGNOSIS — F17210 Nicotine dependence, cigarettes, uncomplicated: Secondary | ICD-10-CM | POA: Diagnosis present

## 2015-12-30 DIAGNOSIS — I10 Essential (primary) hypertension: Secondary | ICD-10-CM | POA: Diagnosis not present

## 2015-12-30 DIAGNOSIS — N179 Acute kidney failure, unspecified: Secondary | ICD-10-CM | POA: Diagnosis present

## 2015-12-30 DIAGNOSIS — G9340 Encephalopathy, unspecified: Secondary | ICD-10-CM | POA: Diagnosis not present

## 2015-12-30 DIAGNOSIS — M792 Neuralgia and neuritis, unspecified: Secondary | ICD-10-CM | POA: Diagnosis not present

## 2015-12-30 DIAGNOSIS — M4316 Spondylolisthesis, lumbar region: Secondary | ICD-10-CM | POA: Diagnosis not present

## 2015-12-30 DIAGNOSIS — G8918 Other acute postprocedural pain: Secondary | ICD-10-CM | POA: Diagnosis not present

## 2015-12-30 DIAGNOSIS — G629 Polyneuropathy, unspecified: Secondary | ICD-10-CM | POA: Diagnosis present

## 2015-12-30 DIAGNOSIS — F411 Generalized anxiety disorder: Secondary | ICD-10-CM | POA: Diagnosis present

## 2015-12-30 DIAGNOSIS — Z91013 Allergy to seafood: Secondary | ICD-10-CM | POA: Diagnosis not present

## 2015-12-30 DIAGNOSIS — Z87891 Personal history of nicotine dependence: Secondary | ICD-10-CM | POA: Diagnosis not present

## 2015-12-30 HISTORY — PX: POSTERIOR LUMBAR FUSION 4 LEVEL: SHX6037

## 2015-12-30 HISTORY — PX: ANTERIOR LAT LUMBAR FUSION: SHX1168

## 2015-12-30 LAB — BLOOD GAS, ARTERIAL
ACID-BASE DEFICIT: 3.9 mmol/L — AB (ref 0.0–2.0)
Bicarbonate: 22.3 mEq/L (ref 20.0–24.0)
FIO2: 40
O2 SAT: 98.4 %
PATIENT TEMPERATURE: 98.6
PCO2 ART: 52.7 mmHg — AB (ref 35.0–45.0)
PEEP: 5 cmH2O
PH ART: 7.249 — AB (ref 7.350–7.450)
PO2 ART: 151 mmHg — AB (ref 80.0–100.0)
RATE: 22 resp/min
TCO2: 23.9 mmol/L (ref 0–100)
VT: 480 mL

## 2015-12-30 LAB — POCT I-STAT 3, ART BLOOD GAS (G3+)
Acid-base deficit: 3 mmol/L — ABNORMAL HIGH (ref 0.0–2.0)
BICARBONATE: 22.1 meq/L (ref 20.0–24.0)
O2 SAT: 98 %
PCO2 ART: 42.1 mmHg (ref 35.0–45.0)
PO2 ART: 111 mmHg — AB (ref 80.0–100.0)
Patient temperature: 99.5
TCO2: 23 mmol/L (ref 0–100)
pH, Arterial: 7.331 — ABNORMAL LOW (ref 7.350–7.450)

## 2015-12-30 SURGERY — ANTERIOR LATERAL LUMBAR FUSION 2 LEVELS
Anesthesia: General | Site: Flank | Laterality: Right

## 2015-12-30 MED ORDER — KETOROLAC TROMETHAMINE 30 MG/ML IJ SOLN
INTRAMUSCULAR | Status: AC
  Filled 2015-12-30: qty 1

## 2015-12-30 MED ORDER — ACETAMINOPHEN 500 MG PO TABS
1000.0000 mg | ORAL_TABLET | Freq: Four times a day (QID) | ORAL | Status: DC
  Administered 2015-12-31 – 2016-01-02 (×8): 1000 mg via ORAL
  Filled 2015-12-30 (×9): qty 2

## 2015-12-30 MED ORDER — HYDROMORPHONE HCL 1 MG/ML IJ SOLN: 0.2500 mg | INTRAMUSCULAR | Status: DC | PRN

## 2015-12-30 MED ORDER — PROMETHAZINE HCL 25 MG/ML IJ SOLN
6.2500 mg | INTRAMUSCULAR | Status: DC | PRN
  Administered 2015-12-30: 12.5 mg via INTRAVENOUS

## 2015-12-30 MED ORDER — LOSARTAN POTASSIUM 50 MG PO TABS
100.0000 mg | ORAL_TABLET | Freq: Every day | ORAL | Status: DC
Start: 2015-12-30 — End: 2016-01-02
  Administered 2015-12-31 – 2016-01-02 (×2): 100 mg via ORAL
  Filled 2015-12-30 (×3): qty 2

## 2015-12-30 MED ORDER — DEXAMETHASONE SODIUM PHOSPHATE 10 MG/ML IJ SOLN
INTRAMUSCULAR | Status: AC
  Filled 2015-12-30: qty 1

## 2015-12-30 MED ORDER — SODIUM CHLORIDE 0.9 % IV SOLN
INTRAVENOUS | Status: DC
Start: 2015-12-30 — End: 2016-01-02
  Administered 2015-12-31: 09:00:00 via INTRAVENOUS

## 2015-12-30 MED ORDER — SODIUM CHLORIDE 0.9% FLUSH
3.0000 mL | Freq: Two times a day (BID) | INTRAVENOUS | Status: DC
  Administered 2015-12-31 – 2016-01-01 (×4): 3 mL via INTRAVENOUS

## 2015-12-30 MED ORDER — MIDAZOLAM HCL 5 MG/5ML IJ SOLN
INTRAMUSCULAR | Status: DC | PRN
  Administered 2015-12-30 (×2): 2 mg via INTRAVENOUS

## 2015-12-30 MED ORDER — PHENYLEPHRINE HCL 10 MG/ML IJ SOLN
INTRAMUSCULAR | Status: AC
  Filled 2015-12-30: qty 1

## 2015-12-30 MED ORDER — ONDANSETRON HCL 4 MG/2ML IJ SOLN
INTRAMUSCULAR | Status: DC | PRN
  Administered 2015-12-30: 4 mg via INTRAVENOUS

## 2015-12-30 MED ORDER — SUGAMMADEX SODIUM 200 MG/2ML IV SOLN
INTRAVENOUS | Status: DC | PRN
  Administered 2015-12-30: 150 mg via INTRAVENOUS

## 2015-12-30 MED ORDER — CHLORHEXIDINE GLUCONATE CLOTH 2 % EX PADS: 6.0000 | MEDICATED_PAD | Freq: Once | CUTANEOUS | Status: DC

## 2015-12-30 MED ORDER — DILTIAZEM HCL ER COATED BEADS 240 MG PO CP24
240.0000 mg | ORAL_CAPSULE | Freq: Every day | ORAL | Status: DC
  Administered 2015-12-31 – 2016-01-02 (×2): 240 mg via ORAL
  Filled 2015-12-30 (×3): qty 1

## 2015-12-30 MED ORDER — GLYCOPYRROLATE 0.2 MG/ML IJ SOLN
INTRAMUSCULAR | Status: DC | PRN
  Administered 2015-12-30: 0.4 mg via INTRAVENOUS
  Administered 2015-12-30: 0.1 mg via INTRAVENOUS

## 2015-12-30 MED ORDER — PHENYLEPHRINE HCL 10 MG/ML IJ SOLN
INTRAMUSCULAR | Status: DC | PRN
  Administered 2015-12-30 (×2): 80 ug via INTRAVENOUS

## 2015-12-30 MED ORDER — DEXAMETHASONE SODIUM PHOSPHATE 4 MG/ML IJ SOLN
INTRAMUSCULAR | Status: DC | PRN
  Administered 2015-12-30: 10 mg via INTRAVENOUS

## 2015-12-30 MED ORDER — ALBUMIN HUMAN 5 % IV SOLN
INTRAVENOUS | Status: DC | PRN
  Administered 2015-12-30 (×2): via INTRAVENOUS

## 2015-12-30 MED ORDER — FLEET ENEMA 7-19 GM/118ML RE ENEM
1.0000 | ENEMA | Freq: Once | RECTAL | Status: AC | PRN
  Administered 2016-01-02: 1 via RECTAL
  Filled 2015-12-30: qty 1

## 2015-12-30 MED ORDER — FUROSEMIDE 20 MG PO TABS
20.0000 mg | ORAL_TABLET | Freq: Every day | ORAL | Status: DC
  Administered 2015-12-31 – 2016-01-02 (×2): 20 mg via ORAL
  Filled 2015-12-30 (×3): qty 1

## 2015-12-30 MED ORDER — LACTATED RINGERS IV SOLN
INTRAVENOUS | Status: DC | PRN
  Administered 2015-12-30 (×3): via INTRAVENOUS

## 2015-12-30 MED ORDER — PROPOFOL 10 MG/ML IV BOLUS
INTRAVENOUS | Status: AC
  Filled 2015-12-30: qty 20

## 2015-12-30 MED ORDER — 0.9 % SODIUM CHLORIDE (POUR BTL) OPTIME
TOPICAL | Status: DC | PRN
  Administered 2015-12-30: 1000 mL

## 2015-12-30 MED ORDER — SODIUM CHLORIDE 0.9 % IV SOLN: 250.0000 mL | INTRAVENOUS | Status: DC

## 2015-12-30 MED ORDER — GLYCOPYRROLATE 0.2 MG/ML IV SOSY
PREFILLED_SYRINGE | INTRAVENOUS | Status: AC
  Filled 2015-12-30: qty 3

## 2015-12-30 MED ORDER — MIDAZOLAM HCL 2 MG/2ML IJ SOLN
INTRAMUSCULAR | Status: AC
  Filled 2015-12-30: qty 2

## 2015-12-30 MED ORDER — PROPOFOL 1000 MG/100ML IV EMUL
INTRAVENOUS | Status: AC
  Filled 2015-12-30: qty 200

## 2015-12-30 MED ORDER — PANTOPRAZOLE SODIUM 20 MG PO TBEC
20.0000 mg | DELAYED_RELEASE_TABLET | Freq: Every day | ORAL | Status: DC
Start: 2015-12-30 — End: 2016-01-02
  Administered 2015-12-31 – 2016-01-02 (×3): 20 mg via ORAL
  Filled 2015-12-30 (×4): qty 1

## 2015-12-30 MED ORDER — SENNA 8.6 MG PO TABS
1.0000 | ORAL_TABLET | Freq: Two times a day (BID) | ORAL | Status: DC
  Administered 2015-12-31 – 2016-01-02 (×5): 8.6 mg via ORAL
  Filled 2015-12-30 (×5): qty 1

## 2015-12-30 MED ORDER — THROMBIN 5000 UNITS EX SOLR
OROMUCOSAL | Status: DC | PRN
  Administered 2015-12-30 (×3): 5 mL via TOPICAL

## 2015-12-30 MED ORDER — FLUMAZENIL 0.5 MG/5ML IV SOLN
INTRAVENOUS | Status: AC
  Filled 2015-12-30: qty 5

## 2015-12-30 MED ORDER — SODIUM CHLORIDE 0.9% FLUSH: 3.0000 mL | INTRAVENOUS | Status: DC | PRN

## 2015-12-30 MED ORDER — BUPIVACAINE LIPOSOME 1.3 % IJ SUSP
20.0000 mL | INTRAMUSCULAR | Status: AC
  Filled 2015-12-30: qty 20

## 2015-12-30 MED ORDER — BUPIVACAINE-EPINEPHRINE 0.5% -1:200000 IJ SOLN
INTRAMUSCULAR | Status: DC | PRN
  Administered 2015-12-30: 5 mL
  Administered 2015-12-30: 15 mL

## 2015-12-30 MED ORDER — PHENOL 1.4 % MT LIQD: 1.0000 | OROMUCOSAL | Status: DC | PRN

## 2015-12-30 MED ORDER — TIOTROPIUM BROMIDE MONOHYDRATE 18 MCG IN CAPS
18.0000 ug | ORAL_CAPSULE | Freq: Every day | RESPIRATORY_TRACT | Status: DC
  Administered 2015-12-31 – 2016-01-02 (×3): 18 ug via RESPIRATORY_TRACT
  Filled 2015-12-30: qty 5

## 2015-12-30 MED ORDER — OXYCODONE HCL 5 MG PO TABS
5.0000 mg | ORAL_TABLET | ORAL | Status: DC | PRN
  Administered 2015-12-31: 5 mg via ORAL
  Administered 2015-12-31 (×2): 10 mg via ORAL
  Administered 2016-01-01: 5 mg via ORAL
  Administered 2016-01-02: 10 mg via ORAL
  Administered 2016-01-02: 5 mg via ORAL
  Administered 2016-01-02: 10 mg via ORAL
  Filled 2015-12-30 (×2): qty 1
  Filled 2015-12-30 (×3): qty 2
  Filled 2015-12-30: qty 1
  Filled 2015-12-30: qty 2

## 2015-12-30 MED ORDER — LIDOCAINE-EPINEPHRINE 2 %-1:100000 IJ SOLN
INTRAMUSCULAR | Status: DC | PRN
  Administered 2015-12-30: 15 mL

## 2015-12-30 MED ORDER — LIDOCAINE-EPINEPHRINE 1 %-1:100000 IJ SOLN
INTRAMUSCULAR | Status: DC | PRN
  Administered 2015-12-30: 10 mL

## 2015-12-30 MED ORDER — FLUMAZENIL 0.5 MG/5ML IV SOLN
INTRAVENOUS | Status: DC | PRN
  Administered 2015-12-30 (×2): 0.5 mg via INTRAVENOUS

## 2015-12-30 MED ORDER — ZOLPIDEM TARTRATE 5 MG PO TABS: 5.0000 mg | ORAL_TABLET | Freq: Every evening | ORAL | Status: DC | PRN

## 2015-12-30 MED ORDER — PROMETHAZINE HCL 25 MG/ML IJ SOLN: 6.2500 mg | INTRAMUSCULAR | Status: DC | PRN

## 2015-12-30 MED ORDER — ALBUTEROL SULFATE (2.5 MG/3ML) 0.083% IN NEBU: 2.5000 mg | INHALATION_SOLUTION | Freq: Four times a day (QID) | RESPIRATORY_TRACT | Status: DC | PRN

## 2015-12-30 MED ORDER — ONDANSETRON HCL 4 MG/2ML IJ SOLN
4.0000 mg | INTRAMUSCULAR | Status: DC | PRN
  Administered 2015-12-31 (×2): 4 mg via INTRAVENOUS
  Filled 2015-12-30 (×2): qty 2

## 2015-12-30 MED ORDER — SODIUM CHLORIDE 0.9 % IJ SOLN
INTRAMUSCULAR | Status: AC
  Filled 2015-12-30: qty 10

## 2015-12-30 MED ORDER — GABAPENTIN 300 MG PO CAPS
300.0000 mg | ORAL_CAPSULE | Freq: Three times a day (TID) | ORAL | Status: DC
  Administered 2015-12-31 – 2016-01-02 (×8): 300 mg via ORAL
  Filled 2015-12-30 (×8): qty 1

## 2015-12-30 MED ORDER — PHENYLEPHRINE HCL 10 MG/ML IJ SOLN
INTRAVENOUS | Status: DC | PRN
  Administered 2015-12-30: 40 ug/min via INTRAVENOUS

## 2015-12-30 MED ORDER — ROCURONIUM BROMIDE 100 MG/10ML IV SOLN
INTRAVENOUS | Status: DC | PRN
  Administered 2015-12-30 (×2): 50 mg via INTRAVENOUS

## 2015-12-30 MED ORDER — VITAMIN D 1000 UNITS PO TABS
1000.0000 [IU] | ORAL_TABLET | Freq: Every day | ORAL | Status: DC
  Administered 2015-12-31 – 2016-01-02 (×3): 1000 [IU] via ORAL
  Filled 2015-12-30 (×3): qty 1

## 2015-12-30 MED ORDER — ROSUVASTATIN CALCIUM 5 MG PO TABS
10.0000 mg | ORAL_TABLET | Freq: Every day | ORAL | Status: DC
  Administered 2015-12-31 – 2016-01-01 (×2): 10 mg via ORAL
  Filled 2015-12-30: qty 2
  Filled 2015-12-30 (×2): qty 1
  Filled 2015-12-30: qty 2

## 2015-12-30 MED ORDER — THROMBIN 5000 UNITS EX SOLR
CUTANEOUS | Status: DC | PRN
  Administered 2015-12-30 (×2): 5000 [IU] via TOPICAL

## 2015-12-30 MED ORDER — LACTATED RINGERS IV SOLN
INTRAVENOUS | Status: DC | PRN
  Administered 2015-12-30 (×2): via INTRAVENOUS

## 2015-12-30 MED ORDER — PROMETHAZINE HCL 25 MG/ML IJ SOLN
INTRAMUSCULAR | Status: AC
  Filled 2015-12-30: qty 1

## 2015-12-30 MED ORDER — KETAMINE HCL 10 MG/ML IJ SOLN
0.5000 mg/kg/h | INTRAMUSCULAR | Status: DC
  Administered 2015-12-30: .6 mg/kg/h via INTRAVENOUS
  Filled 2015-12-30: qty 2
  Filled 2015-12-30: qty 20

## 2015-12-30 MED ORDER — PROPOFOL 10 MG/ML IV BOLUS
INTRAVENOUS | Status: DC | PRN
  Administered 2015-12-30: 130 mg via INTRAVENOUS

## 2015-12-30 MED ORDER — SODIUM CHLORIDE 0.9 % IR SOLN
Status: DC | PRN
  Administered 2015-12-30: 500 mL

## 2015-12-30 MED ORDER — FENTANYL CITRATE (PF) 250 MCG/5ML IJ SOLN
INTRAMUSCULAR | Status: AC
  Filled 2015-12-30: qty 10

## 2015-12-30 MED ORDER — HYDROMORPHONE HCL 1 MG/ML IJ SOLN
INTRAMUSCULAR | Status: AC
  Filled 2015-12-30: qty 1

## 2015-12-30 MED ORDER — DOCUSATE SODIUM 100 MG PO CAPS
100.0000 mg | ORAL_CAPSULE | Freq: Two times a day (BID) | ORAL | Status: DC
  Administered 2015-12-31 – 2016-01-02 (×5): 100 mg via ORAL
  Filled 2015-12-30 (×5): qty 1

## 2015-12-30 MED ORDER — ALENDRONATE SODIUM 70 MG PO TABS: 70.0000 mg | ORAL_TABLET | ORAL | Status: DC

## 2015-12-30 MED ORDER — ACETAMINOPHEN 10 MG/ML IV SOLN
INTRAVENOUS | Status: AC
  Filled 2015-12-30: qty 100

## 2015-12-30 MED ORDER — VANCOMYCIN HCL 1000 MG IV SOLR
INTRAVENOUS | Status: AC
  Filled 2015-12-30: qty 1000

## 2015-12-30 MED ORDER — LIDOCAINE 2% (20 MG/ML) 5 ML SYRINGE
INTRAMUSCULAR | Status: AC
  Filled 2015-12-30: qty 5

## 2015-12-30 MED ORDER — HEMOSTATIC AGENTS (NO CHARGE) OPTIME
TOPICAL | Status: DC | PRN
  Administered 2015-12-30 (×2): 1 via TOPICAL

## 2015-12-30 MED ORDER — METHOCARBAMOL 750 MG PO TABS
750.0000 mg | ORAL_TABLET | Freq: Four times a day (QID) | ORAL | Status: DC
Start: 2015-12-30 — End: 2016-01-02
  Administered 2015-12-31 – 2016-01-02 (×10): 750 mg via ORAL
  Filled 2015-12-30: qty 1
  Filled 2015-12-30: qty 2
  Filled 2015-12-30 (×2): qty 1
  Filled 2015-12-30: qty 2
  Filled 2015-12-30 (×5): qty 1

## 2015-12-30 MED ORDER — NEOSTIGMINE METHYLSULFATE 5 MG/5ML IV SOSY
PREFILLED_SYRINGE | INTRAVENOUS | Status: AC
  Filled 2015-12-30: qty 5

## 2015-12-30 MED ORDER — MENTHOL 3 MG MT LOZG: 1.0000 | LOZENGE | OROMUCOSAL | Status: DC | PRN

## 2015-12-30 MED ORDER — FENTANYL CITRATE (PF) 250 MCG/5ML IJ SOLN
INTRAMUSCULAR | Status: DC | PRN
  Administered 2015-12-30: 50 ug via INTRAVENOUS
  Administered 2015-12-30: 100 ug via INTRAVENOUS
  Administered 2015-12-30: 150 ug via INTRAVENOUS
  Administered 2015-12-30 (×2): 100 ug via INTRAVENOUS

## 2015-12-30 MED ORDER — HYDROMORPHONE HCL 1 MG/ML IJ SOLN
INTRAMUSCULAR | Status: DC | PRN
  Administered 2015-12-30: .2 mg via INTRAVENOUS
  Administered 2015-12-30: .1 mg via INTRAVENOUS
  Administered 2015-12-30: .3 mg via INTRAVENOUS
  Administered 2015-12-30 (×2): .2 mg via INTRAVENOUS

## 2015-12-30 MED ORDER — EPHEDRINE SULFATE 50 MG/ML IJ SOLN
INTRAMUSCULAR | Status: AC
  Filled 2015-12-30: qty 1

## 2015-12-30 MED ORDER — CEFAZOLIN SODIUM 1 G IJ SOLR
INTRAMUSCULAR | Status: AC
  Filled 2015-12-30: qty 20

## 2015-12-30 MED ORDER — OXYCODONE HCL ER 10 MG PO T12A
20.0000 mg | EXTENDED_RELEASE_TABLET | Freq: Two times a day (BID) | ORAL | Status: DC
  Administered 2015-12-31 – 2016-01-02 (×5): 20 mg via ORAL
  Filled 2015-12-30: qty 1
  Filled 2015-12-30 (×4): qty 2

## 2015-12-30 MED ORDER — MONTELUKAST SODIUM 10 MG PO TABS
10.0000 mg | ORAL_TABLET | Freq: Every day | ORAL | Status: DC
  Administered 2015-12-31 – 2016-01-01 (×2): 10 mg via ORAL
  Filled 2015-12-30 (×3): qty 1

## 2015-12-30 MED ORDER — SODIUM CHLORIDE 0.9 % IV SOLN
INTRAVENOUS | Status: DC | PRN
  Administered 2015-12-30: 30 mL

## 2015-12-30 MED ORDER — BISACODYL 5 MG PO TBEC: 5.0000 mg | DELAYED_RELEASE_TABLET | Freq: Every day | ORAL | Status: DC | PRN

## 2015-12-30 MED ORDER — MOMETASONE FURO-FORMOTEROL FUM 200-5 MCG/ACT IN AERO
2.0000 | INHALATION_SPRAY | Freq: Two times a day (BID) | RESPIRATORY_TRACT | Status: DC
  Administered 2015-12-31 – 2016-01-01 (×2): 2 via RESPIRATORY_TRACT
  Filled 2015-12-30: qty 8.8

## 2015-12-30 MED ORDER — CELECOXIB 200 MG PO CAPS
200.0000 mg | ORAL_CAPSULE | Freq: Two times a day (BID) | ORAL | Status: DC
  Administered 2015-12-31 – 2016-01-02 (×5): 200 mg via ORAL
  Filled 2015-12-30 (×6): qty 1

## 2015-12-30 MED ORDER — HYDROMORPHONE HCL 1 MG/ML IJ SOLN
1.0000 mg | INTRAMUSCULAR | Status: DC | PRN
Start: 2015-12-30 — End: 2016-01-02
  Administered 2015-12-31: 1 mg via INTRAVENOUS
  Filled 2015-12-30: qty 1

## 2015-12-30 MED ORDER — NEOSTIGMINE METHYLSULFATE 10 MG/10ML IV SOLN
INTRAVENOUS | Status: DC | PRN
  Administered 2015-12-30: 3 mg via INTRAVENOUS
  Administered 2015-12-30: 1 mg via INTRAVENOUS

## 2015-12-30 MED ORDER — CEFAZOLIN IN D5W 1 GM/50ML IV SOLN
1.0000 g | Freq: Three times a day (TID) | INTRAVENOUS | Status: AC
  Administered 2015-12-30 – 2015-12-31 (×2): 1 g via INTRAVENOUS
  Filled 2015-12-30 (×2): qty 50

## 2015-12-30 MED ORDER — LIDOCAINE HCL (CARDIAC) 20 MG/ML IV SOLN
INTRAVENOUS | Status: DC | PRN
  Administered 2015-12-30: 80 mg via INTRAVENOUS

## 2015-12-30 MED ORDER — PROPOFOL 500 MG/50ML IV EMUL
INTRAVENOUS | Status: DC | PRN
  Administered 2015-12-30: 50 ug/kg/min via INTRAVENOUS

## 2015-12-30 SURGICAL SUPPLY — 114 items
ADH SKN CLS APL DERMABOND .7 (GAUZE/BANDAGES/DRESSINGS) ×4
APL SKNCLS STERI-STRIP NONHPOA (GAUZE/BANDAGES/DRESSINGS) ×2
BAG DECANTER FOR FLEXI CONT (MISCELLANEOUS) ×4 IMPLANT
BENZOIN TINCTURE PRP APPL 2/3 (GAUZE/BANDAGES/DRESSINGS) ×4 IMPLANT
BLADE CLIPPER SURG (BLADE) IMPLANT
BLADE SURG 11 STRL SS (BLADE) ×2 IMPLANT
BUR MATCHSTICK NEURO 3.0 LAGG (BURR) ×4 IMPLANT
BUR ROUND FLUTED 5 RND (BURR) ×3 IMPLANT
BUR ROUND FLUTED 5MM RND (BURR) ×1
CAGE COROENT XL 4X22X55 30D (Cage) ×1 IMPLANT
CANISTER SUCT 3000ML PPV (MISCELLANEOUS) ×6 IMPLANT
CHLORAPREP W/TINT 26ML (MISCELLANEOUS) ×8 IMPLANT
COROENT XL-W 10X22X50 (Orthopedic Implant) ×2 IMPLANT
COVER BACK TABLE 24X17X13 BIG (DRAPES) IMPLANT
DECANTER SPIKE VIAL GLASS SM (MISCELLANEOUS) ×10 IMPLANT
DERMABOND ADVANCED (GAUZE/BANDAGES/DRESSINGS) ×4
DERMABOND ADVANCED .7 DNX12 (GAUZE/BANDAGES/DRESSINGS) ×6 IMPLANT
DRAPE C-ARM 42X72 X-RAY (DRAPES) ×12 IMPLANT
DRAPE C-ARMOR (DRAPES) ×6 IMPLANT
DRAPE LAPAROTOMY 100X72X124 (DRAPES) ×2 IMPLANT
DRAPE MICROSCOPE LEICA (MISCELLANEOUS) IMPLANT
DRAPE POUCH INSTRU U-SHP 10X18 (DRAPES) ×8 IMPLANT
DRAPE SURG 17X23 STRL (DRAPES) ×6 IMPLANT
DRSG OPSITE POSTOP 4X6 (GAUZE/BANDAGES/DRESSINGS) ×2 IMPLANT
DRSG OPSITE POSTOP 4X8 (GAUZE/BANDAGES/DRESSINGS) ×2 IMPLANT
ELECT CAUTERY BLADE 6.4 (BLADE) ×2 IMPLANT
ELECT REM PT RETURN 9FT ADLT (ELECTROSURGICAL) ×8
ELECTRODE REM PT RTRN 9FT ADLT (ELECTROSURGICAL) ×4 IMPLANT
GAUZE SPONGE 4X4 12PLY STRL (GAUZE/BANDAGES/DRESSINGS) ×2 IMPLANT
GAUZE SPONGE 4X4 16PLY XRAY LF (GAUZE/BANDAGES/DRESSINGS) ×2 IMPLANT
GLOVE BIO SURGEON STRL SZ8 (GLOVE) ×2 IMPLANT
GLOVE BIOGEL PI IND STRL 7.0 (GLOVE) IMPLANT
GLOVE BIOGEL PI IND STRL 7.5 (GLOVE) ×6 IMPLANT
GLOVE BIOGEL PI IND STRL 8 (GLOVE) IMPLANT
GLOVE BIOGEL PI INDICATOR 7.0 (GLOVE) ×10
GLOVE BIOGEL PI INDICATOR 7.5 (GLOVE) ×8
GLOVE BIOGEL PI INDICATOR 8 (GLOVE) ×4
GLOVE ECLIPSE 7.5 STRL STRAW (GLOVE) ×2 IMPLANT
GLOVE INDICATOR 8.5 STRL (GLOVE) ×2 IMPLANT
GLOVE SS BIOGEL STRL SZ 7 (GLOVE) ×8 IMPLANT
GLOVE SUPERSENSE BIOGEL SZ 7 (GLOVE) ×14
GLOVE SURG SS PI 6.5 STRL IVOR (GLOVE) ×12 IMPLANT
GOWN STRL REUS W/ TWL LRG LVL3 (GOWN DISPOSABLE) ×6 IMPLANT
GOWN STRL REUS W/ TWL XL LVL3 (GOWN DISPOSABLE) ×2 IMPLANT
GOWN STRL REUS W/TWL 2XL LVL3 (GOWN DISPOSABLE) ×2 IMPLANT
GOWN STRL REUS W/TWL LRG LVL3 (GOWN DISPOSABLE) ×20
GOWN STRL REUS W/TWL XL LVL3 (GOWN DISPOSABLE) ×4
HEMOSTAT POWDER KIT SURGIFOAM (HEMOSTASIS) ×8 IMPLANT
KIT BASIN OR (CUSTOM PROCEDURE TRAY) ×6 IMPLANT
KIT DILATOR XLIF 5 (KITS) IMPLANT
KIT INFUSE SMALL (Orthopedic Implant) ×4 IMPLANT
KIT ROOM TURNOVER OR (KITS) ×6 IMPLANT
KIT SURGICAL ACCESS MAXCESS 4 (KITS) ×2 IMPLANT
KIT XLIF (KITS) ×2
MARKER SKIN DUAL TIP RULER LAB (MISCELLANEOUS) ×2 IMPLANT
MILL MEDIUM DISP (BLADE) ×2 IMPLANT
MODULE NVM5 NEXT GEN EMG (NEEDLE) ×2 IMPLANT
NDL HYPO 18GX1.5 BLUNT FILL (NEEDLE) ×2 IMPLANT
NDL HYPO 21X1.5 SAFETY (NEEDLE) ×6 IMPLANT
NDL HYPO 25X1 1.5 SAFETY (NEEDLE) ×4 IMPLANT
NEEDLE HYPO 18GX1.5 BLUNT FILL (NEEDLE) IMPLANT
NEEDLE HYPO 21X1.5 SAFETY (NEEDLE) ×12 IMPLANT
NEEDLE HYPO 25X1 1.5 SAFETY (NEEDLE) IMPLANT
NS IRRIG 1000ML POUR BTL (IV SOLUTION) ×6 IMPLANT
PACK LAMINECTOMY NEURO (CUSTOM PROCEDURE TRAY) ×8 IMPLANT
PACK UNIVERSAL I (CUSTOM PROCEDURE TRAY) ×6 IMPLANT
PAD ARMBOARD 7.5X6 YLW CONV (MISCELLANEOUS) ×18 IMPLANT
PATTIES SURGICAL .5 X.5 (GAUZE/BANDAGES/DRESSINGS) ×2 IMPLANT
PATTIES SURGICAL .5X1.5 (GAUZE/BANDAGES/DRESSINGS) ×2 IMPLANT
PATTIES SURGICAL 1X1 (DISPOSABLE) ×2 IMPLANT
PEDIGUARD CURV (INSTRUMENTS) ×2 IMPLANT
PENCIL BUTTON HOLSTER BLD 10FT (ELECTRODE) ×2 IMPLANT
PUTTY BONE ATTRAX 10CC STRIP (Putty) ×2 IMPLANT
ROD RELINE TI LORD 5.5X70 (Rod) ×4 IMPLANT
RUBBERBAND STERILE (MISCELLANEOUS) IMPLANT
SCREW LOCK RELINE 5.5 TULIP (Screw) ×8 IMPLANT
SCREW NON BREAK OFF LGY 55 (Screw) ×8 IMPLANT
SCREW SHANK RELINE 6.5X50MM 2S (Screw) ×8 IMPLANT
SCREW XL 45X5.5XVA NS SPNE (Screw) IMPLANT
SCREW XL VAR (Screw) ×8 IMPLANT
SEALANT ADHERUS EXTEND TIP (MISCELLANEOUS) ×2 IMPLANT
SPINE TULIP RELINE MOD (Neuro Prosthesis/Implant) ×16 IMPLANT
SPONGE LAP 4X18 X RAY DECT (DISPOSABLE) IMPLANT
SPONGE NEURO XRAY DETECT 1X3 (DISPOSABLE) ×2 IMPLANT
SPONGE SURGIFOAM ABS GEL 100 (HEMOSTASIS) ×2 IMPLANT
STAPLER VISISTAT 35W (STAPLE) ×4 IMPLANT
STRIP SURGICAL 1 X 6 IN (GAUZE/BANDAGES/DRESSINGS) IMPLANT
STRIP SURGICAL 1/2 X 6 IN (GAUZE/BANDAGES/DRESSINGS) IMPLANT
STRIP SURGICAL 1/4 X 6 IN (GAUZE/BANDAGES/DRESSINGS) IMPLANT
STRIP SURGICAL 3/4 X 6 IN (GAUZE/BANDAGES/DRESSINGS) IMPLANT
SUT PROLENE 6 0 BV (SUTURE) ×12 IMPLANT
SUT STRATAFIX 1PDS 45CM VIOLET (SUTURE) ×2 IMPLANT
SUT STRATAFIX MNCRL+ 3-0 PS-2 (SUTURE) ×4
SUT STRATAFIX MONOCRYL 3-0 (SUTURE) ×4
SUT STRATAFIX SPIRAL + 2-0 (SUTURE) ×4 IMPLANT
SUT VIC AB 0 CT1 18XCR BRD8 (SUTURE) ×4 IMPLANT
SUT VIC AB 0 CT1 8-18 (SUTURE) ×4
SUT VIC AB 1 CT1 18XBRD ANBCTR (SUTURE) ×2 IMPLANT
SUT VIC AB 1 CT1 8-18 (SUTURE)
SUT VIC AB 2-0 CT1 18 (SUTURE) ×8 IMPLANT
SUT VIC AB 3-0 SH 8-18 (SUTURE) ×6 IMPLANT
SUT VIC AB 4-0 PS2 27 (SUTURE) ×2 IMPLANT
SUTURE STRATFX MNCRL+ 3-0 PS-2 (SUTURE) IMPLANT
SYR 30ML LL (SYRINGE) ×10 IMPLANT
SYR 5ML LL (SYRINGE) ×2 IMPLANT
SYSTEM NUVAMAP OR (SYSTAGENIX WOUND MANAGEMENT) ×2 IMPLANT
TAPE CLOTH 3X10 TAN LF (GAUZE/BANDAGES/DRESSINGS) ×12 IMPLANT
TOWEL OR 17X24 6PK STRL BLUE (TOWEL DISPOSABLE) ×6 IMPLANT
TOWEL OR 17X26 10 PK STRL BLUE (TOWEL DISPOSABLE) ×8 IMPLANT
TRAY FOLEY W/METER SILVER 16FR (SET/KITS/TRAYS/PACK) ×4 IMPLANT
TUBE CONNECTING 12'X1/4 (SUCTIONS)
TUBE CONNECTING 12X1/4 (SUCTIONS) ×2 IMPLANT
TULIP SPINE RELINE MOD (Neuro Prosthesis/Implant) IMPLANT
WATER STERILE IRR 1000ML POUR (IV SOLUTION) ×6 IMPLANT
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
## 2015-12-30 NOTE — Progress Notes (Signed)
1730 ABG results to Dr. Krista BlueSinger.  ph 7.24  co2 53  po2 >100 Order to RT to reduce RR from 22 down to 18
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
## 2015-12-30 NOTE — Progress Notes (Signed)
ABG per RT PH 7.153 pco2  72.9 po2 127  seen by Dr. Krista BlueSinger . Pt returned to full vent support

## 2015-12-30 NOTE — Progress Notes (Signed)
Called anesthesia MD.  Pt has verbal order from Dr. Bevely Palmeritty to go to [facility] ICU.  Pt still not very arousable.  MD aware and ok with pt continuing to ICU unit.  Report given to Lauren RN in ICU.

## 2015-12-30 NOTE — Brief Op Note (Signed)
12/30/2015  3:24 PM  PATIENT:  [name]  70 y.o. female  PRE-OPERATIVE DIAGNOSIS:  Spondylolisthesis, Lumbosacral region  POST-OPERATIVE DIAGNOSIS:  Spondylolisthesis, Lumbosacral region  PROCEDURE:  Procedure(s): Lumbar two-three, Lumbar three-four Anterior Lateral Lumbar Interbody Fusion with Anterior column realignment at Lumbar three-four (Right) Lumbar two -Sacral one Dorsal Internal Fixation and Fusion, [name] Peterson osteotomies Lumbar two-three, Lumbar three-four (N/A)  SURGEON:  Surgeon(s) and Role:    * Truitt MerleBenjamin Jared Allan Bacigalupi, MD - Primary    * Donalee CitrinGary Cram, MD - Assisting  PHYSICIAN ASSISTANT:   ASSISTANTS: Donalee CitrinGary Cram, MD  ANESTHESIA:   general  EBL:  Total I/O In: 4200 [I.V.:3500; Blood:200; IV Piggyback:500] Out: 1350 [Urine:600; Blood:750]  BLOOD ADMINISTERED:none  DRAINS: none   LOCAL MEDICATIONS USED:  MARCAINE    and LIDOCAINE   SPECIMEN:  No Specimen  DISPOSITION OF SPECIMEN:  N/A  COUNTS:  YES  TOURNIQUET:  * No tourniquets in log *  DICTATION: .Dragon Dictation  PLAN OF CARE: Admit to inpatient   PATIENT DISPOSITION:  PACU - hemodynamically stable.   Delay start of Pharmacological VTE agent (>24hrs) due to surgical blood loss or risk of bleeding: yes

## 2015-12-30 NOTE — Progress Notes (Signed)
PHARMACIST - PHYSICIAN COMMUNICATION  CONCERNING: P&T Medication Policy Regarding Oral Bisphosphonates  RECOMMENDATION: Your order for alendronate (Fosamax), ibandronate (Boniva), or risedronate (Actonel) has been discontinued at this time.  If the patient's post-hospital medical condition warrants safe use of this class of drugs, please resume the pre-hospital regimen upon discharge.  DESCRIPTION:  Alendronate (Fosamax), ibandronate (Boniva), and risedronate (Actonel) can cause severe esophageal erosions in patients who are unable to remain upright at least 30 minutes after taking this medication.   Since brief interruptions in therapy are thought to have minimal impact on bone mineral density, the Pharmacy & Therapeutics Committee has established that bisphosphonate orders should be routinely discontinued during hospitalization.   To override this safety policy and permit administration of Boniva, Fosamax, or Actonel in the hospital, prescribers must write "DO NOT HOLD" in the comments section when placing the order for this class of medications.  Christoper Fabianaron Matalyn Nawaz, PharmD, BCPS Clinical pharmacist, pager [phone] 12/30/2015 9:46 PM

## 2015-12-30 NOTE — Progress Notes (Signed)
ABG collected  

## 2015-12-30 NOTE — Transfer of Care (Signed)
Immediate Anesthesia Transfer of Care Note  Patient: [name] A Crossman  Procedure(s) Performed: Procedure(s): Lumbar two-three, Lumbar three-four Anterior Lateral Lumbar Interbody Fusion with Anterior column realignment at Lumbar three-four (Right) Lumbar two -Sacral one Dorsal Internal Fixation and Fusion, [name] osteotomies Lumbar two-three, Lumbar three-four (N/A)  Patient Location: PACU  Anesthesia Type:General  Level of Consciousness: responds to stimulation and Patient remains intubated per anesthesia plan  Airway & Oxygen Therapy: Patient remains intubated per anesthesia plan and Patient placed on Ventilator (see vital sign flow sheet for setting)  Post-op Assessment: Report given to RN, Post -op Vital signs reviewed and stable and Patient moving all extremities X 4  Post vital signs: Reviewed and stable  Last Vitals:  Vitals:   12/30/15 1606 12/30/15 1607  BP: 108/77 133/64  Pulse: 99 97  Resp: (!) 5 18  Temp:      Last Pain:  Vitals:   12/30/15 0711  TempSrc:   PainSc: 8          Complications: No apparent anesthesia complications

## 2015-12-30 NOTE — Anesthesia Procedure Notes (Signed)
Procedure Name: Intubation Date/Time: 12/30/2015 8:19 AM Performed by: Reine JustFLOWERS, Tailyn Hantz T Pre-anesthesia Checklist: Patient identified, Emergency Drugs available, Suction available, Patient being monitored and Timeout performed Patient Re-evaluated:Patient Re-evaluated prior to inductionOxygen Delivery Method: Circle system utilized and Simple face mask Preoxygenation: Pre-oxygenation with 100% oxygen Intubation Type: IV induction Ventilation: Mask ventilation without difficulty Laryngoscope Size: Miller and 2 Grade View: Grade I Tube type: Oral Tube size: 7.5 mm Number of attempts: 1 Airway Equipment and Method: Patient positioned with wedge pillow and Stylet Placement Confirmation: ETT inserted through vocal cords under direct vision,  positive ETCO2 and breath sounds checked- equal and bilateral Secured at: 21 cm Tube secured with: Tape Dental Injury: Teeth and Oropharynx as per pre-operative assessment

## 2015-12-30 NOTE — Progress Notes (Signed)
Extubated by Dr. Maple HudsonMoser and placed on face mask at 6 L oxygen. Tolerated well . respiratory effort good

## 2015-12-31 DIAGNOSIS — J95821 Acute postprocedural respiratory failure: Secondary | ICD-10-CM

## 2015-12-31 DIAGNOSIS — N179 Acute kidney failure, unspecified: Secondary | ICD-10-CM

## 2015-12-31 LAB — CBC
HCT: 27.5 % — ABNORMAL LOW (ref 36.0–46.0)
HEMOGLOBIN: 8.9 g/dL — AB (ref 12.0–15.0)
MCH: 30.7 pg (ref 26.0–34.0)
MCHC: 32.4 g/dL (ref 30.0–36.0)
MCV: 94.8 fL (ref 78.0–100.0)
PLATELETS: 124 10*3/uL — AB (ref 150–400)
RBC: 2.9 MIL/uL — AB (ref 3.87–5.11)
RDW: 14.2 % (ref 11.5–15.5)
WBC: 13.5 10*3/uL — AB (ref 4.0–10.5)

## 2015-12-31 LAB — BASIC METABOLIC PANEL
ANION GAP: 3 — AB (ref 5–15)
BUN: 19 mg/dL (ref 6–20)
CHLORIDE: 112 mmol/L — AB (ref 101–111)
CO2: 22 mmol/L (ref 22–32)
Calcium: 8.5 mg/dL — ABNORMAL LOW (ref 8.9–10.3)
Creatinine, Ser: 1.17 mg/dL — ABNORMAL HIGH (ref 0.44–1.00)
GFR calc Af Amer: 54 mL/min — ABNORMAL LOW (ref >60)
GFR, EST NON AFRICAN AMERICAN: 47 mL/min — AB (ref >60)
Glucose, Bld: 127 mg/dL — ABNORMAL HIGH (ref 65–99)
POTASSIUM: 4.4 mmol/L (ref 3.5–5.1)
SODIUM: 137 mmol/L (ref 135–145)

## 2015-12-31 LAB — POCT I-STAT 3, ART BLOOD GAS (G3+)
ACID-BASE DEFICIT: 4 mmol/L — AB (ref 0.0–2.0)
BICARBONATE: 25.6 meq/L — AB (ref 20.0–24.0)
O2 SAT: 97 %
TCO2: 28 mmol/L (ref 0–100)
pCO2 arterial: 71.3 mmHg (ref 35.0–45.0)
pH, Arterial: 7.159 — CL (ref 7.350–7.450)
pO2, Arterial: 124 mmHg — ABNORMAL HIGH (ref 80.0–100.0)

## 2015-12-31 NOTE — Progress Notes (Signed)
Orthopedic Tech Progress Note Patient Details:  Amalia Haileyoni A Mathison [birth year] [id number]  Patient ID: Amalia Haileyoni A Estala, female   DOB: [birth year], 70 y.o.   MRN: [id number]   Nikki DomCrawford, Dacen Frayre 12/31/2015, 9:48 AM Called in bio-tech brace order; spoke with Judeth CornfieldStephanie

## 2015-12-31 NOTE — Progress Notes (Signed)
2140 patient received from PACU. Pt lethargic, minimally responsive, only responding to pain. Paged Neurosurgery. Spoke with Dr. Wynetta Emeryram and Dr. Bevely Palmeritty. CCM consulted per Dr. Bevely Palmeritty. Orders given to obtain ABG per CCM. Will continue to monitor.  2320 PA at bedside.  0127 MD at bedside.

## 2015-12-31 NOTE — Anesthesia Postprocedure Evaluation (Deleted)
Anesthesia Post Note  Patient: [name]  Procedure(s) Performed: Procedure(s) (LRB): Lumbar two-three, Lumbar three-four Anterior Lateral Lumbar Interbody Fusion with Anterior column realignment at Lumbar three-four (Right) Lumbar two -Sacral one Dorsal Internal Fixation and Fusion, [name] osteotomies Lumbar two-three, Lumbar three-four (N/A)  Patient location during evaluation: PACU Anesthesia Type: General Level of consciousness: sedated Pain management: pain level controlled Vital Signs Assessment: post-procedure vital signs reviewed and stable Respiratory status: spontaneous breathing and respiratory function stable Cardiovascular status: stable Anesthetic complications: no             Shawn Carattini DANIEL
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
## 2015-12-31 NOTE — Evaluation (Signed)
Occupational Therapy Evaluation Patient Details Name: [name] MRN: [id number] DOB: [birth year] Today's Date: 12/31/2015    History of Present Illness 70 yo female s/p L 2-3 L3-4 XLIF PMH: arthritis, htn, PNA, tobacco abuse, asthma, laminectomy fusion L4-5 L5-S1 R hand R knee    Clinical Impression   Patient is s/p XLIF L23- L3-4  surgery resulting in functional limitations due to the deficits listed below (see OT problem list). PTA was living alone and independent. Pt currently with HR 130s with mobility.  Patient will benefit from skilled OT acutely to increase independence and safety with ADLS to allow discharge HHOT.     Follow Up Recommendations  Home health OT    Equipment Recommendations  3 in 1 bedside comode    Recommendations for Other Services       Precautions / Restrictions Precautions Precautions: Back Precaution Comments: handout provided and reviewed in detail for adls. Required Braces or Orthoses:  (order placed and pending back brace arrival)      Mobility Bed Mobility Overal bed mobility: Needs Assistance Bed Mobility: Rolling;Supine to Sit Rolling: Mod assist   Supine to sit: Mod assist     General bed mobility comments: pt with pad used to help with transfers and comfort for patient. pt following sequence  Transfers Overall transfer level: Needs assistance   Transfers: Sit to/from Stand Sit to Stand: Mod assist         General transfer comment: pt with hand held (A)     Balance Overall balance assessment: Needs assistance Sitting-balance support: Bilateral upper extremity supported;Feet supported Sitting balance-Leahy Scale: Fair     Standing balance support: Bilateral upper extremity supported;During functional activity Standing balance-Leahy Scale: Poor                              ADL Overall ADL's : Needs assistance/impaired Eating/Feeding: Set up;Sitting Eating/Feeding Details (indicate cue type and  reason): pt reports nausea and not hungry on arrival. Once in chair pt asking for coffee Grooming: Wash/dry face;Set up;Sitting   Upper Body Bathing: Minimal assitance;Sitting   Lower Body Bathing: Maximal assistance           Toilet Transfer: Moderate assistance;Stand-pivot Toilet Transfer Details (indicate cue type and reason): simulated EOB to chair with hand held (A)            General ADL Comments: Pt educated on bed mobility, back precaution with adls and asked to repeat all information with teach back to help pt recall.      Vision     Perception     Praxis      Pertinent Vitals/Pain Pain Assessment: Faces Faces Pain Scale: Hurts even more Pain Location: back Pain Descriptors / Indicators: Aching Pain Intervention(s): Monitored during session;Premedicated before session;Repositioned;Limited activity within patient's tolerance;RN gave pain meds during session     Hand Dominance Right   Extremity/Trunk Assessment Upper Extremity Assessment Upper Extremity Assessment: Overall WFL for tasks assessed   Lower Extremity Assessment Lower Extremity Assessment: Defer to PT evaluation   Cervical / Trunk Assessment Cervical / Trunk Assessment: Other exceptions (s/p surg)   Communication Communication Communication: No difficulties   Cognition Arousal/Alertness: Awake/alert Behavior During Therapy: WFL for tasks assessed/performed Overall Cognitive Status: Within Functional Limits for tasks assessed                     General Comments       Exercises  Shoulder Instructions      Home Living Family/patient expects to be discharged to:: Private residence Living Arrangements: Alone Available Help at Discharge: Family;Available PRN/intermittently (three children and sister will be PRN ) Type of Home: Apartment Home Access: Level entry     Home Layout: One level     Bathroom Shower/Tub: Chief Strategy OfficerTub/shower unit   Bathroom Toilet: Standard      Home Equipment: None   Additional Comments: drives,       Prior Functioning/Environment Level of Independence: Independent             OT Diagnosis: Generalized weakness;Acute pain   OT Problem List: Decreased strength;Decreased activity tolerance;Impaired balance (sitting and/or standing);Decreased cognition;Decreased safety awareness;Decreased knowledge of use of DME or AE;Decreased knowledge of precautions;Pain;Cardiopulmonary status limiting activity   OT Treatment/Interventions: Self-care/ADL training;Therapeutic exercise;DME and/or AE instruction;Therapeutic activities;Patient/family education;Balance training    OT Goals(Current goals can be found in the care plan section) Acute Rehab OT Goals Patient Stated Goal: to return home and be able to unpack her home OT Goal Formulation: With patient/family Time For Goal Achievement: 01/14/16 Potential to Achieve Goals: Good  OT Frequency: Min 2X/week   Barriers to D/C:            Co-evaluation              End of Session Equipment Utilized During Treatment: Gait belt Nurse Communication: Mobility status;Precautions  Activity Tolerance: Patient tolerated treatment well Patient left: in chair;with call bell/phone within reach;with chair alarm set;with family/visitor present;with nursing/sitter in room   Time: 0800-0837 OT Time Calculation (min): 37 min Charges:  OT General Charges $OT Visit: 1 Procedure OT Evaluation $OT Eval Moderate Complexity: 1 Procedure OT Treatments $Self Care/Home Management : 8-22 mins G-Codes:    [name], [name] 12/31/2015, 8:57 AM  [name], Mackenzie   OTR/L Pager: [phone]: [phone] Office: [phone] .

## 2015-12-31 NOTE — Progress Notes (Signed)
Patient was very slow to awaken from anesthesia Doing well now Minimal pain Moving legs well Stable Ok to be up now TTF PT/OT

## 2015-12-31 NOTE — Evaluation (Signed)
Physical Therapy Evaluation Patient Details Name: [name] MRN: [id number] DOB: 06/04/1945 Today's Date: 12/31/2015   History of Present Illness  70 yo female s/p L 2-3 L3-4 XLIF PMH: arthritis, htn, PNA, tobacco abuse, asthma, laminectomy fusion L4-5 L5-S1 R hand, R knee, R VATS.  Clinical Impression  Patient presents with decreased independence with mobility due to deficits listed in PT problem list.  She will benefit from skilled PT in the acute setting to allow return home following SNF level rehab stay.      Follow Up Recommendations SNF    Equipment Recommendations  Rolling walker with 5" wheels    Recommendations for Other Services       Precautions / Restrictions Precautions Precautions: Back;Fall Required Braces or Orthoses: Spinal Brace Spinal Brace: Thoracolumbosacral orthotic;Applied in sitting position (was on while pt in supine after fitting by orthotist)      Mobility  Bed Mobility Overal bed mobility: Needs Assistance Bed Mobility: Rolling;Sidelying to Sit Rolling: Min assist Sidelying to sit: Mod assist       General bed mobility comments: assist for lifting trunk, used rail to assist with rolling  Transfers Overall transfer level: Needs assistance Equipment used: Rolling walker (2 wheeled) Transfers: Sit to/from Stand Sit to Stand: Mod assist         General transfer comment: heavy assist up from bed  Ambulation/Gait Ambulation/Gait assistance: Min assist Ambulation Distance (Feet): 45 Feet Assistive device: Rolling walker (2 wheeled) Gait Pattern/deviations: Step-through pattern;Decreased stride length;Trunk flexed     General Gait Details: cues for posture, for walker safety  Stairs            Wheelchair Mobility    Modified Rankin (Stroke Patients Only)       Balance Overall balance assessment: Needs assistance Sitting-balance support: Bilateral upper extremity supported;Feet supported Sitting balance-Leahy Scale:  Fair     Standing balance support: Bilateral upper extremity supported Standing balance-Leahy Scale: Poor Standing balance comment: reliant on UE support for balance                             Pertinent Vitals/Pain Faces Pain Scale: Hurts even more Pain Location: back esp with transitions Pain Descriptors / Indicators: Discomfort;Operative site guarding Pain Intervention(s): Monitored during session;Repositioned    Home Living Family/patient expects to be discharged to:: Skilled nursing facility Living Arrangements: Alone Available Help at Discharge: Family;Available PRN/intermittently (intermittent help only) Type of Home: Apartment Home Access: Level entry     Home Layout: One level Home Equipment: None Additional Comments: drives,     Prior Function Level of Independence: Independent               Hand Dominance   Dominant Hand: Right    Extremity/Trunk Assessment   Upper Extremity Assessment: Overall WFL for tasks assessed           Lower Extremity Assessment: Generalized weakness      Cervical / Trunk Assessment: Other exceptions (s/p back surgery)  Communication   Communication: No difficulties  Cognition Arousal/Alertness: Awake/alert Behavior During Therapy: WFL for tasks assessed/performed Overall Cognitive Status: Within Functional Limits for tasks assessed                      General Comments      Exercises        Assessment/Plan    PT Assessment Patient needs continued PT services  PT Diagnosis Difficulty walking;Acute pain;Generalized weakness  PT Problem List Decreased strength;Decreased mobility;Pain;Decreased balance;Decreased knowledge of use of DME;Decreased activity tolerance;Decreased knowledge of precautions;Decreased safety awareness  PT Treatment Interventions Gait training;DME instruction;Functional mobility training;Therapeutic exercise;Therapeutic activities;Balance training;Patient/family  education   PT Goals (Current goals can be found in the Care Plan section) Acute Rehab PT Goals Patient Stated Goal: To be safe at home PT Goal Formulation: With patient Time For Goal Achievement: 01/07/16 Potential to Achieve Goals: Good    Frequency Min 5X/week   Barriers to discharge Decreased caregiver support      Co-evaluation               End of Session Equipment Utilized During Treatment: Back brace Activity Tolerance: Patient limited by fatigue Patient left: in chair;with call bell/phone within reach           Time: 1229-1255 PT Time Calculation (min) (ACUTE ONLY): 26 min   Charges:   PT Evaluation $PT Eval Moderate Complexity: 1 Procedure PT Treatments $Gait Training: 8-22 mins   PT G CodesElray Davis:        [name] 12/31/2015, 1:38 PM  [name], PT [phone] 12/31/2015

## 2015-12-31 NOTE — Progress Notes (Signed)
PULMONARY / CRITICAL CARE MEDICINE   Name: [name] MRN: [id number] DOB: 24-Feb-1946    ADMISSION DATE:  12/30/2015 CONSULTATION DATE:  12/30/2015  REFERRING MD:  Johnnye LanaBen Ditty, M.D. / Neurosurgery  CHIEF COMPLAINT:  Altered Mental Status  HISTORY OF PRESENT ILLNESS:  70 year old female with known history of tobacco use as well as underlying asthma and dyslipidemia with chronic kidney disease presented for lumbar fixation and decompression for treatment of lumbar radiculopathy and ongoing pain. Patient underwent the procedure with endotracheal intubation and ketamine infusion. Patient was subsequently extubated in the PACU and did have some nausea with vomiting post extubation. She was administered Phenergan IV. She was also given IV acetaminophen. Oral Neurontin and Celebrex were given preoperatively. IV Robaxin was administered postoperatively. Post extubation patient's mental status has very slowly improved per nursing report. She has had more spontaneous movement and has intermittently been opening her eyes but still not consistently following commands. Neurosurgery contacted our service for evaluation and possible need for reintubation.    SUBJECTIVE: NAD  VITAL SIGNS: BP 130/83 (BP Location: Right Arm)   Pulse (!) 114   Temp 99.9 F (37.7 C) (Oral)   Resp 15   Ht 5\' 5"  (1.651 m)   Wt 145 lb 11.6 oz (66.1 kg)   SpO2 98%   BMI 24.25 kg/m   HEMODYNAMICS:    VENTILATOR SETTINGS: Vent Mode: PSV;CPAP FiO2 (%):  [40 %] 40 % Set Rate:  [18 bmp-22 bmp] 18 bmp Vt Set:  [480 mL] 480 mL PEEP:  [5 cmH20] 5 cmH20 Pressure Support:  [5 cmH20] 5 cmH20 Plateau Pressure:  [14 cmH20] 14 cmH20  INTAKE / OUTPUT: I/O last 3 completed shifts: In: 5225 [I.V.:4425; Blood:200; IV Piggyback:600] Out: 2815 [Urine:2065; Blood:750]  PHYSICAL EXAMINATION: General: Adult female, walking in room laughing Neuro: Awake and alert HEENT: /AT. PERRL, sclerae anicteric. Cardiovascular: RRR, no  M/R/G.  Lungs: Respirations even and unlabored.  CTA bilaterally, No W/R/R.  Abdomen: BS x 4, soft, NT/ND.  Musculoskeletal: No gross deformities, no edema.  Skin: Intact, warm, no rashes.   LABS:  BMET  Recent Labs Lab 12/27/15 1455 12/30/15 2336  NA 142 137  K 4.0 4.4  CL 110 112*  CO2 23 22  BUN 16 19  CREATININE 1.42* 1.17*  GLUCOSE 57* 127*    Electrolytes  Recent Labs Lab 12/27/15 1455 12/30/15 2336  CALCIUM 10.6* 8.5*    CBC  Recent Labs Lab 12/27/15 1455 12/30/15 2336  WBC 9.8 13.5*  HGB 13.7 8.9*  HCT 42.1 27.5*  PLT 183 124*    Coag's  Recent Labs Lab 12/27/15 1455  APTT 31  INR 0.98    Sepsis Markers No results for input(s): LATICACIDVEN, PROCALCITON, O2SATVEN in the last 168 hours.  ABG  Recent Labs Lab 12/30/15 1735 12/30/15 2233  PHART 7.249* 7.331*  PCO2ART 52.7* 42.1  PO2ART 151* 111.0*    Liver Enzymes No results for input(s): AST, ALT, ALKPHOS, BILITOT, ALBUMIN in the last 168 hours.  Cardiac Enzymes No results for input(s): TROPONINI, PROBNP in the last 168 hours.  Glucose No results for input(s): GLUCAP in the last 168 hours.  Imaging Dg Lumbar Spine 2-3 Views  Result Date: 12/30/2015 CLINICAL DATA:  L2 through L4 fusion. EXAM: DG C-ARM 61-120 MIN; LUMBAR SPINE - 2-3 VIEW COMPARISON:  Lumbar spine MR dated 11/21/2015. FINDINGS: Previously, the last open disc space was labeled the L5-S1 level. The current images are labeled accordingly. Pedicle screw and rod fixation at the  L2 through L5 levels with interbody fusion at the L2-3 and L3-4 levels. There are also horizontal fixation screws at the L3 and L4 levels. Grade 1 anterolisthesis at the L3-4 level without significant change. S1 pedicle screws are noted. IMPRESSION: Operative changes, as described above. Electronically Signed   By: Beckie SaltsSteven  Reid M.D.   On: 12/30/2015 15:10   Dg C-arm 61-120 Min  Result Date: 12/30/2015 CLINICAL DATA:  L2 through L4 fusion. EXAM:  DG C-ARM 61-120 MIN; LUMBAR SPINE - 2-3 VIEW COMPARISON:  Lumbar spine MR dated 11/21/2015. FINDINGS: Previously, the last open disc space was labeled the L5-S1 level. The current images are labeled accordingly. Pedicle screw and rod fixation at the L2 through L5 levels with interbody fusion at the L2-3 and L3-4 levels. There are also horizontal fixation screws at the L3 and L4 levels. Grade 1 anterolisthesis at the L3-4 level without significant change. S1 pedicle screws are noted. IMPRESSION: Operative changes, as described above. Electronically Signed   By: Beckie SaltsSteven  Reid M.D.   On: 12/30/2015 15:10     STUDIES:  None.  MICROBIOLOGY: MRSA PCR 8/11:  Negative  ANTIBIOTICS: Ancef Periop  SIGNIFICANT EVENTS: 8/14 - Admit for Lumbar Surgery / Extubated in PACU  LINES/TUBES: Foley 8/14 >>out R Radial Art Line 8/14 >>out PIV x2  DISCUSSION: 70 y.o. female post lumbar fixation & decompression. Extubated in PACU, had hypersomnolence in ICU.  PCCM called for concern may require re-intubation.  ASSESSMENT / PLAN:  NEUROLOGIC A:   S/P Lumbar Fusion/Decompression POD #0. Acute Encephalopathy - Likely multifactorial from sedation. Resolved 8/15 Post-Operative Pain Control P:   OK to go out of ICU Post-op care per Dr. Bevely Palmeritty.  PULMONARY A: H/O Asthma. Tobacco Use Disorder. P:  Continue albuterol, dulera, spiriva. Tobacco cessation counseling.  CARDIOVASCULAR A:  H/O Dyslipidemia, HTN. P:  Vitals per unit protocol. Continuous BP monitoring w/ art line.  RENAL Lab Results  Component Value Date   CREATININE 1.17 (H) 12/30/2015   CREATININE 1.42 (H) 12/27/2015   CREATININE 1.50 (H) 11/13/2015    A:   CKD - Baseline Creatinine 1.3-1.5. P:   Monitoring UOP with Foley. BMP   GASTROINTESTINAL A:   No acute issues. P:   Start diet  HEMATOLOGIC  Recent Labs  12/30/15 2336  HGB 8.9*    A:   No acute issues. P:  Checked CBC   INFECTIOUS A:   No indication of  infection. P:   Monitor clinically.  ENDOCRINE CBG (last 3)    A:   No acute issue. P:   Monitor glucose on serum chemistry.   FAMILY  - Updates: Sister updated at bedside with patient up walking.  - Inter-disciplinary family meet or Palliative Care meeting due by:  8/21. Global: Up walking around, no CCM issues PCCM will sign off 8/15.   Brett CanalesSteve Nelissa Bolduc ACNP [facility] P[facility] Pager [phone] till 3 pm If no answer page [phone] 12/31/2015, 9:10 AM

## 2015-12-31 NOTE — Progress Notes (Signed)
PT Cancellation Note  Patient Details Name: [name] A Kilgour MRN: [id number] DOB: 06-06-1945   Cancelled Treatment:    Reason Eval/Treat Not Completed: Fatigue/lethargy limiting ability to participate; patient just back to bed after OOB with OT this AM.  Brace not yet here.  Will attempt later and hopefully ambulation when brace arrives.    Elray McgregorCynthia Ulises Wolfinger 12/31/2015, 9:53 AM  Sheran Lawlessyndi Diangelo Radel, PT [phone] 12/31/2015

## 2016-01-01 ENCOUNTER — Encounter (HOSPITAL_COMMUNITY): Payer: Self-pay | Admitting: Neurological Surgery

## 2016-01-01 DIAGNOSIS — J9692 Respiratory failure, unspecified with hypercapnia: Secondary | ICD-10-CM

## 2016-01-01 DIAGNOSIS — I10 Essential (primary) hypertension: Secondary | ICD-10-CM

## 2016-01-01 DIAGNOSIS — R000 Tachycardia, unspecified: Secondary | ICD-10-CM

## 2016-01-01 DIAGNOSIS — D62 Acute posthemorrhagic anemia: Secondary | ICD-10-CM

## 2016-01-01 DIAGNOSIS — M549 Dorsalgia, unspecified: Secondary | ICD-10-CM

## 2016-01-01 DIAGNOSIS — Z419 Encounter for procedure for purposes other than remedying health state, unspecified: Secondary | ICD-10-CM

## 2016-01-01 DIAGNOSIS — J9690 Respiratory failure, unspecified, unspecified whether with hypoxia or hypercapnia: Secondary | ICD-10-CM

## 2016-01-01 DIAGNOSIS — D72829 Elevated white blood cell count, unspecified: Secondary | ICD-10-CM

## 2016-01-01 DIAGNOSIS — M4727 Other spondylosis with radiculopathy, lumbosacral region: Principal | ICD-10-CM

## 2016-01-01 DIAGNOSIS — J449 Chronic obstructive pulmonary disease, unspecified: Secondary | ICD-10-CM

## 2016-01-01 DIAGNOSIS — D696 Thrombocytopenia, unspecified: Secondary | ICD-10-CM

## 2016-01-01 DIAGNOSIS — N183 Chronic kidney disease, stage 3 (moderate): Secondary | ICD-10-CM

## 2016-01-01 DIAGNOSIS — G8929 Other chronic pain: Secondary | ICD-10-CM

## 2016-01-01 MED FILL — Sodium Chloride IV Soln 0.9%: INTRAVENOUS | Qty: 1000 | Status: AC

## 2016-01-01 MED FILL — Heparin Sodium (Porcine) Inj 1000 Unit/ML: INTRAMUSCULAR | Qty: 30 | Status: AC
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
## 2016-01-01 NOTE — Progress Notes (Signed)
I met with pt at bedside to discuss a possible inpt rehab admission. She prefers inpt rehab rather than SNF. Very active prior to [facility] 7/23. She states her daughter is local and she can provide intermittent assist. I offered admission today after speaking with Dr. Posey Pronto, but pt prefers admission on Thursday. We do have a bed available and can admit. Pt is in agreement to this plan. Karene Fry will follow up in my absence Thursday and Friday. She can be reached at [phone]. Please call me with any questions today. [phone]

## 2016-01-01 NOTE — Progress Notes (Signed)
No acute events Doing very well Ambulating with walker Incisions look good Moving legs well Stable Dispo planning, maybe rehab

## 2016-01-01 NOTE — Anesthesia Postprocedure Evaluation (Signed)
Anesthesia Post Note  Patient: [name]  Procedure(s) Performed: Procedure(s) (LRB): Lumbar two-three, Lumbar three-four Anterior Lateral Lumbar Interbody Fusion with Anterior column realignment at Lumbar three-four (Right) Lumbar two -Sacral one Dorsal Internal Fixation and Fusion, [name] osteotomies Lumbar two-three, Lumbar three-four (N/A)  Patient location during evaluation: A-ICU Anesthesia Type: General Level of consciousness: sedated Pain management: pain level controlled Vital Signs Assessment: post-procedure vital signs reviewed and stable Respiratory status: spontaneous breathing and respiratory function stable Cardiovascular status: stable Anesthetic complications: yes Anesthetic complication details: respiratory eventComments: Pt required ventilator Post-op, but was extubated in the PACU.                 Alyzabeth Pontillo DANIEL

## 2016-01-01 NOTE — Care Management Note (Signed)
Case Management Note  Patient Details  Name: [name] A Verrette MRN: [id number] Date of Birth: Jun 17, 1945  Subjective/Objective:     Pt underwent: Lateral interbody fusion L2-3, L3-4; osteotomy of spine with release of anterior longitudinal ligament including discectomy L3-4; [name] osteotomies L2-3 and L3-4; pedicle screw fixation L2 and L3 bilaterally, posterolateral fusion L2-3, L3-4. She is from home alone.                Action/Plan: PT recommending SNF. OT recommending HH services. CM following for discharge disposition.   Expected Discharge Date:                  Expected Discharge Plan:  Skilled Nursing Facility  In-House Referral:     Discharge planning Services     Post Acute Care Choice:    Choice offered to:     DME Arranged:    DME Agency:     HH Arranged:    HH Agency:     Status of Service:  In process, will continue to follow  If discussed at Long Length of Stay Meetings, dates discussed:    Additional Comments:  Kermit BaloKelli F Nathalie Cavendish, RN 01/01/2016, 11:05 AM

## 2016-01-01 NOTE — Progress Notes (Signed)
Occupational Therapy Treatment Patient Details Name: [name] MRN: [id number] DOB: [birth year] Today's Date: 01/01/2016    History of present illness 70 yo female s/p L 2-3 L3-4 XLIF PMH: arthritis, htn, PNA, tobacco abuse, asthma, laminectomy fusion L4-5 L5-S1 R hand, R knee, R VATS.   OT comments  Pt unable to don or doff TLSO without total (A). Pt needed (A) for bed mobility and redirection to task. Recommendation for SNf due to incr (A) needed at this time.    Follow Up Recommendations  SNF    Equipment Recommendations  3 in 1 bedside comode    Recommendations for Other Services      Precautions / Restrictions Precautions Precautions: Back;Fall Required Braces or Orthoses: Spinal Brace Spinal Brace: Thoracolumbosacral orthotic;Applied in sitting position       Mobility Bed Mobility Overal bed mobility: Needs Assistance Bed Mobility: Rolling;Sidelying to Sit Rolling: Min assist Sidelying to sit: Mod assist       General bed mobility comments: cues for safety and sequence   Transfers Overall transfer level: Needs assistance Equipment used: Rolling walker (2 wheeled) Transfers: Sit to/from Stand Sit to Stand: Min assist         General transfer comment: cues for safety    Balance Overall balance assessment: Needs assistance           Standing balance-Leahy Scale: Poor                     ADL Overall ADL's : Needs assistance/impaired     Grooming: Wash/dry hands;Min guard;Sitting           Upper Body Dressing : Total assistance;Sitting Upper Body Dressing Details (indicate cue type and reason): don TLSO      Toilet Transfer: Minimal assistance;RW;BSC Toilet Transfer Details (indicate cue type and reason): needed cues for safety adn positioning Toileting- Clothing Manipulation and Hygiene: Minimal assistance       Functional mobility during ADLs: Minimal assistance General ADL Comments: pt needed reeducation to back  precautions. pt repeating same statements multiple times and lack of awareness       Vision                     Perception     Praxis      Cognition   Behavior During Therapy: [facility] for tasks assessed/performed Overall Cognitive Status: Within Functional Limits for tasks assessed                       Extremity/Trunk Assessment               Exercises     Shoulder Instructions       General Comments      Pertinent Vitals/ Pain       Pain Assessment: Faces Faces Pain Scale: Hurts little more Pain Location: back Pain Descriptors / Indicators: Sore Pain Intervention(s): Monitored during session;Premedicated before session;Repositioned  Home Living                                          Prior Functioning/Environment              Frequency Min 2X/week     Progress Toward Goals  OT Goals(current goals can now be found in the care plan section)  Progress towards OT goals: Progressing toward goals  Acute Rehab  OT Goals Patient Stated Goal: To be safe at home OT Goal Formulation: With patient/family Time For Goal Achievement: 01/14/16 Potential to Achieve Goals: Good ADL Goals Pt Will Perform Lower Body Bathing: with min guard assist;sit to/from stand;with adaptive equipment Pt Will Perform Lower Body Dressing: with min guard assist;sit to/from stand;with adaptive equipment Pt Will Transfer to Toilet: with min guard assist;ambulating;bedside commode Additional ADL Goal #1: Pt will complete bed mobility min guard (A) without bed rails.   Plan Discharge plan needs to be updated    Co-evaluation                 End of Session Equipment Utilized During Treatment: Gait belt   Activity Tolerance Patient tolerated treatment well   Patient Left in chair;with call bell/phone within reach;with chair alarm set;with family/visitor present;with nursing/sitter in room   Nurse Communication Mobility status;Precautions         Time: [id number] OT Time Calculation (min): 25 min  Charges: OT General Charges $OT Visit: 1 Procedure OT Treatments $Self Care/Home Management : 23-37 mins  Harolyn RutherfordJones, Marchel Foote B 01/01/2016, 4:20 PM  Mateo FlowJones, Brynn   OTR/L Pager: [phone] Office: [phone] .
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
## 2016-01-01 NOTE — Progress Notes (Signed)
Physical Therapy Treatment Patient Details Name: [name] MRN: [id number] DOB: 1945-11-09 Today's Date: 01/01/2016    History of Present Illness 70 yo female s/p L 2-3 L3-4 XLIF PMH: arthritis, htn, PNA, tobacco abuse, asthma, laminectomy fusion L4-5 L5-S1 R hand, R knee, R VATS.    PT Comments    Progressing slowly.  Needs lots of reinforcement of education, but is eager to learn and "do things right".   Follow Up Recommendations  SNF     Equipment Recommendations  Rolling walker with 5" wheels    Recommendations for Other Services       Precautions / Restrictions Precautions Precautions: Back;Fall Required Braces or Orthoses: Spinal Brace Spinal Brace: Thoracolumbosacral orthotic;Applied in sitting position    Mobility  Bed Mobility Overal bed mobility: Needs Assistance Bed Mobility: Rolling;Sidelying to Sit Rolling: Min assist Sidelying to sit: Mod assist       General bed mobility comments: assist for lifting trunk, used rail to assist with rolling  Transfers Overall transfer level: Needs assistance Equipment used: Rolling walker (2 wheeled) Transfers: Sit to/from Stand Sit to Stand: Mod assist;Min assist (min 2nd trial)         General transfer comment: cues for safety  Ambulation/Gait Ambulation/Gait assistance: Min assist Ambulation Distance (Feet): 140 Feet Assistive device: Rolling walker (2 wheeled) Gait Pattern/deviations: Step-through pattern   Gait velocity interpretation: Below normal speed for age/gender General Gait Details: cues for safe use of RW, postural checks.  Noticeably fatigued on return with shaky legs.   Stairs            Wheelchair Mobility    Modified Rankin (Stroke Patients Only)       Balance Overall balance assessment: No apparent balance deficits (not formally assessed)   Sitting balance-Leahy Scale: Fair       Standing balance-Leahy Scale: Poor                      Cognition  Arousal/Alertness: Awake/alert Behavior During Therapy: WFL for tasks assessed/performed Overall Cognitive Status: Within Functional Limits for tasks assessed                      Exercises      General Comments General comments (skin integrity, edema, etc.): pt instructed in back care/prec, log roll/transitions to/from sit, lifting restrictions, bracing issues, progression of activity.      Pertinent Vitals/Pain Pain Assessment: 0-10 Pain Score: 9  Pain Location: back and R LE Pain Descriptors / Indicators: Aching;Sore Pain Intervention(s): Limited activity within patient's tolerance;Patient requesting pain meds-RN notified;Repositioned    Home Living Family/patient expects to be discharged to:: Skilled nursing facility Living Arrangements: Alone Available Help at Discharge: Family;Available PRN/intermittently Type of Home: Apartment Home Access: Level entry   Home Layout: One level Home Equipment: None Additional Comments: drives,     Prior Function Level of Independence: Independent          PT Goals (current goals can now be found in the care plan section) Acute Rehab PT Goals PT Goal Formulation: With patient Time For Goal Achievement: 01/07/16 Potential to Achieve Goals: Good Progress towards PT goals: Progressing toward goals    Frequency  Min 5X/week    PT Plan      Co-evaluation             End of Session Equipment Utilized During Treatment: Back brace Activity Tolerance: Patient limited by fatigue Patient left: in chair;with call bell/phone within reach  Time: [id number] PT Time Calculation (min) (ACUTE ONLY): 57 min  Charges:  $Gait Training: 8-22 mins $Therapeutic Activity: 23-37 mins $Self Care/Home Management: 8-22                    G Codes:      Delorse Shane, Eliseo GumKenneth V 01/01/2016, 10:56 AM 01/01/2016  Baraga BingKen [name], PT [phone]  (pager)

## 2016-01-02 ENCOUNTER — Inpatient Hospital Stay (HOSPITAL_COMMUNITY)
Admission: RE | Admit: 2016-01-02 | Discharge: 2016-01-14 | DRG: 052 | Disposition: A | Discharge status: Skilled Nursing Facility | Payer: Medicare Other | Source: Intra-hospital | Attending: Physical Medicine & Rehabilitation | Admitting: Physical Medicine & Rehabilitation

## 2016-01-02 DIAGNOSIS — Z79899 Other long term (current) drug therapy: Secondary | ICD-10-CM | POA: Diagnosis not present

## 2016-01-02 DIAGNOSIS — Z87891 Personal history of nicotine dependence: Secondary | ICD-10-CM | POA: Diagnosis not present

## 2016-01-02 DIAGNOSIS — G479 Sleep disorder, unspecified: Secondary | ICD-10-CM | POA: Diagnosis not present

## 2016-01-02 DIAGNOSIS — G8918 Other acute postprocedural pain: Secondary | ICD-10-CM | POA: Diagnosis present

## 2016-01-02 DIAGNOSIS — K5900 Constipation, unspecified: Secondary | ICD-10-CM | POA: Diagnosis present

## 2016-01-02 DIAGNOSIS — Z91013 Allergy to seafood: Secondary | ICD-10-CM | POA: Diagnosis not present

## 2016-01-02 DIAGNOSIS — N189 Chronic kidney disease, unspecified: Secondary | ICD-10-CM | POA: Diagnosis present

## 2016-01-02 DIAGNOSIS — M792 Neuralgia and neuritis, unspecified: Secondary | ICD-10-CM | POA: Diagnosis not present

## 2016-01-02 DIAGNOSIS — M4316 Spondylolisthesis, lumbar region: Secondary | ICD-10-CM | POA: Diagnosis not present

## 2016-01-02 DIAGNOSIS — G629 Polyneuropathy, unspecified: Secondary | ICD-10-CM | POA: Diagnosis present

## 2016-01-02 DIAGNOSIS — J449 Chronic obstructive pulmonary disease, unspecified: Secondary | ICD-10-CM | POA: Diagnosis present

## 2016-01-02 DIAGNOSIS — Z981 Arthrodesis status: Secondary | ICD-10-CM | POA: Diagnosis not present

## 2016-01-02 DIAGNOSIS — I959 Hypotension, unspecified: Secondary | ICD-10-CM | POA: Diagnosis present

## 2016-01-02 DIAGNOSIS — E785 Hyperlipidemia, unspecified: Secondary | ICD-10-CM | POA: Diagnosis present

## 2016-01-02 DIAGNOSIS — I129 Hypertensive chronic kidney disease with stage 1 through stage 4 chronic kidney disease, or unspecified chronic kidney disease: Secondary | ICD-10-CM | POA: Diagnosis present

## 2016-01-02 DIAGNOSIS — F411 Generalized anxiety disorder: Secondary | ICD-10-CM | POA: Diagnosis present

## 2016-01-02 DIAGNOSIS — D62 Acute posthemorrhagic anemia: Secondary | ICD-10-CM | POA: Diagnosis present

## 2016-01-02 DIAGNOSIS — I10 Essential (primary) hypertension: Secondary | ICD-10-CM | POA: Diagnosis not present

## 2016-01-02 DIAGNOSIS — G8220 Paraplegia, unspecified: Secondary | ICD-10-CM | POA: Diagnosis present

## 2016-01-02 DIAGNOSIS — N179 Acute kidney failure, unspecified: Secondary | ICD-10-CM | POA: Diagnosis present

## 2016-01-02 MED ORDER — DOCUSATE SODIUM 100 MG PO CAPS: 100.0000 mg | ORAL_CAPSULE | Freq: Two times a day (BID) | ORAL | 0 refills | Status: DC

## 2016-01-02 MED ORDER — MONTELUKAST SODIUM 10 MG PO TABS
10.0000 mg | ORAL_TABLET | Freq: Every day | ORAL | Status: DC
  Administered 2016-01-02 – 2016-01-13 (×11): 10 mg via ORAL
  Filled 2016-01-02 (×12): qty 1

## 2016-01-02 MED ORDER — ACETAMINOPHEN 325 MG PO TABS: 650.0000 mg | ORAL_TABLET | Freq: Four times a day (QID) | ORAL | Status: DC | PRN

## 2016-01-02 MED ORDER — OXYCODONE-ACETAMINOPHEN 5-325 MG PO TABS: 1.0000 | ORAL_TABLET | Freq: Four times a day (QID) | ORAL | 0 refills | Status: DC | PRN

## 2016-01-02 MED ORDER — GABAPENTIN 300 MG PO CAPS
300.0000 mg | ORAL_CAPSULE | Freq: Three times a day (TID) | ORAL | Status: DC
  Administered 2016-01-02 – 2016-01-12 (×29): 300 mg via ORAL
  Filled 2016-01-02 (×29): qty 1

## 2016-01-02 MED ORDER — BISACODYL 5 MG PO TBEC: 5.0000 mg | DELAYED_RELEASE_TABLET | Freq: Every day | ORAL | 0 refills | Status: DC | PRN

## 2016-01-02 MED ORDER — PROCHLORPERAZINE 25 MG RE SUPP: 12.5000 mg | Freq: Four times a day (QID) | RECTAL | Status: DC | PRN

## 2016-01-02 MED ORDER — ENOXAPARIN SODIUM 40 MG/0.4ML ~~LOC~~ SOLN
40.0000 mg | SUBCUTANEOUS | Status: DC
  Administered 2016-01-02 – 2016-01-13 (×12): 40 mg via SUBCUTANEOUS
  Filled 2016-01-02 (×12): qty 0.4

## 2016-01-02 MED ORDER — GABAPENTIN 300 MG PO CAPS: 300.0000 mg | ORAL_CAPSULE | Freq: Three times a day (TID) | ORAL | 2 refills | Status: DC

## 2016-01-02 MED ORDER — MENTHOL 3 MG MT LOZG
1.0000 | LOZENGE | OROMUCOSAL | Status: DC | PRN
  Filled 2016-01-02: qty 9

## 2016-01-02 MED ORDER — ALBUTEROL SULFATE (2.5 MG/3ML) 0.083% IN NEBU: 2.5000 mg | INHALATION_SOLUTION | Freq: Four times a day (QID) | RESPIRATORY_TRACT | Status: DC | PRN

## 2016-01-02 MED ORDER — LOSARTAN POTASSIUM 50 MG PO TABS
100.0000 mg | ORAL_TABLET | Freq: Every day | ORAL | Status: DC
  Administered 2016-01-03 – 2016-01-14 (×12): 100 mg via ORAL
  Filled 2016-01-02 (×12): qty 2

## 2016-01-02 MED ORDER — DIPHENHYDRAMINE HCL 12.5 MG/5ML PO ELIX: 12.5000 mg | ORAL_SOLUTION | Freq: Four times a day (QID) | ORAL | Status: DC | PRN

## 2016-01-02 MED ORDER — PROCHLORPERAZINE EDISYLATE 5 MG/ML IJ SOLN: 5.0000 mg | Freq: Four times a day (QID) | INTRAMUSCULAR | Status: DC | PRN

## 2016-01-02 MED ORDER — ROSUVASTATIN CALCIUM 10 MG PO TABS
10.0000 mg | ORAL_TABLET | Freq: Every day | ORAL | Status: DC
  Administered 2016-01-02 – 2016-01-13 (×12): 10 mg via ORAL
  Filled 2016-01-02 (×12): qty 1

## 2016-01-02 MED ORDER — MOMETASONE FURO-FORMOTEROL FUM 200-5 MCG/ACT IN AERO
2.0000 | INHALATION_SPRAY | Freq: Two times a day (BID) | RESPIRATORY_TRACT | Status: DC
  Administered 2016-01-02 – 2016-01-14 (×20): 2 via RESPIRATORY_TRACT
  Filled 2016-01-02: qty 8.8

## 2016-01-02 MED ORDER — OXYCODONE HCL ER 10 MG PO T12A
20.0000 mg | EXTENDED_RELEASE_TABLET | Freq: Two times a day (BID) | ORAL | Status: DC
  Administered 2016-01-02 – 2016-01-04 (×5): 20 mg via ORAL
  Filled 2016-01-02 (×6): qty 2

## 2016-01-02 MED ORDER — DILTIAZEM HCL ER COATED BEADS 240 MG PO CP24
240.0000 mg | ORAL_CAPSULE | Freq: Every day | ORAL | Status: DC
  Administered 2016-01-03 – 2016-01-14 (×12): 240 mg via ORAL
  Filled 2016-01-02 (×12): qty 1

## 2016-01-02 MED ORDER — ALUM & MAG HYDROXIDE-SIMETH 200-200-20 MG/5ML PO SUSP
30.0000 mL | ORAL | Status: DC | PRN
  Administered 2016-01-07: 30 mL via ORAL
  Filled 2016-01-02: qty 30

## 2016-01-02 MED ORDER — GUAIFENESIN-DM 100-10 MG/5ML PO SYRP: 5.0000 mL | ORAL_SOLUTION | Freq: Four times a day (QID) | ORAL | Status: DC | PRN

## 2016-01-02 MED ORDER — FLEET ENEMA 7-19 GM/118ML RE ENEM: 1.0000 | ENEMA | Freq: Once | RECTAL | Status: DC | PRN

## 2016-01-02 MED ORDER — ACETAMINOPHEN 325 MG PO TABS
325.0000 mg | ORAL_TABLET | ORAL | Status: DC | PRN
  Administered 2016-01-10 – 2016-01-11 (×3): 650 mg via ORAL
  Filled 2016-01-02 (×4): qty 2

## 2016-01-02 MED ORDER — OXYCODONE HCL 5 MG PO TABS
5.0000 mg | ORAL_TABLET | ORAL | Status: DC | PRN
  Administered 2016-01-02: 5 mg via ORAL
  Administered 2016-01-03 – 2016-01-14 (×40): 10 mg via ORAL
  Filled 2016-01-02: qty 1
  Filled 2016-01-02 (×43): qty 2

## 2016-01-02 MED ORDER — TIOTROPIUM BROMIDE MONOHYDRATE 18 MCG IN CAPS
18.0000 ug | ORAL_CAPSULE | Freq: Every day | RESPIRATORY_TRACT | Status: DC
  Administered 2016-01-03 – 2016-01-14 (×11): 18 ug via RESPIRATORY_TRACT
  Filled 2016-01-02 (×2): qty 5

## 2016-01-02 MED ORDER — VITAMIN D 1000 UNITS PO TABS
1000.0000 [IU] | ORAL_TABLET | Freq: Every day | ORAL | Status: DC
  Administered 2016-01-03 – 2016-01-14 (×12): 1000 [IU] via ORAL
  Filled 2016-01-02 (×13): qty 1

## 2016-01-02 MED ORDER — POLYETHYLENE GLYCOL 3350 17 G PO PACK
17.0000 g | PACK | Freq: Two times a day (BID) | ORAL | Status: DC
  Administered 2016-01-03 – 2016-01-12 (×16): 17 g via ORAL
  Filled 2016-01-02 (×21): qty 1

## 2016-01-02 MED ORDER — TRAZODONE HCL 50 MG PO TABS
25.0000 mg | ORAL_TABLET | Freq: Every evening | ORAL | Status: DC | PRN
  Administered 2016-01-07: 25 mg via ORAL
  Administered 2016-01-08 – 2016-01-10 (×2): 50 mg via ORAL
  Filled 2016-01-02 (×3): qty 1

## 2016-01-02 MED ORDER — BISACODYL 10 MG RE SUPP
10.0000 mg | Freq: Every day | RECTAL | Status: DC | PRN
  Administered 2016-01-06 – 2016-01-13 (×2): 10 mg via RECTAL
  Filled 2016-01-02 (×2): qty 1

## 2016-01-02 MED ORDER — PHENOL 1.4 % MT LIQD: 1.0000 | OROMUCOSAL | Status: DC | PRN

## 2016-01-02 MED ORDER — PANTOPRAZOLE SODIUM 20 MG PO TBEC
20.0000 mg | DELAYED_RELEASE_TABLET | Freq: Every day | ORAL | Status: DC
Start: 2016-01-03 — End: 2016-01-14
  Administered 2016-01-03 – 2016-01-14 (×12): 20 mg via ORAL
  Filled 2016-01-02 (×12): qty 1

## 2016-01-02 MED ORDER — METHOCARBAMOL 750 MG PO TABS: 750.0000 mg | ORAL_TABLET | Freq: Four times a day (QID) | ORAL | 2 refills | Status: DC

## 2016-01-02 MED ORDER — METHOCARBAMOL 750 MG PO TABS
750.0000 mg | ORAL_TABLET | Freq: Four times a day (QID) | ORAL | Status: DC
  Administered 2016-01-02 – 2016-01-14 (×48): 750 mg via ORAL
  Filled 2016-01-02 (×48): qty 1

## 2016-01-02 MED ORDER — PROCHLORPERAZINE MALEATE 5 MG PO TABS: 5.0000 mg | ORAL_TABLET | Freq: Four times a day (QID) | ORAL | Status: DC | PRN

## 2016-01-02 MED ORDER — CELECOXIB 200 MG PO CAPS
200.0000 mg | ORAL_CAPSULE | Freq: Every day | ORAL | Status: DC
  Administered 2016-01-03 – 2016-01-14 (×12): 200 mg via ORAL
  Filled 2016-01-02 (×12): qty 1
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
## 2016-01-02 NOTE — Discharge Summary (Addendum)
Date of Admission: 12/30/2015  Date of Discharge: 01/02/16  Admission Diagnosis: Lumbosacral spondylolisthesis, lumbosacral spondylosis with radiculopathy, sagittal plane imbalance  Discharge Diagnosis: Same, chronic kidney disease stage 3  Procedure Performed: L2-3 lateral interbody fusion, L3 L4 lateral interbody fusion with release of anterior longitudinal ligament, dorsal internal fixation fusion L2 to L5  Attending: Truitt MerleBenjamin Jared Obie Silos, MD  Hospital Course:  The patient was admitted for the above listed operation and had an uncomplicated post-operative course.  They were discharged to rehab in stable condition.  Follow up: 3 weeks    Medication List    STOP taking these medications   naproxen 500 MG tablet Commonly known as:  NAPROSYN   traMADol 50 MG tablet Commonly known as:  ULTRAM     TAKE these medications   ADVAIR DISKUS 250-50 MCG/DOSE Aepb Generic drug:  Fluticasone-Salmeterol Inhale 1 puff into the lungs 2 (two) times daily.   albuterol 108 (90 Base) MCG/ACT inhaler Commonly known as:  PROVENTIL HFA;VENTOLIN HFA Inhale 1-2 puffs into the lungs every 6 (six) hours as needed for wheezing or shortness of breath.   alendronate 70 MG tablet Commonly known as:  FOSAMAX Take 70 mg by mouth every 7 (seven) days. Every Monday.[name].[name].[name].Take in the morning with a full glass of water, on an empty stomach, and do not take anything else by mouth or lie down for the next 30 min.   BC HEADACHE POWDER PO Take by mouth.   bisacodyl 5 MG EC tablet Commonly known as:  DULCOLAX Take 1 tablet (5 mg total) by mouth daily as needed for moderate constipation.   cholecalciferol 1000 units tablet Commonly known as:  VITAMIN D Take 1,000 Units by mouth daily.   diltiazem 240 MG 24 hr capsule Commonly known as:  CARDIZEM CD Take 240 mg by mouth daily.   docusate sodium 100 MG capsule Commonly known as:  COLACE Take 1 capsule (100 mg total) by mouth 2 (two) times daily.    furosemide 20 MG tablet Commonly known as:  LASIX Take 20 mg by mouth daily.   gabapentin 300 MG capsule Commonly known as:  NEURONTIN Take 1 capsule (300 mg total) by mouth 3 (three) times daily.   HYDROcodone-acetaminophen 5-325 MG tablet Commonly known as:  NORCO Take 1 tablet by mouth every 6 (six) hours as needed for moderate pain.   losartan 100 MG tablet Commonly known as:  COZAAR Take 100 mg by mouth daily.   methocarbamol 500 MG tablet Commonly known as:  ROBAXIN Take 1 tablet (500 mg total) by mouth 2 (two) times daily. What changed:  Another medication with the same name was added. Make sure you understand how and when to take each.   methocarbamol 750 MG tablet Commonly known as:  ROBAXIN Take 1 tablet (750 mg total) by mouth 4 (four) times daily. What changed:  You were already taking a medication with the same name, and this prescription was added. Make sure you understand how and when to take each.   montelukast 10 MG tablet Commonly known as:  SINGULAIR Take 10 mg by mouth at bedtime.   oxyCODONE-acetaminophen 5-325 MG tablet Commonly known as:  ROXICET Take 1-2 tablets by mouth every 6 (six) hours as needed for severe pain.   rosuvastatin 10 MG tablet Commonly known as:  CRESTOR Take 10 mg by mouth daily.   tiotropium 18 MCG inhalation capsule Commonly known as:  SPIRIVA Place 18 mcg into inhaler and inhale daily.

## 2016-01-02 NOTE — Care Management Note (Signed)
Case Management Note  Patient Details  Name: [name] MRN: [id number] Date of Birth: 1945/12/26  Subjective/Objective:                    Action/Plan: Pt discharging to CIR today. No further needs per CM.   Expected Discharge Date:                  Expected Discharge Plan:  IP Rehab Facility  In-House Referral:     Discharge planning Services  CM Consult  Post Acute Care Choice:    Choice offered to:     DME Arranged:    DME Agency:     HH Arranged:    HH Agency:     Status of Service:  Completed, signed off  If discussed at [employer] Length of Stay Meetings, dates discussed:    Additional Comments:  Kermit BaloKelli F De Jaworski, RN 01/02/2016, 11:35 AM

## 2016-01-02 NOTE — Clinical Social Work Note (Signed)
CSW consulted for New SNF. CSW reviewed chart and discussed pt with RN, RNCM, and Consulting civil engineerCharge RN. PT is recommending CIR. Pt will discharge to CIR today. CSW is signing off as no further needs identifefid.   Dede QuerySarah Maxen Rowland, MSW, LCSW  Clinical Social Worker [phone]
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
## 2016-01-02 NOTE — Care Management Important Message (Signed)
Important Message  Patient Details  Name: [name] MRN: [id number] Date of Birth: Dec 01, 1945   Medicare Important Message Given:  Yes    Marija Calamari Stefan ChurchBratton 01/02/2016, 12:59 PM

## 2016-01-02 NOTE — Progress Notes (Addendum)
No acute events Doing well Moving legs well Incisions look good To IP rehab today Stable chronic kidney disease stage3

## 2016-01-02 NOTE — Progress Notes (Signed)
Physical Therapy Treatment Patient Details Name: [name] MRN: [id number] DOB: 06/26/1945 Today's Date: 01/02/2016    History of Present Illness 70 yo female s/p L 2-3 L3-4 XLIF PMH: arthritis, htn, PNA, tobacco abuse, asthma, laminectomy fusion L4-5 L5-S1 R hand, R knee, R VATS.    PT Comments    Progressing well though still with lots of pain and needing lots of education combined with lots of therapy.   Follow Up Recommendations  CIR;Other (comment) (pt needs further rehab and can handle intensity of CIR)     Equipment Recommendations  Rolling walker with 5" wheels    Recommendations for Other Services       Precautions / Restrictions Precautions Precautions: Back;Fall Precaution Booklet Issued: Yes (comment) Required Braces or Orthoses: Spinal Brace Spinal Brace: Thoracolumbosacral orthotic;Applied in sitting position Restrictions Weight Bearing Restrictions: No    Mobility  Bed Mobility Overal bed mobility: Needs Assistance Bed Mobility: Rolling;Sidelying to Sit Rolling: Min assist Sidelying to sit: Mod assist       General bed mobility comments: cues for safety and sequence   Transfers Overall transfer level: Needs assistance Equipment used: Rolling walker (2 wheeled) Transfers: Sit to/from Stand Sit to Stand: Min assist         General transfer comment: cues for hand placement  Ambulation/Gait Ambulation/Gait assistance: Min assist Ambulation Distance (Feet): 130 Feet Assistive device: Rolling walker (2 wheeled) Gait Pattern/deviations: Step-through pattern Gait velocity: slow Gait velocity interpretation: Below normal speed for age/gender General Gait Details: short, increasingly tremulous steps   Stairs            Wheelchair Mobility    Modified Rankin (Stroke Patients Only)       Balance Overall balance assessment: Needs assistance   Sitting balance-Leahy Scale: Fair Sitting balance - Comments: can handle to push/pull  of getting the TLSO on , but unable to do it herself.     Standing balance-Leahy Scale: Poor                      Cognition Arousal/Alertness: Awake/alert Behavior During Therapy: WFL for tasks assessed/performed Overall Cognitive Status: Within Functional Limits for tasks assessed                      Exercises      General Comments General comments (skin integrity, edema, etc.): Reinforced bed mobility, safety techniques and progression of activity.  Helped pt understand what to expect on rehab.      Pertinent Vitals/Pain Pain Assessment: 0-10 Pain Score: 9  Pain Location: back down R LE Pain Descriptors / Indicators: Burning;Sore Pain Intervention(s): Monitored during session;Repositioned;Premedicated before session    Home Living                      Prior Function            PT Goals (current goals can now be found in the care plan section) Acute Rehab PT Goals PT Goal Formulation: With patient Time For Goal Achievement: 01/07/16 Potential to Achieve Goals: Good Progress towards PT goals: Progressing toward goals    Frequency  Min 5X/week    PT Plan Discharge plan needs to be updated    Co-evaluation             End of Session Equipment Utilized During Treatment: Back brace Activity Tolerance: Patient limited by fatigue Patient left: in chair;with call bell/phone within reach     Time: [id number]  PT Time Calculation (min) (ACUTE ONLY): 27 min  Charges:  $Gait Training: 8-22 mins $Therapeutic Activity: 8-22 mins                    G Codes:      Peyten Punches, Eliseo GumKenneth V 01/02/2016, 10:34 AM 01/02/2016  [place] BingKen Zalika Tieszen, PT [phone]  (pager)

## 2016-01-02 NOTE — Progress Notes (Signed)
Patient is being transferred to rehab, report called to the receiving nurse.

## 2016-01-03 ENCOUNTER — Inpatient Hospital Stay (HOSPITAL_COMMUNITY): Payer: Medicare Other | Admitting: Occupational Therapy

## 2016-01-03 ENCOUNTER — Inpatient Hospital Stay (HOSPITAL_COMMUNITY): Payer: Medicare Other | Admitting: Physical Therapy

## 2016-01-03 LAB — CBC WITH DIFFERENTIAL/PLATELET
BASOS PCT: 0 %
Basophils Absolute: 0 10*3/uL (ref 0.0–0.1)
EOS ABS: 0.3 10*3/uL (ref 0.0–0.7)
EOS PCT: 3 %
HCT: 26.9 % — ABNORMAL LOW (ref 36.0–46.0)
HEMOGLOBIN: 8.4 g/dL — AB (ref 12.0–15.0)
LYMPHS ABS: 2.2 10*3/uL (ref 0.7–4.0)
Lymphocytes Relative: 20 %
MCH: 30.9 pg (ref 26.0–34.0)
MCHC: 31.2 g/dL (ref 30.0–36.0)
MCV: 98.9 fL (ref 78.0–100.0)
Monocytes Absolute: 0.8 10*3/uL (ref 0.1–1.0)
Monocytes Relative: 7 %
NEUTROS PCT: 70 %
Neutro Abs: 7.6 10*3/uL (ref 1.7–7.7)
PLATELETS: 168 10*3/uL (ref 150–400)
RBC: 2.72 MIL/uL — AB (ref 3.87–5.11)
RDW: 14.9 % (ref 11.5–15.5)
WBC: 10.9 10*3/uL — AB (ref 4.0–10.5)

## 2016-01-03 LAB — COMPREHENSIVE METABOLIC PANEL
ALBUMIN: 2.9 g/dL — AB (ref 3.5–5.0)
ALK PHOS: 39 U/L (ref 38–126)
ALT: 16 U/L (ref 14–54)
ANION GAP: 8 (ref 5–15)
AST: 41 U/L (ref 15–41)
BUN: 31 mg/dL — ABNORMAL HIGH (ref 6–20)
CHLORIDE: 114 mmol/L — AB (ref 101–111)
CO2: 24 mmol/L (ref 22–32)
Calcium: 10.5 mg/dL — ABNORMAL HIGH (ref 8.9–10.3)
Creatinine, Ser: 1.52 mg/dL — ABNORMAL HIGH (ref 0.44–1.00)
GFR calc non Af Amer: 34 mL/min — ABNORMAL LOW (ref >60)
GFR, EST AFRICAN AMERICAN: 40 mL/min — AB (ref >60)
GLUCOSE: 96 mg/dL (ref 65–99)
Potassium: 3.9 mmol/L (ref 3.5–5.1)
SODIUM: 146 mmol/L — AB (ref 135–145)
Total Bilirubin: 0.5 mg/dL (ref 0.3–1.2)
Total Protein: 5.9 g/dL — ABNORMAL LOW (ref 6.5–8.1)

## 2016-01-03 MED ORDER — OXYCODONE HCL 5 MG PO TABS
10.0000 mg | ORAL_TABLET | Freq: Two times a day (BID) | ORAL | Status: DC
  Administered 2016-01-03: 10 mg via ORAL
  Filled 2016-01-03: qty 2

## 2016-01-03 MED ORDER — OXYCODONE HCL 5 MG PO TABS
15.0000 mg | ORAL_TABLET | Freq: Two times a day (BID) | ORAL | Status: DC
  Administered 2016-01-04 – 2016-01-10 (×12): 15 mg via ORAL
  Filled 2016-01-03 (×12): qty 3

## 2016-01-03 MED ORDER — OXYCODONE HCL 5 MG PO TABS: 10.0000 mg | ORAL_TABLET | Freq: Two times a day (BID) | ORAL | Status: DC

## 2016-01-03 NOTE — Progress Notes (Signed)
Patient information reviewed and entered into eRehab system by Thora Scherman, RN, CRRN, PPS Coordinator.  Information including medical coding and functional independence measure will be reviewed and updated through discharge.     Per nursing patient was given "Data Collection Information Summary for Patients in Inpatient Rehabilitation Facilities with attached "Privacy Act Statement-Health Care Records" upon admission.  

## 2016-01-03 NOTE — Progress Notes (Signed)
[place] PHYSICAL MEDICINE & REHABILITATION     PROGRESS NOTE    Subjective/Complaints: Pt states she had an awful night. "i didn't get my meds like I should. I couldn't sleep because I hurt." RN reports patient given all meds.   ROS: +anxiety.  Pt denies fever, rash/itching, headache, blurred or double vision, nausea, vomiting, abdominal pain, diarrhea, chest pain, shortness of breath, palpitations, dysuria, dizziness,  , bleeding,   or depression   Objective: Vital Signs: Blood pressure (!) 147/69, pulse (!) 121, temperature 99.9 F (37.7 C), temperature source Oral, resp. rate 18, height 5\' 5"  (1.651 m), weight 69.3 kg (152 lb 12.5 oz), SpO2 96 %. No results found.  Recent Labs  01/03/16 0544  WBC 10.9*  HGB 8.4*  HCT 26.9*  PLT 168    Recent Labs  01/03/16 0544  NA 146*  K 3.9  CL 114*  GLUCOSE 96  BUN 31*  CREATININE 1.52*  CALCIUM 10.5*   CBG (last 3)  No results for input(s): GLUCAP in the last 72 hours.  Wt Readings from Last 3 Encounters:  01/02/16 69.3 kg (152 lb 12.5 oz)  12/30/15 66.1 kg (145 lb 11.6 oz)  12/27/15 61.8 kg (136 lb 4.8 oz)    Physical Exam:  Constitutional: no distress. Sitting eob HENT:  Head: Normocephalic and atraumatic.  Mouth/Throat: Oropharynx is clear and moist.  Eyes: Conjunctivae and EOM are normal. Pupils are equal, round, and reactive to light.  Neck: Normal range of motion. Neck supple.  Cardiovascular: Normal rate and regular rhythm.   No murmur heard. Respiratory: Effort normal and breath sounds normal. No stridor. No respiratory distress. She has no wheezes.  GI: Soft. Bowel sounds are normal. She exhibits distension. There is no tenderness.  Musculoskeletal: She exhibits no edema or tenderness.  Neurological: She is alert and oriented to person, place, and time. STM memory deficits.  Motor strength is 3 minus bilateral hip flexor, knee extensor, ankle dorsiflexor. Some of this is pain related, difficult to say  whether this is true weakness. Upper extremity strength is 4/5, bilateral deltoid, biceps, triceps, grip Skin: Skin is warm and dry.  Lower back and right flank incisions with dry honeycomb dressing--wound dry and well approximated. Psychiatric: very anxious. Motor strength is 3 minus bilateral hip flexor, knee extensor, ankle dorsiflexor. Some of this is pain related, difficult to say whether this is true weakness. Upper extremity strength is 4/5, bilateral deltoid, biceps, triceps, grip   Assessment/Plan: 1. 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 Physiatrist is providing close team supervision and 24 hour management of active medical problems listed below. Physiatrist and rehab team continue to assess barriers to discharge/monitor patient progress toward functional and medical goals.  Function:  Bathing Bathing position   Position: Shower  Bathing parts Body parts bathed by patient: Right arm, Left arm, Chest, Abdomen, Front perineal area, Buttocks, Right upper leg, Left upper leg Body parts bathed by helper: Right lower leg, Left lower leg, Back  Bathing assist Assist Level: Touching or steadying assistance(Pt > 75%)      Upper Body Dressing/Undressing Upper body dressing   What is the patient wearing?: Bra, Pull over shirt/dress Bra - Perfomed by patient: Thread/unthread right bra strap, Thread/unthread left bra strap Bra - Perfomed by helper: Hook/unhook bra (pull down sports bra) Pull over shirt/dress - Perfomed by patient: Thread/unthread right  sleeve, Thread/unthread left sleeve, Put head through opening, Pull shirt over trunk (Needing VC for sequencing and assist zipping dress)          Upper body assist Assist Level: Touching or steadying assistance(Pt > 75%)      Lower Body  Dressing/Undressing Lower body dressing   What is the patient wearing?: Underwear, Non-skid slipper socks Underwear - Performed by patient: Pull underwear up/down Underwear - Performed by helper: Thread/unthread right underwear leg, Thread/unthread left underwear leg       Non-skid slipper socks- Performed by helper: Don/doff right sock, Don/doff left sock                  Lower body assist Assist for lower body dressing: Touching or steadying assistance (Pt > 75%)      Toileting Toileting   Toileting steps completed by patient: Adjust clothing prior to toileting, Performs perineal hygiene, Adjust clothing after toileting      Toileting assist Assist level: Touching or steadying assistance (Pt.75%) (For balance in standing)   Transfers Chair/bed transfer   Chair/bed transfer method: Ambulatory Chair/bed transfer assist level: Touching or steadying assistance (Pt > 75%)       Locomotion Ambulation           Wheelchair          Cognition Comprehension Comprehension assist level: Understands basic 75 - 89% of the time/ requires cueing 10 - 24% of the time  Expression Expression assist level: Expresses complex 90% of the time/cues < 10% of the time  Social Interaction Social Interaction assist level: Interacts appropriately with others - No medications needed.  Problem Solving Problem solving assist level: Solves basic 50 - 74% of the time/requires cueing 25 - 49% of the time  Memory Memory assist level: Recognizes or recalls 25 - 49% of the time/requires cueing 50 - 75% of the time  Medical Problem List and Plan: 1.  Paraparesis secondary to lumbar spinal stenosis and spondylolisthesis status post L2-3, L3-4 lumbar decompression and fusion 12/16/2068  -begin CIR therapies 2.  DVT Prophylaxis/Anticoagulation: Pharmaceutical: Lovenox 3. Pain Management:  On Oxycontin BID with celebrex bid and prn oxycodone for breakthrough pain.  On gabapentin for neuropathy.   -pt  needs assistance recalling when meds are given  -recommend keeping a written log of pain meds in room so that she's aware she has received meds 4. Mood: Team to provide ego support. LCSW to follow for evaluation and support.   -check sleep chart 5. Neuropsych: This patient is capable of making decisions on her own behalf. 6. Skin/Wound Care: Monitor back wound daily for healing 7. Fluids/Electrolytes/Nutrition:   -BUN elevated  -encourage fluids  -recheck bmet tomorrow 8. HTN: Monitor BP bid. Will hold lasix due to hypotension. Continue Cardizem with parameters. .  10 COPD/Asthma:  Encourage IS with fluter valve. Respiratory status stable on Dulera, Spiriva and Singulair.  11. CKD:   Avoid nephrotoxic medications.   -recheck BMET in morning  LOS (Days) 1 A FACE TO FACE EVALUATION WAS PERFORMED  SWARTZ,ZACHARY T 01/03/2016 11:10 AM

## 2016-01-03 NOTE — Progress Notes (Addendum)
Occupational Therapy Session Note  Patient Details  Name: [name] MRN: [id number] Date of Birth: 1945/06/14  Today's Date: 01/03/2016 OT Individual Time: 1305-1400 OT Individual Time Calculation (min): 55 min     Short Term Goals: Week 1:  OT Short Term Goal 1 (Week 1): Pt will recall 3/3 back precautions with min VC OT Short Term Goal 2 (Week 1): Pt will complete LB dressing using AE with VC for technique OT Short Term Goal 3 (Week 1): Pt will perform tub transfer with min A for balance  OT Short Term Goal 4 (Week 1): Pt will maintain back precautions during functional task with min VC  Skilled Therapeutic Interventions/Progress Updates: Balance/vestibular training;Discharge planning;Pain management;Self Care/advanced ADL retraining;Therapeutic Activities;Cognitive remediation/compensation;Disease mangement/prevention;Functional mobility training;Patient/family education;Therapeutic Exercise;Visual/perceptual remediation/compensation;Community reintegration;DME/adaptive equipment instruction;Psychosocial support;UE/LE Strength taining/ROM   Pt participated in bed mobility, functional mobility around room and unit with RW, incorporating back precautions in ADL tasks, sequence and functional problem solving, dynamic standing balance and toileting. Pt required total multimodal cuing to maintain back precautions after coming to EOB from supine and in prep for donning brace - with total A. Pt unable to recall or demonstrate back precautions. Performed toileting with steadying A. Pt ambulated to the gym with steadying A with RW with one episode of knees buckling requiring min A to maintain balance. One seated rest break by elevators on the way to the gym.  Engaged in building simple pipe tree pictures in standing on foam and able to maintain balance with close supervision to steadying A. Pt required total A to select the correct piece and assemble due to decr sequencing and functional problem  solving. Return to room with RW with steadying A and into bed with mod A for assistance with bilateral LEs. Left in bed with call light. Pt then reports hallucinations of papers flying in her room- reported to RN.  Therapy Documentation Precautions:  Precautions Precautions: Back, Fall Precaution Booklet Issued: Yes (comment) Precaution Comments: Pt with decreased awareness of precautions Required Braces or Orthoses: Spinal Brace Spinal Brace: Thoracolumbosacral orthotic, Applied in sitting position Restrictions Weight Bearing Restrictions: No General:   Vital Signs: Therapy Vitals Temp: 98.8 F (37.1 C) Temp Source: Oral Pulse Rate: (!) 114 Resp: 18 BP: 100/67 Patient Position (if appropriate): Lying Oxygen Therapy SpO2: 94 % O2 Device: Not Delivered Pain: Pain Assessment Pain Assessment: 0-10 Pain Score: 5  Faces Pain Scale: No hurt Pain Type: Acute pain Pain Location: Back Pain Orientation: Mid;Lower Pain Descriptors / Indicators: Aching;Shooting Pain Frequency: Constant Pain Onset: On-going Patients Stated Pain Goal: 4 Pain Intervention(s): Medication (See eMAR)  See Function Navigator for Current Functional Status.   Therapy/Group: Individual Therapy  [name], [name] [facility] 01/03/2016, 1:15 PM

## 2016-01-03 NOTE — Care Management Note (Signed)
Inpatient Rehabilitation Center Individual Statement of Services  Patient Name:  [name]  Date:  01/03/2016  Welcome to the Inpatient Rehabilitation Center.  Our goal is to provide you with an individualized program based on your diagnosis and situation, designed to meet your specific needs.  With this comprehensive rehabilitation program, you will be expected to participate in at least 3 hours of rehabilitation therapies Monday-Friday, with modified therapy programming on the weekends.  Your rehabilitation program will include the following services:  Physical Therapy (PT), Occupational Therapy (OT), Speech Therapy (ST), 24 hour per day rehabilitation nursing, Therapeutic Recreaction (TR), Neuropsychology, Case Management (Social Worker), Rehabilitation Medicine, Nutrition Services and Pharmacy Services  Weekly team conferences will be held on Tuesdays to discuss your progress.  Your Social Worker will talk with you frequently to get your input and to update you on team discussions.  Team conferences with you and your family in attendance may also be held.  Expected length of stay: 10-12 days  Overall anticipated outcome: supervision  Depending on your progress and recovery, your program may change. Your Social Worker will coordinate services and will keep you informed of any changes. Your Social Worker's name and contact numbers are listed  below.  The following services may also be recommended but are not provided by the Inpatient Rehabilitation Center:   Driving Evaluations  Home Health Rehabiltiation Services  Outpatient Rehabilitation Services  Vocational Rehabilitation   Arrangements will be made to provide these services after discharge if needed.  Arrangements include referral to agencies that provide these services.  Your insurance has been verified to be:  Medicare and Medicaid Your primary doctor is:  Dr. Sharyn LullHarwani  Pertinent information will be shared with your  doctor and your insurance company.  Social Worker:  [place]la[place]cy Miesha Bachmann, [place] [phone] or ([phone]) [phone]   Information discussed with and copy given to patient by: Amada JupiterHOYLE, Katina Remick, 01/03/2016, 1:33 PM
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
## 2016-01-03 NOTE — IPOC Note (Addendum)
Overall Plan of Care [facility](IPOC) Patient Details Name: Amalia Haileyoni A Nyquist MRN: [id number] DOB: 1945-08-25  Admitting Diagnosis: Lumbar Fusion  Hospital Problems: Active Problems:   Spondylolisthesis of lumbar region     Functional Problem List: Nursing Bladder, Bowel, Edema, Endurance, Medication Management, Pain, Safety, Skin Integrity  PT Balance, Behavior, Perception, Safety, Endurance, Motor, Pain  OT Balance, Cognition, Endurance, Motor, Pain, Safety  SLP Cognition  TR         Basic ADL's: OT Grooming, Bathing, Dressing, Toileting     Advanced  ADL's: OT Simple Meal Preparation, Light Housekeeping     Transfers: PT Bed Mobility, Bed to Chair, Set designerCar, Oncologisturniture  OT Toilet     Locomotion: PT Ambulation, Stairs     Additional Impairments: OT    SLP Social Cognition   Social Interaction, Problem Solving, Memory, Attention, Awareness  TR      Anticipated Outcomes Item Anticipated Outcome  Self Feeding Mod I  Swallowing      Basic self-care  Mod I-Supervision  Toileting  Mod I    Bathroom Transfers Mod I-Supervision   Bowel/Bladder  continent of bowel andbladder with min assist  Transfers  Mod I  Locomotion  S with LRAD  Communication     Cognition  supervision for basic  Pain  pain less than or equal to 4 on a scale of 1-10 with min assist  Safety/Judgment  free from falls/injury and displaying sound safety judgement   Therapy Plan: PT Intensity: Minimum of 1-2 x/day ,45 to 90 minutes PT Frequency: 5 out of 7 days PT Duration Estimated Length of Stay: 10-12 days OT Intensity: Minimum of 1-2 x/day, 45 to 90 minutes OT Frequency: 5 out of 7 days OT Duration/Estimated Length of Stay: 10-12 days pending cognition SLP Intensity: Minumum of 1-2 x/day, 30 to 90 minutes SLP Frequency: 3 to 5 out of 7 days SLP Duration/Estimated Length of Stay: 10-12 days       Team Interventions: Nursing Interventions Patient/Family Education, Bladder Management, Bowel  Management, Disease Management/Prevention, Pain Management, Medication Management, Skin Care/Wound Management, Discharge Planning  PT interventions Ambulation/gait training, Cognitive remediation/compensation, Discharge planning, Disease management/prevention, Community reintegration, Warden/rangerBalance/vestibular training, Fish farm managerDME/adaptive equipment instruction, Functional mobility training, Neuromuscular re-education, Patient/family education, Pain management, Stair training, Therapeutic Activities, Splinting/orthotics, Therapeutic Exercise, UE/LE Strength taining/ROM, UE/LE Coordination activities, Wheelchair propulsion/positioning, Visual/perceptual remediation/compensation  OT Interventions Balance/vestibular training, Discharge planning, Pain management, Self Care/advanced ADL retraining, Therapeutic Activities, Cognitive remediation/compensation, Disease mangement/prevention, Functional mobility training, Patient/family education, Therapeutic Exercise, Visual/perceptual remediation/compensation, Community reintegration, Fish farm managerDME/adaptive equipment instruction, Psychosocial support, UE/LE Strength taining/ROM  SLP Interventions Cognitive remediation/compensation, Internal/external aids, Financial traderCueing hierarchy, Patient/family education, Functional tasks  TR Interventions    SW/CM Interventions Discharge Planning, Psychosocial Support, Patient/Family Education    Team Discharge Planning: Destination: PT-Skilled Nursing Facility (SNF) (Family unable to provide 24/7) ,OT- Home , SLP-Home Projected Follow-up: PT-24 hour supervision/assistance, Home health PT, OT-  24 hour supervision/assistance (due to donning/doffing back brace), SLP-24 hour supervision/assistance (pending progress) Projected Equipment Needs: PT-To be determined, OT- To be determined, SLP-None recommended by SLP Equipment Details: PT- , OT-  Patient/family involved in discharge planning: PT- Patient,  OT-Patient, SLP-Patient  MD ELOS: 10-12 days Medical  Rehab Prognosis:  Excellent Assessment: The patient has been admitted for CIR therapies with the diagnosis of lumbar stenosis/spondylolisthesis s/p decompression and fusion. The team will be addressing functional mobility, strength, stamina, balance, safety, adaptive techniques and equipment, self-care, bowel and bladder mgt, patient and caregiver education, back precautions, pain control, memory deficits, anxiety,  community reintegration, brace don/dof. Goals have been set at mod I to supervision. Short term memory deficits are a concern, however they may be related to narcotics.    Ranelle OysterZachary T. Swartz, MD, FAAPMR      See Team Conference Notes for weekly updates to the plan of care

## 2016-01-04 ENCOUNTER — Inpatient Hospital Stay (HOSPITAL_COMMUNITY): Payer: Medicare Other

## 2016-01-04 ENCOUNTER — Inpatient Hospital Stay (HOSPITAL_COMMUNITY): Payer: Medicare Other | Admitting: Speech Pathology

## 2016-01-04 ENCOUNTER — Inpatient Hospital Stay (HOSPITAL_COMMUNITY): Payer: Medicare Other | Admitting: Physical Therapy

## 2016-01-04 DIAGNOSIS — N189 Chronic kidney disease, unspecified: Secondary | ICD-10-CM

## 2016-01-04 LAB — BASIC METABOLIC PANEL
Anion gap: 8 (ref 5–15)
BUN: 25 mg/dL — AB (ref 6–20)
CHLORIDE: 114 mmol/L — AB (ref 101–111)
CO2: 24 mmol/L (ref 22–32)
CREATININE: 1.27 mg/dL — AB (ref 0.44–1.00)
Calcium: 10.5 mg/dL — ABNORMAL HIGH (ref 8.9–10.3)
GFR calc non Af Amer: 42 mL/min — ABNORMAL LOW (ref >60)
GFR, EST AFRICAN AMERICAN: 49 mL/min — AB (ref >60)
GLUCOSE: 109 mg/dL — AB (ref 65–99)
Potassium: 4.6 mmol/L (ref 3.5–5.1)
Sodium: 146 mmol/L — ABNORMAL HIGH (ref 135–145)

## 2016-01-04 NOTE — Progress Notes (Signed)
Occupational Therapy Session Note  Patient Details  Name: [name] A Schey MRN: [id number] Date of Birth: [birth year]  Today's Date: 01/04/2016 OT Individual Time: [id number] OT Individual Time Calculation (min): 60 min     Short Term Goals: Week 1:  OT Short Term Goal 1 (Week 1): Pt will recall 3/3 back precautions with min VC OT Short Term Goal 2 (Week 1): Pt will complete LB dressing using AE with VC for technique OT Short Term Goal 3 (Week 1): Pt will perform tub transfer with min A for balance  OT Short Term Goal 4 (Week 1): Pt will maintain back precautions during functional task with min VC  Skilled Therapeutic Interventions/Progress Updates:    OT session focused on safety awareness, cognition, education, bed mobility, functional transfers, and recall of back precautions. Pt demonstrated tangential speech throughout session and poor sustained attention to topic. She demonstrated increased awareness of cognitive deficits, resulting in high levels of anxiety. OT re-educated on injury, rehab process, goals, etc. Pt also verbalizing high levels of pain, however not consistent with presentation. RN provided pain meds during session. Pt verbalized 2/3 back precautions at beginning of session, however decreased carryover noted as she recalled 1/3 precautions 2x later in session. Practiced sit<>stand 5x with no UE and min A for balance. Completed bed mobility 3x with focus on recall of precautions during functional task. Pt completed bed mobility with min A and moderate verbal cues for log rolling technique. At end of session, pt left sitting in w/c with all needs in reach. Therapy Documentation Precautions:  Precautions Precautions: Back, Fall Precaution Booklet Issued: Yes (comment) Precaution Comments: Pt with decreased awareness of precautions Required Braces or Orthoses: Spinal Brace Spinal Brace: Thoracolumbosacral orthotic, Applied in sitting position Restrictions Weight Bearing  Restrictions: No General:   Vital Signs: Therapy Vitals BP: 103/76 Pain:   ADL:   Exercises:   Other Treatments:    See Function Navigator for Current Functional Status.   Therapy/Group: Individual Therapy  Daneil Danerkinson, Yancy Knoble N 01/04/2016, 11:10 AM
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
## 2016-01-04 NOTE — Progress Notes (Signed)
Vital signs review with Dr. Fritzi MandesKirsten.  Instructed to proceed with Cardiazem and Losartan doses as ordered.

## 2016-01-04 NOTE — Progress Notes (Signed)
Physical Therapy Session Note  Patient Details  Name: [name] MRN: [id number] Date of Birth: 01/04/1946  Today's Date: 01/04/2016 PT Individual Time: 0800-0915 PT Individual Time Calculation (min): 75 min    Short Term Goals: Week 1:  PT Short Term Goal 1 (Week 1): =LTG due to estimated LOS  Skilled Therapeutic Interventions/Progress Updates:   Pt received seated in bed, c/o 8/10 back pain and agreeable to treatment. Asks therapist "what medicine are you going to give me?"; therapist informed pt of therapist's role and that RN would likely be by soon to administer medications if they hadn't been already. Supine>sit from flat bed with bedrails and S. Seated on EOB therapist donned B socks, pt donned shoes with setupA, and therapist fastened shoes. Stand pivot transfer bed>>w/c min guard. Assessed berg and TUG as described below with both tests indicating high risk for falls. On initial trial of TUG, performed in 1 min 50 seconds due to slow speed, poor problem solving when turning around cone, and cueing to line up with chair prior to sitting to prevent fall. Second trial with tennis balls added to RW to improve ease of rolling and reduce energy expenditure, and demonstrated to pt again how to perform turn around cone, as well as discussed safety prior to sitting and ensuring pt is lined up with chair; much improved second trial with time 38 seconds. Continues to demonstrate short shuffled steps and absence of heel strike. Gait x100', 210' with RW and close S/min guard. Requires cues for visual scanning on environment with pt frequently running into walls, objects in gym; reports she is trying to stand tall but looks up toward ceiling instead of straight ahead. Gait speed assessed at 1.1 ft/sec, also indicating high risk for recurrent falls and limited to household ambulation. Stairs 1x12 on 6" steps with B handrails; max multimodal cueing for pt to understand and demonstrate reciprocal stepping,  however ultimately able to perform with min guard overall. Gait to return to room with RW and min guard; required max cues to read sign and identify which direction room was. Remained seated in w/c at end of session, all needs in reach and handoff to OT.   Therapy Documentation Precautions:  Precautions Precautions: Back, Fall Precaution Booklet Issued: Yes (comment) Precaution Comments: Pt with decreased awareness of precautions Required Braces or Orthoses: Spinal Brace Spinal Brace: Thoracolumbosacral orthotic, Applied in sitting position Restrictions Weight Bearing Restrictions: No Pain: Pain Assessment Pain Assessment: 0-10 Pain Score: Asleep Pain Type: Acute pain Pain Location: Back Pain Orientation: Mid;Lower Pain Descriptors / Indicators: Aching Pain Frequency: Intermittent Pain Onset: Awakened from sleep Pain Intervention(s): Medication (See eMAR) Balance: Standardized Balance Assessment Standardized Balance Assessment: Berg Balance Test;Timed Up and Go Test Berg Balance Test Sit to Stand: Able to stand  independently using hands Standing Unsupported: Able to stand 2 minutes with supervision Sitting with Back Unsupported but Feet Supported on Floor or Stool: Able to sit safely and securely 2 minutes Stand to Sit: Controls descent by using hands Transfers: Able to transfer with verbal cueing and /or supervision Standing Unsupported with Eyes Closed: Able to stand 10 seconds with supervision Standing Ubsupported with Feet Together: Able to place feet together independently and stand for 1 minute with supervision From Standing, Reach Forward with Outstretched Arm: Reaches forward but needs supervision From Standing Position, Pick up Object from Floor: Unable to try/needs assist to keep balance (NT d/t back precautions) From Standing Position, Turn to Look Behind Over each Shoulder: Needs supervision  when turning Turn 360 Degrees: Able to turn 360 degrees safely but  slowly Standing Unsupported, Alternately Place Feet on Step/Stool: Able to complete >2 steps/needs minimal assist Standing Unsupported, One Foot in Front: Able to plae foot ahead of the other independently and hold 30 seconds Standing on One Leg: Tries to lift leg/unable to hold 3 seconds but remains standing independently Total Score: 30 Timed Up and Go Test TUG: Normal TUG Normal TUG (seconds): 38   See Function Navigator for Current Functional Status.   Therapy/Group: Individual Therapy  Vista Lawmanlizabeth J Tygielski 01/04/2016, 9:09 AM

## 2016-01-04 NOTE — Progress Notes (Signed)
[place] PHYSICAL MEDICINE & REHABILITATION     PROGRESS NOTE    Subjective/Complaints: Seen in PT, participating well but "tenses up" with every movement  ROS: +anxiety.  Pt denies fever, rash/itching, headache, b, nausea, vomiting, abdominal pain, diarrhea, chest pain, shortness of breath, palpitations, dysuria, dizziness,    Objective: Vital Signs: Blood pressure 106/66, pulse (!) 111, temperature 98.8 F (37.1 C), temperature source Oral, resp. rate 17, height 5\' 5"  (1.651 m), weight 69.3 kg (152 lb 12.5 oz), SpO2 95 %. No results found.  Recent Labs  01/03/16 0544  WBC 10.9*  HGB 8.4*  HCT 26.9*  PLT 168    Recent Labs  01/03/16 0544 01/04/16 0550  NA 146* 146*  K 3.9 4.6  CL 114* 114*  GLUCOSE 96 109*  BUN 31* 25*  CREATININE 1.52* 1.27*  CALCIUM 10.5* 10.5*   CBG (last 3)  No results for input(s): GLUCAP in the last 72 hours.  Wt Readings from Last 3 Encounters:  01/02/16 69.3 kg (152 lb 12.5 oz)  12/30/15 66.1 kg (145 lb 11.6 oz)  12/27/15 61.8 kg (136 lb 4.8 oz)    Physical Exam:  Constitutional: no distress. Sitting eob HENT:  Head: Normocephalic and atraumatic.  Mouth/Throat: Oropharynx is clear and moist.  Eyes: Conjunctivae and EOM are normal. Pupils are equal, round, and reactive to light.  Neck: Normal range of motion. Neck supple.  Cardiovascular: Normal rate and regular rhythm.   No murmur heard. Respiratory: Effort normal and breath sounds normal. No stridor. No respiratory distress. She has no wheezes.  GI: Soft. Bowel sounds are normal. She exhibits distension. There is no tenderness.  Musculoskeletal: She exhibits no edema or tenderness.  Neurological: She is alert and oriented to person, place, and time. STM memory deficits.  Motor strength is 3 minus bilateral hip flexor, knee extensor, ankle dorsiflexor. Some of this is pain related, difficult to say whether this is true weakness. Upper extremity strength is 4/5, bilateral  deltoid, biceps, triceps, grip Skin: Skin is warm and dry.  Lower back and right flank incisions with dry honeycomb dressing--wound dry and well approximated. Psychiatric: very anxious. Motor strength is 3 minus bilateral hip flexor, knee extensor, ankle dorsiflexor. Some of this is pain related, difficult to say whether this is true weakness. Upper extremity strength is 4/5, bilateral deltoid, biceps, triceps, grip   Assessment/Plan: 1. 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 Physiatrist is providing close team supervision and 24 hour management of active medical problems listed below. Physiatrist and rehab team continue to assess barriers to discharge/monitor patient progress toward functional and medical goals.  Function:  Bathing Bathing position   Position: Shower  Bathing parts Body parts bathed by patient: Right arm, Left arm, Chest, Abdomen, Front perineal area, Buttocks, Right upper leg, Left upper leg Body parts bathed by helper: Right lower leg, Left lower leg, Back  Bathing assist Assist Level: Touching or steadying assistance(Pt > 75%)      Upper Body Dressing/Undressing Upper body dressing   What is the patient wearing?: Bra, Pull over shirt/dress, Orthosis Bra - Perfomed by patient: Thread/unthread right bra strap, Thread/unthread left bra strap Bra - Perfomed by helper: Hook/unhook bra (pull down sports bra) Pull over shirt/dress - Perfomed by patient: Thread/unthread right sleeve, Thread/unthread left sleeve, Put head through opening, Pull shirt over trunk (Needing VC for sequencing and  assist zipping dress)       Orthosis activity level: Performed by helper  Upper body assist Assist Level: Touching or steadying assistance(Pt > 75%)      Lower Body Dressing/Undressing Lower  body dressing   What is the patient wearing?: Underwear, Non-skid slipper socks Underwear - Performed by patient: Pull underwear up/down Underwear - Performed by helper: Thread/unthread right underwear leg, Thread/unthread left underwear leg       Non-skid slipper socks- Performed by helper: Don/doff right sock, Don/doff left sock                  Lower body assist Assist for lower body dressing: Touching or steadying assistance (Pt > 75%)      Toileting Toileting   Toileting steps completed by patient: Adjust clothing prior to toileting, Performs perineal hygiene, Adjust clothing after toileting   Toileting Assistive Devices: Grab bar or rail  Toileting assist Assist level: Touching or steadying assistance (Pt.75%)   Transfers Chair/bed transfer   Chair/bed transfer method: Stand pivot Chair/bed transfer assist level: Touching or steadying assistance (Pt > 75%) Chair/bed transfer assistive device: Armrests     Locomotion Ambulation     Max distance: 200 Assist level: Touching or steadying assistance (Pt > 75%)   Wheelchair          Cognition Comprehension Comprehension assist level: Understands basic 50 - 74% of the time/ requires cueing 25 - 49% of the time  Expression Expression assist level: Expresses complex 90% of the time/cues < 10% of the time  Social Interaction Social Interaction assist level: Interacts appropriately 50 - 74% of the time - May be physically or verbally inappropriate.  Problem Solving Problem solving assist level: Solves basic less than 25% of the time - needs direction nearly all the time or does not effectively solve problems and may need a restraint for safety  Memory Memory assist level: Recognizes or recalls less than 25% of the time/requires cueing greater than 75% of the time  Medical Problem List and Plan: 1.  Paraparesis secondary to lumbar spinal stenosis and spondylolisthesis status post L2-3, L3-4 lumbar decompression and fusion  12/16/2068  -Cont CIR PT, OT 2.  DVT Prophylaxis/Anticoagulation: Pharmaceutical: Lovenox 3. Pain Management:  On Oxycontin BID with celebrex bid and prn oxycodone for breakthrough pain.  On gabapentin for neuropathy.   -pt needs assistance recalling when meds are given  -recommend keeping a written log of pain meds in room so that she's aware she has received meds 4. Mood: Team to provide ego support. LCSW to follow for evaluation and support.   -check sleep chart 5. Neuropsych: This patient is capable of making decisions on her own behalf. 6. Skin/Wound Care: Monitor back wound daily for healing 7. Fluids/Electrolytes/Nutrition:   -BUN elevated  -encourage fluids  -recheck bmet Improved creat 8. HTN: Monitor BP bid. Will hold lasix due to hypotension. Continue Cardizem with parameters. .  10 COPD/Asthma:  Encourage IS with fluter valve. Respiratory status stable on Dulera, Spiriva and Singulair.  11. CKD:   Avoid nephrotoxic medications.stabilizing   LOS (Days) 2 A FACE TO FACE EVALUATION WAS PERFORMED  Erick ColaceKIRSTEINS,Bethanee Redondo E 01/04/2016 8:52 AM

## 2016-01-05 ENCOUNTER — Inpatient Hospital Stay (HOSPITAL_COMMUNITY): Payer: Medicare Other | Admitting: Physical Therapy

## 2016-01-05 ENCOUNTER — Inpatient Hospital Stay (HOSPITAL_COMMUNITY): Payer: Medicare Other | Admitting: Occupational Therapy

## 2016-01-05 DIAGNOSIS — G8918 Other acute postprocedural pain: Secondary | ICD-10-CM

## 2016-01-05 LAB — URINALYSIS, ROUTINE W REFLEX MICROSCOPIC
BILIRUBIN URINE: NEGATIVE
GLUCOSE, UA: NEGATIVE mg/dL
Hgb urine dipstick: NEGATIVE
KETONES UR: NEGATIVE mg/dL
LEUKOCYTES UA: NEGATIVE
Nitrite: NEGATIVE
PH: 5.5 (ref 5.0–8.0)
PROTEIN: NEGATIVE mg/dL
Specific Gravity, Urine: 1.019 (ref 1.005–1.030)

## 2016-01-05 NOTE — Progress Notes (Signed)
[place] PHYSICAL MEDICINE & REHABILITATION     PROGRESS NOTE    Subjective/Complaints: Discussed with RN, pt confused, some "auditory hallucinations" last noc  ROS: +anxiety.  Pt denies fever, rash/itching, headache,nausea, vomiting, abdominal pain, diarrhea, chest pain, shortness of breath, palpitations, dysuria, dizziness,    Objective: Vital Signs: Blood pressure 130/73, pulse (!) 129, temperature 99.8 F (37.7 C), temperature source Oral, resp. rate 17, height 5\' 5"  (1.651 m), weight 69.3 kg (152 lb 12.5 oz), SpO2 91 %. No results found.  Recent Labs  01/03/16 0544  WBC 10.9*  HGB 8.4*  HCT 26.9*  PLT 168    Recent Labs  01/03/16 0544 01/04/16 0550  NA 146* 146*  K 3.9 4.6  CL 114* 114*  GLUCOSE 96 109*  BUN 31* 25*  CREATININE 1.52* 1.27*  CALCIUM 10.5* 10.5*   CBG (last 3)  No results for input(s): GLUCAP in the last 72 hours.  Wt Readings from Last 3 Encounters:  01/02/16 69.3 kg (152 lb 12.5 oz)  12/30/15 66.1 kg (145 lb 11.6 oz)  12/27/15 61.8 kg (136 lb 4.8 oz)    Physical Exam:  Constitutional: no distress. Sitting eob HENT:  Head: Normocephalic and atraumatic.  Mouth/Throat: Oropharynx is clear and moist.  Eyes: Conjunctivae and EOM are normal. Pupils are equal, round, and reactive to light.  Neck: Normal range of motion. Neck supple.  Cardiovascular: Normal rate and regular rhythm.   No murmur heard. Respiratory: Effort normal and breath sounds normal. No stridor. No respiratory distress. She has no wheezes.  GI: Soft. Bowel sounds are normal.  There is no tenderness.  Musculoskeletal: She exhibits no edema or tenderness.  Neurological: She is alert and oriented to person, place, and time. STM memory deficits.  Motor strength is 3 minus bilateral hip flexor, knee extensor, ankle dorsiflexor. Some of this is pain related, difficult to say whether this is true weakness.  Skin: Skin is warm and dry.   Psychiatric: very anxious. Motor  strength is4 minus bilateral hip flexor, knee extensor, ankle dorsiflexor. Some of this is pain related, difficult to say whether this is true weakness. Upper extremity strength is 4/5, bilateral deltoid, biceps, triceps, grip   Assessment/Plan: 1. 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 Physiatrist is providing close team supervision and 24 hour management of active medical problems listed below. Physiatrist and rehab team continue to assess barriers to discharge/monitor patient progress toward functional and medical goals.  Function:  Bathing Bathing position   Position: Shower  Bathing parts Body parts bathed by patient: Right arm, Left arm, Chest, Abdomen, Front perineal area, Buttocks, Right upper leg, Left upper leg Body parts bathed by helper: Right lower leg, Left lower leg, Back  Bathing assist Assist Level: Touching or steadying assistance(Pt > 75%)      Upper Body Dressing/Undressing Upper body dressing   What is the patient wearing?: Bra, Pull over shirt/dress, Orthosis Bra - Perfomed by patient: Thread/unthread right bra strap, Thread/unthread left bra strap Bra - Perfomed by helper: Hook/unhook bra (pull down sports bra) Pull over shirt/dress - Perfomed by patient: Thread/unthread right sleeve, Thread/unthread left sleeve, Put head through opening, Pull shirt over trunk (Needing VC for sequencing and assist zipping dress)       Orthosis activity level: Performed by helper  Upper body assist Assist Level: Touching or steadying assistance(Pt > 75%)  Lower Body Dressing/Undressing Lower body dressing   What is the patient wearing?: Socks, Shoes Underwear - Performed by patient: Pull underwear up/down Underwear - Performed by helper: Thread/unthread right underwear leg,  Thread/unthread left underwear leg       Non-skid slipper socks- Performed by helper: Don/doff right sock, Don/doff left sock   Socks - Performed by helper: Don/doff right sock, Don/doff left sock Shoes - Performed by patient: Don/doff right shoe, Don/doff left shoe Shoes - Performed by helper: Fasten right, Fasten left          Lower body assist Assist for lower body dressing: Touching or steadying assistance (Pt > 75%)      Toileting Toileting   Toileting steps completed by patient: Adjust clothing prior to toileting, Performs perineal hygiene, Adjust clothing after toileting   Toileting Assistive Devices: Grab bar or rail  Toileting assist Assist level: Touching or steadying assistance (Pt.75%)   Transfers Chair/bed transfer   Chair/bed transfer method: Stand pivot Chair/bed transfer assist level: Touching or steadying assistance (Pt > 75%) Chair/bed transfer assistive device: Armrests     Locomotion Ambulation     Max distance: 210 Assist level: Touching or steadying assistance (Pt > 75%)   Wheelchair          Cognition Comprehension Comprehension assist level: Understands basic 50 - 74% of the time/ requires cueing 25 - 49% of the time  Expression Expression assist level: Expresses complex 90% of the time/cues < 10% of the time  Social Interaction Social Interaction assist level: Interacts appropriately 50 - 74% of the time - May be physically or verbally inappropriate.  Problem Solving Problem solving assist level: Solves basic less than 25% of the time - needs direction nearly all the time or does not effectively solve problems and may need a restraint for safety  Memory Memory assist level: Recognizes or recalls less than 25% of the time/requires cueing greater than 75% of the time  Medical Problem List and Plan: 1.  Paraparesis secondary to lumbar spinal stenosis and spondylolisthesis status post L2-3, L3-4 lumbar decompression and fusion 12/16/2068  -Cont CIR  PT, OT 2.  DVT Prophylaxis/Anticoagulation: Pharmaceutical: Lovenox 3. Pain Management: Mental status changes likely related to escalated narcotics, D/C Oxycontin BID with celebrex bid and prn oxycodone for breakthrough pain.  On gabapentin for neuropathy.   -pt needs assistance recalling when meds are given  4. Mood: Team to provide ego support. LCSW to follow for evaluation and support.   -check sleep chart 5. Neuropsych: This patient is capable of making decisions on her own behalf. 6. Skin/Wound Care: Monitor back wound daily for healing 7. Fluids/Electrolytes/Nutrition:   -BUN elevated  -encourage fluids  -recheck bmet Improved creat 8. HTN: Monitor BP bid. Will hold lasix due to hypotension. Continue Cardizem with parameters. .  10 COPD/Asthma:  Encourage IS with fluter valve. Respiratory status stable on Dulera, Spiriva and Singulair.  11. CKD:   Avoid nephrotoxic medications.stabilizing   LOS (Days) 3 A FACE TO FACE EVALUATION WAS PERFORMED  Umer Harig E 01/05/2016 9:05 AM

## 2016-01-05 NOTE — Progress Notes (Signed)
Occupational Therapy Session Note  Patient Details  Name: [name] MRN: [id number] Date of Birth: 05/01/1946  Today's Date: 01/05/2016 OT Individual Time:  -  0800-0900  (60 min)       Short Term Goals: Week 1:  OT Short Term Goal 1 (Week 1): Pt will recall 3/3 back precautions with min VC OT Short Term Goal 2 (Week 1): Pt will complete LB dressing using AE with VC for technique OT Short Term Goal 3 (Week 1): Pt will perform tub transfer with min A for balance  OT Short Term Goal 4 (Week 1): Pt will maintain back precautions during functional task with min VC     Skilled Therapeutic Interventions/Progress Updates:    OT session focused on safety awareness, cognition, education, bed mobility, functional transfers, and recall of back precautions. Pt not oriented to time, but knew sitituation.   She demonstrated increased awareness of cognitive deficits.    Ppt recalled 1/3 back precautions.  Ppt went from supine to sit with supervision.  Donned brace with min assist.   Decreased carryover noted  throuout the session.  Transferred to wc to tub transfer bench with min assist.    Pt  tangential speech throughout session and poor sustained attention to topic. A t end of session, pt left sitting in w/c with all needs in reach. Therapy Documentation Therapy Documentation Precautions:  Precautions Precautions: Back, Fall Precaution Booklet Issued: Yes (comment) Precaution Comments: Pt with decreased awareness of precautions Required Braces or Orthoses: Spinal Brace Spinal Brace: Thoracolumbosacral orthotic, Applied in sitting position Restrictions Weight Bearing Restrictions: No General:   Vital Signs: Therapy Vitals Temp: 99.8 F (37.7 C) Temp Source: Oral Pulse Rate: (!) 129 Resp: 17 BP: 130/73 Patient Position (if appropriate): Lying Oxygen Therapy SpO2: 91 % O2 Device: Not Delivered Pain: Pain Assessment Pain Assessment: No/denies pain Pain Score: 10-Worst pain  ever Pain Type: Acute pain Pain Location: Back Pain Orientation: Lower Pain Descriptors / Indicators: Aching Pain Frequency: Constant Pain Onset: On-going Pain Intervention(s): Medication (See eMAR)        See Function Navigator for Current Functional Status.   Therapy/Group: Individual Therapy  Humberto Sealsdwards, [name] 01/05/2016, 7:51 AM

## 2016-01-06 ENCOUNTER — Inpatient Hospital Stay (HOSPITAL_COMMUNITY): Payer: Medicare Other | Admitting: Occupational Therapy

## 2016-01-06 ENCOUNTER — Inpatient Hospital Stay (HOSPITAL_COMMUNITY): Payer: Medicare Other | Admitting: Physical Therapy

## 2016-01-06 NOTE — Plan of Care (Signed)
Problem: RH PAIN MANAGEMENT Goal: RH STG PAIN MANAGED AT OR BELOW PT'S PAIN GOAL Pain less than 5 on a scale form 0-10  Outcome: Not Progressing Rates pain a 10 or higher while awake

## 2016-01-06 NOTE — Progress Notes (Signed)
[place] PHYSICAL MEDICINE & REHABILITATION     PROGRESS NOTE    Subjective/Complaints: Feeling well. Like hot pack---really helped back pain. Slept well.   ROS: +anxiety.  Pt denies fever, rash/itching, headache,nausea, vomiting, abdominal pain, diarrhea, chest pain, shortness of breath, palpitations, dysuria, dizziness,    Objective: Vital Signs: Blood pressure 116/73, pulse (!) 105, temperature 98.7 F (37.1 C), temperature source Oral, resp. rate 18, height 5\' 5"  (1.651 m), weight 69.3 kg (152 lb 12.5 oz), SpO2 100 %. No results found. No results for input(s): WBC, HGB, HCT, PLT in the last 72 hours.  Recent Labs  01/04/16 0550  NA 146*  K 4.6  CL 114*  GLUCOSE 109*  BUN 25*  CREATININE 1.27*  CALCIUM 10.5*   CBG (last 3)  No results for input(s): GLUCAP in the last 72 hours.  Wt Readings from Last 3 Encounters:  01/02/16 69.3 kg (152 lb 12.5 oz)  12/30/15 66.1 kg (145 lb 11.6 oz)  12/27/15 61.8 kg (136 lb 4.8 oz)    Physical Exam:  Constitutional: no distress. Sitting eob HENT:  Head: Normocephalic and atraumatic.  Mouth/Throat: Oropharynx is clear and moist.  Eyes: Conjunctivae and EOM are normal. Pupils are equal, round, and reactive to light.  Neck: Normal range of motion. Neck supple.  Cardiovascular: Normal rate and regular rhythm.   No murmur heard. Respiratory: Effort normal and breath sounds normal. No stridor. No respiratory distress. She has no wheezes.  GI: Soft. Bowel sounds are normal.  There is no tenderness.  Musculoskeletal: She exhibits no edema or tenderness.  Neurological: She is alert and oriented to person, place, and time. STM memory deficits. Tangential.  Motor strength is 3 minus bilateral hip flexor, knee extensor, ankle dorsiflexor. Some of this is pain related, difficult to say whether this is true weakness.  Skin: Skin is warm and dry. Incisions clean/dry  Psychiatric: very pleasant and cooperative. Can be redirected.      Assessment/Plan: 1. 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 Physiatrist is providing close team supervision and 24 hour management of active medical problems listed below. Physiatrist and rehab team continue to assess barriers to discharge/monitor patient progress toward functional and medical goals.  Function:  Bathing Bathing position   Position: Shower  Bathing parts Body parts bathed by patient: Right arm, Left arm, Chest, Abdomen, Front perineal area, Buttocks, Right upper leg, Left upper leg Body parts bathed by helper: Right lower leg, Left lower leg, Back  Bathing assist Assist Level: Touching or steadying assistance(Pt > 75%)      Upper Body Dressing/Undressing Upper body dressing   What is the patient wearing?: Bra, Pull over shirt/dress, Orthosis Bra - Perfomed by patient: Thread/unthread right bra strap, Thread/unthread left bra strap Bra - Perfomed by helper: Hook/unhook bra (pull down sports bra) Pull over shirt/dress - Perfomed by patient: Thread/unthread right sleeve, Thread/unthread left sleeve, Put head through opening, Pull shirt over trunk       Orthosis activity level: Performed by helper  Upper body assist Assist Level: Touching or steadying assistance(Pt > 75%)      Lower Body Dressing/Undressing Lower body dressing   What is the patient wearing?: Socks, Shoes Underwear - Performed by patient: Pull underwear up/down Underwear - Performed by helper: Thread/unthread right underwear leg, Thread/unthread left underwear leg       Non-skid slipper socks-  Performed by helper: Don/doff right sock, Don/doff left sock   Socks - Performed by helper: Don/doff right sock, Don/doff left sock Shoes - Performed by patient: Don/doff right shoe, Don/doff left shoe Shoes -  Performed by helper: Fasten right, Fasten left          Lower body assist Assist for lower body dressing: Touching or steadying assistance (Pt > 75%)      Toileting Toileting   Toileting steps completed by patient: Adjust clothing prior to toileting, Performs perineal hygiene, Adjust clothing after toileting   Toileting Assistive Devices: Grab bar or rail  Toileting assist Assist level: Touching or steadying assistance (Pt.75%)   Transfers Chair/bed transfer   Chair/bed transfer method: Stand pivot Chair/bed transfer assist level: Touching or steadying assistance (Pt > 75%) Chair/bed transfer assistive device: Armrests     Locomotion Ambulation     Max distance: 210 Assist level: Touching or steadying assistance (Pt > 75%)   Wheelchair          Cognition Comprehension Comprehension assist level: Understands basic 50 - 74% of the time/ requires cueing 25 - 49% of the time  Expression Expression assist level: Expresses basic 75 - 89% of the time/requires cueing 10 - 24% of the time. Needs helper to occlude trach/needs to repeat words.  Social Interaction Social Interaction assist level: Interacts appropriately with others - No medications needed.  Problem Solving Problem solving assist level: Solves basic less than 25% of the time - needs direction nearly all the time or does not effectively solve problems and may need a restraint for safety  Memory Memory assist level: Recognizes or recalls 50 - 74% of the time/requires cueing 25 - 49% of the time  Medical Problem List and Plan: 1.  Paraparesis secondary to lumbar spinal stenosis and spondylolisthesis status post L2-3, L3-4 lumbar decompression and fusion 12/16/2068  -Cont CIR PT, OT 2.  DVT Prophylaxis/Anticoagulation: Pharmaceutical: Lovenox 3. Pain Management: Mental status changes likely related to escalated narcotics, D/C'ed Oxycontin BID with celebrex bid and prn oxycodone for breakthrough pain.  On gabapentin for  neuropathy.   -pt needs assistance recalling when meds are given  -ordered kpad---would prefer to use this as oppose to oxycodone 4. Mood: Team to provide ego support. LCSW to follow for evaluation and support.   -  sleep chart 5. Neuropsych: This patient is capable of making decisions on her own behalf.  -some improvement with decreased narcs? 6. Skin/Wound Care: Monitor back wound daily for healing 7. Fluids/Electrolytes/Nutrition:   -BUN elevated  -encourage fluids  -following labs 8. HTN: Monitor BP bid. Will hold lasix due to hypotension. Continue Cardizem with parameters. .  10 COPD/Asthma:  Encourage IS with fluter valve. Respiratory status stable on Dulera, Spiriva and Singulair.  11. CKD:   Avoid nephrotoxic medications.stabilizing   LOS (Days) 4 A FACE TO FACE EVALUATION WAS PERFORMED  [name] T 01/06/2016 9:15 AM

## 2016-01-06 NOTE — Progress Notes (Signed)
Physical Therapy Session Note  Patient Details  Name: [name] A Cariker MRN: [id number] Date of Birth: February 20, 1946  Today's Date: 01/06/2016 PT Individual Time: 0830-0930 and 1300-1400 PT Individual Time Calculation (min): 60 min and 60 min (total 120 min)    Short Term Goals: Week 1:  PT Short Term Goal 1 (Week 1): =LTG due to estimated LOS  Skilled Therapeutic Interventions/Progress Updates:    Tx1:Pt received supine in bed, c/o back pain 10/10 however minimal facial expressions/vocalizations consistent with severe pain noted during session; agreeable to treatment. Supine>sit with logroll; mod verbal cues for maintaining back precautions. Pt repetitively tries to don TLSO without any clothes on, and is hesitant when therapist informs her she can/should put clothes on before donning brace. TEDs donned to BLE after noted edema in BLE ankles; RN informed. Pt dons shoes with setup, requires assist for fastening. minA for threading RLE into shorts/underwear d/t difficulty leaning forward. Stand pivot transfer bed>w/c with no AD; min guard. Gait x250' with no AD and min guard; repetitive cues for normalized step length/speed and reduced tension in UEs with pt reporting anxiety with walking; occasional mild LOBs especially when pt talking/singing d/t cognitive dual task. Pt distracts herself easily during mobility tasks and unaware of how it affects her balance. Sideways and backwards walking with light UE support on rail in hall. Total 3 sets 15 reps heel raises between trials of sideways/backward walking. Returned to room in w/c totalA; remained seated in w/c with quick release belt intact and all needs in reach at end of session.   Tx 2: Pt received supine in bed, c/o 10/10 pain and perseverative on receiving pain medication. Bed mobility with S and min cues for maintaining back precautions. TLSO donned totalA by therapist. Stand pivot transfer w/c with close S. Pt continues to perseverate and become  agitated d/t not receiving pain medications; RN alerted and arrived quickly to dispense medications. Pt became irritated with her sister and told her to leave; then became perseverative on her sister and their relationship and required repetitive max cueing to redirect to therapy session. Gait x100' with RW and S; max cues for attention to breathing d/t increase in breath holding and observable anxiety, improved with cueing. W/c propulsion with BUE for strengthening and coordination; mod cues for technique and sequencing. Returned to room and transferred to bed with S stand pivot. Pt attempted to doff brace without assist, however had difficulty reaching top two straps. Demonstrated to pt use of brace once it had been removed to help pt visualize process of donning/doffing. Remained supine in bed with alarm intact and all needs in reach.   Therapy Documentation Precautions:  Precautions Precautions: Back, Fall Precaution Booklet Issued: Yes (comment) Precaution Comments: Pt with decreased awareness of precautions Required Braces or Orthoses: Spinal Brace Spinal Brace: Thoracolumbosacral orthotic, Applied in sitting position Restrictions Weight Bearing Restrictions: No   See Function Navigator for Current Functional Status.   Therapy/Group: Individual Therapy  Vista Lawmanlizabeth J Tygielski 01/06/2016, 9:52 AM

## 2016-01-06 NOTE — Progress Notes (Signed)
D. Angulli, PA and Dr. Allena KatzPatel informed of sister's concerns regarding mental status/behavoirs, agitation, hallucinations (visual and auditory), changes in medications and Hx of tobacco abuse.

## 2016-01-06 NOTE — Progress Notes (Signed)
Occupational Therapy Session Note  Patient Details  Name: [name] A Gomm MRN: [id number] Date of Birth: Sep 25, 1945  Today's Date: 01/06/2016 OT Individual Time: 1100-1200 OT Individual Time Calculation (min): 60 min     Short Term Goals:Week 1:  OT Short Term Goal 1 (Week 1): Pt will recall 3/3 back precautions with min VC OT Short Term Goal 2 (Week 1): Pt will complete LB dressing using AE with VC for technique OT Short Term Goal 3 (Week 1): Pt will perform tub transfer with min A for balance  OT Short Term Goal 4 (Week 1): Pt will maintain back precautions during functional task with min VC  Skilled Therapeutic Interventions/Progress Updates:    Pt seen for OT session focusing on functional mobility, spinal pre-cautions, and transfers. Pt in supine upon arrival, voicing increased pain in back. RN already aware and medications had previously been administered. Pt perseverative on pain throughout session, being hyper-verbal throughout hour long session and becoming slightly agitated with therapist with attempts at re-direction.  TLSO donned total A. She ambulated to ADL apartment using RW with CGA. Attempted verbal, visual, and demonstrational cuing for step pattern during walking as pt with short shuffling walk, however, pt unable to follow cues and when attempting to follow, pt actually at higher fall risk. Significantly increased time required to walk to ADL apartment as pt would stop to talk about pain.  In ADL apartment, pt completed sit <> stand from low soft surface couch, requiring assist for controlled descent and mod A to stand.  Pt with difficulty describing home bathroom set-up, however, trialed transfer on tub bench. However, pt cont with persevartive talk and refused to cont due to pain and "not a priority at this time".  Pt returned to room at end of session, placed in supine with heat pack and all needs in reach, bed alarm on.   Therapy Documentation Precautions:   Precautions Precautions: Back, Fall Precaution Booklet Issued: Yes (comment) Precaution Comments: Pt with decreased awareness of precautions Required Braces or Orthoses: Spinal Brace Spinal Brace: Thoracolumbosacral orthotic, Applied in sitting position Restrictions Weight Bearing Restrictions: No Pain: Pain Assessment Pain Score: 10-Worst pain ever Pain Type: Acute pain Pain Location: Back Pain Descriptors / Indicators: Aching Pain Intervention(s): RN made aware;Heat applied;Ambulation/increased activity;Distraction  See Function Navigator for Current Functional Status.   Therapy/Group: Individual Therapy  Lewis, Elhadj Girton C 01/06/2016, 7:18 AM

## 2016-01-06 NOTE — Progress Notes (Addendum)
Occupational Therapy Session Note  Patient Details  Name: [name] MRN: [id number] Date of Birth: 12/29/1945  Today's Date: 01/06/2016 OT Individual Time: 1456-1540 OT Individual Time Calculation (min): 44 min     Short Term Goals: Week 1:  OT Short Term Goal 1 (Week 1): Pt will recall 3/3 back precautions with min VC OT Short Term Goal 2 (Week 1): Pt will complete LB dressing using AE with VC for technique OT Short Term Goal 3 (Week 1): Pt will perform tub transfer with min A for balance  OT Short Term Goal 4 (Week 1): Pt will maintain back precautions during functional task with min VC  Skilled Therapeutic Interventions/Progress Updates:   Pt participated in skilled OT session focusing on activity tolerance, adherence to back precautions, and pain mgt. Upon skilled OT arrival, pt reported feeling pain but being medicated prior to session. Pt reported prayer as being a method she used to self mgt pain. Pt agreed to complete prayer writing task in standing. Supine<sit completed with close supervision with cues for log roll technique. Back brace donned at EOB with Max A. Pt attempted to stand without brace and was educated on importance of wearing brace when standing for safety. Max vcs required for adhering to precautions. Pt completed prayer writing with RW alternating between standing and sitting at EOB with close supervision. Longest standing time without rest or LOB 5 minutes 4 seconds. No c/o pain during task. At end of session, pt reported feeling fatigued and wanted to return to bed. Pt completed sit<supine with supervision for logroll. Nursing notified of pts request for hot pack and medication. Bed alarm activated and 3 bedrails up at time of skilled OT departure.    Therapy Documentation Precautions:  Precautions Precautions: Back, Fall Precaution Booklet Issued: Yes (comment) Precaution Comments: Pt with decreased awareness of precautions Required Braces or Orthoses: Spinal  Brace Spinal Brace: Thoracolumbosacral orthotic, Applied in sitting position Restrictions Weight Bearing Restrictions: No  Pain: Pain Assessment Pain Score: 5  ADL:      See Function Navigator for Current Functional Status.   Therapy/Group: Individual Therapy  [name] 01/06/2016, 7:28 PM

## 2016-01-06 NOTE — Progress Notes (Signed)
Recreational Therapy Session Note  Patient Details  Name: [name] MRN: [id number] Date of Birth: 1945/12/11 Today's Date: 01/06/2016  Pain: c/o 10/10 back pain, premedicated- discussed with RN, encouraged deep breathing Skilled Therapeutic Interventions/Progress Updates: Initiated leisure screen with pt at bed level.  Pt c/o uncontrolled back pain, restless in bed and difficult to redirect.  Discussion with pts nurse about pt complaints, RN aware and stated that she was premedicated prior to session with multiple medicines which should help reduce pain. Pt further stated that she had a strong faith that helped her through tough times.  Encouraged pt to use her faith/prayer to help with further pain control.  Full eval deferred at this time.  Will continue to monitor through team for future participation.  Saturnino Liew 01/06/2016, 3:43 PM

## 2016-01-07 ENCOUNTER — Inpatient Hospital Stay (HOSPITAL_COMMUNITY): Payer: Medicare Other | Admitting: Occupational Therapy

## 2016-01-07 ENCOUNTER — Inpatient Hospital Stay (HOSPITAL_COMMUNITY): Payer: Medicare Other | Admitting: Physical Therapy

## 2016-01-07 ENCOUNTER — Inpatient Hospital Stay (HOSPITAL_COMMUNITY): Payer: Medicare Other | Admitting: Speech Pathology

## 2016-01-07 NOTE — Progress Notes (Signed)
Pt yelling out saying "why are you guys doing this to me? I'm waking up to you guys yelling at me. Why? I'm a good person, don't do this to me" RN tried to distract and orient patient. Pt repositioned, Kpad applied, was already given pain meds (see MAR), and bed alarm set. Will continue to monitor. Royston CowperIsley, Briana Newman E, RN

## 2016-01-07 NOTE — Progress Notes (Signed)
[facility]     PROGRESS NOTE    Subjective/Complaints: kpad helpful after being re-positioned. Back still stiff. meds help. Moved bowels last night  ROS: +anxiety.  Pt denies fever, rash/itching, headache,nausea, vomiting, abdominal pain, diarrhea, chest pain, shortness of breath, palpitations, dysuria, dizziness,    Objective: Vital Signs: Blood pressure 113/79, pulse (!) 103, temperature 98.6 F (37 C), temperature source Oral, resp. rate 18, height 5\' 5"  (1.651 m), weight 69.3 kg (152 lb 12.5 oz), SpO2 98 %. No results found. No results for input(s): WBC, HGB, HCT, PLT in the last 72 hours. No results for input(s): NA, K, CL, GLUCOSE, BUN, CREATININE, CALCIUM in the last 72 hours.  Invalid input(s): CO CBG (last 3)  No results for input(s): GLUCAP in the last 72 hours.  Wt Readings from Last 3 Encounters:  01/02/16 69.3 kg (152 lb 12.5 oz)  12/30/15 66.1 kg (145 lb 11.6 oz)  12/27/15 61.8 kg (136 lb 4.8 oz)    Physical Exam:  Constitutional: no distress. Sitting eob HENT:  Head: Normocephalic and atraumatic.  Mouth/Throat: Oropharynx is clear and moist.  Eyes: Conjunctivae and EOM are normal. Pupils are equal, round, and reactive to light.  Neck: Normal range of motion. Neck supple.  Cardiovascular: Normal rate and regular rhythm.   No murmur heard. Respiratory: Effort normal and breath sounds normal. No stridor. No respiratory distress. She has no wheezes.  GI: Soft. Bowel sounds are normal.  There is no tenderness.  Musculoskeletal: She exhibits no edema or tenderness.  Neurological: She is alert and oriented to person, place, and time. STM memory deficits. Tangential.  Motor strength is 3 minus bilateral hip flexor, knee extensor, ankle dorsiflexor. Some of this is pain related, difficult to say whether this is true weakness.  Skin: Skin is warm and dry. Incisions clean/dry  Psychiatric: very pleasant and cooperative. Extremely  tangential.     Assessment/Plan: 1. 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 Physiatrist is providing close team supervision and 24 hour management of active medical problems listed below. Physiatrist and rehab team continue to assess barriers to discharge/monitor patient progress toward functional and medical goals.  Function:  Bathing Bathing position   Position: Shower  Bathing parts Body parts bathed by patient: Right arm, Left arm, Chest, Abdomen, Front perineal area, Buttocks, Right upper leg, Left upper leg Body parts bathed by helper: Right lower leg, Left lower leg, Back  Bathing assist Assist Level: Touching or steadying assistance(Pt > 75%)      Upper Body Dressing/Undressing Upper body dressing   What is the patient wearing?: Bra, Pull over shirt/dress, Orthosis Bra - Perfomed by patient: Thread/unthread right bra strap, Thread/unthread left bra strap, Hook/unhook bra (pull down sports bra) Bra - Perfomed by helper: Hook/unhook bra (pull down sports bra) Pull over shirt/dress - Perfomed by patient: Thread/unthread right sleeve, Thread/unthread left sleeve, Put head through opening, Pull shirt over trunk       Orthosis activity level: Performed by helper  Upper body assist Assist Level: Touching or steadying assistance(Pt > 75%)      Lower Body Dressing/Undressing Lower body dressing   What is the patient wearing?: Socks, Shoes, Pants Underwear - Performed by patient: Pull underwear up/down Underwear - Performed by helper: Thread/unthread right underwear leg, Thread/unthread left underwear leg, Pull underwear up/down Pants- Performed by patient: Thread/unthread right  pants leg, Thread/unthread left pants leg, Pull pants up/down     Non-skid slipper socks- Performed by helper:  Don/doff right sock, Don/doff left sock   Socks - Performed by helper: Don/doff right sock, Don/doff left sock Shoes - Performed by patient: Don/doff right shoe, Don/doff left shoe Shoes - Performed by helper: Fasten right, Fasten left          Lower body assist Assist for lower body dressing: Touching or steadying assistance (Pt > 75%)      Toileting Toileting   Toileting steps completed by patient: Adjust clothing prior to toileting, Performs perineal hygiene, Adjust clothing after toileting   Toileting Assistive Devices: Grab bar or rail  Toileting assist Assist level: Touching or steadying assistance (Pt.75%)   Transfers Chair/bed transfer   Chair/bed transfer method: Stand pivot Chair/bed transfer assist level: Supervision or verbal cues Chair/bed transfer assistive device: Armrests     Locomotion Ambulation     Max distance: 220 Assist level: Touching or steadying assistance (Pt > 75%)   Wheelchair          Cognition Comprehension Comprehension assist level: Understands basic 50 - 74% of the time/ requires cueing 25 - 49% of the time  Expression Expression assist level: Expresses basic 75 - 89% of the time/requires cueing 10 - 24% of the time. Needs helper to occlude trach/needs to repeat words.  Social Interaction Social Interaction assist level: Interacts appropriately 75 - 89% of the time - Needs redirection for appropriate language or to initiate interaction.  Problem Solving Problem solving assist level: Solves basic less than 25% of the time - needs direction nearly all the time or does not effectively solve problems and may need a restraint for safety  Memory Memory assist level: Recognizes or recalls 25 - 49% of the time/requires cueing 50 - 75% of the time  Medical Problem List and Plan: 1.  Paraparesis secondary to lumbar spinal stenosis and spondylolisthesis status post L2-3, L3-4 lumbar decompression and fusion 12/16/2068  -Cont CIR PT, OT  -team  conference today 2.  DVT Prophylaxis/Anticoagulation: Pharmaceutical: Lovenox 3. Pain Management: Previous mental status changes likely related to escalated narcotics, D/C'ed Oxycontin BID with celebrex bid and prn oxycodone for breakthrough pain.  On gabapentin for neuropathy.   -likes kpad---would prefer to use this as oppose to oxycodone 4. Mood: Team to provide ego support. LCSW to follow for evaluation and support.   -  sleep chart 5. Neuropsych: This patient is capable of making decisions on her own behalf.  -some improvement with decreased narcs? 6. Skin/Wound Care: Monitor back wound daily for healing 7. Fluids/Electrolytes/Nutrition:   -BUN elevated  -encourage fluids  -following labs 8. HTN: Monitor BP bid. Will hold lasix due to hypotension. Continue Cardizem with parameters. .  10 COPD/Asthma:  Encourage IS with fluter valve. Respiratory status stable on Dulera, Spiriva and Singulair.  11. CKD:   Avoid nephrotoxic medications  12. Constipation: responsive to meds  LOS (Days) 5 A FACE TO FACE EVALUATION WAS PERFORMED  SWARTZ,ZACHARY T 01/07/2016 8:58 AM

## 2016-01-07 NOTE — Progress Notes (Signed)
Speech Language Pathology Daily Session Note  Patient Details  Name: [name] MRN: [id number] Date of Birth: 1945/09/19  Today's Date: 01/07/2016 SLP Individual Time: 1400-1500 SLP Individual Time Calculation (min): 60 min   Short Term Goals: Week 1: SLP Short Term Goal 1 (Week 1): Pt attend to task for 5 minutes with min A.  SLP Short Term Goal 2 (Week 1): Pt to recall novel information with mod A and use of compensatory strategies.  SLP Short Term Goal 3 (Week 1): Pt to demonstrate basic reasoning in functional tasks with min A.  SLP Short Term Goal 4 (Week 1): Pt demonstrate basic money counting with min A.  Skilled Therapeutic Interventions:   Skilled treatment session focused on addressing cognition goals. SLP facilitated session by providing Max assist multimodal cues to initially attend to and initiate a basic problem solving task due to patient being verbose and tangential and wanting to tell her life story to SLP.  Patient then completed basic money management of counting money and making change with Mod assist cues for self-monitoring and correcting of errors.  Patient attend to task for 1 turn, about 45-60 seconds, but then required redirection after each item was completed.  Aware that there has been mention of patient being close to baseline; recommend brief SLP follow up to ensure baseline status and complete family education.  Neuropsych consult is also recommended to assist with a safe discharge plan.     Function:  Cognition Comprehension Comprehension assist level: Understands basic 50 - 74% of the time/ requires cueing 25 - 49% of the time  Expression   Expression assist level: Expresses basic 75 - 89% of the time/requires cueing 10 - 24% of the time. Needs helper to occlude trach/needs to repeat words.  Social Interaction Social Interaction assist level: Interacts appropriately 50 - 74% of the time - May be physically or verbally inappropriate.  Problem Solving  Problem solving assist level: Solves basic 25 - 49% of the time - needs direction more than half the time to initiate, plan or complete simple activities  Memory Memory assist level: Recognizes or recalls 25 - 49% of the time/requires cueing 50 - 75% of the time    Pain Pain Assessment Pain Assessment: 0-10 Pain Score: 3  Pain Type: Surgical pain Pain Location: Back Pain Orientation: Lower Pain Descriptors / Indicators: Aching Pain Onset: On-going Patients Stated Pain Goal: 1 Pain Intervention(s): Other (Comment) (pt reported that she was not ready for meds yet) Multiple Pain Sites: No  Therapy/Group: Individual Therapy  [name], M.A., CCC-SLP [phone]  [name] 01/07/2016, 4:14 PM

## 2016-01-07 NOTE — Plan of Care (Signed)
Problem: RH Bathing Goal: LTG Patient will bathe with assist, cues/equipment (OT) LTG: Patient will bathe specified number of body parts with assist with/without cues using equipment (position)  (OT)  Goal downgraded due to cognition and need for VCs to maintain spinal precautions. -AL  Problem: RH Dressing Goal: LTG Patient will perform lower body dressing w/assist (OT) LTG: Patient will perform lower body dressing with assist, with/without cues in positioning using equipment (OT)  Goal modified 8/22- AL  Problem: RH Toileting Goal: LTG Patient will perform toileting w/assist, cues/equip (OT) LTG: Patient will perform toiletiing (clothes management/hygiene) with assist, with/without cues using equipment (OT)  Goal modified due to cognition and need for VCs to maintain spinal precautions.- AL 8/22  Problem: RH Simple Meal Prep Goal: LTG Patient will perform simple meal prep w/assist (OT) LTG: Patient will perform simple meal prep with assistance, with/without cues (OT).  Outcome: Not Applicable Date Met: 36/06/77 Goal d/c due to change in d/c disposition.- AL  Problem: RH Light Housekeeping Goal: LTG Patient will perform light housekeeping w/assist (OT) LTG: Patient will perform light housekeeping with assistance, with/without cues (OT).  Outcome: Not Applicable Date Met: 03/40/35 Goal d/c due to change in d/c disposition. -AL  Problem: RH Tub/Shower Transfers Goal: LTG Patient will perform tub/shower transfers w/assist (OT) LTG: Patient will perform tub/shower transfers with assist, with/without cues using equipment (OT)  Outcome: Not Applicable Date Met: 24/81/85 Goal d/c due to change in d/c disposition  Problem: RH Memory Goal: LTG Patient will demonstrate ability for day to day (OT) LTG:  Patient will demonstrate ability for day to day recall/carryover during activities of daily living with assist  (OT)  Downgraded due to pt progress. - AL 8/22

## 2016-01-07 NOTE — Progress Notes (Signed)
Occupational Therapy Session Note  Patient Details  Name: [name] MRN: [id number] Date of Birth: 04-24-1946  Today's Date: 01/07/2016 OT Individual Time: 1000-1100 OT Individual Time Calculation (min): 60 min     Short Term Goals:Week 1:  OT Short Term Goal 1 (Week 1): Pt will recall 3/3 back precautions with min VC OT Short Term Goal 2 (Week 1): Pt will complete LB dressing using AE with VC for technique OT Short Term Goal 3 (Week 1): Pt will perform tub transfer with min A for balance  OT Short Term Goal 4 (Week 1): Pt will maintain back precautions during functional task with min VC  Skilled Therapeutic Interventions/Progress Updates:    Pt seen for OT ADL bathing/dressing session. Pt sitting up in w/c upon arrival, and with encouragement willing to participate in tx session. She voiced pain 10/10, however, able to be easily re-directed. See below for pain interventions.  Worked with pt in reading therapy schedule for the day, requiring mod A and increased time to problem solve schedule for today.  She ambulated throughout session with CGA without AD. She bathed seated on shower chair with supervision, requiring max VCs to maintain spinal pre-cautions and recall not to stand without TLSO donned. She required increased assist for clothing management when attempting to don dress and TLSO. Pt returned to w/c and requested to make phone call to friend. Required assist with  Working telephone despite written directions already provided to pt. Pt left sitting in w/c at end of session, al needs in reach and QRB donned.  Pt's cognition improved today compared to yesterday, however, cont to require mod-max cuing for basic recall and problem solving.   Therapy Documentation Precautions:  Precautions Precautions: Back, Fall Precaution Booklet Issued: Yes (comment) Precaution Comments: Pt with decreased awareness of precautions Required Braces or Orthoses: Spinal Brace Spinal Brace:  Thoracolumbosacral orthotic, Applied in sitting position Restrictions Weight Bearing Restrictions: No Pain: Pain Assessment Pain Score: 10-Worst pain ever Pain Type: Surgical pain Pain Location: Back Pain Descriptors / Indicators: Aching;Discomfort Pain Intervention(s): Medication (See eMAR), RN aware, shower, repositioned, distraction  See Function Navigator for Current Functional Status.   Therapy/Group: Individual Therapy  Lewis, Shellia Hartl C 01/07/2016, 7:14 AM

## 2016-01-07 NOTE — Plan of Care (Signed)
Goals downgraded due to cognition and need for cuing to maintain spinal precautions. Have discharged IADL goals due to change in d/c disposition. See POC for goal details. Johnsie CancelAmy Lewis, OTR/L

## 2016-01-07 NOTE — Progress Notes (Signed)
Occupational Therapy Session Note  Patient Details  Name: [name] MRN: [id number] Date of Birth: Mar 24, 1946  Today's Date: 01/07/2016 OT Individual Time: 1132-1202 OT Individual Time Calculation (min): 30 min     Short Term Goals: Week 1:  OT Short Term Goal 1 (Week 1): Pt will recall 3/3 back precautions with min VC OT Short Term Goal 2 (Week 1): Pt will complete LB dressing using AE with VC for technique OT Short Term Goal 3 (Week 1): Pt will perform tub transfer with min A for balance  OT Short Term Goal 4 (Week 1): Pt will maintain back precautions during functional task with min VC  Skilled Therapeutic Interventions/Progress Updates:    Treatment session with focus on education on AE to increase independence and adherence to back precautions during LB dressing.  Pt sitting upright in w/c with c/o pain and complaining that she's been sitting up all morning and needs to get back in the bed.  Increased cues and discussion to reorient to time and situation with only 30 min break between last two sessions.  Encouraged pt to participate in education on AE with promise to return to bed at end of session.  Pt distracted throughout session with perseveration on pain and not being allowed to get in bed.  No evidence of understanding with use of AE despite verbal and demonstration cues, however pt verbalizing that she already knows how to use AE.  Plan to continue to educate on back precautions and use of AE, would benefit from use during self-care session.  Therapy Documentation Precautions:  Precautions Precautions: Back, Fall Precaution Booklet Issued: Yes (comment) Precaution Comments: Pt with decreased awareness of precautions Required Braces or Orthoses: Spinal Brace Spinal Brace: Thoracolumbosacral orthotic, Applied in sitting position Restrictions Weight Bearing Restrictions: No General:   Vital Signs: Oxygen Therapy SpO2: 98 % O2 Device: Not Delivered Pain: Pain  Assessment Pain Score: 10-Worst pain ever Pain Type: Surgical pain Pain Location: Back Pain Descriptors / Indicators: Aching;Discomfort Pain Intervention(s): Medication (See eMAR)  See Function Navigator for Current Functional Status.   Therapy/Group: Individual Therapy  Rosalio LoudHOXIE, Chaynce Schafer 01/07/2016, 12:05 PM

## 2016-01-07 NOTE — Progress Notes (Signed)
Physical Therapy Session Note  Patient Details  Name: [name] A Carchi MRN: [id number] Date of Birth: Jul 23, 1945  Today's Date: 01/07/2016 PT Individual Time: 0830-0930 PT Individual Time Calculation (min): 60 min    Short Term Goals: Week 1:  PT Short Term Goal 1 (Week 1): =LTG due to estimated LOS  Skilled Therapeutic Interventions/Progress Updates:   Pt received seated on EOB with RN present; c/o 8/10 back pain and agreeable to treatment. SetupA for dressing; discussed importance of setup prior to going to bed at night in order to more efficiently get dressed in the morning without having to don/doff TLSO repetitively. Requires several attempts and ultimately minA to thread LLE into underwear, and requires A to zip back of dress. Pt dons TLSO in sitting with mod verbal cues. Sit <>stand S with RW and pt pulled underwear over hips without assist. Standing at sink pt performs grooming with S and RW for balance. Gait to gym with RW and S x150'. Stairs 2x12 with BUE support and S; min cues for which LE to lead with to ensure BLEs being used. Gait 2x90' with no AD and min guard; improving speed and no c/o pain with ambulation. Gait weaving around cones 4x15' for coordination, attention, visual scanning, dynamic balance. Gait to room with no AD and min guard; remained seated in w/c at end of session with quick release belt intact and all needs in reach.   Therapy Documentation Precautions:  Precautions Precautions: Back, Fall Precaution Booklet Issued: Yes (comment) Precaution Comments: Pt with decreased awareness of precautions Required Braces or Orthoses: Spinal Brace Spinal Brace: Thoracolumbosacral orthotic, Applied in sitting position Restrictions Weight Bearing Restrictions: No Pain: Pain Assessment Pain Assessment: 0-10 Pain Score: Asleep Pain Type: Surgical pain Pain Location: Back Pain Orientation: Lower Pain Descriptors / Indicators: Aching Pain Frequency: Constant Pain Onset:  On-going Patients Stated Pain Goal: 4 Pain Intervention(s): Medication (See eMAR)   See Function Navigator for Current Functional Status.   Therapy/Group: Individual Therapy  Vista Lawmanlizabeth J Tygielski 01/07/2016, 9:36 AM

## 2016-01-08 ENCOUNTER — Inpatient Hospital Stay (HOSPITAL_COMMUNITY): Payer: Medicare Other | Admitting: Physical Therapy

## 2016-01-08 ENCOUNTER — Inpatient Hospital Stay (HOSPITAL_COMMUNITY): Payer: Medicare Other | Admitting: Speech Pathology

## 2016-01-08 ENCOUNTER — Inpatient Hospital Stay (HOSPITAL_COMMUNITY): Payer: Medicare Other | Admitting: Occupational Therapy

## 2016-01-08 NOTE — Progress Notes (Addendum)
Speech Language Pathology Daily Session Note  Patient Details  Name: [name] MRN: [id number] Date of Birth: 02-Oct-1945  Today's Date: 01/08/2016 SLP Individual Time: [id number] SLP Individual Time Calculation (min): 29 min   Short Term Goals: Week 1: SLP Short Term Goal 1 (Week 1): Pt attend to task for 5 minutes with min A.  SLP Short Term Goal 2 (Week 1): Pt to recall novel information with mod A and use of compensatory strategies.  SLP Short Term Goal 3 (Week 1): Pt to demonstrate basic reasoning in functional tasks with min A.  SLP Short Term Goal 4 (Week 1): Pt demonstrate basic money counting with min A.  Skilled Therapeutic Interventions:  Pt was seen for skilled ST targeting cognitive goals.  Pt received on the toilet, very upset that she had been incontinent of bowel, max assist verbal cues needed for redirection in order to allow therapist to donn back brace and zip up dress following hygiene.  Pt able to verbalize spinal precautions with min question cues but did not consistently carry them over into practice during functional tasks due to impulsivity and poor safety awareness.  Pt did ask for assistance appropriately when gathering clothing items from the floor of the bathroom to prevent from bending with supervision question cues.  Pt agreeable to ambulating with therapist to get a cup of coffee.  Pt was able to make a cup of coffee and clean up area around coffee maker with mod I for sequencing and basic problem solving despite pt's distraction internally.  Pt also was able to recall route to room with supervision verbal cues for verification of room number.  Pt was returned to room and left in bed with call bell within reach and bed alarm set.  Continue per current plan of care.    Function:  Eating Eating                 Cognition Comprehension Comprehension assist level: Understands basic 50 - 74% of the time/ requires cueing 25 - 49% of the time  Expression    Expression assist level: Expresses basic 75 - 89% of the time/requires cueing 10 - 24% of the time. Needs helper to occlude trach/needs to repeat words.  Social Interaction Social Interaction assist level: Interacts appropriately 50 - 74% of the time - May be physically or verbally inappropriate.  Problem Solving Problem solving assist level: Solves basic 25 - 49% of the time - needs direction more than half the time to initiate, plan or complete simple activities  Memory Memory assist level: Recognizes or recalls 25 - 49% of the time/requires cueing 50 - 75% of the time    Pain Pain Assessment Pain Assessment: No/denies pain   Therapy/Group: Individual Therapy  [name], Melanee SpryNicole L 01/08/2016, 4:04 PM

## 2016-01-08 NOTE — Progress Notes (Signed)
Physical Therapy Session Note  Patient Details  Name: [name] MRN: [id number] Date of Birth: 05-02-1946  Today's Date: 01/08/2016 PT Individual Time: 0730-0830 PT Individual Time Calculation (min): 60 min    Short Term Goals: Week 1:  PT Short Term Goal 1 (Week 1): =LTG due to estimated LOS  Skilled Therapeutic Interventions/Progress Updates:   Pt received supine in bed wearing TLSO, agreeable to treatment. Supine>sit with logroll and S. Seated on EOB, pt instructed therapist on which clothes she wanted to wear and therapist retrieved for time management. Once preparing to get dressed, pt report she needs to use restroom. Gait in/out of bathroom min guard no AD. Performed hygiene without A. While seated on toilet, pt donned pajama shorts reporting she wanted to use them as underwear, and then donned jeans on top. Required assist for donning shorts as pt put both feet through one leg hole and was unable to problem solve how to fix. Pt also poured soap on hands and then rubbed in on thighs reporting "well I don't think I'm going to get a bath today so that will do"; attempted to encourage pt to wash soap off with washcloth however pt repetitively declined. Seated on EOB, pt required mod verbal cues to doff brace in order to change out of dress and into shirt; unable to reach top velcro strap d/t limited shoulder ROM. After completing dressing, pt stood from bed to prepare to walk to sink to groom, and attempted to take off TLSO, reporting "I can take this off now because I'm done getting ready"; reminded pt TLSO to be worn any time she is sitting up or out of bed. Standing at sink with S pt performed grooming including washing face, applying lotion. Gait to nurses station and gym x200' with min guard due to shuffling and anterior bias in standing. Nustep x8 min with BUE/BLE on level 6 with average 41 steps/min, performed for strengthening and aerobic endurance. Gait to return to room with min  guard, min cues for step length and to reduce shuffling. Remained supine in bed at end of session, all needs in reach and alarm intact.  Therapy Documentation Precautions:  Precautions Precautions: Back, Fall Precaution Booklet Issued: Yes (comment) Precaution Comments: Pt with decreased awareness of precautions Required Braces or Orthoses: Spinal Brace Spinal Brace: Thoracolumbosacral orthotic, Applied in sitting position Restrictions Weight Bearing Restrictions: No Pain: Pain Assessment Pain Assessment: 0-10 Pain Score: 10-Worst pain ever Pain Type: Surgical pain Pain Location: Back Pain Orientation: Right;Lower Pain Descriptors / Indicators: Aching Pain Frequency: Constant Pain Onset: On-going Patients Stated Pain Goal: 2 Pain Intervention(s):  (pre-medicated)   See Function Navigator for Current Functional Status.   Therapy/Group: Individual Therapy  Vista Lawmanlizabeth J Tygielski 01/08/2016, 8:23 AM

## 2016-01-08 NOTE — Patient Care Conference (Signed)
Inpatient RehabilitationTeam Conference and Plan of Care Update Date: 01/07/2016   Time: 2:10 PM    Patient Name: [name]      Medical Record Number: [id number]  Date of Birth: 12-20-1945 Sex: Female         Room/Bed: 4W20C/4W20C-01 Payor Info: Payor: MEDICARE / Plan: MEDICARE PART A AND B / Product Type: *No Product type* /    Admitting Diagnosis: Lumbar Fusion  Admit Date/Time:  01/02/2016  4:31 PM Admission Comments: No comment available   Primary Diagnosis:  <principal problem not specified> Principal Problem: <principal problem not specified>  Patient Active Problem List   Diagnosis Date Noted  . Spondylolisthesis of lumbar region 01/02/2016  . Surgery, elective   . Chronic obstructive pulmonary disease (HCC)   . Respiratory failure with hypercapnia (HCC)   . Chronic back pain   . Acute blood loss anemia   . Tachycardia   . Leukocytosis   . Thrombocytopenia (HCC)   . Lumbosacral spondylosis with radiculopathy 12/30/2015  . Acute encephalopathy   . AKI (acute kidney injury) (HCC)   . CKD (chronic kidney disease)   . HEADACHE 08/16/2009  . DYSLIPIDEMIA 02/20/2009  . TOBACCO ABUSE 02/20/2009  . Benign essential HTN 02/20/2009  . ALLERGIC RHINITIS 02/20/2009  . Asthma 02/20/2009  . SHOULDER PAIN, RIGHT, CHRONIC 02/20/2009  . HIP PAIN, RIGHT, CHRONIC 02/20/2009  . SPINAL STENOSIS, CERVICAL 02/20/2009  . SPINAL STENOSIS, LUMBAR 02/20/2009  . BACK PAIN, LUMBAR 02/20/2009    Expected Discharge Date: Expected Discharge Date:  (SNF)  Team Members Present: Physician leading conference: Dr. Faith RogueZachary Swartz Social Worker Present: Amada JupiterLucy Yardley Beltran, LCSW Nurse Present: Kennyth ArnoldStacey Jennings, RN PT Present: Alyson ReedyElizabeth Tygielski, PT OT Present: Johnsie CancelAmy Lewis, OT SLP Present: Feliberto Gottronourtney Payne, SLP PPS Coordinator present : Tora DuckMarie Noel, RN, CRRN     Current Status/Progress Goal Weekly Team Focus  Medical   lumbar decompression and fusion. mental status better after decrease in meds.  pain an issue still however  pain control, improved safety  pain mgt with holistic approach. bowel mgt   Bowel/Bladder   cont x2. LBM: 8/21 but required a suppository.   remain cont x2 with regular BM   add stool softner? Only has miralax.    Swallow/Nutrition/ Hydration             ADL's   Min A UB dressing; mod-max LB dressing; Supervision toileting  Supervision overall  Maintaining spinal precautions during functional tasks; AE training if able (difficult due to cognition); activity tolerance   Mobility   S bed mobility, min guard transfers, gait with RW  modI bed mobility, S gait in home/community and stairs  pt education, safety awareness, activity tolerance, gait training, dynamic standing balance   Communication             Safety/Cognition/ Behavioral Observations  Max-Total A  Min A  orientation, attention, problem solving    Pain   rates pain a 10/10 while awake   <4  assess and treat pain q shift, and educate patient on medications given    Skin   incisions x2. right abdomen/side with skin glue and honeycomb to lumbar back- CDI   remain free from infection/breakdown while on rehab  continue to monitor incisions     Rehab Goals Patient on target to meet rehab goals: No *See Care Plan and progress notes for long and short-term goals.  Barriers to Discharge: safety awareness, general cognitive awareness/memory?    Possible Resolutions to Barriers:  supervision at  home?     Discharge Planning/Teaching Needs:  Pt only has intermittent assist available and continues with significant safety concerns.  Will likely need to change plan to SNF.  TBD   Team Discussion:  Pt continues to have poor safety awareness, organizational skills, and memory of precautions.  Requires max cues for all activities and team feels strongly that she will require 24/7 supervision.  SW to follow up with family further, however, notes likely need to change plan to SNF.  Revisions to Treatment Plan:   Expect change of plan to SNF   Continued Need for Acute Rehabilitation Level of Care: The patient requires daily medical management by a physician with specialized training in physical medicine and rehabilitation for the following conditions: Daily direction of a multidisciplinary physical rehabilitation program to ensure safe treatment while eliciting the highest outcome that is of practical value to the patient.: Yes Daily medical management of patient stability for increased activity during participation in an intensive rehabilitation regime.: Yes Daily analysis of laboratory values and/or radiology reports with any subsequent need for medication adjustment of medical intervention for : Post surgical problems;Neurological problems;Mood/behavior problems  [name] 01/08/2016, 11:50 AM

## 2016-01-08 NOTE — Plan of Care (Signed)
Problem: RH Dressing Goal: LTG Patient will perform lower body dressing w/assist (OT) LTG: Patient will perform lower body dressing with assist, with/without cues in positioning using equipment (OT)  Goal downgraded as pt unable to use AE to assist with LB dressing due to cognitive impairments. She is unable to thread pants while maintaining spinal precautions without use of AE and therefore will require increased assist. See POC for goal details. Johnsie Cancel- Mame Twombly Lewis, OTR/L

## 2016-01-08 NOTE — Progress Notes (Signed)
[facility]IC[facility]ABILITATION     PROGRESS NOTE    Subjective/Complaints: kpad helpful after being re-positioned. Back still stiff. meds help. Moved bowels last night  ROS: +anxiety.  Pt denies fever, rash/itching, headache,nausea, vomiting, abdominal pain, diarrhea, chest pain, shortness of breath, palpitations, dysuria, dizziness,    Objective: Vital Signs: Blood pressure (!) 101/56, pulse 94, temperature 98.7 F (37.1 C), temperature source Oral, resp. rate 18, height 5\' 5"  (1.651 m), weight 69.3 kg (152 lb 12.5 oz), SpO2 94 %. No results found. No results for input(s): WBC, HGB, HCT, PLT in the last 72 hours. No results for input(s): NA, K, CL, GLUCOSE, BUN, CREATININE, CALCIUM in the last 72 hours.  Invalid input(s): CO CBG (last 3)  No results for input(s): GLUCAP in the last 72 hours.  Wt Readings from Last 3 Encounters:  01/02/16 69.3 kg (152 lb 12.5 oz)  12/30/15 66.1 kg (145 lb 11.6 oz)  12/27/15 61.8 kg (136 lb 4.8 oz)    Physical Exam:  Constitutional: no distress. Sitting eob HENT:  Head: Normocephalic and atraumatic.  Mouth/Throat: Oropharynx is clear and moist.  Eyes: Conjunctivae and EOM are normal. Pupils are equal, round, and reactive to light.  Neck: Normal range of motion. Neck supple.  Cardiovascular: Normal rate and regular rhythm.   No murmur heard. Respiratory: Effort normal and breath sounds normal. No stridor. No respiratory distress. She has no wheezes.  GI: Soft. Bowel sounds are normal.  There is no tenderness.  Musculoskeletal: She exhibits no edema or tenderness.  Neurological: She is alert and oriented to person, place, and time. STM memory deficits. Tangential.  Motor strength is 3 minus bilateral hip flexor, knee extensor, ankle dorsiflexor. Some of this is pain related, difficult to say whether this is true weakness.  Skin: Skin is warm and dry. Incisions clean/dry  Psychiatric: very pleasant and cooperative. Extremely  tangential.     Assessment/Plan: 1. 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 Physiatrist is providing close team supervision and 24 hour management of active medical problems listed below. Physiatrist and rehab team continue to assess barriers to discharge/monitor patient progress toward functional and medical goals.  Function:  Bathing Bathing position   Position: Shower  Bathing parts Body parts bathed by patient: Right arm, Left arm, Chest, Abdomen, Front perineal area, Buttocks, Right upper leg, Left upper leg Body parts bathed by helper: Right lower leg, Left lower leg, Back  Bathing assist Assist Level: Touching or steadying assistance(Pt > 75%)      Upper Body Dressing/Undressing Upper body dressing   What is the patient wearing?: Pull over shirt/dress Bra - Perfomed by patient: Thread/unthread right bra strap, Thread/unthread left bra strap, Hook/unhook bra (pull down sports bra) Bra - Perfomed by helper: Hook/unhook bra (pull down sports bra) Pull over shirt/dress - Perfomed by patient: Thread/unthread right sleeve, Thread/unthread left sleeve, Put head through opening, Pull shirt over trunk Pull over shirt/dress - Perfomed by helper: Thread/unthread right sleeve, Thread/unthread left sleeve     Orthosis activity level: Performed by helper  Upper body assist Assist Level: Supervision or verbal cues      Lower Body Dressing/Undressing Lower body dressing   What is the patient wearing?: Underwear, Pants Underwear - Performed by patient: Thread/unthread left underwear leg, Pull underwear up/down Underwear - Performed by helper: Thread/unthread right underwear leg Pants- Performed by  patient: Thread/unthread right pants leg, Thread/unthread left pants leg, Pull pants up/down     Non-skid  slipper socks- Performed by helper: Don/doff right sock, Don/doff left sock   Socks - Performed by helper: Don/doff right sock, Don/doff left sock Shoes - Performed by patient: Don/doff right shoe, Don/doff left shoe Shoes - Performed by helper: Fasten right, Fasten left       TED Hose - Performed by helper: Don/doff right TED hose, Don/doff left TED hose  Lower body assist Assist for lower body dressing: Touching or steadying assistance (Pt > 75%)      Toileting Toileting   Toileting steps completed by patient: Adjust clothing prior to toileting, Performs perineal hygiene Toileting steps completed by helper: Adjust clothing after toileting Toileting Assistive Devices: Grab bar or rail  Toileting assist Assist level: Touching or steadying assistance (Pt.75%)   Transfers Chair/bed transfer   Chair/bed transfer method: Stand pivot Chair/bed transfer assist level: Supervision or verbal cues Chair/bed transfer assistive device: Armrests     Locomotion Ambulation     Max distance: 100 Assist level: Touching or steadying assistance (Pt > 75%)   Wheelchair          Cognition Comprehension Comprehension assist level: Understands basic 50 - 74% of the time/ requires cueing 25 - 49% of the time  Expression Expression assist level: Expresses basic 75 - 89% of the time/requires cueing 10 - 24% of the time. Needs helper to occlude trach/needs to repeat words.  Social Interaction Social Interaction assist level: Interacts appropriately 50 - 74% of the time - May be physically or verbally inappropriate.  Problem Solving Problem solving assist level: Solves basic 25 - 49% of the time - needs direction more than half the time to initiate, plan or complete simple activities  Memory Memory assist level: Recognizes or recalls 25 - 49% of the time/requires cueing 50 - 75% of the time  Medical Problem List and Plan: 1.  Paraparesis secondary to lumbar spinal stenosis and spondylolisthesis status  post L2-3, L3-4 lumbar decompression and fusion 12/16/2068  -Cont CIR PT, OT  -will need supervision at discharge due to safety concerns  -apparently behavior/cognition near baseline per family 2.  DVT Prophylaxis/Anticoagulation: Pharmaceutical: Lovenox 3. Pain Management: Previous mental status changes likely related to escalated narcotics, off Oxycontin BID with celebrex bid and prn oxycodone for breakthrough pain.  On gabapentin for neuropathy.   -continue kpad---would prefer to use this as oppose to oxycodone 4. Mood: Team to provide ego support. LCSW to follow for evaluation and support.   -sleep chart 5. Neuropsych: This patient is capable of making decisions on her own behalf.  -some improvement with decreased narcs? 6. Skin/Wound Care: Monitor back wound daily for healing 7. Fluids/Electrolytes/Nutrition:   -BUN elevated  -encourage fluids  -following labs 8. HTN: Monitor BP bid. holding lasix due to hypotension. Continue Cardizem with parameters. .  10 COPD/Asthma:  Encourage IS with fluter valve. Respiratory status stable on Dulera, Spiriva and Singulair.  11. CKD:   Avoid nephrotoxic medications  12. Constipation: responsive to meds  LOS (Days) 6 A FACE TO FACE EVALUATION WAS PERFORMED  SWARTZ,ZACHARY T 01/08/2016 9:11 AM

## 2016-01-08 NOTE — Progress Notes (Signed)
Occupational Therapy Session Note  Patient Details  Name: [name] MRN: [id number] Date of Birth: 1945-07-22  Today's Date: 01/08/2016 OT Individual Time: [id number] and 1430-1500 OT Individual Time Calculation (min): 75 min and 30 min    Short Term Goals:Week 1:  OT Short Term Goal 1 (Week 1): Pt will recall 3/3 back precautions with min VC OT Short Term Goal 2 (Week 1): Pt will complete LB dressing using AE with VC for technique OT Short Term Goal 3 (Week 1): Pt will perform tub transfer with min A for balance  OT Short Term Goal 4 (Week 1): Pt will maintain back precautions during functional task with min VC  Skilled Therapeutic Interventions/Progress Updates:    Session One: Pt seen for OT session focusing on cognitive re-training. Pt in supine upon arrival, and agreeable to tx session.  Pt not orientated to time and confused of whether she had had shower earlier this morning. Pt created shower schedule for days of the week with pattern of showering every other day. Pt able to correctly sequence days of week, however, required max cuing for problem solving pattern and for orientation to day.  She then worked on creating pain medicine schedule as pt asking continuously throughout day for meds. She initially required max cuing for abstract task, progressing to min A within the same task. Completed similar task of writing out therapy schedule following written directions, requiring max cuing to complete task.  Pt able to maintain attention to task throughout session with minumal cuing. She became slightly frustrated with cognitive deficits, aware that her thinking "isn't right".   Pt returned to supine at end of session, left with all needs in reach and bed alarm on.   Session Two: Pt seen for OT session addressing ADL re-training and cognition. Pt sitting EOB upon arrival, upset as she had stool marks on pants/ bedding and needed to complete hygiene. Pt able to recall need for TLSO  to be donned prior to standing. She ambulated into bathroom to complete hygiene. She required max cuing to adhere to spinal pre-cautions during functional tasks despite continually saying " I can't bend my back".  Pt hyperverbal throughout session regarding d/c planning, current rehab stay, medication, and current level of function. Pt unable to be redirected to task or understand OTs attempt to answer concerns regarding the above topics. Pt decided to change clothes while seated on toilet. Provided with dress which pt donned inside out. Attempted VCs for problem solving, however, pt stated "I'm old and I know what I'm doing, I know how to put on a dress, I have my reasons for doing it this way".  Pt unable to be re-directed in order to complete toileting task and therefore left with hand off to SLP while seated on toilet.    Therapy Documentation Precautions:  Precautions Precautions: Back, Fall Precaution Booklet Issued: Yes (comment) Precaution Comments: Pt with decreased awareness of precautions Required Braces or Orthoses: Spinal Brace Spinal Brace: Thoracolumbosacral orthotic, Applied in sitting position Restrictions Weight Bearing Restrictions: No Pain: Pain Assessment Pain Assessment: 0-10 Pain Score: 10-Worst pain ever ("11") Pain Type: Surgical pain Pain Location: Back Pain Orientation: Lower Pain Descriptors / Indicators: Aching Pain Frequency: Constant Pain Onset: On-going Patients Stated Pain Goal: 2 Pain Intervention(s): Medication (See eMAR)  See Function Navigator for Current Functional Status.   Therapy/Group: Individual Therapy  Lewis, Wells Mabe C 01/08/2016, 7:06 AM

## 2016-01-08 NOTE — Plan of Care (Signed)
Problem: RH PAIN MANAGEMENT Goal: RH STG PAIN MANAGED AT OR BELOW PT'S PAIN GOAL Pain less than 5 on a scale form 0-10  Outcome: Not Progressing Patient rates pain 10/10 while awake

## 2016-01-09 ENCOUNTER — Inpatient Hospital Stay (HOSPITAL_COMMUNITY): Payer: Medicare Other | Admitting: Speech Pathology

## 2016-01-09 ENCOUNTER — Inpatient Hospital Stay (HOSPITAL_COMMUNITY): Payer: Medicare Other | Admitting: Physical Therapy

## 2016-01-09 ENCOUNTER — Inpatient Hospital Stay (HOSPITAL_COMMUNITY): Payer: Medicare Other | Admitting: Occupational Therapy

## 2016-01-09 NOTE — Progress Notes (Signed)
Speech Language Pathology Daily Session Note  Patient Details  Name: [name] MRN: [id number] Date of Birth: 10/10/1945  Today's Date: 01/09/2016 SLP Individual Time: 0815-0855 SLP Individual Time Calculation (min): 40 min   Short Term Goals: Week 1: SLP Short Term Goal 1 (Week 1): Pt attend to task for 5 minutes with min A.  SLP Short Term Goal 2 (Week 1): Pt to recall novel information with mod A and use of compensatory strategies.  SLP Short Term Goal 3 (Week 1): Pt to demonstrate basic reasoning in functional tasks with min A.  SLP Short Term Goal 4 (Week 1): Pt demonstrate basic money counting with min A.  Skilled Therapeutic Interventions: Skilled treatment session focused on cognitive goals. Upon arrival, patient was awake while in bed and perseverative on pain. Patient required Max A question cues for problem solving in regards to calling the nurse for medications. Patient followed 1 step commands in regards to repositioning in bed with extra time and demonstrated intermittent intellectual awareness in regards to need to decrease pain so she can participate in therapies in order to maximize recovery and eventually discharge. Clinician provided patient with a simplified daily schedule in which she was able to utilize to anticipate upcoming sessions with Min A verbal cues. Patient required overall Mod A verbal cues to sustain attention to functional tasks. Patient left supine in bed with alarm on and all needs within reach. Continue with current plan of care.   Function:  Cognition Comprehension Comprehension assist level: Understands basic 50 - 74% of the time/ requires cueing 25 - 49% of the time  Expression   Expression assist level: Expresses basic 50 - 74% of the time/requires cueing 25 - 49% of the time. Needs to repeat parts of sentences.  Social Interaction Social Interaction assist level: Interacts appropriately 75 - 89% of the time - Needs redirection for appropriate  language or to initiate interaction.  Problem Solving Problem solving assist level: Solves basic 25 - 49% of the time - needs direction more than half the time to initiate, plan or complete simple activities  Memory Memory assist level: Recognizes or recalls 50 - 74% of the time/requires cueing 25 - 49% of the time    Pain Pain Assessment Pain Score: 0-No pain Faces Pain Scale: No hurt  Therapy/Group: Individual Therapy  Mandeep Ferch 01/09/2016, 4:25 PM

## 2016-01-09 NOTE — Progress Notes (Signed)
Physical Therapy Session Note  Patient Details  Name: [name] A Zenor MRN: [id number] Date of Birth: 06/07/1945  Today's Date: 01/09/2016 PT Individual Time: 1415-1530 PT Individual Time Calculation (min): 75 min    Short Term Goals: Week 1:  PT Short Term Goal 1 (Week 1): =LTG due to estimated LOS  Skilled Therapeutic Interventions/Progress Updates:   Pt received supine in bed, c/o 10/10 pain in back and agreeable to treatment. No evidence of pain, grimacing or vocalizations of pain during session. Showed pt Faces scale, and pt reports again 10/10 and states "You'd never know it by looking at me" and reports she has a high pain tolerance and doesn't show when she is in pain. Gait to gym x150' with min guard, cues for step length to reduce shuffling and cues for navigation, attention. Nustep x12 min with BUE/BLE level 4 with average 40 steps/min for strengthening and aerobic endurance. Stairs 1x12 with 1 handrail and min guard, cues for sequencing to allow both LEs to lead alternately. Dynamic gait in hall forward/backward/sideways while engaged in cognitive dual task naming foods beginning with specific letters of the alphabet; initially pt quick to formulate response however with increased time had significantly increased difficulty with short term memory to recall which letter she was on, and difficulty with producing a response despite max cueing/leading questions. Couch and bed transfer performed with close S; unsteady when initially standing up however improved to min guard. Gait to return to room; max cues to recall room number and locate room. Remained supine at end of session, alarm intact and all needs in reach.   Therapy Documentation Precautions:  Precautions Precautions: Back, Fall Precaution Booklet Issued: Yes (comment) Precaution Comments: Pt with decreased awareness of precautions Required Braces or Orthoses: Spinal Brace Spinal Brace: Thoracolumbosacral orthotic, Applied in  sitting position Restrictions Weight Bearing Restrictions: No   See Function Navigator for Current Functional Status.   Therapy/Group: Individual Therapy  Vista Lawmanlizabeth J Tygielski 01/09/2016, 3:37 PM

## 2016-01-09 NOTE — Progress Notes (Signed)
Speech Language Pathology Daily Session Note  Patient Details  Name: [name] MRN: [id number] Date of Birth: Mar 27, 1946  Today's Date: 01/09/2016 SLP Individual Time: 1130-1200 SLP Individual Time Calculation (min): 30 min   Short Term Goals: Week 1: SLP Short Term Goal 1 (Week 1): Pt attend to task for 5 minutes with min A.  SLP Short Term Goal 2 (Week 1): Pt to recall novel information with mod A and use of compensatory strategies.  SLP Short Term Goal 3 (Week 1): Pt to demonstrate basic reasoning in functional tasks with min A.  SLP Short Term Goal 4 (Week 1): Pt demonstrate basic money counting with min A.  Skilled Therapeutic Interventions: Skilled treatment session focused on cognition goals. SLP facilitated the session by providing Max A verbal cues for basic reasoning in functional task. Pt required Mod A with basic money changing task as well as for attention to task for 5 minutes. Pt required Mod A verbal cues when looking at written schedule to discern which therapy was next. Pt returned to bed with bed alarm on and all needs left within reach. Continue current plan of care.   Function:   Cognition Comprehension Comprehension assist level: Understands basic 50 - 74% of the time/ requires cueing 25 - 49% of the time  Expression   Expression assist level: Expresses basic 50 - 74% of the time/requires cueing 25 - 49% of the time. Needs to repeat parts of sentences.  Social Interaction Social Interaction assist level: Interacts appropriately 75 - 89% of the time - Needs redirection for appropriate language or to initiate interaction.  Problem Solving Problem solving assist level: Solves basic 25 - 49% of the time - needs direction more than half the time to initiate, plan or complete simple activities  Memory Memory assist level: Recognizes or recalls 50 - 74% of the time/requires cueing 25 - 49% of the time    Pain Pain Assessment Pain Assessment: 0-10 Pain Score: 2   Pain Intervention(s): Repositioned  Therapy/Group: Individual Therapy  Karilyn Wind 01/09/2016, 1:15 PM

## 2016-01-09 NOTE — Progress Notes (Signed)
Occupational Therapy Session Note  Patient Details  Name: [name] MRN: [id number] Date of Birth: 1945-08-05  Today's Date: 01/09/2016 OT Individual Time: 4098-11910945-1055 OT Individual Time Calculation (min): 70 min     Short Term Goals:Week 1:  OT Short Term Goal 1 (Week 1): Pt will recall 3/3 back precautions with min VC OT Short Term Goal 2 (Week 1): Pt will complete LB dressing using AE with VC for technique OT Short Term Goal 3 (Week 1): Pt will perform tub transfer with min A for balance  OT Short Term Goal 4 (Week 1): Pt will maintain back precautions during functional task with min VC  Skilled Therapeutic Interventions/Progress Updates:    Pt seen for OT ADL bathing/dressing session. Pt in supine upon arrival, able to reference therapy schedule to determine current therapy session and what will occur during tx session with min VCs. She ambulated throughout room with CGA. She bathed seated on shower chair with supervision. Today, had pt wear TLSO in shower as pt with difficulty maintaining spinal pre-cautions during functional tasks and decreased recall of need to have TLSO donned during standing.   Spoke extensively with pt regarding d/c planning. Pt with significantly decreased awareness of deficits, stating she would have no difficulty d/c home alone. Spoke with pt regarding current deficits and difficulty she would have with medication management, maintaining spinal precautions, donning/ doffing brace, simple meal prep, etc. Pt currently requiring max VCs for basic memory with no re-call of previous session and no carry over of education. Encouraged pt to stay sitting up in w/c at end of session to build OOB tolerance. K-pad applied and all needs in reach. Pt verbalized proper directions for use of call bell if she has any needs.   Therapy Documentation Precautions:  Precautions Precautions: Back, Fall Precaution Booklet Issued: Yes (comment) Precaution Comments: Pt with  decreased awareness of precautions Required Braces or Orthoses: Spinal Brace Spinal Brace: Thoracolumbosacral orthotic, Applied in sitting position Restrictions Weight Bearing Restrictions: No   See Function Navigator for Current Functional Status.   Therapy/Group: Individual Therapy  Lewis, Noemi Ishmael C 01/09/2016, 7:12 AM

## 2016-01-09 NOTE — Progress Notes (Signed)
[facility]IT[facility]     PROGRESS NOTE    Subjective/Complaints: kpad helpful after being re-positioned. Back still stiff. meds help. Moved bowels last night  ROS: +anxiety.  Pt denies fever, rash/itching, headache,nausea, vomiting, abdominal pain, diarrhea, chest pain, shortness of breath, palpitations, dysuria, dizziness,    Objective: Vital Signs: Blood pressure 125/84, pulse 99, temperature 99.1 F (37.3 C), temperature source Oral, resp. rate 18, height 5\' 5"  (1.651 m), weight 69.3 kg (152 lb 12.5 oz), SpO2 100 %. No results found. No results for input(s): WBC, HGB, HCT, PLT in the last 72 hours. No results for input(s): NA, K, CL, GLUCOSE, BUN, CREATININE, CALCIUM in the last 72 hours.  Invalid input(s): CO CBG (last 3)  No results for input(s): GLUCAP in the last 72 hours.  Wt Readings from Last 3 Encounters:  01/02/16 69.3 kg (152 lb 12.5 oz)  12/30/15 66.1 kg (145 lb 11.6 oz)  12/27/15 61.8 kg (136 lb 4.8 oz)    Physical Exam:  Constitutional: no distress. Sitting eob HENT:  Head: Normocephalic and atraumatic.  Mouth/Throat: Oropharynx is clear and moist.  Eyes: Conjunctivae and EOM are normal. Pupils are equal, round, and reactive to light.  Neck: Normal range of motion. Neck supple.  Cardiovascular: Normal rate and regular rhythm.   No murmur heard. Respiratory: Effort normal and breath sounds normal. No stridor. No respiratory distress. She has no wheezes.  GI: Soft. Bowel sounds are normal.  There is no tenderness.  Musculoskeletal: She exhibits no edema or tenderness.  Neurological: She is alert and oriented to person, place, and time. STM memory deficits. Tangential.  Motor strength is 3 minus bilateral hip flexor, knee extensor, ankle dorsiflexor. Some of this is pain related, difficult to say whether this is true weakness.  Skin: Skin is warm and dry. Incisions clean/dry  Psychiatric: very pleasant and cooperative. Extremely  tangential.     Assessment/Plan: 1. 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 Physiatrist is providing close team supervision and 24 hour management of active medical problems listed below. Physiatrist and rehab team continue to assess barriers to discharge/monitor patient progress toward functional and medical goals.  Function:  Bathing Bathing position   Position: Shower  Bathing parts Body parts bathed by patient: Right arm, Left arm, Chest, Abdomen, Front perineal area, Buttocks, Right upper leg, Left upper leg Body parts bathed by helper: Right lower leg, Left lower leg, Back  Bathing assist Assist Level: Touching or steadying assistance(Pt > 75%)      Upper Body Dressing/Undressing Upper body dressing   What is the patient wearing?: Pull over shirt/dress Bra - Perfomed by patient: Thread/unthread right bra strap, Thread/unthread left bra strap, Hook/unhook bra (pull down sports bra) Bra - Perfomed by helper: Hook/unhook bra (pull down sports bra) Pull over shirt/dress - Perfomed by patient: Thread/unthread right sleeve, Thread/unthread left sleeve, Put head through opening, Pull shirt over trunk Pull over shirt/dress - Perfomed by helper: Thread/unthread right sleeve, Thread/unthread left sleeve     Orthosis activity level: Performed by helper  Upper body assist Assist Level: Supervision or verbal cues      Lower Body Dressing/Undressing Lower body dressing   What is the patient wearing?: Underwear, Pants Underwear - Performed by patient: Thread/unthread left underwear leg, Pull underwear up/down Underwear - Performed by helper: Thread/unthread right underwear leg Pants- Performed by patient:  Thread/unthread right pants leg, Thread/unthread left pants leg, Pull pants up/down     Non-skid  slipper socks- Performed by helper: Don/doff right sock, Don/doff left sock   Socks - Performed by helper: Don/doff right sock, Don/doff left sock Shoes - Performed by patient: Don/doff right shoe, Don/doff left shoe Shoes - Performed by helper: Fasten right, Fasten left       TED Hose - Performed by helper: Don/doff right TED hose, Don/doff left TED hose  Lower body assist Assist for lower body dressing: Touching or steadying assistance (Pt > 75%)      Toileting Toileting   Toileting steps completed by patient: Adjust clothing prior to toileting, Performs perineal hygiene Toileting steps completed by helper: Adjust clothing after toileting Toileting Assistive Devices: Grab bar or rail  Toileting assist Assist level: Touching or steadying assistance (Pt.75%)   Transfers Chair/bed transfer   Chair/bed transfer method: Stand pivot Chair/bed transfer assist level: Supervision or verbal cues Chair/bed transfer assistive device: Armrests     Locomotion Ambulation     Max distance: 100 Assist level: Touching or steadying assistance (Pt > 75%)   Wheelchair          Cognition Comprehension Comprehension assist level: Understands basic 50 - 74% of the time/ requires cueing 25 - 49% of the time  Expression Expression assist level: Expresses basic 50 - 74% of the time/requires cueing 25 - 49% of the time. Needs to repeat parts of sentences.  Social Interaction Social Interaction assist level: Interacts appropriately 50 - 74% of the time - May be physically or verbally inappropriate.  Problem Solving Problem solving assist level: Solves basic 25 - 49% of the time - needs direction more than half the time to initiate, plan or complete simple activities  Memory Memory assist level: Recognizes or recalls 25 - 49% of the time/requires cueing 50 - 75% of the time  Medical Problem List and Plan: 1.  Paraparesis secondary to lumbar spinal stenosis and spondylolisthesis status post L2-3, L3-4  lumbar decompression and fusion 12/16/2068  -Cont CIR PT, OT  -will need supervision at discharge due to safety concerns (likely SNF)  -apparently behavior/cognition near baseline per family 2.  DVT Prophylaxis/Anticoagulation: Pharmaceutical: Lovenox 3. Pain Management: Previous mental status changes likely related to escalated narcotics, off Oxycontin BID with celebrex bid and prn oxycodone for breakthrough pain.  On gabapentin for neuropathy.   -continue kpad---would prefer to use this as oppose to oxycodone 4. Mood: Team to provide ego support. LCSW to follow for evaluation and support.   -sleep chart 5. Neuropsych: This patient is capable of making decisions on her own behalf.  -some improvement with decreased narcs 6. Skin/Wound Care: Monitor back wound daily for healing 7. Fluids/Electrolytes/Nutrition:   -BUN elevated  -encourage fluids  -re-check labs tomorrow 8. HTN: Monitor BP bid. holding lasix due to hypotension. Continue Cardizem with parameters. .  10 COPD/Asthma:  Encourage IS with fluter valve. Respiratory status stable on Dulera, Spiriva and Singulair.  11. CKD:   Avoid nephrotoxic medications  12. Constipation: responsive to meds 13. Post-op anemia: recheck cbc in am  LOS (Days) 7 A FACE TO FACE EVALUATION WAS PERFORMED  SWARTZ,ZACHARY T 01/09/2016 8:49 AM

## 2016-01-09 NOTE — [place] FL2 (Signed)
[place] MEDICAID FL2 LEVEL OF CARE SCREENING TOOL     IDENTIFICATION  Patient Name: [name] Birthdate: 1945/09/09 Sex: female Admission Date (Current Location): 01/02/2016  [place]:  [place] [id number] Facility and Address:  [facility], [address], [place], [place] [address]      Provider Number: [id number]  Attending Physician Name and Address:  Ranelle OysterZachary T Swartz, MD  Relative Name and Phone Number:       Current Level of Care: Other (Comment) (Acute Inpatient Rehabiliation Unit) Recommended Level of Care: Skilled Nursing Facility Prior Approval Number:    Date Approved/Denied:   PASRR Number: [id number]  Discharge Plan: SNF    Current Diagnoses: Patient Active Problem List   Diagnosis Date Noted  . Spondylolisthesis of lumbar region 01/02/2016  . Surgery, elective   . Chronic obstructive pulmonary disease (HCC)   . Respiratory failure with hypercapnia (HCC)   . Chronic back pain   . Postoperative anemia due to acute blood loss   . Tachycardia   . Leukocytosis   . Thrombocytopenia (HCC)   . Lumbosacral spondylosis with radiculopathy 12/30/2015  . Acute encephalopathy   . AKI (acute kidney injury) (HCC)   . CKD (chronic kidney disease)   . HEADACHE 08/16/2009  . DYSLIPIDEMIA 02/20/2009  . TOBACCO ABUSE 02/20/2009  . Benign essential HTN 02/20/2009  . ALLERGIC RHINITIS 02/20/2009  . Asthma 02/20/2009  . SHOULDER PAIN, RIGHT, CHRONIC 02/20/2009  . HIP PAIN, RIGHT, CHRONIC 02/20/2009  . SPINAL STENOSIS, CERVICAL 02/20/2009  . SPINAL STENOSIS, LUMBAR 02/20/2009  . BACK PAIN, LUMBAR 02/20/2009    Orientation RESPIRATION BLADDER Height & Weight     Self, Place  Normal Continent Weight: 69.3 kg (152 lb 12.5 oz) Height:  5\' 5"  (165.1 cm)  BEHAVIORAL SYMPTOMS/MOOD NEUROLOGICAL BOWEL NUTRITION STATUS      Continent Diet  AMBULATORY STATUS COMMUNICATION OF NEEDS Skin   Limited Assist Verbally Normal                       Personal Care Assistance Level of Assistance  Bathing, Feeding, Dressing, Total care Bathing Assistance: Limited assistance Feeding assistance: Limited assistance Dressing Assistance: Limited assistance Total Care Assistance: Limited assistance   Functional Limitations Info             SPECIAL CARE FACTORS FREQUENCY  PT (By licensed PT), OT (By licensed OT)     PT Frequency: 5x/wk OT Frequency: 5x/wk            Contractures Contractures Info: Not present    Additional Factors Info  Code Status, Allergies Code Status Info: full Allergies Info: shellfish           Current Medications (01/09/2016):  This is the current hospital active medication list Current Facility-Administered Medications  Medication Dose Route Frequency Provider Last Rate Last Dose  . acetaminophen (TYLENOL) tablet 325-650 mg  325-650 mg Oral Q4H PRN Evlyn KannerPamela S Love, PA-C      . albuterol (PROVENTIL) (2.5 MG/3ML) 0.083% nebulizer solution 2.5 mg  2.5 mg Inhalation Q6H PRN Jacquelynn CreePamela S Love, PA-C      . alum & mag hydroxide-simeth (MAALOX/MYLANTA) 200-200-20 MG/5ML suspension 30 mL  30 mL Oral Q4H PRN Evlyn KannerPamela S Love, PA-C   30 mL at 01/07/16 1751  . bisacodyl (DULCOLAX) suppository 10 mg  10 mg Rectal Daily PRN Jacquelynn Creeamela S Love, PA-C   10 mg at 01/06/16 1622  . celecoxib (CELEBREX) capsule 200 mg  200 mg Oral Daily Jacquelynn Creeamela S Love, PA-C   200 mg at 01/09/16 [id number]  . cholecalciferol (VITAMIN D) tablet 1,000 Units  1,000 Units Oral Daily Jacquelynn Creeamela S Love, PA-C   1,000 Units at 01/09/16 [id number]  . diltiazem (CARDIZEM CD) 24 hr capsule 240 mg  240 mg Oral Daily Jacquelynn Creeamela S Love, PA-C   240 mg at 01/09/16 [id number]  . diphenhydrAMINE (BENADRYL) 12.5 MG/5ML elixir 12.5-25 mg  12.5-25 mg Oral Q6H PRN Evlyn KannerPamela S Love, PA-C      . enoxaparin (LOVENOX) injection 40 mg  40 mg Subcutaneous Q24H Pamela S Love, PA-C   40 mg at 01/08/16 1733  . gabapentin (NEURONTIN) capsule 300 mg  300 mg Oral TID Jacquelynn CreePamela S Love, PA-C    300 mg at 01/09/16 [id number]  . guaiFENesin-dextromethorphan (ROBITUSSIN DM) 100-10 MG/5ML syrup 5-10 mL  5-10 mL Oral Q6H PRN Jacquelynn CreePamela S Love, PA-C      . losartan (COZAAR) tablet 100 mg  100 mg Oral Daily Jacquelynn Creeamela S Love, PA-C   100 mg at 01/09/16 [id number]  . menthol-cetylpyridinium (CEPACOL) lozenge 3 mg  1 lozenge Oral PRN Evlyn KannerPamela S Love, PA-C       Or  . phenol (CHLORASEPTIC) mouth spray 1 spray  1 spray Mouth/Throat PRN Jacquelynn CreePamela S Love, PA-C      . methocarbamol (ROBAXIN) tablet 750 mg  750 mg Oral QID Evlyn KannerPamela S Love, PA-C   750 mg at 01/09/16 1209  . mometasone-formoterol (DULERA) 200-5 MCG/ACT inhaler 2 puff  2 puff Inhalation BID Jacquelynn CreePamela S Love, PA-C   2 puff at 01/09/16 [phone]  . montelukast (SINGULAIR) tablet 10 mg  10 mg Oral QHS Evlyn KannerPamela S Love, PA-C   10 mg at 01/08/16 2012  . oxyCODONE (Oxy IR/ROXICODONE) immediate release tablet 15 mg  15 mg Oral BID WC Evlyn KannerPamela S Love, PA-C   15 mg at 01/09/16 1209  . oxyCODONE (Oxy IR/ROXICODONE) immediate release tablet 5-10 mg  5-10 mg Oral Q3H PRN Jacquelynn CreePamela S Love, PA-C   10 mg at 01/09/16 0835  . pantoprazole (PROTONIX) EC tablet 20 mg  20 mg Oral Daily Jacquelynn Creeamela S Love, PA-C   20 mg at 01/09/16 [id number]  . polyethylene glycol (MIRALAX / GLYCOLAX) packet 17 g  17 g Oral BID Jacquelynn Creeamela S Love, PA-C   17 g at 01/09/16 [id number]  . prochlorperazine (COMPAZINE) tablet 5-10 mg  5-10 mg Oral Q6H PRN Jacquelynn CreePamela S Love, PA-C       Or  . prochlorperazine (COMPAZINE) injection 5-10 mg  5-10 mg Intramuscular Q6H PRN Jacquelynn CreePamela S Love, PA-C       Or  . prochlorperazine (COMPAZINE) suppository 12.5 mg  12.5 mg Rectal Q6H PRN Jacquelynn CreePamela S Love, PA-C      . rosuvastatin (CRESTOR) tablet 10 mg  10 mg Oral q1800 Evlyn KannerPamela S Love, PA-C   10 mg at 01/08/16 1733  . sodium phosphate (FLEET) 7-19 GM/118ML enema 1 enema  1 enema Rectal Once PRN Jacquelynn CreePamela S Love, PA-C      . tiotropium [facility](SPIRIVA) inhalation capsule 18 mcg  18 mcg Inhalation Daily Jacquelynn Creeamela S Love, PA-C   18 mcg at 01/09/16 [id number]  . traZODone (DESYREL) tablet  25-50 mg  25-50 mg Oral QHS PRN Jacquelynn Creeamela S Love, PA-C   50 mg at 01/08/16 2012     Discharge Medications: Please see discharge summary for a list of discharge medications.  Relevant Imaging Results:  Relevant Lab Results:   Additional Information SS#  [id number]  Amada JupiterHOYLE, Demetre Monaco,  LCSW

## 2016-01-09 NOTE — Progress Notes (Signed)
Social Work Patient ID: [name], female   DOB: 1945-09-04, 70 y.o.   MRN: [id number]   Attempted to discuss with pt yesterday the information from team at conference and the concerns that we have for her safety.  Pt seemed to listen sporadically but offers rambling responses.  She is really not able to understand her level of cognitive impairment and why she is not felt to be safe, alone at home.  I did discuss option of SNF unless family can coordinate 24/7 assist.  Pt asks to be able to "think about it."  I have also left VM for pt's daughter including concerns and have asked that she call me ASAP to discuss.  Will begin paperwork for SNF.    Tajha Sammarco, LCSW

## 2016-01-10 ENCOUNTER — Inpatient Hospital Stay (HOSPITAL_COMMUNITY): Payer: Medicare Other | Admitting: Speech Pathology

## 2016-01-10 ENCOUNTER — Inpatient Hospital Stay (HOSPITAL_COMMUNITY): Payer: Medicare Other | Admitting: Physical Therapy

## 2016-01-10 ENCOUNTER — Inpatient Hospital Stay (HOSPITAL_COMMUNITY): Payer: Medicare Other | Admitting: Occupational Therapy

## 2016-01-10 LAB — CBC
HEMATOCRIT: 28.7 % — AB (ref 36.0–46.0)
HEMOGLOBIN: 8.7 g/dL — AB (ref 12.0–15.0)
MCH: 30.3 pg (ref 26.0–34.0)
MCHC: 30.3 g/dL (ref 30.0–36.0)
MCV: 100 fL (ref 78.0–100.0)
Platelets: 395 10*3/uL (ref 150–400)
RBC: 2.87 MIL/uL — AB (ref 3.87–5.11)
RDW: 14.9 % (ref 11.5–15.5)
WBC: 12.4 10*3/uL — AB (ref 4.0–10.5)

## 2016-01-10 LAB — BASIC METABOLIC PANEL
ANION GAP: 8 (ref 5–15)
BUN: 8 mg/dL (ref 6–20)
CALCIUM: 9.5 mg/dL (ref 8.9–10.3)
CHLORIDE: 109 mmol/L (ref 101–111)
CO2: 21 mmol/L — AB (ref 22–32)
Creatinine, Ser: 1.07 mg/dL — ABNORMAL HIGH (ref 0.44–1.00)
GFR calc non Af Amer: 52 mL/min — ABNORMAL LOW (ref >60)
GLUCOSE: 73 mg/dL (ref 65–99)
POTASSIUM: 4.4 mmol/L (ref 3.5–5.1)
Sodium: 138 mmol/L (ref 135–145)

## 2016-01-10 NOTE — Progress Notes (Signed)
Occupational Therapy Weekly Progress Note  Patient Details  Name: [name] MRN: [id number] Date of Birth: 02/11/1946  Beginning of progress report period: January 03, 2016 End of progress report period: January 10, 2016  Today's Date: 01/10/2016 OT Individual Time: 1000-1100 OT Individual Time Calculation (min): 60 min    Patient has partially met 2 of 4 short term goals.  She is inconsistently able to recall back precautions with min A, however, at times requires total A to recall. She also requires max cuing to maintain precautions during functional tasks. She is able to complete walk-in shower transfer at supervision level, however, is cognitivally unable to complete simulated tub/shower transfer using tub bench. Shower transfer goal has been d/c due to pt's anticipated d/c to SNF. Also due to cognition, pt is unable to use AE to assist with LB dressing tasks and therefore requires mod-max A for LB dressing needs.   Patient continues to demonstrate the following deficits: altered mental status, cognitive deficits, lumbago (low back pain), muscle weakness (generalized) and pain in thoracic spine and therefore will continue to benefit from skilled OT intervention to enhance overall performance with BADL and Reduce care partner burden.  Patient progressing toward long term goals..  Plan of care revisions: Goals have been downgraded throughout the week due to pt's cognition and change in d/c disposition to SNF. .  OT Short Term Goals Week 1:  OT Short Term Goal 1 (Week 1): Pt will recall 3/3 back precautions with min VC OT Short Term Goal 1 - Progress (Week 1): Partly met OT Short Term Goal 2 (Week 1): Pt will complete LB dressing using AE with VC for technique OT Short Term Goal 2 - Progress (Week 1): Not met OT Short Term Goal 3 (Week 1): Pt will perform tub transfer with min A for balance  OT Short Term Goal 3 - Progress (Week 1): Partly met OT Short Term Goal 4 (Week 1): Pt will  maintain back precautions during functional task with min VC OT Short Term Goal 4 - Progress (Week 1): Not met Week 2:  OT Short Term Goal 1 (Week 2): STG=LTG due to anticipated d/c to SNF   Skilled Therapeutic Interventions/Progress Updates:    Pt seen for OT ADL bathing/dressing session. Pt sitting EOB upon arrival, agreeable to tx session. Throughout session, required mod VCs for re-direction to task. She ambulated with min guarding assist throughout room and unit. She bathed seated on shower chair, TLSO donned, VC for initiation of LH sponge. She was able to cross ankle over knee to don B socks, an improvement from previous sessions. Cont to require VCs throughout session to maintain precautions during functional tasks, however, was able to recall 3/3 spinal precautions with min cues today. She was also able to accurately recall schedule for wearing of TLSO in various tasks with 100% accuracy.    She ambulated throughout unit to complete laundry task. She was unable to recall verbal directions to laundry factility ~15 seconds after instructions were provided on 2 accounts. Suspect this is due to pt's lack of attention vs. Impaired memory. She completed laundry task with VCs. Pt returned to room at end of session, requesting return to supine. Left in supine with all needs in reach.    Therapy Documentation Precautions:  Precautions Precautions: Back, Fall Precaution Booklet Issued: Yes (comment) Precaution Comments: Pt with decreased awareness of precautions Required Braces or Orthoses: Spinal Brace Spinal Brace: Thoracolumbosacral orthotic, Applied in sitting position Restrictions Weight Bearing  Restrictions: No Pain: Pain Assessment Pain Assessment: 0-10 Faces Pain Scale: Hurts a little bit Pain Type: Surgical pain Pain Location: Back Pain Intervention(s): Repositioned; shower  See Function Navigator for Current Functional Status.   Therapy/Group: Individual Therapy  Lewis, Dannon Nguyenthi  C 01/10/2016, 7:06 AM

## 2016-01-10 NOTE — Progress Notes (Signed)
Physical Therapy Session Note  Patient Details  Name: [name] MRN: [id number] Date of Birth: 1945/07/08  Today's Date: 01/10/2016 PT Individual Time: 0900-1000 PT Individual Time Calculation (min): 60 min    Short Term Goals: Week 1:  PT Short Term Goal 1 (Week 1): =LTG due to estimated LOS  Skilled Therapeutic Interventions/Progress Updates:   Pt received supine in bed, c/o 8/10 back pain and agreeable to treatment. Pt perseverative throughout session on thinking "I'm dying and you guys are hiding it from me", reported she had saved clothes from a incontinent bowel movement "so if I die they can test my bowel movement to see what was wrong with me". Therapist donned TLSO on EOB with maxA. Sit <>stand to straighten gown/brace with S. Gait in hall x200' with S overall, min guard when turning corners or turning around. Dynavision on mode A endurance x4 min for visual scanning, sustained attention; average reaction time 3.4 sec with slower reaction speed in upper right quadrant. Stairs x12 6-inch height with B handrails and S. Sar transfer performed with S, gait on unlevel surface with min guard. Berg assessed with results as below; continues to demonstrate high risk for falls however significant improvement over baseline score at eval of 30/56. Gait speed improved to 2.31 ft/sec. Returned to room and remained seated in chair with NT present cleaning bed and agreeable to assist pt back to bed after finished with the bed.   Therapy Documentation Precautions:  Precautions Precautions: Back, Fall Precaution Booklet Issued: Yes (comment) Precaution Comments: Pt with decreased awareness of precautions Required Braces or Orthoses: Spinal Brace Spinal Brace: Thoracolumbosacral orthotic, Applied in sitting position Restrictions Weight Bearing Restrictions: No Pain: Pain Assessment Pain Assessment: 0-10 Pain Score: Asleep Pain Type: Surgical pain Pain Location: Back Pain Intervention(s):  Repositioned  Balance: Standardized Balance Assessment Standardized Balance Assessment: Berg Balance Test Berg Balance Test Sit to Stand: Able to stand without using hands and stabilize independently Standing Unsupported: Able to stand safely 2 minutes Sitting with Back Unsupported but Feet Supported on Floor or Stool: Able to sit safely and securely 2 minutes Stand to Sit: Controls descent by using hands Transfers: Able to transfer safely, definite need of hands Standing Unsupported with Eyes Closed: Able to stand 10 seconds safely Standing Ubsupported with Feet Together: Able to place feet together independently and stand for 1 minute with supervision From Standing, Reach Forward with Outstretched Arm: Can reach forward >5 cm safely (2") From Standing Position, Pick up Object from Floor: Unable to try/needs assist to keep balance (NT d/t back precautions) From Standing Position, Turn to Look Behind Over each Shoulder: Turn sideways only but maintains balance Turn 360 Degrees: Able to turn 360 degrees safely in 4 seconds or less Standing Unsupported, Alternately Place Feet on Step/Stool: Able to stand independently and complete 8 steps >20 seconds Standing Unsupported, One Foot in Front: Able to plae foot ahead of the other independently and hold 30 seconds Standing on One Leg: Tries to lift leg/unable to hold 3 seconds but remains standing independently Total Score: 40   See Function Navigator for Current Functional Status.   Therapy/Group: Individual Therapy  Vista Lawmanlizabeth J Tygielski 01/10/2016, 9:52 AM

## 2016-01-10 NOTE — Progress Notes (Signed)
[facility]     PROGRESS NOTE    Subjective/Complaints: Up with therapy. States she didn't sleep well. Back pain comes and goes.   ROS: +anxiety.  Pt denies fever, rash/itching, headache,nausea, vomiting, abdominal pain, diarrhea, chest pain, shortness of breath, palpitations, dysuria, dizziness,    Objective: Vital Signs: Blood pressure 131/71, pulse 76, temperature 98.3 F (36.8 C), temperature source Oral, resp. rate 18, height 5\' 5"  (1.651 m), weight 69.3 kg (152 lb 12.5 oz), SpO2 99 %. No results found. No results for input(s): WBC, HGB, HCT, PLT in the last 72 hours. No results for input(s): NA, K, CL, GLUCOSE, BUN, CREATININE, CALCIUM in the last 72 hours.  Invalid input(s): CO CBG (last 3)  No results for input(s): GLUCAP in the last 72 hours.  Wt Readings from Last 3 Encounters:  01/02/16 69.3 kg (152 lb 12.5 oz)  12/30/15 66.1 kg (145 lb 11.6 oz)  12/27/15 61.8 kg (136 lb 4.8 oz)    Physical Exam:  Constitutional: no distress. Sitting eob HENT:  Head: Normocephalic and atraumatic.  Mouth/Throat: Oropharynx is clear and moist.  Eyes: Conjunctivae and EOM are normal. Pupils are equal, round, and reactive to light.  Neck: Normal range of motion. Neck supple.  Cardiovascular: Normal rate and regular rhythm.   No murmur heard. Respiratory: Effort normal and breath sounds normal. No stridor. No respiratory distress. She has no wheezes.  GI: Soft. Bowel sounds are normal.  There is no tenderness.  Musculoskeletal: She exhibits no edema or tenderness.  Neurological: She is alert and oriented to person, place, and time. STM memory deficits. Tangential.  Motor strength is 3 minus bilateral hip flexor, knee extensor, ankle dorsiflexor. Some of this is pain related, difficult to say whether this is true weakness.  Skin: Skin is warm and dry. Incisions clean/dry  Psychiatric: very pleasant and cooperative. Extremely tangential.      Assessment/Plan: 1. 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 Physiatrist is providing close team supervision and 24 hour management of active medical problems listed below. Physiatrist and rehab team continue to assess barriers to discharge/monitor patient progress toward functional and medical goals.  Function:  Bathing Bathing position   Position: Shower  Bathing parts Body parts bathed by patient: Right arm, Left arm, Chest, Abdomen, Front perineal area, Buttocks, Right upper leg, Left upper leg, Right lower leg, Left lower leg Body parts bathed by helper: Back  Bathing assist Assist Level: Supervision or verbal cues      Upper Body Dressing/Undressing Upper body dressing   What is the patient wearing?: Orthosis, Hospital gown Bra - Perfomed by patient: Thread/unthread right bra strap, Thread/unthread left bra strap, Hook/unhook bra (pull down sports bra) Bra - Perfomed by helper: Hook/unhook bra (pull down sports bra) Pull over shirt/dress - Perfomed by patient: Thread/unthread right sleeve, Thread/unthread left sleeve, Put head through opening, Pull shirt over trunk Pull over shirt/dress - Perfomed by helper: Thread/unthread right sleeve, Thread/unthread left sleeve     Orthosis activity level: Performed by helper  Upper body assist Assist Level: Supervision or verbal cues      Lower Body Dressing/Undressing Lower body dressing   What is the patient wearing?: Socks, Shoes Underwear - Performed by patient: Thread/unthread left underwear leg, Pull underwear up/down Underwear - Performed by helper: Thread/unthread right underwear leg Pants- Performed by patient: Thread/unthread right  pants leg, Thread/unthread left pants leg, Pull pants up/down     Non-skid slipper socks- Performed by  helper: Don/doff right sock, Don/doff left sock   Socks - Performed by helper: Don/doff right sock, Don/doff left sock Shoes - Performed by patient: Don/doff right shoe, Don/doff left shoe Shoes - Performed by helper: Fasten right, Fasten left, Don/doff right shoe, Don/doff left shoe       TED Hose - Performed by helper: Don/doff right TED hose, Don/doff left TED hose  Lower body assist Assist for lower body dressing: Touching or steadying assistance (Pt > 75%)      Toileting Toileting   Toileting steps completed by patient: Adjust clothing prior to toileting Toileting steps completed by helper: Adjust clothing prior to toileting Toileting Assistive Devices: Grab bar or rail  Toileting assist Assist level: Touching or steadying assistance (Pt.75%)   Transfers Chair/bed transfer   Chair/bed transfer method: Stand pivot Chair/bed transfer assist level: Supervision or verbal cues Chair/bed transfer assistive device: Armrests     Locomotion Ambulation     Max distance: 200 Assist level: Touching or steadying assistance (Pt > 75%)   Wheelchair          Cognition Comprehension Comprehension assist level: Understands basic 75 - 89% of the time/ requires cueing 10 - 24% of the time  Expression Expression assist level: Expresses basic 75 - 89% of the time/requires cueing 10 - 24% of the time. Needs helper to occlude trach/needs to repeat words.  Social Interaction Social Interaction assist level: Interacts appropriately 75 - 89% of the time - Needs redirection for appropriate language or to initiate interaction.  Problem Solving Problem solving assist level: Solves basic 50 - 74% of the time/requires cueing 25 - 49% of the time  Memory Memory assist level: Recognizes or recalls 50 - 74% of the time/requires cueing 25 - 49% of the time  Medical Problem List and Plan: 1.  Paraparesis secondary to lumbar spinal stenosis and spondylolisthesis status post L2-3, L3-4 lumbar decompression  and fusion 12/16/2068  -Cont CIR PT, OT  -will need supervision at discharge due to safety concerns ---now going SNF  -apparently behavior/cognition near baseline per family 2.  DVT Prophylaxis/Anticoagulation: Pharmaceutical: Lovenox 3. Pain Management: Previous mental status changes likely related to escalated narcotics, off Oxycontin BID. Dc scheduled oxy IR as well  -continue with celebrex bid and prn oxycodone for breakthrough pain.   -On gabapentin for neuropathy.   -continue kpad---would prefer to use this as oppose to oxycodone 4. Mood: Team to provide ego support. LCSW to follow for evaluation and support.   -sleep chart 5. Neuropsych: This patient is capable of making decisions on her own behalf.  -some improvement with decreased narcs 6. Skin/Wound Care: Monitor back wound daily for healing 7. Fluids/Electrolytes/Nutrition:   -BUN elevated  -encourage fluids  -all labs still pending today 8. HTN: Monitor BP bid. holding lasix due to hypotension. Continue Cardizem with parameters. .  10 COPD/Asthma:  Encourage IS with fluter valve. Respiratory status stable on Dulera, Spiriva and Singulair.  11. CKD:   Avoid nephrotoxic medications  12. Constipation: responsive to meds 13. Post-op anemia:   -hgb pending  LOS (Days) 8 A FACE TO FACE EVALUATION WAS PERFORMED  SWARTZ,ZACHARY T 01/10/2016 9:48 AM

## 2016-01-10 NOTE — Progress Notes (Signed)
Physical Therapy Session Note  Patient Details  Name: [name] MRN: [id number] Date of Birth: [birth year]  Today's Date: 01/10/2016 PT Individual Time: 1445-1515 PT Individual Time Calculation (min): 30 min    Short Term Goals: Week 1:  PT Short Term Goal 1 (Week 1): =LTG due to estimated LOS  Skilled Therapeutic Interventions/Progress Updates:   Patient received supine in bed and agreeable to PT . Supine>sit transfer with supervision A to don back brace with max A from PT. Gait training for 1250ft x 2 with supervision A and RW. Min cues provided by PT for increased erect posture and decreased force of stepping o the LLE. Patient noted to have equal step length and force with second bout of gait training following nustep endurance and attention training x 12 minutes. Level 4>6 on nustep with moderate intermittent cues for proper steps per minute, patient unable to recall instructed from PT for appropriate step speed for >2 minutes.   Patient performed sit<>stand transfers with supervision A  And RW. PT assisted patient to doff back brace with max a following gait , sitting EOB  Patient returned to bed at end of treatment session with sit>supine transfer with supervision A. Bed alarm left on and call bell in reach.   Therapy Documentation Precautions:  Precautions Precautions: Back, Fall Precaution Booklet Issued: Yes (comment) Precaution Comments: Pt with decreased awareness of precautions Required Braces or Orthoses: Spinal Brace Spinal Brace: Thoracolumbosacral orthotic, Applied in sitting position Restrictions Weight Bearing Restrictions: No General:   Vital Signs: Therapy Vitals Temp: 99 F (37.2 C) Temp Source: Oral Pulse Rate: 96 Resp: 18 BP: 91/69 Patient Position (if appropriate): Lying Oxygen Therapy SpO2: 96 % O2 Device: Not Delivered   See Function Navigator for Current Functional Status.   Therapy/Group: Individual Therapy  Golden Popustin E  Memori Sammon 01/10/2016, 6:29 PM

## 2016-01-10 NOTE — Progress Notes (Signed)
Speech Language Pathology Weekly Progress and Session Note  Patient Details  Name: [name] MRN: [id number] Date of Birth: 18-Feb-1946  Beginning of progress report period: January 02, 2016 End of progress report period: January 10, 2016  Today's Date: 01/10/2016 SLP Individual Time: 0730-0825 SLP Individual Time Calculation (min): 55 min   Short Term Goals: Week 1: SLP Short Term Goal 1 (Week 1): Pt attend to task for 5 minutes with min A.  SLP Short Term Goal 1 - Progress (Week 1): Met SLP Short Term Goal 2 (Week 1): Pt to recall novel information with mod A and use of compensatory strategies.  SLP Short Term Goal 2 - Progress (Week 1): Not met SLP Short Term Goal 3 (Week 1): Pt to demonstrate basic reasoning in functional tasks with min A.  SLP Short Term Goal 3 - Progress (Week 1): Not met SLP Short Term Goal 4 (Week 1): Pt demonstrate basic money counting with min A. SLP Short Term Goal 4 - Progress (Week 1): Not met    New Short Term Goals: Week 2: SLP Short Term Goal 1 (Week 2): Patient will demonstrate sustained attention to a functional task for 10 minutes with Min A verbal cues for redirection.  SLP Short Term Goal 2 (Week 2): Patient will recall back precautions with Min A question cues.  SLP Short Term Goal 3 (Week 2): Patient will demonstrate basic problem solving for functional and familiar tasks with Min A verbal cues.  SLP Short Term Goal 4 (Week 2): Patient will identify 2 physical and 2 cognitive deficits with Min A question cues. SLP Short Term Goal 5 (Week 2): Patient will utilize call bell to request assistance in 50% of opportunities with Mod A verbal and question cues.  SLP Short Term Goal 6 (Week 2): Patient will demonstrate orientation to place, time and situation with Min A visual and verbal cues.   Weekly Progress Updates: Patient has made minimal and inconsistent gains and has met 1 of 4 STG's this reporting period. Currently, patient demonstrates  increased ability to sustain attention to functional tasks and requires overall Min A for redirection which is further impacted by her verbosity and perseveration. Patient continues to require overall Mod-Max A multimodal cues to complete functional and familiar tasks safely in regards to recall of functional information, problem solving and awareness. Patient education is ongoing. Patient would benefit from continued skilled SLP intervention to maximize her cognitive function and overall functional independence prior to discharge.    Intensity: Minumum of 1-2 x/day, 30 to 90 minutes Frequency: 3 to 5 out of 7 days Duration/Length of Stay: TBD due SNF placement  Treatment/Interventions: Cognitive remediation/compensation;Internal/external aids;Cueing hierarchy;Patient/family education;Functional tasks;Therapeutic Activities;Environmental controls   Daily Session  Skilled Therapeutic Interventions: Skilled treatment session focused on cognitive goals. SLP facilitated session by providing Min A verbal cues for selective attention to self-feeding in a minimally distracting environment for ~15 minutes. Patient independently requested to use the bathroom and required Min A question and verbal cues for safety with task.  Patient utilized a simplified daily schedule provided by the clinician to anticipate upcoming therapy appointments with Min A visual and question cues and required Min A verbal cues for use of external aids for orientation to date. Patient was independently oriented to situation and verbalized need for TLSO due to recent surgery. Patient left supine in bed with all needs within reach and alarm on. Continue with current plan of care.       Function:  Cognition Comprehension Comprehension assist level: Understands basic 75 - 89% of the time/ requires cueing 10 - 24% of the time  Expression   Expression assist level: Expresses basic 75 - 89% of the time/requires cueing 10 - 24% of the  time. Needs helper to occlude trach/needs to repeat words.  Social Interaction Social Interaction assist level: Interacts appropriately 75 - 89% of the time - Needs redirection for appropriate language or to initiate interaction.  Problem Solving Problem solving assist level: Solves basic 50 - 74% of the time/requires cueing 25 - 49% of the time  Memory Memory assist level: Recognizes or recalls 50 - 74% of the time/requires cueing 25 - 49% of the time   Pain Pain Assessment Pain Assessment: 0-10 Pain Score: Asleep Faces Pain Scale: Hurts a little bit Pain Type: Surgical pain Pain Location: Back Pain Intervention(s): Repositioned  Therapy/Group: Individual Therapy  Clayden Withem 01/10/2016, 10:39 AM

## 2016-01-11 ENCOUNTER — Inpatient Hospital Stay (HOSPITAL_COMMUNITY): Payer: Medicare Other

## 2016-01-11 DIAGNOSIS — D62 Acute posthemorrhagic anemia: Secondary | ICD-10-CM

## 2016-01-11 DIAGNOSIS — G479 Sleep disorder, unspecified: Secondary | ICD-10-CM | POA: Diagnosis present

## 2016-01-11 DIAGNOSIS — G8918 Other acute postprocedural pain: Secondary | ICD-10-CM | POA: Diagnosis present

## 2016-01-11 DIAGNOSIS — I10 Essential (primary) hypertension: Secondary | ICD-10-CM

## 2016-01-11 MED ORDER — LIDOCAINE 5 % EX PTCH
1.0000 | MEDICATED_PATCH | CUTANEOUS | Status: DC
  Administered 2016-01-11: 1 via TRANSDERMAL
  Filled 2016-01-11: qty 1

## 2016-01-11 MED ORDER — TRAZODONE HCL 50 MG PO TABS
50.0000 mg | ORAL_TABLET | Freq: Every day | ORAL | Status: DC
  Administered 2016-01-11 – 2016-01-13 (×2): 50 mg via ORAL
  Filled 2016-01-11 (×2): qty 1

## 2016-01-11 NOTE — Plan of Care (Signed)
Problem: RH PAIN MANAGEMENT Goal: RH STG PAIN MANAGED AT OR BELOW PT'S PAIN GOAL Pain less than 5 on a scale form 0-10  Outcome: Not Progressing Patient complains of constant pain. Able to rest with PRN and lidocaine patches this pm.

## 2016-01-11 NOTE — Progress Notes (Signed)
Occupational Therapy Session Note  Patient Details  Name: [name] MRN: [id number] Date of Birth: 28-Apr-1946  Today's Date: 01/11/2016 OT Individual Time: 0900-0930 OT Individual Time Calculation (min): 30 min  and Today's Date: 01/11/2016 OT Missed Time: 30 Minutes Missed Time Reason: Pain     Short Term Goals: Week 2:  OT Short Term Goal 1 (Week 2): STG=LTG due to anticipated d/c to SNF  Skilled Therapeutic Interventions/Progress Updates:    Pt resting in bed upon arrival with RN present.  Pt stated that pain in back was "really bad" this morning and she was also anxious that she was going to have another asthma episode. Attempted to engage pt in BADLs/self care activities.  Pt stated she had a bathe yesterday with therapy and last night with nursing.  Pt declined X 3 to change from hospital gown to her clothing.  Pt perseverated on her medications and anxiety/asthma.  Pt declined to sit EOB to engage in grooming activities at bedside.  Pt missed 30 mins skilled OT services secondary to pain and pt's refusal to engage in skilled therapy session.   Therapy Documentation Precautions:  Precautions Precautions: Back, Fall Precaution Booklet Issued: Yes (comment) Precaution Comments: Pt with decreased awareness of precautions Required Braces or Orthoses: Spinal Brace Spinal Brace: Thoracolumbosacral orthotic, Applied in sitting position Restrictions Weight Bearing Restrictions: No General: General OT Amount of Missed Time: 30 Minutes Pain: Pt c/o 8/10 pain in back; K-pad in use and repositioned See Function Navigator for Current Functional Status.   Therapy/Group: Individual Therapy  Rich BraveLanier, Tajae Maiolo Chappell 01/11/2016, 9:41 AM

## 2016-01-11 NOTE — Progress Notes (Signed)
[place] [facility] PHYSICAL MEDICINE & REHABILITATION     PROGRESS NOTE    Subjective/Complaints: Pt seen laying in bed this AM.  She complains of pain all over.  She notes poor sleep as well.    ROS: +anxiety, diffuse pain, sleep disturbance. Denies CP, SOB, N/V/D.  Objective: Vital Signs: Blood pressure (!) 118/95, pulse 95, temperature 98.4 F (36.9 C), temperature source Oral, resp. rate 18, height 5\' 5"  (1.651 m), weight 69.3 kg (152 lb 12.5 oz), SpO2 97 %. No results found.  Recent Labs  01/10/16 0627  WBC 12.4*  HGB 8.7*  HCT 28.7*  PLT 395    Recent Labs  01/10/16 0627  NA 138  K 4.4  CL 109  GLUCOSE 73  BUN 8  CREATININE 1.07*  CALCIUM 9.5   CBG (last 3)  No results for input(s): GLUCAP in the last 72 hours.  Wt Readings from Last 3 Encounters:  01/02/16 69.3 kg (152 lb 12.5 oz)  12/30/15 66.1 kg (145 lb 11.6 oz)  12/27/15 61.8 kg (136 lb 4.8 oz)    Physical Exam:  Constitutional: no distress. Vital signs reviewed.  HENT: Normocephalic and atraumatic.  Eyes: Conjunctivae and EOM are normal.   Cardiovascular: Normal rate and regular rhythm. No murmur heard. Respiratory: Effort normal and breath sounds normal. No stridor. No respiratory distress. She has no wheezes.  GI: Soft. Bowel sounds are normal.  There is no tenderness.  Musculoskeletal: She exhibits no edema or tenderness.  Neurological: She is alert and oriented. STM memory deficits. Tangential.  Motor strength is 3/5 bilateral hip flexor, knee extensor, ankle dorsiflexor (? Pain inhibition)  Skin: Skin is warm and dry. Incisions clean/dry Psychiatric: Extremely tangential. Anxious.    Assessment/Plan: 1. 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 Physiatrist is providing close team supervision and 24 hour management  of active medical problems listed below. Physiatrist and rehab team continue to assess barriers to discharge/monitor patient progress toward functional and medical goals.  Function:  Bathing Bathing position   Position: Shower  Bathing parts Body parts bathed by patient: Right arm, Left arm, Chest, Abdomen, Front perineal area, Buttocks, Right upper leg, Left upper leg, Right lower leg, Left lower leg, Back Body parts bathed by helper: Back  Bathing assist Assist Level: Supervision or verbal cues (LH sponge)      Upper Body Dressing/Undressing Upper body dressing   What is the patient wearing?: Orthosis, Hospital gown Bra - Perfomed by patient: Thread/unthread right bra strap, Thread/unthread left bra strap, Hook/unhook bra (pull down sports bra) Bra - Perfomed by helper: Hook/unhook bra (pull down sports bra) Pull over shirt/dress - Perfomed by patient: Thread/unthread right sleeve, Thread/unthread left sleeve, Put head through opening, Pull shirt over trunk Pull over shirt/dress - Perfomed by helper: Thread/unthread right sleeve, Thread/unthread left sleeve     Orthosis activity level: Performed by helper  Upper body assist Assist Level: Supervision or verbal cues      Lower Body Dressing/Undressing Lower body dressing   What is the patient wearing?: Non-skid slipper socks, Hospital Gown Underwear - Performed by patient: Thread/unthread left underwear leg, Pull underwear up/down Underwear - Performed by helper: Thread/unthread right underwear leg Pants- Performed by patient: Thread/unthread right pants leg, Thread/unthread left pants leg, Pull pants up/down   Non-skid slipper socks- Performed by patient: Don/doff right sock, Don/doff left sock Non-skid slipper socks-  Performed by helper: Don/doff right sock, Don/doff left sock   Socks - Performed by helper: Don/doff right sock, Don/doff left sock Shoes - Performed by patient: Don/doff right shoe, Don/doff left shoe Shoes -  Performed by helper: Fasten right, Fasten left, Don/doff right shoe, Don/doff left shoe       TED Hose - Performed by helper: Don/doff right TED hose, Don/doff left TED hose  Lower body assist Assist for lower body dressing: Touching or steadying assistance (Pt > 75%)      Toileting Toileting   Toileting steps completed by patient: Adjust clothing prior to toileting, Performs perineal hygiene, Adjust clothing after toileting Toileting steps completed by helper: Adjust clothing prior to toileting Toileting Assistive Devices: Grab bar or rail  Toileting assist Assist level: Touching or steadying assistance (Pt.75%)   Transfers Chair/bed transfer   Chair/bed transfer method: Ambulatory Chair/bed transfer assist level: Supervision or verbal cues Chair/bed transfer assistive device: Armrests, Patent attorneyWalker     Locomotion Ambulation     Max distance: 12150ft  Assist level: Supervision or verbal cues   Wheelchair          Cognition Comprehension Comprehension assist level: Understands basic 75 - 89% of the time/ requires cueing 10 - 24% of the time  Expression Expression assist level: Expresses basic 75 - 89% of the time/requires cueing 10 - 24% of the time. Needs helper to occlude trach/needs to repeat words.  Social Interaction Social Interaction assist level: Interacts appropriately 50 - 74% of the time - May be physically or verbally inappropriate.  Problem Solving Problem solving assist level: Solves basic 50 - 74% of the time/requires cueing 25 - 49% of the time  Memory Memory assist level: Recognizes or recalls 50 - 74% of the time/requires cueing 25 - 49% of the time  Medical Problem List and Plan: 1.  Paraparesis secondary to lumbar spinal stenosis and spondylolisthesis status post L2-3, L3-4 lumbar decompression and fusion 12/16/2068  -Cont CIR  -will need supervision at discharge due to safety concerns -now SNF  -behavior/cognition near baseline per family 2.  DVT  Prophylaxis/Anticoagulation: Pharmaceutical: Lovenox 3. Pain Management: Previous mental status changes likely related to escalated narcotics, off Oxycontin BID. Dc scheduled oxy IR as well  -continue with celebrex bid and prn oxycodone for breakthrough pain.   -On gabapentin for neuropathy.   -continue kpad---would prefer to use this as oppose to oxycodone  -Lidoderm patch ordered for back 4. Mood: Team to provide ego support. LCSW to follow for evaluation and support.   -sleep chart 5. Neuropsych: This patient is capable of making decisions on her own behalf.  -some improvement with decreased narcs 6. Skin/Wound Care: Monitor back wound daily for healing 7. Fluids/Electrolytes/Nutrition:   -Cr elevated  -encourage fluids 8. HTN: Monitor BP bid. holding lasix due to hypotension. Continue Cardizem with parameters.   10 COPD/Asthma:  Encourage IS with fluter valve. Respiratory status stable on Dulera, Spiriva and Singulair.  11. CKD:   Avoid nephrotoxic medications  12. Constipation: responsive to meds 13. Post-op anemia:   -hgb 8.7 on 8/25 14. Sleep disturbance   Trazodone schedueld  LOS (Days) 9 A FACE TO FACE EVALUATION WAS PERFORMED  Ankit Karis Jubanil Patel 01/11/2016 10:52 AM

## 2016-01-12 ENCOUNTER — Inpatient Hospital Stay (HOSPITAL_COMMUNITY): Payer: Medicare Other | Admitting: *Deleted

## 2016-01-12 DIAGNOSIS — M792 Neuralgia and neuritis, unspecified: Secondary | ICD-10-CM

## 2016-01-12 DIAGNOSIS — F411 Generalized anxiety disorder: Secondary | ICD-10-CM

## 2016-01-12 MED ORDER — LIDOCAINE 5 % EX PTCH
2.0000 | MEDICATED_PATCH | CUTANEOUS | Status: DC
Start: 2016-01-12 — End: 2016-01-14
  Administered 2016-01-12 – 2016-01-14 (×3): 2 via TRANSDERMAL
  Filled 2016-01-12 (×3): qty 2

## 2016-01-12 MED ORDER — GABAPENTIN 300 MG PO CAPS
600.0000 mg | ORAL_CAPSULE | Freq: Three times a day (TID) | ORAL | Status: DC
  Administered 2016-01-12 – 2016-01-14 (×7): 600 mg via ORAL
  Filled 2016-01-12 (×7): qty 2

## 2016-01-12 NOTE — Progress Notes (Signed)
Physical Therapy Session Note  Patient Details  Name: [name] A [place]edge MRN: [id number] Date of Birth: 1946-01-24  Today's Date: 01/12/2016 PT Individual Time: [id number] PT Individual Time Calculation (min): 60 min      Skilled Therapeutic Interventions/Progress Updates: Patient in bed at the beginning of session, convinced she has jumped out of bed this morning and hurt her back, no documentation or confirmation of above event found. Patient very confused and complaining of pain all over. With redirection agrees to therapy session. Coming to sit  EOB with supervision and subtle cueing for proper technique. TLSO donned with max A form therapist .  Transfer to w/c with Supervision. Patient nota bel to recall back precautions this am.  Transferred to gym via w/c.  NuStep x 10 min at resistance level in order to increase strength activity tolerance and facilitate reciprocal movements needed for appropriate gait pattern.   Gait training 1 x 100 feet and 1 x 150 feet with supervision and cues for posture and step clearance, with cues patient able to correct gait pattern but gets distracted easily.  Therapeutic exercises in sitting with resistance band for quad and hamstrings 2 x 10 each rep.  In standing hip abd , and mini squats 2 x 7.  Patient returned yto room , and to supine in bed, all needs within reach and bed alarm on .   Therapy Documentation Precautions:  Precautions Precautions: Back, Fall Precaution Booklet Issued: Yes (comment) Precaution Comments: Pt with decreased awareness of precautions Required Braces or Orthoses: Spinal Brace Spinal Brace: Thoracolumbosacral orthotic, Applied in sitting position Restrictions Weight Bearing Restrictions: No    See Function Navigator for Current Functional Status.   Therapy/Group: Individual Therapy  Dorna MaiCzajkowska, Christipher Rieger W 01/12/2016, 8:58 AM

## 2016-01-12 NOTE — Progress Notes (Signed)
Increased paranoia, feels staff are putting drugs in her food and drinks. Refused to drink out of water pitcher, new one given, she put old one beside bed for "evidence". Constantly requesting pain meds, although received within a hour. Poor sleep at night. Initially refused scheduled sleeping med. Refused singulair, " I don't do genetics!" Perseverating on  Pain and pain meds. Easily distracted and difficult to redirect at times.Mackenzie MartinezMurray, Inga Noller A

## 2016-01-12 NOTE — Progress Notes (Signed)
[facility] PHYSICAL MEDICINE & REHABILITATION     PROGRESS NOTE    Subjective/Complaints: Pt seen laying in bed.  Per nursing, pt skeptical and distrusting of staff overnight, refusing to take medications.  Pt states she is in pain all over.  She notes the patches help.  She is tangential.    ROS: +anxiety, diffuse pain, sleep disturbance. Denies CP, SOB, N/V/D.  Objective: Vital Signs: Blood pressure (!) 156/101, pulse 99, temperature 99 F (37.2 C), temperature source Oral, resp. rate 18, height 5\' 5"  (1.651 m), weight 69.3 kg (152 lb 12.5 oz), SpO2 98 %. No results found.  Recent Labs  01/10/16 0627  WBC 12.4*  HGB 8.7*  HCT 28.7*  PLT 395    Recent Labs  01/10/16 0627  NA 138  K 4.4  CL 109  GLUCOSE 73  BUN 8  CREATININE 1.07*  CALCIUM 9.5   CBG (last 3)  No results for input(s): GLUCAP in the last 72 hours.  Wt Readings from Last 3 Encounters:  01/02/16 69.3 kg (152 lb 12.5 oz)  12/30/15 66.1 kg (145 lb 11.6 oz)  12/27/15 61.8 kg (136 lb 4.8 oz)    Physical Exam:  Constitutional: no distress. Vital signs reviewed.  HENT: Normocephalic and atraumatic.  Eyes: Conjunctivae and EOM are normal.   Cardiovascular: Normal rate and regular rhythm. No murmur heard. Respiratory: Effort normal and breath sounds normal. No stridor. No respiratory distress. She has no wheezes.  GI: Soft. Bowel sounds are normal.  There is no tenderness.  Musculoskeletal: She exhibits no edema or tenderness.  Neurological: She is alert and oriented. STM memory deficits. Tangential.  Motor strength is 3/5 bilateral hip flexor, knee extensor, ankle dorsiflexor (? Pain inhibition)  Skin: Skin is warm and dry. Incisions clean/dry Psychiatric: Extremely tangential. Anxious.    Assessment/Plan: 1. Paraparesis and functional deficits secondary to lumbar spinal stenosis/spondylolisthesis s/p L2-4 decompression and fusion which require 3+ hours per day of interdisciplinary therapy in a  comprehensive inpatient rehab setting. Physiatrist is providing close team supervision and 24 hour management of active medical problems listed below. Physiatrist and rehab team continue to assess barriers to discharge/monitor patient progress toward functional and medical goals.  Function:  Bathing Bathing position   Position: Shower  Bathing parts Body parts bathed by patient: Right arm, Left arm, Chest, Abdomen, Front perineal area, Buttocks, Right upper leg, Left upper leg, Right lower leg, Left lower leg, Back Body parts bathed by helper: Back  Bathing assist Assist Level: Supervision or verbal cues (LH sponge)      Upper Body Dressing/Undressing Upper body dressing   What is the patient wearing?: Orthosis, Hospital gown Bra - Perfomed by patient: Thread/unthread right bra strap, Thread/unthread left bra strap, Hook/unhook bra (pull down sports bra) Bra - Perfomed by helper: Hook/unhook bra (pull down sports bra) Pull over shirt/dress - Perfomed by patient: Thread/unthread right sleeve, Thread/unthread left sleeve, Put head through opening, Pull shirt over trunk Pull over shirt/dress - Perfomed by helper: Thread/unthread right sleeve, Thread/unthread left sleeve     Orthosis activity level: Performed by helper  Upper body assist Assist Level: Supervision or verbal cues      Lower Body Dressing/Undressing Lower body dressing   What is the patient wearing?: Non-skid slipper socks, Hospital Gown Underwear - Performed by patient: Thread/unthread left underwear leg, Pull underwear up/down Underwear - Performed by helper: Thread/unthread right underwear leg Pants- Performed by patient: Thread/unthread right pants leg, Thread/unthread left pants leg, Pull pants up/down  Non-skid slipper socks- Performed by patient: Don/doff right sock, Don/doff left sock Non-skid slipper socks- Performed by helper: Don/doff right sock, Don/doff left sock   Socks - Performed by helper: Don/doff  right sock, Don/doff left sock Shoes - Performed by patient: Don/doff right shoe, Don/doff left shoe Shoes - Performed by helper: Fasten right, Fasten left, Don/doff right shoe, Don/doff left shoe       TED Hose - Performed by helper: Don/doff right TED hose, Don/doff left TED hose  Lower body assist Assist for lower body dressing: Touching or steadying assistance (Pt > 75%)      Toileting Toileting   Toileting steps completed by patient: Adjust clothing prior to toileting, Performs perineal hygiene, Adjust clothing after toileting Toileting steps completed by helper: Adjust clothing prior to toileting Toileting Assistive Devices: Grab bar or rail  Toileting assist Assist level: Touching or steadying assistance (Pt.75%)   Transfers Chair/bed transfer   Chair/bed transfer method: Ambulatory Chair/bed transfer assist level: Supervision or verbal cues Chair/bed transfer assistive device: Armrests, Patent attorney     Max distance: 142ft  Assist level: Supervision or verbal cues   Wheelchair          Cognition Comprehension Comprehension assist level: Understands basic 75 - 89% of the time/ requires cueing 10 - 24% of the time  Expression Expression assist level: Expresses basic 75 - 89% of the time/requires cueing 10 - 24% of the time. Needs helper to occlude trach/needs to repeat words.  Social Interaction Social Interaction assist level: Interacts appropriately 50 - 74% of the time - May be physically or verbally inappropriate.  Problem Solving Problem solving assist level: Solves basic 50 - 74% of the time/requires cueing 25 - 49% of the time  Memory Memory assist level: Recognizes or recalls 50 - 74% of the time/requires cueing 25 - 49% of the time  Medical Problem List and Plan: 1.  Paraparesis secondary to lumbar spinal stenosis and spondylolisthesis status post L2-3, L3-4 lumbar decompression and fusion 12/16/2068  -Cont CIR  -will need supervision at  discharge due to safety concerns -now SNF  -behavior/cognition near baseline per family 2.  DVT Prophylaxis/Anticoagulation: Pharmaceutical: Lovenox 3. Pain Management: Previous mental status changes likely related to escalated narcotics, off Oxycontin BID. Dc scheduled oxy IR as well  -continue with celebrex bid and prn oxycodone for breakthrough pain.     -On gabapentin for neuropathy, increased on 8/27  -continue kpad---would prefer to use this as oppose to oxycodone  -Lidoderm patch ordered for back increase to 2 patches on 8/27 4. Mood: Team to provide ego support. LCSW to follow for evaluation and support.   -sleep chart 5. Neuropsych: This patient is capable of making decisions on her own behalf.  -some improvement with decreased narcs 6. Skin/Wound Care: Monitor back wound daily for healing 7. Fluids/Electrolytes/Nutrition:   -Cr elevated  -encourage fluids 8. HTN: Monitor BP bid. holding lasix due to hypotension. Continue Cardizem with parameters.   10 COPD/Asthma:  Encourage IS with fluter valve. Respiratory status stable on Dulera, Spiriva and Singulair.  11. CKD:   Avoid nephrotoxic medications  12. Constipation: responsive to meds 13. Post-op anemia:   -hgb 8.7 on 8/25 14. Sleep disturbance   Trazodone scheduled, pt refused overnight, educated pt  Greater than 25 minutes spent with pt with greater than 20 minutes in counseling.   LOS (Days) 10 A FACE TO FACE EVALUATION WAS PERFORMED  Ankit Karis Juba 01/12/2016 9:11 AM

## 2016-01-13 ENCOUNTER — Inpatient Hospital Stay (HOSPITAL_COMMUNITY): Payer: Medicare Other | Admitting: Occupational Therapy

## 2016-01-13 ENCOUNTER — Inpatient Hospital Stay (HOSPITAL_COMMUNITY): Payer: Medicare Other | Admitting: Physical Therapy

## 2016-01-13 ENCOUNTER — Inpatient Hospital Stay (HOSPITAL_COMMUNITY): Payer: Medicare Other | Admitting: Speech Pathology

## 2016-01-13 MED ORDER — GABAPENTIN 300 MG PO CAPS: 600.0000 mg | ORAL_CAPSULE | Freq: Three times a day (TID) | ORAL | Status: DC

## 2016-01-13 MED ORDER — TRAZODONE HCL 50 MG PO TABS: 50.0000 mg | ORAL_TABLET | Freq: Every day | ORAL | Status: DC

## 2016-01-13 MED ORDER — CELECOXIB 200 MG PO CAPS: 200.0000 mg | ORAL_CAPSULE | Freq: Every day | ORAL | Status: DC

## 2016-01-13 MED ORDER — PANTOPRAZOLE SODIUM 20 MG PO TBEC: 20.0000 mg | DELAYED_RELEASE_TABLET | Freq: Every day | ORAL | Status: DC

## 2016-01-13 MED ORDER — ACETAMINOPHEN 325 MG PO TABS: 325.0000 mg | ORAL_TABLET | ORAL | Status: DC | PRN

## 2016-01-13 MED ORDER — OXYCODONE HCL 5 MG PO TABS: 5.0000 mg | ORAL_TABLET | Freq: Four times a day (QID) | ORAL | 0 refills | Status: DC | PRN

## 2016-01-13 MED ORDER — LIDOCAINE 5 % EX PTCH: 2.0000 | MEDICATED_PATCH | CUTANEOUS | 0 refills | Status: DC

## 2016-01-13 MED ORDER — POLYETHYLENE GLYCOL 3350 17 G PO PACK: 17.0000 g | PACK | Freq: Two times a day (BID) | ORAL | 0 refills | Status: DC

## 2016-01-13 NOTE — Progress Notes (Signed)
Social Work Patient ID: [name], female   DOB: 23-Dec-1945, 70 y.o.   MRN: [id number]   [facility].  The room will be ready tomorrow so will plan d/c after lunch tomorrow.  Tx team aware.  Vince Ainsley, LCSW

## 2016-01-13 NOTE — Progress Notes (Signed)
Physical Therapy Weekly Progress Note  Patient Details  Name: [name] MRN: [id number] Date of Birth: 28-Mar-1946  Beginning of progress report period: January 03, 2016 End of progress report period: January 13, 2016  Today's Date: 01/13/2016 PT Individual Time: 0800-0900 PT Individual Time Calculation (min): 60 min    Patient has met 3 of 9 long term goals.  Short term goals not set due to estimated length of stay.  Pt requires min guard overall for dynamic standing balance, gait, transfers, and stairs. Pt with cognitive impairments including decreased short term memory and recall, decreased attention.   Patient continues to demonstrate the following deficits: activity tolerance, balance, postural control, ability to compensate for deficits, functional use of  right lower extremity and left lower extremity, attention, awareness, coordination and knowledge of precautions and therefore will continue to benefit from skilled PT intervention to enhance overall performance with bed mobility, transfers, gait, stairs, home and community access.  See Patient's Care Plan for progression toward long term goals.  Patient progressing toward long term goals..  Continue plan of care.   Skilled Therapeutic Interventions/Progress Updates:   Pt received supine in bed, c/o 10/10 pain in back and requesting medication however agreeable to treatment. Reports problems with not having bowel movement and needing to get up often to try and use the bathroom. Supine>sit with modI. Sit <>stand to straighten gown under TLSO and don second gown with min guard for standing balance. Pt able to name 1/3 back precautions (twisting only); educated in remaining precautions. Pt donned B shoes on EOB with S and increased time. Required cues for attention/safety when getting up to take flowers to the sink to water after tieing one shoe but not the other. 2x10 reps sit <>stand no UE assist from EOB while waiting on RN to arrive  with medication. Gait to gym with min guard x150' and min cues for foot clearance and attention to task with pt easily distracted. Standing balance in parallel bars on airex with normal and narrow BOS, eyes open/closed for sensory integration and ankle strategy. Standing heel raises and hip abduction 2x15 each with light UE support. Nustep x8 min level 5 with BUE/BLE for LE strengthening, coordination, aerobic conditioning. Gait to return to room with side stepping and backwards walking for LE coordination and strengthening. Remained supine in bed at end of session, alarm intact and all needs in reach.   Therapy Documentation Precautions:  Precautions Precautions: Back, Fall Precaution Booklet Issued: Yes (comment) Precaution Comments: Pt with decreased awareness of precautions Required Braces or Orthoses: Spinal Brace Spinal Brace: Thoracolumbosacral orthotic, Applied in sitting position Restrictions Weight Bearing Restrictions: No Pain: Pain Assessment Pain Assessment: 0-10 Pain Score: 10 Pain Type: Acute pain Pain Location: Leg Pain Orientation: Right;Left Pain Descriptors / Indicators: Aching;Burning Pain Frequency: Constant Pain Onset: On-going Patients Stated Pain Goal: 2 Pain Intervention(s): Medication (See eMAR);Repositioned Multiple Pain Sites: No   See Function Navigator for Current Functional Status.  Therapy/Group: Individual Therapy  Luberta Mutter 01/13/2016, 8:45 AM

## 2016-01-13 NOTE — Progress Notes (Signed)
Patient slept great, reporting pain better managed. PRN oxy IR, only used 2 times on this shift. Trazodone not given, patient asleep. Minimal perseveration and no hallucinations!!!! Mackenzie MartinezMurray, [name]

## 2016-01-13 NOTE — Progress Notes (Signed)
Speech Language Pathology Daily Session Note  Patient Details  Name: [name] MRN: [id number] Date of Birth: 01-02-1946  Today's Date: 01/13/2016 SLP Individual Time: 1100-1200 SLP Individual Time Calculation (min): 60 min   Short Term Goals: Week 2: SLP Short Term Goal 1 (Week 2): Patient will demonstrate sustained attention to a functional task for 10 minutes with Min A verbal cues for redirection.  SLP Short Term Goal 2 (Week 2): Patient will recall back precautions with Min A question cues.  SLP Short Term Goal 3 (Week 2): Patient will demonstrate basic problem solving for functional and familiar tasks with Min A verbal cues.  SLP Short Term Goal 4 (Week 2): Patient will identify 2 physical and 2 cognitive deficits with Min A question cues. SLP Short Term Goal 5 (Week 2): Patient will utilize call bell to request assistance in 50% of opportunities with Mod A verbal and question cues.  SLP Short Term Goal 6 (Week 2): Patient will demonstrate orientation to place, time and situation with Min A visual and verbal cues.   Skilled Therapeutic Interventions:Skilled therapy intervention focused on cognitive goals and awareness of physical limitations. Patient shows intellectual awareness of spine injury precautions, but does not self-monitor behavior during functional movement. Patient was perseverative and tangential when clinician attempted to direct attention to the functional task of comparing/contrasting rehabilitation facilities so patient could choose one for anticipated d/c tomorrow. Given Max A, patient able to sustain attention to task for < 1 minute. Patient left seated upright in bed with all needs within reach and nurse in room. Continue with current plan of care.   Function:   Cognition Comprehension Comprehension assist level: Understands basic 90% of the time/cues < 10% of the time  Expression   Expression assist level: Expresses basic 75 - 89% of the time/requires cueing  10 - 24% of the time. Needs helper to occlude trach/needs to repeat words.  Social Interaction Social Interaction assist level: Interacts appropriately 25 - 49% of time - Needs frequent redirection.  Problem Solving Problem solving assist level: Solves basic 50 - 74% of the time/requires cueing 25 - 49% of the time  Memory Memory assist level: Recognizes or recalls 50 - 74% of the time/requires cueing 25 - 49% of the time    Pain Pain Assessment Pain Assessment: No/denies pain  Therapy/Group: Individual Therapy  Caryn Sectionlison Cincere Zorn 01/13/2016, 4:03 PM

## 2016-01-13 NOTE — Progress Notes (Signed)
[facility] PHYSICAL MEDICINE & REHABILITATION     PROGRESS NOTE    Subjective/Complaints: Had a good night. Weekend up and down. Behavior erratic at times  ROS: +anxiety, diffuse pain, sleep disturbance. Denies CP, SOB, N/V/D.  Objective: Vital Signs: Blood pressure 112/82, pulse 92, temperature 99.1 F (37.3 C), temperature source Oral, resp. rate 16, height 5\' 5"  (1.651 m), weight 69.3 kg (152 lb 12.5 oz), SpO2 96 %. No results found. No results for input(s): WBC, HGB, HCT, PLT in the last 72 hours. No results for input(s): NA, K, CL, GLUCOSE, BUN, CREATININE, CALCIUM in the last 72 hours.  Invalid input(s): CO CBG (last 3)  No results for input(s): GLUCAP in the last 72 hours.  Wt Readings from Last 3 Encounters:  01/02/16 69.3 kg (152 lb 12.5 oz)  12/30/15 66.1 kg (145 lb 11.6 oz)  12/27/15 61.8 kg (136 lb 4.8 oz)    Physical Exam:  Constitutional: no distress. Vital signs reviewed.  HENT: Normocephalic and atraumatic.  Eyes: Conjunctivae and EOM are normal.   Cardiovascular: Normal rate and regular rhythm. No murmur heard. Respiratory: Effort normal and breath sounds normal. No stridor. No respiratory distress. She has no wheezes.  GI: Soft. Bowel sounds are normal.  There is no tenderness.  Musculoskeletal: She exhibits no edema or tenderness.  Neurological: She is alert and oriented. STM memory deficits. Tangential.  Motor strength is 3/5 bilateral hip flexor, knee extensor, ankle dorsiflexor (? Pain inhibition)  Skin: Skin is warm and dry. Incisions clean/dry Psychiatric: Extremely tangential. Anxious.    Assessment/Plan: 1. 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 Physiatrist is providing close team supervision and 24 hour management of active medical problems listed  below. Physiatrist and rehab team continue to assess barriers to discharge/monitor patient progress toward functional and medical goals.  Function:  Bathing Bathing position   Position: Shower  Bathing parts Body parts bathed by patient: Right arm, Left arm, Chest, Abdomen, Front perineal area, Buttocks, Right upper leg, Left upper leg, Right lower leg, Left lower leg, Back Body parts bathed by helper: Back  Bathing assist Assist Level: Supervision or verbal cues (LH sponge)      Upper Body Dressing/Undressing Upper body dressing   What is the patient wearing?: Orthosis, Hospital gown Bra - Perfomed by patient: Thread/unthread right bra strap, Thread/unthread left bra strap, Hook/unhook bra (pull down sports bra) Bra - Perfomed by helper: Hook/unhook bra (pull down sports bra) Pull over shirt/dress - Perfomed by patient: Thread/unthread right sleeve, Thread/unthread left sleeve, Put head through opening, Pull shirt over trunk Pull over shirt/dress - Perfomed by helper: Thread/unthread right sleeve, Thread/unthread left sleeve     Orthosis activity level: Performed by helper  Upper body assist Assist Level: Supervision or verbal cues      Lower Body Dressing/Undressing Lower body dressing   What is the patient wearing?: Shoes Underwear - Performed by patient: Thread/unthread left underwear leg, Pull underwear up/down Underwear - Performed by helper: Thread/unthread right underwear leg Pants- Performed by patient: Thread/unthread right pants leg, Thread/unthread left pants leg, Pull pants up/down   Non-skid slipper socks- Performed by patient: Don/doff right sock, Don/doff left sock Non-skid slipper socks- Performed by helper: Don/doff right sock, Don/doff left sock   Socks - Performed by helper: Don/doff right sock, Don/doff left sock Shoes - Performed by patient: Don/doff right shoe, Don/doff left  shoe, Fasten right, Fasten left Shoes - Performed by helper: Fasten right, Fasten  left, Don/doff right shoe, Don/doff left shoe       TED Hose - Performed by helper: Don/doff right TED hose, Don/doff left TED hose  Lower body assist Assist for lower body dressing: Touching or steadying assistance (Pt > 75%)      Toileting Toileting   Toileting steps completed by patient: Adjust clothing prior to toileting, Performs perineal hygiene, Adjust clothing after toileting Toileting steps completed by helper: Adjust clothing prior to toileting Toileting Assistive Devices: Grab bar or rail  Toileting assist Assist level: Touching or steadying assistance (Pt.75%)   Transfers Chair/bed transfer   Chair/bed transfer method: Ambulatory Chair/bed transfer assist level: Supervision or verbal cues Chair/bed transfer assistive device: Armrests     Locomotion Ambulation     Max distance: 150 Assist level: Touching or steadying assistance (Pt > 75%)   Wheelchair          Cognition Comprehension Comprehension assist level: Understands basic 50 - 74% of the time/ requires cueing 25 - 49% of the time  Expression Expression assist level: Expresses basic 75 - 89% of the time/requires cueing 10 - 24% of the time. Needs helper to occlude trach/needs to repeat words.  Social Interaction Social Interaction assist level: Interacts appropriately 50 - 74% of the time - May be physically or verbally inappropriate., Interacts appropriately 25 - 49% of time - Needs frequent redirection.  Problem Solving Problem solving assist level: Solves basic 50 - 74% of the time/requires cueing 25 - 49% of the time  Memory Memory assist level: Recognizes or recalls 50 - 74% of the time/requires cueing 25 - 49% of the time  Medical Problem List and Plan: 1.  Paraparesis secondary to lumbar spinal stenosis and spondylolisthesis status post L2-3, L3-4 lumbar decompression and fusion 12/16/2068  -Cont CIR  -will need supervision at discharge due to safety concerns -now SNF  -behavior/cognition near  baseline per family 2.  DVT Prophylaxis/Anticoagulation: Pharmaceutical: Lovenox 3. Pain Management: Previous mental status changes likely related to escalated narcotics, off Oxycontin BID. Dc scheduled oxy IR as well  -continue with celebrex bid and prn oxycodone for breakthrough pain.     -On gabapentin for neuropathy, increased on 8/27  -continue kpad--for back/trunk pain  -Lidoderm patch ordered for back increase to 2 patches on 8/27 4. Mood: Team to provide ego support. LCSW to follow for evaluation and support.     5. Neuropsych: This patient is capable of making decisions on her own behalf.  -some improvement with decreased narcs 6. Skin/Wound Care: Monitor back wound daily for healing 7. Fluids/Electrolytes/Nutrition:   -Cr back to baseline  -continue to encourage fluids 8. HTN: Monitor BP bid. holding lasix due to hypotension. Continue Cardizem with parameters.   10 COPD/Asthma:  Encourage IS with fluter valve. Respiratory status stable on Dulera, Spiriva and Singulair.  11. CKD:   Avoid nephrotoxic medications  12. Constipation: responsive to meds 13. Post-op anemia:   -hgb 8.7 on 8/25 14. Sleep disturbance   Trazodone scheduled, pt refuses at times   n 25 minutes spent with pt with greater than 20 minutes in counseling.   LOS (Days) 11 A FACE TO FACE EVALUATION WAS PERFORMED  Daryan Buell T 01/13/2016 9:14 AM
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
## 2016-01-13 NOTE — Progress Notes (Signed)
Occupational Therapy Session Note  Patient Details  Name: [name] A Huberty MRN: [id number] Date of Birth: 02/15/1946  Today's Date: 01/13/2016 OT Individual Time: [id number] OT Individual Time Calculation (min): 75 min     Short Term Goals:Week 2:  OT Short Term Goal 1 (Week 2): STG=LTG due to anticipated d/c to SNF  Skilled Therapeutic Interventions/Progress Updates:    Pt seen for OT ADL bathing/dressing Pt in supine upon arrival, agreeable to tx session. Throughout session ambulated with close supervision- CGA. Pt required max cuing throughout session for attention to task as she is hyperverbal throughout. With basic one step directions, pt able to navigate unit to obtain towels and gown in prep for showering task.  She bathed seated on shower chair with TLSO donned for safety as pt with decreased adherence to spinal precautions during functional tasks.  She returned to EOB to complete grooming and dressing tasks.  Pt then completed memory/ route finding activity. Pt given verbalize directions to locate ADL apartment. She then requested to write down directions to assist, demonstrating improved awareness and good functional problem solving skills. Pt able to locate apartment with use of direction and min VCs for attention to task. Pt returned to room at end of session, requesting to remain sitting EOB. Pt left with all needs in reach and bed alarm on.  She was educated throughout session regarding role of OT,PT, and SLP, spinal precautions, and d/c planning.   Therapy Documentation Precautions:  Precautions Precautions: Back, Fall Precaution Booklet Issued: Yes (comment) Precaution Comments: Pt with decreased awareness of precautions Required Braces or Orthoses: Spinal Brace Spinal Brace: Thoracolumbosacral orthotic, Applied in sitting position Restrictions Weight Bearing Restrictions: No Pain: Pain Assessment Pain Assessment: 0-10 Pain Score:Pt did not formally address pain this  session.   See Function Navigator for Current Functional Status.   Therapy/Group: Individual Therapy  Lewis, Devorah Givhan C 01/13/2016, 7:12 AM

## 2016-01-13 NOTE — Progress Notes (Signed)
Occupational Therapy Discharge Summary  Patient Details  Name: [name] MRN: [id number] Date of Birth: 25-Dec-1945   Patient has met 7 of 7 long term goals due to improved activity tolerance, improved balance, improved attention, improved awareness and improved coordination.  Patient to discharge at overall supervision- Min Assist level.  Patient's family unable to provide the needed 24 hour supervision assistant pt requires due to cognitive impairments and therefore will be d/cing to SNF.    Recommendation:  Patient will benefit from ongoing skilled OT services in skilled nursing facility setting to continue to advance functional skills in the area of BADL, iADL and Reduce care partner burden.  Equipment: No equipment provided To be determined at next venue of care.   Reasons for discharge: treatment goals met and discharge from hospital  Patient/family agrees with progress made and goals achieved: Yes  OT Discharge Precautions/Restrictions  Precautions Precautions: Back;Fall Precaution Comments: Pt unable to consistently recall back precautions and requires max cuing to maintain during functional tasks.  Required Braces or Orthoses: Spinal Brace Spinal Brace: Thoracolumbosacral orthotic;Applied in sitting position Restrictions Weight Bearing Restrictions: No Vision/Perception  Vision- History Baseline Vision/History: Wears glasses Wears Glasses: At all times Patient Visual Report: No change from baseline Vision- Assessment Vision Assessment?: No apparent visual deficits  Cognition Overall Cognitive Status: Impaired/Different from baseline Arousal/Alertness: Awake/alert Orientation Level: Oriented to person;Oriented to place;Oriented to situation Memory: Impaired Memory Impairment: Decreased recall of new information;Retrieval deficit;Storage deficit Awareness: Impaired Problem Solving: Impaired Problem Solving Impairment: Verbal basic;Functional basic Behaviors:  Impulsive (Decreased safety awareness) Safety/Judgment: Impaired Sensation Sensation Light Touch: Appears Intact Proprioception: Appears Intact Coordination Gross Motor Movements are Fluid and Coordinated: Yes Fine Motor Movements are Fluid and Coordinated: Yes Motor  Motor Motor: Within Functional Limits Trunk/Postural Assessment  Cervical Assessment Cervical Assessment: Within Functional Limits Thoracic Assessment Thoracic Assessment: Exceptions to WFL (TLSO; spinal pre-cautions) Lumbar Assessment Lumbar Assessment: Exceptions to WFL (TLSO; spinal pre-cautions) Postural Control Postural Control: Within Functional Limits  Balance Balance Balance Assessed: Yes Dynamic Sitting Balance Dynamic Sitting - Balance Support: Feet supported;During functional activity Dynamic Sitting - Level of Assistance: 6: Modified independent (Device/Increase time) Static Standing Balance Static Standing - Balance Support: During functional activity;No upper extremity supported Static Standing - Level of Assistance: 5: Stand by assistance Dynamic Standing Balance Dynamic Standing - Balance Support: During functional activity;Right upper extremity supported;Left upper extremity supported Dynamic Standing - Level of Assistance: 5: Stand by assistance Extremity/Trunk Assessment RUE Assessment RUE Assessment: Within Functional Limits LUE Assessment LUE Assessment: Within Functional Limits   See Function Navigator for Current Functional Status.  Bobby Rumpf, Joylene Wescott C 01/13/2016, 3:35 PM

## 2016-01-14 ENCOUNTER — Inpatient Hospital Stay (HOSPITAL_COMMUNITY): Payer: Medicare Other | Admitting: Physical Therapy

## 2016-01-14 NOTE — Plan of Care (Signed)
Problem: RH Balance Goal: LTG Patient will maintain dynamic standing balance (PT) LTG:  Patient will maintain dynamic standing balance with assistance during mobility activities (PT)  Outcome: Not Met (add Reason) Recommend S

## 2016-01-14 NOTE — Progress Notes (Signed)
[facility] PHYSICAL MEDICINE & REHABILITATION     PROGRESS NOTE    Subjective/Complaints: No new issues. In good spirits this morning  ROS:  Denies CP, SOB, N/V/D.  Objective: Vital Signs: Blood pressure (!) 147/86, pulse 88, temperature 99 F (37.2 C), temperature source Oral, resp. rate 18, height 5\' 5"  (1.651 m), weight 69.3 kg (152 lb 12.5 oz), SpO2 96 %. No results found. No results for input(s): WBC, HGB, HCT, PLT in the last 72 hours. No results for input(s): NA, K, CL, GLUCOSE, BUN, CREATININE, CALCIUM in the last 72 hours.  Invalid input(s): CO CBG (last 3)  No results for input(s): GLUCAP in the last 72 hours.  Wt Readings from Last 3 Encounters:  01/02/16 69.3 kg (152 lb 12.5 oz)  12/30/15 66.1 kg (145 lb 11.6 oz)  12/27/15 61.8 kg (136 lb 4.8 oz)    Physical Exam:  Constitutional: no distress. Vital signs reviewed.  HENT: Normocephalic and atraumatic.  Eyes: Conjunctivae and EOM are normal.   Cardiovascular: Normal rate and regular rhythm. No murmur heard. Respiratory: Effort normal and breath sounds normal. No stridor. No respiratory distress. She has no wheezes.  GI: Soft. Bowel sounds are normal.  There is no tenderness.  Musculoskeletal: She exhibits no edema or tenderness.  Neurological: She is alert and oriented. STM memory deficits. Tangential.  Motor strength is 3/5 bilateral hip flexor, knee extensor, ankle dorsiflexor (? Pain inhibition)  Skin: Skin is warm and dry. Incisions clean/dry Psychiatric: Extremely tangential. Anxious.    Assessment/Plan: 1. 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 Physiatrist is providing close team supervision and 24 hour management of active medical problems listed below. Physiatrist and rehab team continue to assess barriers to  discharge/monitor patient progress toward functional and medical goals.  Function:  Bathing Bathing position   Position: Shower  Bathing parts Body parts bathed by patient: Right arm, Left arm, Chest, Abdomen, Front perineal area, Buttocks, Right upper leg, Left upper leg, Right lower leg, Left lower leg, Back Body parts bathed by helper: Back  Bathing assist Assist Level: Supervision or verbal cues      Upper Body Dressing/Undressing Upper body dressing   What is the patient wearing?: Orthosis, Hospital gown Bra - Perfomed by patient: Thread/unthread right bra strap, Thread/unthread left bra strap, Hook/unhook bra (pull down sports bra) Bra - Perfomed by helper: Hook/unhook bra (pull down sports bra) Pull over shirt/dress - Perfomed by patient: Thread/unthread right sleeve, Thread/unthread left sleeve, Put head through opening, Pull shirt over trunk Pull over shirt/dress - Perfomed by helper: Thread/unthread right sleeve, Thread/unthread left sleeve     Orthosis activity level: Performed by helper  Upper body assist Assist Level: Set up   Set up : To apply TLSO, cervical collar  Lower Body Dressing/Undressing Lower body dressing   What is the patient wearing?: Non-skid slipper socks Underwear - Performed by patient: Thread/unthread left underwear leg, Pull underwear up/down Underwear - Performed by helper: Thread/unthread right underwear leg Pants- Performed by patient: Thread/unthread right pants leg, Thread/unthread left pants leg, Pull pants up/down   Non-skid slipper socks- Performed by patient: Don/doff right sock, Don/doff left sock Non-skid slipper socks- Performed by helper: Don/doff right sock, Don/doff left sock   Socks - Performed by helper: Don/doff right sock, Don/doff left sock Shoes - Performed by patient: Don/doff right shoe, Don/doff left shoe, Fasten right,  Fasten left Shoes - Performed by helper: Fasten right, Fasten left, Don/doff right shoe, Don/doff left  shoe       TED Hose - Performed by helper: Don/doff right TED hose, Don/doff left TED hose  Lower body assist Assist for lower body dressing: Touching or steadying assistance (Pt > 75%)      Toileting Toileting   Toileting steps completed by patient: Adjust clothing prior to toileting, Performs perineal hygiene, Adjust clothing after toileting Toileting steps completed by helper: Adjust clothing prior to toileting Toileting Assistive Devices: Grab bar or rail  Toileting assist Assist level: Supervision or verbal cues   Transfers Chair/bed transfer   Chair/bed transfer method: Stand pivot Chair/bed transfer assist level: No Help, no cues, assistive device, takes more than a reasonable amount of time Chair/bed transfer assistive device: Armrests     Locomotion Ambulation     Max distance: 300 Assist level: Supervision or verbal cues   Wheelchair          Cognition Comprehension Comprehension assist level: Understands basic 90% of the time/cues < 10% of the time  Expression Expression assist level: Expresses basic 75 - 89% of the time/requires cueing 10 - 24% of the time. Needs helper to occlude trach/needs to repeat words.  Social Interaction Social Interaction assist level: Interacts appropriately 25 - 49% of time - Needs frequent redirection.  Problem Solving Problem solving assist level: Solves basic 50 - 74% of the time/requires cueing 25 - 49% of the time  Memory Memory assist level: Recognizes or recalls 50 - 74% of the time/requires cueing 25 - 49% of the time  Medical Problem List and Plan: 1.  Paraparesis secondary to lumbar spinal stenosis and spondylolisthesis status post L2-3, L3-4 lumbar decompression and fusion 12/16/2068  -to SNF today  -follow up with me PRN 2.  DVT Prophylaxis/Anticoagulation: Pharmaceutical: Lovenox 3. Pain Management: Previous mental status changes likely related to escalated narcotics, off Oxycontin BID. Dc scheduled oxy IR as  well  -continue with celebrex bid and prn oxycodone for breakthrough pain.     -On gabapentin for neuropathy, increased on 8/27  -continue kpad--for back/trunk pain  -Lidoderm patches   4. Mood: Team to provide ego support. LCSW to follow for evaluation and support.     5. Neuropsych: This patient is capable of making decisions on her own behalf.  -some improvement with decreased narcs 6. Skin/Wound Care: Monitor back wound daily for healing 7. Fluids/Electrolytes/Nutrition:   -Cr back to baseline  -continue to encourage fluids 8. HTN: Monitor BP bid. holding lasix due to hypotension. Continue Cardizem with parameters.   10 COPD/Asthma:  Encourage IS with fluter valve. Respiratory status stable on Dulera, Spiriva and Singulair.  11. CKD:   Avoid nephrotoxic medications  12. Constipation: responsive to meds 13. Post-op anemia:   -hgb 8.7 on 8/25 14. Sleep disturbance   Trazodone scheduled, pt refuses at times   n 25 minutes spent with pt with greater than 20 minutes in counseling.   LOS (Days) 12 A FACE TO FACE EVALUATION WAS PERFORMED  Missie Gehrig T 01/14/2016 1:43 PM

## 2016-01-14 NOTE — Progress Notes (Signed)
Speech Language Pathology Discharge Summary  Patient Details  Name: QUANIYA DAMAS MRN: [id number] Date of Birth: 1946-04-30   Patient has met 5 of 5 long term goals.  Patient to discharge at overall Mod level.   Reasons goals not met: N/A   Clinical Impression/Discharge Summary: Patient has made functional but inconsistent gains and has met 5 of 5 LTG's this admission due to improved cognitive function. Currently, patient requires overall Min-Mod A cues to complete functional and familiar tasks safely in regards to sustained attention, problem solving, recall and intellectual awareness. Patient will require 24 hour supervision at discharge and her family is unable to provide the necessary assistance needed at this time, therefore, patient will discharge to a SNF. Patient would benefit from f/u SLP services to maximize cognitive function and overall functional independence to reduce caregiver burden.   Care Partner:  Caregiver Able to Provide Assistance: No  Type of Caregiver Assistance: Physical;Cognitive  Recommendation:  24 hour supervision/assistance;Skilled Nursing facility  Rationale for SLP Follow Up: Maximize cognitive function and independence;Reduce caregiver burden   Equipment: N/A   Reasons for discharge: Discharged from hospital;Treatment goals met   Patient/Family Agrees with Progress Made and Goals Achieved: Yes     Khala Tarte 01/14/2016, 10:24 AM

## 2016-01-14 NOTE — Progress Notes (Signed)
Physical Therapy Discharge Summary  Patient Details  Name: [name] MRN: [id number] Date of Birth: September 15, 1945  Today's Date: 01/14/2016 PT Individual Time: 0800-0830 and 1115-1130 PT Individual Time Calculation (min): 30 min and 15 min (total 45 min)     Patient has met 9 of 10 long term goals due to improved activity tolerance, improved balance, improved postural control, increased strength, ability to compensate for deficits, functional use of  right lower extremity and left lower extremity, improved attention, improved awareness and improved coordination.  Patient to discharge at an ambulatory level [place].   Patient does not have care partner to provide the necessary physical and cognitive assistance at discharge. Plan to d/c to SNF for continued rehab and progress towards long-term modI goals before returning home.  Reasons goals not met: Recommend S for dynamic standing balance d/t poor recall of back precautions, impulsivity.   Recommendation:  Patient will benefit from ongoing skilled PT services in skilled nursing facility setting to continue to advance safe functional mobility, address ongoing impairments in strength, coordination, activity tolerance, safety awareness, and minimize fall risk.  Equipment: No equipment provided  Reasons for discharge: treatment goals met and discharge from hospital  Patient/family agrees with progress made and goals achieved: Yes  PT Discharge Precautions/RestrictionsPrecautions Precautions: Back;Fall Precaution Comments: Pt unable to consistently recall back precautions and requires max cuing to maintain during functional tasks.  Required Braces or Orthoses: Spinal Brace Spinal Brace: Thoracolumbosacral orthotic;Applied in sitting position Restrictions Weight Bearing Restrictions: No Pain Pain Assessment Pain Assessment: 0-10 Pain Score: 10-Worst pain ever Pain Type: Acute pain;Surgical pain Pain Location: Back Pain  Orientation: Medial Pain Descriptors / Indicators: Aching Pain Onset: On-going Pain Intervention(s): RN made aware;Repositioned Multiple Pain Sites: No Vision/Perception    WFL Cognition Overall Cognitive Status: Impaired/Different from baseline Arousal/Alertness: Awake/alert Orientation Level: Oriented to person;Oriented to place;Oriented to situation;Disoriented to time Attention: Sustained Sustained Attention: Impaired Memory: Impaired Memory Impairment: Decreased recall of new information;Retrieval deficit;Storage deficit Awareness: Impaired Awareness Impairment: Intellectual impairment Problem Solving: Impaired Problem Solving Impairment: Verbal basic;Functional basic Executive Function:  (all impaired) Behaviors: Impulsive Safety/Judgment: Impaired Comments: Pt with poor recall of back precautions, able to recall 1/3 precautions Sensation Sensation Light Touch: Appears Intact Proprioception: Appears Intact Coordination Gross Motor Movements are Fluid and Coordinated: Yes Fine Motor Movements are Fluid and Coordinated: Yes Motor  Motor Motor: Within Functional Limits  Mobility Bed Mobility Bed Mobility: Supine to Sit;Sit to Supine Supine to Sit: 6: Modified independent (Device/Increase time) Sit to Supine: 6: Modified independent (Device/Increase time) Transfers Transfers: Yes Stand Pivot Transfers: 6: Modified independent (Device/Increase time);With armrests Locomotion  Ambulation Ambulation: Yes Ambulation/Gait Assistance: 5: Supervision;4: Min guard Ambulation Distance (Feet): 300 Feet Assistive device: None Ambulation/Gait Assistance Details: Verbal cues for precautions/safety Gait Gait: Yes Gait Pattern: Impaired Gait Pattern: Shuffle;Poor foot clearance - right;Poor foot clearance - left;Wide base of support;Decreased stride length;Left foot flat;Right flexed knee in stance;Left flexed knee in stance;Right foot flat Gait velocity: decreased for  age/gender norms Stairs / Additional Locomotion Stairs: Yes Stairs Assistance: 5: Supervision Stairs Assistance Details: Verbal cues for technique;Verbal cues for precautions/safety;Verbal cues for sequencing Stair Management Technique: Two rails;Alternating pattern Number of Stairs: 12 Height of Stairs: 6 Ramp: 4: Min assist Curb: 4: Min Administrator Mobility: No  Trunk/Postural Assessment  Cervical Assessment Cervical Assessment: Within Functional Limits Thoracic Assessment Thoracic Assessment: Exceptions to [facility] (back precautions) Lumbar Assessment Lumbar Assessment: Exceptions to [facility] (back precautions) Postural Control Postural Control: Within  Functional Limits  Balance Balance Balance Assessed: Yes Standardized Balance Assessment Standardized Balance Assessment: Berg Balance Test Berg Balance Test Sit to Stand: Able to stand without using hands and stabilize independently Standing Unsupported: Able to stand safely 2 minutes Sitting with Back Unsupported but Feet Supported on Floor or Stool: Able to sit safely and securely 2 minutes Stand to Sit: Sits safely with minimal use of hands Transfers: Able to transfer safely, minor use of hands Standing Unsupported with Eyes Closed: Able to stand 10 seconds safely Standing Ubsupported with Feet Together: Able to place feet together independently and stand for 1 minute with supervision From Standing, Reach Forward with Outstretched Arm: Can reach forward >12 cm safely (5") From Standing Position, Pick up Object from Floor: Unable to try/needs assist to keep balance From Standing Position, Turn to Look Behind Over each Shoulder: Looks behind one side only/other side shows less weight shift Turn 360 Degrees: Able to turn 360 degrees safely in 4 seconds or less Standing Unsupported, Alternately Place Feet on Step/Stool: Able to stand independently and complete 8 steps >20 seconds Standing Unsupported, One Foot  in Front: Able to plae foot ahead of the other independently and hold 30 seconds Standing on One Leg: Tries to lift leg/unable to hold 3 seconds but remains standing independently Total Score: 44 Static Sitting Balance Static Sitting - Balance Support: Right upper extremity supported;Left upper extremity supported;Feet supported Static Sitting - Level of Assistance: 6: Modified independent (Device/Increase time) Dynamic Sitting Balance Dynamic Sitting - Balance Support: Feet supported;During functional activity Dynamic Sitting - Level of Assistance: 6: Modified independent (Device/Increase time) Static Standing Balance Static Standing - Balance Support: During functional activity;No upper extremity supported Static Standing - Level of Assistance: 5: Stand by assistance Static Stance: Eyes Closed: S x30 sec Dynamic Standing Balance Dynamic Standing - Balance Support: During functional activity;Right upper extremity supported;Left upper extremity supported Dynamic Standing - Level of Assistance: 5: Stand by assistance Dynamic Standing - Balance Activities: Reaching for objects;Reaching across midline;Forward lean/weight shifting Extremity Assessment  RUE Assessment RUE Assessment: Within Functional Limits LUE Assessment LUE Assessment: Within Functional Limits RLE Assessment RLE Assessment: Exceptions to [facility] (grossly 4/5 throughout) LLE Assessment LLE Assessment: Exceptions to [facility] (grossly 4+/5 throughout)  Skilled Therapeutic Intervention: Tx 1:Pt received supine in bed, c/o 10/10 pain and agreeable to treatment with encouragement and request for medication; RN alerted and present shortly after to administer medications. Supine>sit mod I. TLSO donned EOB with maxA. Stand pivot transfer >w/c with mod I. Once seated in w/c, RN and respiratory therapist present attempting to administer medications and pt became slightly verbally agitated; therapist stepped out of room until other staff  completed. When therapist returned pt agitated, frustrated by several staff members coming in her room "when I'm trying to wake up and I'm in pain and I just want to drink my coffee". Setup pt with coffee, reassured pt that she was not being rushed, however pt continues to be agitated and raising voice at therapist, saying she "just wanted to be left alone". Remainder of discharge assessment deferred at this time; plan to reassess as schedule and pt willingness allows.   Tx 2:At therapist's return pt agreeable to treatment, continued c/o pain as above. Assessed gait, stairs, car transfer, gait on ramp and uneven surface with S overall. Pt easily distracted and requires cueing to redirect to task. Pt with no further questions/concerns regarding d/c at this time. Returned to room with gait S, remained seated on EOB with alarm  intact and all needs in reach at end of session.   See Function Navigator for Current Functional Status.  Benjiman Core Tygielski 01/14/2016, 8:44 AM

## 2016-01-14 NOTE — Progress Notes (Signed)
Social Work  Discharge Note  The overall goal for the admission was met for:   Discharge location: No - d/c plan changed to SNF as pt requiring 24/7 assist which family is unable to provide  Length of Stay: Yes - 11 days  Discharge activity level: Yes - supervision/ min assist  Home/community participation: Yes  Services provided included: MD, RD, PT, OT, SLP, RN, TR, Pharmacy and SW  Financial Services: Medicare and Medicaid  Follow-up services arranged: Other: [facility]  Comments (or additional information):  Patient/Family verbalized understanding of follow-up arrangements: Yes  Individual responsible for coordination of the follow-up plan: pt  Confirmed correct DME delivered: NA  [name]

## 2016-01-14 NOTE — Progress Notes (Signed)
[facility]. Ready to be transfer to facility.

## 2016-01-15 ENCOUNTER — Non-Acute Institutional Stay (SKILLED_NURSING_FACILITY): Payer: Medicare Other | Admitting: Internal Medicine

## 2016-01-15 ENCOUNTER — Encounter: Payer: Self-pay | Admitting: Internal Medicine

## 2016-01-15 ENCOUNTER — Inpatient Hospital Stay: Payer: Medicare Other | Admitting: Adult Health

## 2016-01-15 DIAGNOSIS — D62 Acute posthemorrhagic anemia: Secondary | ICD-10-CM

## 2016-01-15 DIAGNOSIS — E785 Hyperlipidemia, unspecified: Secondary | ICD-10-CM

## 2016-01-15 DIAGNOSIS — N179 Acute kidney failure, unspecified: Secondary | ICD-10-CM | POA: Diagnosis not present

## 2016-01-15 DIAGNOSIS — G4700 Insomnia, unspecified: Secondary | ICD-10-CM

## 2016-01-15 DIAGNOSIS — R531 Weakness: Secondary | ICD-10-CM | POA: Diagnosis not present

## 2016-01-15 DIAGNOSIS — K5900 Constipation, unspecified: Secondary | ICD-10-CM | POA: Diagnosis not present

## 2016-01-15 DIAGNOSIS — N189 Chronic kidney disease, unspecified: Secondary | ICD-10-CM

## 2016-01-15 DIAGNOSIS — M792 Neuralgia and neuritis, unspecified: Secondary | ICD-10-CM | POA: Diagnosis not present

## 2016-01-15 DIAGNOSIS — D72829 Elevated white blood cell count, unspecified: Secondary | ICD-10-CM | POA: Diagnosis not present

## 2016-01-15 DIAGNOSIS — E46 Unspecified protein-calorie malnutrition: Secondary | ICD-10-CM | POA: Diagnosis not present

## 2016-01-15 DIAGNOSIS — K219 Gastro-esophageal reflux disease without esophagitis: Secondary | ICD-10-CM

## 2016-01-15 DIAGNOSIS — M4727 Other spondylosis with radiculopathy, lumbosacral region: Secondary | ICD-10-CM

## 2016-01-15 DIAGNOSIS — J449 Chronic obstructive pulmonary disease, unspecified: Secondary | ICD-10-CM | POA: Diagnosis not present

## 2016-01-15 DIAGNOSIS — R2681 Unsteadiness on feet: Secondary | ICD-10-CM

## 2016-01-15 NOTE — Progress Notes (Signed)
LOCATION: [facility]  PCP: Rinaldo Cloud, MD   Code Status: Full Code  Goals of care: Advanced Directive information Advanced Directives 12/31/2015  Does patient have an advance directive? No  Would patient like information on creating an advanced directive? -       Extended Emergency Contact Information Primary Emergency Contact: Burton,[name] Address: [address]          [place] Home Phone: [phone] Relation: Daughter   Allergies  Allergen Reactions  . Shrimp [Shellfish Allergy] Hives and Swelling    Chief Complaint  Patient presents with  . New Admit To SNF    New Admission     HPI:  Patient is a 70 y.o. female seen today for short term rehabilitation post hospital and inpatient rehabilitation admission from 12/30/15-01/14/16 with lumbosacral spondylosis and spondylolisthesis with gait imbalance and radiculopathy. She underwent lumbar decompression and fusion L2-L5 on 12/30/15. Post surgery, she had acute hypercarbic respiratory failure thought to be from her COPD and pain medication. She also had some hypotensive episode and required medication adjustment. She had acute blood loss anemia. She is seen in her room today.   Review of Systems:  Constitutional: Negative for fever, chills, diaphoresis. Energy level is slowly coming back.  HENT: Negative for headache, congestion, nasal discharge, difficulty swallowing.   Eyes: Negative for blurred vision, double vision and discharge.  Respiratory: Negative for cough, shortness of breath and wheezing.   Cardiovascular: Negative for chest pain, palpitations, leg swelling.  Gastrointestinal: Negative for heartburn, nausea, vomiting, abdominal pain. Last bowel movement was yesterday.  Genitourinary: Negative for dysuria and flank pain.  Musculoskeletal: Negative for fall in the facility. Positive for back pain. Pian medication has been helpful.  Skin: Negative for itching, rash.    Neurological: Negative for dizziness. Psychiatric/Behavioral: Negative for depression.   Past Medical History:  Diagnosis Date  . ALLERGIC RHINITIS   . Arthritis   . ASTHMA, UNSPECIFIED, UNSPECIFIED STATUS   . DYSLIPIDEMIA   . Headache(784.0)   . HYPERTENSION   . Hypertension   . Mild renal insufficiency   . PNA (pneumonia)   . Shortness of breath dyspnea   . SPINAL STENOSIS, LUMBAR    MRI 12/2005; surg 2004  . TOBACCO ABUSE    Past Surgical History:  Procedure Laterality Date  . ANTERIOR LAT LUMBAR FUSION Right 12/30/2015   Procedure: Lumbar two-three, Lumbar three-four Anterior Lateral Lumbar Interbody Fusion with Anterior column realignment at Lumbar three-four;  Surgeon: [name], MD;  Location: [facility];  Service: Neurosurgery;  Laterality: Right;  . Closure of bronchopleural fistula, right lower lobe.    [name] COLONOSCOPY    . Decompressive Laminectomy     Fusion L4-5, L5-S1 ([facility]-2004)  . Decortication of right lower lobe    . Placement of wound On-Q analgesia irrigation system.    [name] POSTERIOR LUMBAR FUSION 4 LEVEL N/A 12/30/2015   Procedure: Lumbar two -Sacral one Dorsal Internal Fixation and Fusion, [name] osteotomies Lumbar two-three, Lumbar three-four;  Surgeon: [name], MD;  Location: [facility];  Service: Neurosurgery;  Laterality: N/A;  . Right hand  1995  . Right knee  1990  . Right video-assisted thoracoscopic surgery, drainage of empyema.  02/12/2011   Social History:   reports that she has been smoking Cigarettes.  She has a 10.00 pack-year smoking history. She has never used smokeless tobacco. She reports that she does not drink alcohol or use drugs.  Family History  Problem Relation Age of Onset  . Mental illness Mother   . Hypertension Mother   . Arthritis Other   . Hypertension Other     Medications:   Medication List       Accurate as of 01/15/16 10:31 AM. Always use your most recent med list.           acetaminophen 325 MG tablet Commonly known as:  TYLENOL Take 650 mg by mouth every 4 (four) hours as needed for mild pain.   ADVAIR DISKUS 250-50 MCG/DOSE Aepb Generic drug:  Fluticasone-Salmeterol Inhale 1 puff into the lungs 2 (two) times daily.   albuterol 108 (90 Base) MCG/ACT inhaler Commonly known as:  PROVENTIL HFA;VENTOLIN HFA Inhale 2 puffs into the lungs every 6 (six) hours as needed for wheezing or shortness of breath.   celecoxib 200 MG capsule Commonly known as:  CELEBREX Take 1 capsule (200 mg total) by mouth daily.   cholecalciferol 1000 units tablet Commonly known as:  VITAMIN D Take 1,000 Units by mouth daily.   diltiazem 240 MG 24 hr capsule Commonly known as:  CARDIZEM CD Take 240 mg by mouth daily.   gabapentin 300 MG capsule Commonly known as:  NEURONTIN Take 2 capsules (600 mg total) by mouth 3 (three) times daily.   lidocaine 5 % Commonly known as:  LIDODERM Place 2 patches onto the skin daily. Remove & Discard patch within 12 hours or as directed by MD   losartan 100 MG tablet Commonly known as:  COZAAR Take 100 mg by mouth daily.   methocarbamol 750 MG tablet Commonly known as:  ROBAXIN Take 1 tablet (750 mg total) by mouth 4 (four) times daily.   montelukast 10 MG tablet Commonly known as:  SINGULAIR Take 10 mg by mouth at bedtime.   oxyCODONE 5 MG immediate release tablet Commonly known as:  Oxy IR/ROXICODONE Take 1-2 tablets (5-10 mg total) by mouth every 6 (six) hours as needed for severe pain.   pantoprazole 20 MG tablet Commonly known as:  PROTONIX Take 1 tablet (20 mg total) by mouth daily.   polyethylene glycol packet Commonly known as:  MIRALAX / GLYCOLAX Take 17 g by mouth 2 (two) times daily.   rosuvastatin 10 MG tablet Commonly known as:  CRESTOR Take 10 mg by mouth daily.   tiotropium 18 MCG inhalation capsule Commonly known as:  SPIRIVA Place 18 mcg into inhaler and inhale daily.   traZODone 50 MG  tablet Commonly known as:  DESYREL Take 1 tablet (50 mg total) by mouth at bedtime.       Immunizations: Immunization History  Administered Date(s) Administered  . Influenza Whole 02/20/2009, 01/24/2010  . Td 02/20/2009     Physical Exam:  Vitals:   01/15/16 1026  BP: 140/66  Pulse: 84  Resp: 20  Temp: 99.4 F (37.4 C)  TempSrc: Oral  SpO2: 99%  Weight: 152 lb (68.9 kg)  Height: 5\' 5"  (1.651 m)   Body mass index is 25.29 kg/m.  General- elderly female, well built, in no acute distress Head- normocephalic, atraumatic Nose- no maxillary or frontal sinus tenderness, no nasal discharge Throat- moist mucus membrane, poor dentition Eyes- PERRLA, EOMI, no pallor, no icterus, no discharge, normal conjunctiva, normal sclera Neck- no cervical lymphadenopathy Cardiovascular- normal s1,s2, no murmur, trace leg edema Respiratory- bilateral clear to auscultation, no wheeze, no rhonchi, no crackles, no use of accessory muscles Abdomen- bowel sounds present, soft, non tender Musculoskeletal- able to move all  4 extremities, generalized weakness, back brace present Neurological- alert and oriented to place only Skin- warm and dry, surgical incision to back with dressing in place Psychiatry- normal mood and affect    Labs reviewed: Basic Metabolic Panel:  Recent Labs  16/10/96 0544 01/04/16 0550 01/10/16 0627  NA 146* 146* 138  K 3.9 4.6 4.4  CL 114* 114* 109  CO2 24 24 21*  GLUCOSE 96 109* 73  BUN 31* 25* 8  CREATININE 1.52* 1.27* 1.07*  CALCIUM 10.5* 10.5* 9.5   Liver Function Tests:  Recent Labs  01/03/16 0544  AST 41  ALT 16  ALKPHOS 39  BILITOT 0.5  PROT 5.9*  ALBUMIN 2.9*   No results for input(s): LIPASE, AMYLASE in the last 8760 hours. No results for input(s): AMMONIA in the last 8760 hours. CBC:  Recent Labs  12/30/15 2336 01/03/16 0544 01/10/16 0627  WBC 13.5* 10.9* 12.4*  NEUTROABS  --  7.6  --   HGB 8.9* 8.4* 8.7*  HCT 27.5* 26.9*  28.7*  MCV 94.8 98.9 100.0  PLT 124* 168 395    Radiological Exams: Dg Lumbar Spine 2-3 Views  Result Date: 12/30/2015 CLINICAL DATA:  L2 through L4 fusion. EXAM: DG C-ARM 61-120 MIN; LUMBAR SPINE - 2-3 VIEW COMPARISON:  Lumbar spine MR dated 11/21/2015. FINDINGS: Previously, the last open disc space was labeled the L5-S1 level. The current images are labeled accordingly. Pedicle screw and rod fixation at the L2 through L5 levels with interbody fusion at the L2-3 and L3-4 levels. There are also horizontal fixation screws at the L3 and L4 levels. Grade 1 anterolisthesis at the L3-4 level without significant change. S1 pedicle screws are noted. IMPRESSION: Operative changes, as described above. Electronically Signed   By: Beckie Salts M.D.   On: 12/30/2015 15:10   Dg C-arm 61-120 Min  Result Date: 12/30/2015 CLINICAL DATA:  L2 through L4 fusion. EXAM: DG C-ARM 61-120 MIN; LUMBAR SPINE - 2-3 VIEW COMPARISON:  Lumbar spine MR dated 11/21/2015. FINDINGS: Previously, the last open disc space was labeled the L5-S1 level. The current images are labeled accordingly. Pedicle screw and rod fixation at the L2 through L5 levels with interbody fusion at the L2-3 and L3-4 levels. There are also horizontal fixation screws at the L3 and L4 levels. Grade 1 anterolisthesis at the L3-4 level without significant change. S1 pedicle screws are noted. IMPRESSION: Operative changes, as described above. Electronically Signed   By: Beckie Salts M.D.   On: 12/30/2015 15:10    Assessment/Plan  Generalized weakness Will have patient work with PT/OT as tolerated to regain strength and restore function.  Fall precautions are in place.  Unsteady gait With lumbar spondylosis, now s/p surgical repair. Will need her to work with therapy team and take fall precautions  Lumbar spondylosis with radiculopathy S/p lumbar decompression and fusion. Will need neurosurgery follow up. Currently on celecoxib 200 mg daily, lidocaine 5%  patch daily with tylenol 650 mg q4h prn and oxycodone IR 5 mg 1-2 tab q6h prn pain. Continue robaxin 750 mg qid for muscle spasm. Pt to wear her back brace. Will have her work with physical therapy and occupational therapy team to help with gait training and muscle strengthening exercises.fall precautions. Skin care. Encourage to be out of bed.   Blood loss anemia Post op, monitor cbc  Leukocytosis Afebrile, monitor wbc curve  Acute encephalopathy She is alert and oriented only to place at present. No chronic diagnosis of memory issue noted. Monitor clinically.  SLP to evaluate for cognition and risk for aspiration with her poor dentition and recent respiratory failure.  Protein calorie malnutrition RD to evaluate, monitor po intake and weight.   Acute on chronic renal failure Monitor bmp  COPD With recent hypercarbic respiratory failure. Breathing stable at present. Continue advair and spiriva and prn albuterol. Continue daily singulair.   HTN Monitor BP bid. Continue diltiazem 240 mg daily with losartan 100 mg daily. Check bmp  Neuropathic pain Continue gabapentin 600 mg tid  gerd Stable on pantoprazole 20 mg daily, monitor  HLD Continue crestor  Constipation Continue miralax bid  Insomnia Continue her trazodone   Goals of care: short term rehabilitation   Labs/tests ordered: cbc, cmp 01/16/16  Family/ staff Communication: reviewed care plan with patient and nursing supervisor    Oneal Grout, MD Internal Medicine [facility] [address] [place], [place] [address] Cell Phone (Monday-Friday 8 am - 5 pm): [phone] On Call: [phone] and follow prompts after 5 pm and on weekends Office Phone: [phone] Office Fax: [phone]

## 2016-01-16 ENCOUNTER — Other Ambulatory Visit: Payer: Self-pay

## 2016-01-16 MED ORDER — OXYCODONE HCL 5 MG PO TABS: 5.0000 mg | ORAL_TABLET | ORAL | 0 refills | Status: DC | PRN

## 2016-01-16 NOTE — Telephone Encounter (Signed)
Rx faxed to [facility] @ [phone], phone number [phone]  

## 2016-01-19 ENCOUNTER — Emergency Department (HOSPITAL_COMMUNITY)
Admission: EM | Admit: 2016-01-19 | Discharge: 2016-01-19 | Disposition: A | Discharge status: Home / Self Care | Payer: Medicare Other | Attending: Emergency Medicine | Admitting: Emergency Medicine

## 2016-01-19 ENCOUNTER — Encounter (HOSPITAL_COMMUNITY): Payer: Self-pay | Admitting: Emergency Medicine

## 2016-01-19 ENCOUNTER — Emergency Department (HOSPITAL_COMMUNITY): Payer: Medicare Other

## 2016-01-19 DIAGNOSIS — Z7951 Long term (current) use of inhaled steroids: Secondary | ICD-10-CM | POA: Insufficient documentation

## 2016-01-19 DIAGNOSIS — M5440 Lumbago with sciatica, unspecified side: Secondary | ICD-10-CM | POA: Insufficient documentation

## 2016-01-19 DIAGNOSIS — G8918 Other acute postprocedural pain: Secondary | ICD-10-CM | POA: Diagnosis not present

## 2016-01-19 DIAGNOSIS — J449 Chronic obstructive pulmonary disease, unspecified: Secondary | ICD-10-CM | POA: Insufficient documentation

## 2016-01-19 DIAGNOSIS — M545 Low back pain: Secondary | ICD-10-CM | POA: Diagnosis present

## 2016-01-19 DIAGNOSIS — Z79899 Other long term (current) drug therapy: Secondary | ICD-10-CM | POA: Diagnosis not present

## 2016-01-19 DIAGNOSIS — F17210 Nicotine dependence, cigarettes, uncomplicated: Secondary | ICD-10-CM | POA: Insufficient documentation

## 2016-01-19 DIAGNOSIS — J45909 Unspecified asthma, uncomplicated: Secondary | ICD-10-CM | POA: Diagnosis not present

## 2016-01-19 DIAGNOSIS — I129 Hypertensive chronic kidney disease with stage 1 through stage 4 chronic kidney disease, or unspecified chronic kidney disease: Secondary | ICD-10-CM | POA: Insufficient documentation

## 2016-01-19 DIAGNOSIS — N189 Chronic kidney disease, unspecified: Secondary | ICD-10-CM | POA: Diagnosis not present

## 2016-01-19 DIAGNOSIS — Z981 Arthrodesis status: Secondary | ICD-10-CM

## 2016-01-19 MED ORDER — OXYCODONE HCL 5 MG PO TABS
5.0000 mg | ORAL_TABLET | Freq: Once | ORAL | Status: AC
  Administered 2016-01-19: 5 mg via ORAL
  Filled 2016-01-19: qty 1

## 2016-01-19 MED ORDER — OXYCODONE HCL 5 MG PO TABS
10.0000 mg | ORAL_TABLET | Freq: Once | ORAL | Status: AC
  Administered 2016-01-19: 10 mg via ORAL
  Filled 2016-01-19: qty 2

## 2016-01-19 MED ORDER — PERCOCET 5-325 MG PO TABS: 1.0000 | ORAL_TABLET | Freq: Four times a day (QID) | ORAL | 0 refills | Status: DC | PRN

## 2016-01-19 NOTE — Discharge Instructions (Signed)
Follow-up with her primary care doctor.  Return here as needed.  The patient will need to take her pain medications every 4-6 hours as needed for pain
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
## 2016-01-19 NOTE — ED Notes (Signed)
Bed: WA06 Expected date:  Expected time:  Means of arrival:  Comments: 70 yo F  Back pain

## 2016-01-19 NOTE — ED Provider Notes (Signed)
Case management advised that the patient and the family will need to speak with her primary care doctor about a switch in facilities.  I will send the patient with instruction to take her pain medication every 4-6 hours   Charlestine NightChristopher Ajahnae Rathgeber, PA-C 01/19/16 0949    Charlestine Nighthristopher Kymari Nuon, PA-C 01/19/16 [phone]    Lorre NickAnthony Allen, MD 01/20/16 [phone]

## 2016-01-19 NOTE — ED Notes (Signed)
Patient was educated not to drive, operate heavy machinery, or drink alcohol while taking narcotic medication.  

## 2016-01-26 ENCOUNTER — Encounter (HOSPITAL_COMMUNITY): Payer: Self-pay

## 2016-01-26 ENCOUNTER — Emergency Department (HOSPITAL_COMMUNITY)
Admission: EM | Admit: 2016-01-26 | Discharge: 2016-01-26 | Disposition: A | Discharge status: Home / Self Care | Payer: Medicare Other | Attending: Emergency Medicine | Admitting: Emergency Medicine

## 2016-01-26 DIAGNOSIS — I129 Hypertensive chronic kidney disease with stage 1 through stage 4 chronic kidney disease, or unspecified chronic kidney disease: Secondary | ICD-10-CM | POA: Diagnosis not present

## 2016-01-26 DIAGNOSIS — F419 Anxiety disorder, unspecified: Secondary | ICD-10-CM | POA: Diagnosis not present

## 2016-01-26 DIAGNOSIS — F418 Other specified anxiety disorders: Secondary | ICD-10-CM

## 2016-01-26 DIAGNOSIS — F17210 Nicotine dependence, cigarettes, uncomplicated: Secondary | ICD-10-CM | POA: Insufficient documentation

## 2016-01-26 DIAGNOSIS — G8918 Other acute postprocedural pain: Secondary | ICD-10-CM | POA: Insufficient documentation

## 2016-01-26 DIAGNOSIS — J45909 Unspecified asthma, uncomplicated: Secondary | ICD-10-CM | POA: Diagnosis not present

## 2016-01-26 DIAGNOSIS — Z79899 Other long term (current) drug therapy: Secondary | ICD-10-CM | POA: Insufficient documentation

## 2016-01-26 DIAGNOSIS — J449 Chronic obstructive pulmonary disease, unspecified: Secondary | ICD-10-CM | POA: Insufficient documentation

## 2016-01-26 DIAGNOSIS — N189 Chronic kidney disease, unspecified: Secondary | ICD-10-CM | POA: Insufficient documentation

## 2016-01-26 DIAGNOSIS — R4589 Other symptoms and signs involving emotional state: Secondary | ICD-10-CM

## 2016-01-26 DIAGNOSIS — R531 Weakness: Secondary | ICD-10-CM | POA: Diagnosis present

## 2016-01-26 LAB — BASIC METABOLIC PANEL
Anion gap: 7 (ref 5–15)
BUN: 16 mg/dL (ref 6–20)
CHLORIDE: 110 mmol/L (ref 101–111)
CO2: 22 mmol/L (ref 22–32)
Calcium: 10.6 mg/dL — ABNORMAL HIGH (ref 8.9–10.3)
Creatinine, Ser: 1.58 mg/dL — ABNORMAL HIGH (ref 0.44–1.00)
GFR calc non Af Amer: 33 mL/min — ABNORMAL LOW (ref >60)
GFR, EST AFRICAN AMERICAN: 38 mL/min — AB (ref >60)
GLUCOSE: 104 mg/dL — AB (ref 65–99)
POTASSIUM: 3.7 mmol/L (ref 3.5–5.1)
SODIUM: 139 mmol/L (ref 135–145)

## 2016-01-26 LAB — CBC
HEMATOCRIT: 32.5 % — AB (ref 36.0–46.0)
HEMOGLOBIN: 10.8 g/dL — AB (ref 12.0–15.0)
MCH: 31.5 pg (ref 26.0–34.0)
MCHC: 33.2 g/dL (ref 30.0–36.0)
MCV: 94.8 fL (ref 78.0–100.0)
Platelets: 279 10*3/uL (ref 150–400)
RBC: 3.43 MIL/uL — ABNORMAL LOW (ref 3.87–5.11)
RDW: 14.1 % (ref 11.5–15.5)
WBC: 9 10*3/uL (ref 4.0–10.5)

## 2016-01-26 LAB — URINALYSIS, ROUTINE W REFLEX MICROSCOPIC
BILIRUBIN URINE: NEGATIVE
GLUCOSE, UA: NEGATIVE mg/dL
Hgb urine dipstick: NEGATIVE
KETONES UR: NEGATIVE mg/dL
LEUKOCYTES UA: NEGATIVE
Nitrite: NEGATIVE
PH: 5 (ref 5.0–8.0)
Protein, ur: NEGATIVE mg/dL
SPECIFIC GRAVITY, URINE: 1.022 (ref 1.005–1.030)

## 2016-01-26 LAB — CBG MONITORING, ED: Glucose-Capillary: 98 mg/dL (ref 65–99)

## 2016-01-26 MED ORDER — SODIUM CHLORIDE 0.9 % IV BOLUS (SEPSIS)
1000.0000 mL | Freq: Once | INTRAVENOUS | Status: AC
  Administered 2016-01-26: 1000 mL via INTRAVENOUS

## 2016-01-26 MED ORDER — HYDROCODONE-ACETAMINOPHEN 7.5-325 MG PO TABS
1.0000 | ORAL_TABLET | Freq: Once | ORAL | Status: AC
  Administered 2016-01-26: 1 via ORAL
  Filled 2016-01-26: qty 1
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
## 2016-01-26 NOTE — ED Notes (Signed)
Pt asked for TV to be turned up.
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
## 2016-01-26 NOTE — ED Notes (Signed)
MD at bedside. 

## 2016-01-26 NOTE — ED Provider Notes (Signed)
[facility]-EMERGENCY DEPT Provider Note   CSN: [id number] Arrival date & time: 01/26/16  1514     History   Chief Complaint Chief Complaint  Patient presents with  . Weakness    HPI [name] is a 70 y.o. female.  The history is provided by the patient.  Weakness  Primary symptoms include no focal weakness. Primary symptoms comment: generalizedweaknesssince leaving hospital for [facility]. left [facility] on 9/3 and went home from ED visit. "I got really scaredtoday because I thought something was wrong because I wasn't feeling good". This is a recurrent problem. Episode onset: 2 weeks ago. The problem has been gradually worsening. There was no focality noted. There has been no fever. Pertinent negatives include no shortness of breath, no chest pain and no vomiting. Associated symptoms comments: Loss of appetite, doesn't think she is taking her medicines correctly and lives alone. Daughters went back to college and has no help at home. Home health coming tomorrow..    Past Medical History:  Diagnosis Date  . ALLERGIC RHINITIS   . Arthritis   . ASTHMA, UNSPECIFIED, UNSPECIFIED STATUS   . DYSLIPIDEMIA   . Headache(784.0)   . HYPERTENSION   . Hypertension   . Mild renal insufficiency   . PNA (pneumonia)   . Shortness of breath dyspnea   . SPINAL STENOSIS, LUMBAR    MRI 12/2005; surg 2004  . TOBACCO ABUSE     Patient Active Problem List   Diagnosis Date Noted  . Acute on chronic renal failure (HCC) 01/13/2016  . Neuropathic pain   . Anxiety state   . Sleep disturbance   . Post-operative pain   . Spondylolisthesis of lumbar region 01/02/2016  . Surgery, elective   . Chronic obstructive pulmonary disease (HCC)   . Respiratory failure with hypercapnia (HCC)   . Chronic back pain   . Postoperative anemia due to acute blood loss   . Tachycardia   . Leukocytosis   . Thrombocytopenia (HCC)   . Lumbosacral spondylosis with radiculopathy 12/30/2015  . Acute  encephalopathy   . AKI (acute kidney injury) (HCC)   . CKD (chronic kidney disease)   . HEADACHE 08/16/2009  . DYSLIPIDEMIA 02/20/2009  . TOBACCO ABUSE 02/20/2009  . Benign essential HTN 02/20/2009  . ALLERGIC RHINITIS 02/20/2009  . Asthma 02/20/2009  . SHOULDER PAIN, RIGHT, CHRONIC 02/20/2009  . HIP PAIN, RIGHT, CHRONIC 02/20/2009  . SPINAL STENOSIS, CERVICAL 02/20/2009  . SPINAL STENOSIS, LUMBAR 02/20/2009  . BACK PAIN, LUMBAR 02/20/2009    Past Surgical History:  Procedure Laterality Date  . ANTERIOR LAT LUMBAR FUSION Right 12/30/2015   Procedure: Lumbar two-three, Lumbar three-four Anterior Lateral Lumbar Interbody Fusion with Anterior column realignment at Lumbar three-four;  Surgeon: Loura HaltBenjamin Jared Ditty, MD;  Location: [facility];  Service: Neurosurgery;  Laterality: Right;  . Closure of bronchopleural fistula, right lower lobe.    [name]. COLONOSCOPY    . Decompressive Laminectomy     Fusion L4-5, L5-S1 (C[facility]-2004)  . Decortication of right lower lobe    . Placement of wound On-Q analgesia irrigation system.    [name]. POSTERIOR LUMBAR FUSION 4 LEVEL N/A 12/30/2015   Procedure: Lumbar two -Sacral one Dorsal Internal Fixation and Fusion, [name] osteotomies Lumbar two-three, Lumbar three-four;  Surgeon: Loura HaltBenjamin Jared Ditty, MD;  Location: [facility];  Service: Neurosurgery;  Laterality: N/A;  . Right hand  1995  . Right knee  1990  . Right video-assisted thoracoscopic surgery, drainage of empyema.  02/12/2011  OB History    Gravida Para Term Preterm AB Living   5 4 4   1 4    SAB TAB Ectopic Multiple Live Births     1             Home Medications    Prior to Admission medications   Medication Sig Start Date End Date Taking? Authorizing Provider  acetaminophen (TYLENOL) 325 MG tablet Take 650 mg by mouth every 4 (four) hours as needed for mild pain.    Historical Provider, MD  albuterol (PROVENTIL HFA;VENTOLIN HFA) 108 (90 BASE) MCG/ACT inhaler Inhale 2 puffs into  the lungs every 6 (six) hours as needed for wheezing or shortness of breath.     Historical Provider, MD  celecoxib (CELEBREX) 200 MG capsule Take 1 capsule (200 mg total) by mouth daily. 01/13/16   Jacquelynn Cree, PA-C  cholecalciferol (VITAMIN D) 1000 units tablet Take 1,000 Units by mouth daily.    Historical Provider, MD  diltiazem (CARDIZEM CD) 240 MG 24 hr capsule Take 240 mg by mouth daily.     Historical Provider, MD  Fluticasone-Salmeterol (ADVAIR DISKUS) 250-50 MCG/DOSE AEPB Inhale 1 puff into the lungs 2 (two) times daily.      Historical Provider, MD  gabapentin (NEURONTIN) 300 MG capsule Take 2 capsules (600 mg total) by mouth 3 (three) times daily. 01/13/16   Evlyn Kanner Love, PA-C  lidocaine (LIDODERM) 5 % Place 2 patches onto the skin daily. Remove & Discard patch within 12 hours or as directed by MD 01/13/16   Evlyn Kanner Love, PA-C  losartan (COZAAR) 100 MG tablet Take 100 mg by mouth daily.     Historical Provider, MD  methocarbamol (ROBAXIN) 750 MG tablet Take 1 tablet (750 mg total) by mouth 4 (four) times daily. 01/02/16   [name], MD  montelukast (SINGULAIR) 10 MG tablet Take 10 mg by mouth at bedtime.    Historical Provider, MD  oxyCODONE (OXY IR/ROXICODONE) 5 MG immediate release tablet Take 1-2 tablets (5-10 mg total) by mouth every 4 (four) hours as needed for moderate pain. Note dose 01/16/16   Sharon Seller, NP  pantoprazole (PROTONIX) 20 MG tablet Take 1 tablet (20 mg total) by mouth daily. 01/13/16   Evlyn Kanner Love, PA-C  PERCOCET 5-325 MG tablet Take 1 tablet by mouth every 6 (six) hours as needed for severe pain. 01/19/16   Christopher Lawyer, PA-C  polyethylene glycol (MIRALAX / GLYCOLAX) packet Take 17 g by mouth 2 (two) times daily. Patient not taking: Reported on 01/19/2016 01/13/16   Evlyn Kanner Love, PA-C  rosuvastatin (CRESTOR) 10 MG tablet Take 10 mg by mouth daily.     Historical Provider, MD  sennosides-docusate sodium (SENOKOT-S) 8.6-50 MG tablet Take 2 tablets  by mouth 2 (two) times daily.    Historical Provider, MD  tiotropium (SPIRIVA) 18 MCG inhalation capsule Place 18 mcg into inhaler and inhale daily.      Historical Provider, MD  traZODone (DESYREL) 50 MG tablet Take 1 tablet (50 mg total) by mouth at bedtime. 01/13/16   Jacquelynn Cree, PA-C    Family History Family History  Problem Relation Age of Onset  . Mental illness Mother   . Hypertension Mother   . Arthritis Other   . Hypertension Other     Social History Social History  Substance Use Topics  . Smoking status: Current Every Day Smoker    Packs/day: 0.25    Years: 40.00    Types:  Cigarettes  . 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 Alcohol use No     Allergies   Shrimp [shellfish allergy]   Review of Systems Review of Systems  Respiratory: Negative for shortness of breath.   Cardiovascular: Negative for chest pain.  Gastrointestinal: Negative for vomiting.  Neurological: Positive for weakness. Negative for focal weakness.  All other systems reviewed and are negative.    Physical Exam Updated Vital Signs BP 97/76 (BP Location: Left Arm)   Pulse 120   Temp 98.5 F (36.9 C) (Oral)   Resp 18   Ht 5\' 5"  (1.651 m)   Wt 152 lb (68.9 kg)   SpO2 95%   BMI 25.29 kg/m   Physical Exam  Constitutional: She is oriented to person, place, and time. She appears well-developed and well-nourished. No distress.  HENT:  Head: Normocephalic.  Eyes: Conjunctivae are normal.  Neck: Neck supple. No tracheal deviation present.  Cardiovascular: Normal rate, regular rhythm and normal heart sounds.   Pulmonary/Chest: Effort normal and breath sounds normal. No respiratory distress.  Abdominal: Soft. She exhibits no distension.  Neurological: She is alert and oriented to person, place, and time.  Skin: Skin is warm and dry.  Psychiatric: She has a normal mood and affect. Her behavior is normal. Thought  content normal.  Vitals reviewed.    ED Treatments / Results  Labs (all labs ordered are listed, but only abnormal results are displayed) Labs Reviewed  BASIC METABOLIC PANEL - Abnormal; Notable for the following:       Result Value   Glucose, Bld 104 (*)    Creatinine, Ser 1.58 (*)    Calcium 10.6 (*)    GFR calc non Af Amer 33 (*)    GFR calc Af Amer 38 (*)    All other components within normal limits  CBC - Abnormal; Notable for the following:    RBC 3.43 (*)    Hemoglobin 10.8 (*)    HCT 32.5 (*)    All other components within normal limits  URINALYSIS, ROUTINE W REFLEX MICROSCOPIC (NOT AT [facility])  CBG MONITORING, ED    EKG  EKG Interpretation  Date/Time:  Sunday January 26 2016 15:42:41 EDT Ventricular Rate:  99 PR Interval:    QRS Duration: 78 QT Interval:  351 QTC Calculation: 451 R Axis:   74 Text Interpretation:  Sinus rhythm Abnormal R-wave progression, early transition No significant change since last tracing Interpretation limited secondary to artifact Confirmed by Deena Shaub MD, Amire Gossen ([id number]) on 01/26/2016 3:59:08 PM       Radiology No results found.  Procedures Procedures (including critical care time)  Medications Ordered in ED Medications  sodium chloride 0.9 % bolus 1,000 mL (1,000 mLs Intravenous New Bag/Given 01/26/16 1724)  HYDROcodone-acetaminophen (NORCO) 7.5-325 MG per tablet 1 tablet (1 tablet Oral Given 01/26/16 1711)     Initial Impression / Assessment and Plan / ED Course  I have reviewed the triage vital signs and the nursing notes.  Pertinent labs & imaging results that were available during my care of the patient were reviewed by me and considered in my medical decision making (see chart for details).  Clinical Course    70 year old female presents from home because she became scared that she did not take her pain medications correctly and felt like something was wrong although she is unsure what. She states that her appetite has  been poor  since leaving the nursing facility one week ago which she felt mistreated her. She does not have much family support around at home but does have home health visiting tomorrow. She requested a dosing regimen for her hydrocodone which I was able to draw out for her to help her get through the evening until able to reconcile medications with the home health agency and her prescribing doctor. Provided instructions were within the dosing regimen recommendations on the label of her prescription and included supplemental Tylenol safe level administration if taken as instructed. No significant hematologic or metabolic abnormalities to explain symptoms. Patient mildly tachycardic which improved after reassurance. I believe the patient has anxiety about her health and also might be mildly dehydrated because of poor oral intake since leaving her nursing facility. Has f/u with home health tomorrow. Plan to follow up with PCP as needed and return precautions discussed for worsening or new concerning symptoms.   Final Clinical Impressions(s) / ED Diagnoses   Final diagnoses:  Anxiety about health  Postoperative pain    New Prescriptions New Prescriptions   No medications on file     Lyndal Pulley, MD 01/26/16 1914

## 2016-01-26 NOTE — ED Notes (Signed)
Pt given discharge instructions and informed that she should wait another 2 hours before driving or have someone pick her up.  Pt expressed understanding.  Pt ambulated out of department to the lobby without any reaction to medication noted.

## 2016-02-17 ENCOUNTER — Inpatient Hospital Stay: Payer: Medicare Other | Admitting: Physical Medicine & Rehabilitation

## 2016-02-19 ENCOUNTER — Encounter: Payer: Medicare Other | Admitting: Physical Medicine & Rehabilitation

## 2016-02-26 ENCOUNTER — Encounter: Payer: Medicare Other | Attending: Physical Medicine & Rehabilitation | Admitting: Physical Medicine & Rehabilitation

## 2016-02-26 ENCOUNTER — Telehealth: Payer: Self-pay | Admitting: Physical Medicine & Rehabilitation

## 2016-02-26 ENCOUNTER — Encounter: Payer: Self-pay | Admitting: Physical Medicine & Rehabilitation

## 2016-02-26 VITALS — BP 103/65 | HR 90 | Resp 14

## 2016-02-26 DIAGNOSIS — R51 Headache: Secondary | ICD-10-CM | POA: Diagnosis not present

## 2016-02-26 DIAGNOSIS — R0602 Shortness of breath: Secondary | ICD-10-CM | POA: Insufficient documentation

## 2016-02-26 DIAGNOSIS — M4802 Spinal stenosis, cervical region: Secondary | ICD-10-CM | POA: Diagnosis not present

## 2016-02-26 DIAGNOSIS — N189 Chronic kidney disease, unspecified: Secondary | ICD-10-CM | POA: Diagnosis not present

## 2016-02-26 DIAGNOSIS — E785 Hyperlipidemia, unspecified: Secondary | ICD-10-CM | POA: Diagnosis not present

## 2016-02-26 DIAGNOSIS — M4316 Spondylolisthesis, lumbar region: Secondary | ICD-10-CM | POA: Diagnosis not present

## 2016-02-26 DIAGNOSIS — M48061 Spinal stenosis, lumbar region without neurogenic claudication: Secondary | ICD-10-CM | POA: Diagnosis not present

## 2016-02-26 DIAGNOSIS — I10 Essential (primary) hypertension: Secondary | ICD-10-CM

## 2016-02-26 DIAGNOSIS — D649 Anemia, unspecified: Secondary | ICD-10-CM | POA: Diagnosis not present

## 2016-02-26 DIAGNOSIS — Z9889 Other specified postprocedural states: Secondary | ICD-10-CM | POA: Insufficient documentation

## 2016-02-26 DIAGNOSIS — J449 Chronic obstructive pulmonary disease, unspecified: Secondary | ICD-10-CM | POA: Insufficient documentation

## 2016-02-26 DIAGNOSIS — M4727 Other spondylosis with radiculopathy, lumbosacral region: Secondary | ICD-10-CM | POA: Diagnosis not present

## 2016-02-26 DIAGNOSIS — F17210 Nicotine dependence, cigarettes, uncomplicated: Secondary | ICD-10-CM | POA: Diagnosis not present

## 2016-02-26 DIAGNOSIS — I129 Hypertensive chronic kidney disease with stage 1 through stage 4 chronic kidney disease, or unspecified chronic kidney disease: Secondary | ICD-10-CM | POA: Diagnosis not present

## 2016-02-26 DIAGNOSIS — G8220 Paraplegia, unspecified: Secondary | ICD-10-CM | POA: Insufficient documentation

## 2016-02-26 DIAGNOSIS — K5900 Constipation, unspecified: Secondary | ICD-10-CM | POA: Insufficient documentation

## 2016-02-26 DIAGNOSIS — R42 Dizziness and giddiness: Secondary | ICD-10-CM

## 2016-02-26 MED ORDER — LOSARTAN POTASSIUM 50 MG PO TABS: 50.0000 mg | ORAL_TABLET | Freq: Every day | ORAL | 3 refills | Status: DC

## 2016-02-26 NOTE — Patient Instructions (Addendum)
PLEASE CALL ME WITH ANY PROBLEMS OR QUESTIONS ([phone])  

## 2016-02-26 NOTE — Progress Notes (Signed)
Subjective:    Patient ID: [name], female    DOB: 01-Jun-1945, 70 y.o.   MRN: [id number]  HPI  [name] is here in follow up of her rehab stay and lumbar spondylosis and radiculopathy. She was at a SNF immediately after rehab and eventually transferred home. She was back in the ED about a month ago when she didn't feel right---work up was essentially negative except for dehydration/ increased BUN/Cr.   Since she's been home she complains of weight loss, ?reflux vs chest pain and ongoing low back and leg pain (although the back and leg pain is better than prior to her surgery). It has been inconsistent.   She does feel more tired and feels that her balance is "off". She denies any falls. She is driving now.   Pain Inventory Average Pain 9 Pain Right Now 9 My pain is constant, burning, stabbing and aching  In the last 24 hours, has pain interfered with the following? General activity 8 Relation with others 8 Enjoyment of life 8 What TIME of day is your pain at its worst? night Sleep (in general) Poor  Pain is worse with: walking, bending and standing Pain improves with: medication Relief from Meds: 2  Mobility walk with assistance ability to climb steps?  no do you drive?  yes transfers alone  Function not employed: date last employed . disabled: date disabled . I need assistance with the following:  dressing, bathing, meal prep, household duties and shopping  Neuro/Psych weakness numbness trouble walking confusion anxiety  Prior Studies Any changes since last visit?  no  Physicians involved in your care Any changes since last visit?  no   Family History  Problem Relation Age of Onset  . Mental illness Mother   . Hypertension Mother   . Arthritis Other   . Hypertension Other    Social History   Social History  . Marital status: Single    Spouse name: N/A  . Number of children: N/A  . Years of education: N/A   Social History Main  Topics  . Smoking status: Current Every Day Smoker    Packs/day: 0.25    Years: 40.00    Types: Cigarettes  . 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 Alcohol use No  . Drug use: No  . Sexual activity: No     Comment: no sex x 20 yrs   Other Topics Concern  . None   Social History Narrative  . None   Past Surgical History:  Procedure Laterality Date  . ANTERIOR LAT LUMBAR FUSION Right 12/30/2015   Procedure: Lumbar two-three, Lumbar three-four Anterior Lateral Lumbar Interbody Fusion with Anterior column realignment at Lumbar three-four;  Surgeon: [name], MD;  Location: [facility];  Service: Neurosurgery;  Laterality: Right;  . Closure of bronchopleural fistula, right lower lobe.    [name] COLONOSCOPY    . Decompressive Laminectomy     Fusion L4-5, L5-S1 (Cram-2004)  . Decortication of right lower lobe    . Placement of wound On-Q analgesia irrigation system.    [name] POSTERIOR LUMBAR FUSION 4 LEVEL N/A 12/30/2015   Procedure: Lumbar two -Sacral one Dorsal Internal Fixation and Fusion, [name] osteotomies Lumbar two-three, Lumbar three-four;  Surgeon: [name], MD;  Location: [facility];  Service: Neurosurgery;  Laterality: N/A;  . Right hand  1995  . Right knee  1990  . Right video-assisted thoracoscopic surgery, drainage of empyema.  02/12/2011   Past Medical History:  Diagnosis Date  . ALLERGIC RHINITIS   . Arthritis   . ASTHMA, UNSPECIFIED, UNSPECIFIED STATUS   . DYSLIPIDEMIA   . Headache(784.0)   . HYPERTENSION   . Hypertension   . Mild renal insufficiency   . PNA (pneumonia)   . Shortness of breath dyspnea   . SPINAL STENOSIS, LUMBAR    MRI 12/2005; surg 2004  . TOBACCO ABUSE    BP 103/65 (BP Location: Left Arm, Patient Position: Sitting, Cuff Size: Normal)   Pulse 90   Resp 14   SpO2 94%   Opioid Risk Score:   Fall Risk Score:  `1  Depression screen PHQ  2/9  No flowsheet data found.  Review of Systems  Constitutional: Positive for unexpected weight change.  Eyes: Negative.   Respiratory: Positive for wheezing.   Cardiovascular: Negative.   Gastrointestinal: Negative.   Endocrine: Negative.   Genitourinary: Negative.   Musculoskeletal: Positive for back pain and gait problem.  Neurological: Positive for weakness and numbness.  Psychiatric/Behavioral: Positive for confusion. The patient is nervous/anxious.   All other systems reviewed and are negative.      Objective:   Physical Exam Gen: weight loss noted Head: Normocephalicand atraumatic.  Mouth/Throat: Oropharynx is clear and moist.  Eyes: Conjunctivaeand EOMare normal. Pupils are equal, round, and reactive to light.  Neck: Normal range of motion. Neck supple.  Cardiovascular: Normal rateand regular rhythm.  No murmurheard. Respiratory: Effort normaland breath sounds normal. No stridor. No respiratory distress. She has no wheezes.  GI: Soft. Bowel sounds are normal.  There is no tenderness.  Musculoskeletal: She exhibits no edemaor tenderness.  Neurological: She is alertand oriented to person, place, and time.  Tangential.  Motor strength is 3 minus bilateral hip flexor, knee extensor, ankle dorsiflexor. Decreased sensory to LT /pain mid thighs bilaterally. Gait is wide based and tends to drag or shuffle at times.  Skin: Skin is warmand dry. Incisions clean/dry  Psychiatric: very pleasant and cooperative. remains tangential      Assessment & Plan:  1. 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 Also with distal polynueropathy              --make a referral to outpt PT to address LE strength, balance 2.  Pain Management: gabapentin for neuropathic leg pain---explained the medical basis behind neuropathy and nerve related pain today              -kpad and local measures  for pain too   3. Mood: continue supervision 6. Skin/Wound Care: Monitor back wound daily for healing 7. Fluids/Electrolytes/Nutrition:              -check bmet today given ongoing dizziness  -discussed appropriate nutrition 8. HTN: continue lasix and lopressor. Reduce cozaar to 50mg  daily  -daily monitoring at home  -follow up with primary. .  10 COPD/Asthma: Encourage IS with fluter valve. Respiratory status stable on Dulera, Spiriva and Singulair.  11. CKD:   ?outpt nephrology follow up vs follow up with PCP      12. Constipation: responsive to meds 13. Post-op anemia: will send for follow up CBC today given dizziness  Follow up in 2 months. Thirty minutes of face to face patient care time were spent during this visit. All questions were encouraged and answered.

## 2016-02-26 NOTE — Telephone Encounter (Signed)
Patient was supposed to get a cream sent into her pharmacy [facility] [address].  Please call patient when this is complete.

## 2016-02-27 LAB — CBC
HEMATOCRIT: 35.7 % (ref 34.0–46.6)
HEMOGLOBIN: 12.1 g/dL (ref 11.1–15.9)
MCH: 30.2 pg (ref 26.6–33.0)
MCHC: 33.9 g/dL (ref 31.5–35.7)
MCV: 89 fL (ref 79–97)
PLATELETS: 286 10*3/uL (ref 150–379)
RBC: 4.01 x10E6/uL (ref 3.77–5.28)
RDW: 13.4 % (ref 12.3–15.4)
WBC: 8.5 10*3/uL (ref 3.4–10.8)

## 2016-02-27 LAB — BASIC METABOLIC PANEL
BUN / CREAT RATIO: 17 (ref 12–28)
BUN: 24 mg/dL (ref 8–27)
CHLORIDE: 104 mmol/L (ref 96–106)
CO2: 31 mmol/L — ABNORMAL HIGH (ref 18–29)
Calcium: 10.8 mg/dL — ABNORMAL HIGH (ref 8.7–10.3)
Creatinine, Ser: 1.39 mg/dL — ABNORMAL HIGH (ref 0.57–1.00)
GFR calc non Af Amer: 39 mL/min/{1.73_m2} — ABNORMAL LOW (ref >59)
GFR, EST AFRICAN AMERICAN: 45 mL/min/{1.73_m2} — AB (ref >59)
GLUCOSE: 73 mg/dL (ref 65–99)
POTASSIUM: 3.9 mmol/L (ref 3.5–5.2)
SODIUM: 140 mmol/L (ref 134–144)

## 2016-02-27 NOTE — Telephone Encounter (Signed)
[name] has called back about the cream you talked about in the appt yesterday.  I do not see anything in your note about it but this is the 2nd call we have received from her about this cream.  Do you know what she is talking about?

## 2016-02-28 MED ORDER — DICLOFENAC SODIUM 1 % TD GEL: 1.0000 "application " | Freq: Three times a day (TID) | TRANSDERMAL | 4 refills | Status: DC

## 2016-02-28 NOTE — Addendum Note (Signed)
Addended by: Faith RogueSWARTZ, Cerita Rabelo T on: 02/28/2016 02:11 PM   Modules accepted: Orders

## 2016-02-28 NOTE — Telephone Encounter (Signed)
Please let Mrs. [name] know I reviewed her labwork. Her kidney function is still abnormal, but her labs are stable to improved from a month ago. All other labs look ok.   thanks

## 2016-02-28 NOTE — Telephone Encounter (Signed)
Does she remember what I said I wanted to use it for? I dont' recall recommending a cream

## 2016-02-28 NOTE — Telephone Encounter (Addendum)
I spent 30 minutes on the phone with her trying to get to the bottom of what this cream is but got nowhere.  She says she is NOT taking celebrex and has never taken it...(could you have mentioned voltaren gel?).  She is very upset and crying and wants you to call her.  She is feeling like everyone was unresponsive to her needs while in the hospital.  She does not remember anyone but the physical therapist at the hospital and not [facility] or any of the MDs. She says she thought you were very attentive in the appointment and was listening to her but doesn't understand why you dont remember the cream. She wants you to call her. (I told her about the labs)

## 2016-02-28 NOTE — Telephone Encounter (Addendum)
She can use voltaren gel TID to feet/knees, #3 tubes, 3RF. Order written/patient called

## 2016-03-19 ENCOUNTER — Emergency Department (HOSPITAL_COMMUNITY): Payer: Medicare Other

## 2016-03-19 ENCOUNTER — Inpatient Hospital Stay (HOSPITAL_COMMUNITY)
Admission: EM | Admit: 2016-03-19 | Discharge: 2016-03-24 | DRG: 092 | Disposition: A | Discharge status: Home Health | Payer: Medicare Other | Attending: Internal Medicine | Admitting: Internal Medicine

## 2016-03-19 DIAGNOSIS — F17210 Nicotine dependence, cigarettes, uncomplicated: Secondary | ICD-10-CM | POA: Diagnosis present

## 2016-03-19 DIAGNOSIS — E860 Dehydration: Secondary | ICD-10-CM | POA: Diagnosis present

## 2016-03-19 DIAGNOSIS — Z91013 Allergy to seafood: Secondary | ICD-10-CM

## 2016-03-19 DIAGNOSIS — E785 Hyperlipidemia, unspecified: Secondary | ICD-10-CM | POA: Diagnosis present

## 2016-03-19 DIAGNOSIS — I10 Essential (primary) hypertension: Secondary | ICD-10-CM | POA: Diagnosis present

## 2016-03-19 DIAGNOSIS — J449 Chronic obstructive pulmonary disease, unspecified: Secondary | ICD-10-CM | POA: Diagnosis present

## 2016-03-19 DIAGNOSIS — W19XXXA Unspecified fall, initial encounter: Secondary | ICD-10-CM

## 2016-03-19 DIAGNOSIS — R7989 Other specified abnormal findings of blood chemistry: Secondary | ICD-10-CM

## 2016-03-19 DIAGNOSIS — Z79899 Other long term (current) drug therapy: Secondary | ICD-10-CM

## 2016-03-19 DIAGNOSIS — G92 Toxic encephalopathy: Secondary | ICD-10-CM | POA: Diagnosis not present

## 2016-03-19 DIAGNOSIS — T424X5A Adverse effect of benzodiazepines, initial encounter: Secondary | ICD-10-CM | POA: Diagnosis present

## 2016-03-19 DIAGNOSIS — Y92009 Unspecified place in unspecified non-institutional (private) residence as the place of occurrence of the external cause: Secondary | ICD-10-CM

## 2016-03-19 DIAGNOSIS — L84 Corns and callosities: Secondary | ICD-10-CM | POA: Diagnosis present

## 2016-03-19 DIAGNOSIS — Z8249 Family history of ischemic heart disease and other diseases of the circulatory system: Secondary | ICD-10-CM

## 2016-03-19 DIAGNOSIS — N179 Acute kidney failure, unspecified: Secondary | ICD-10-CM

## 2016-03-19 DIAGNOSIS — R778 Other specified abnormalities of plasma proteins: Secondary | ICD-10-CM | POA: Diagnosis present

## 2016-03-19 DIAGNOSIS — R9401 Abnormal electroencephalogram [EEG]: Secondary | ICD-10-CM | POA: Diagnosis present

## 2016-03-19 DIAGNOSIS — Z981 Arthrodesis status: Secondary | ICD-10-CM

## 2016-03-19 DIAGNOSIS — T426X5A Adverse effect of other antiepileptic and sedative-hypnotic drugs, initial encounter: Secondary | ICD-10-CM | POA: Diagnosis present

## 2016-03-19 DIAGNOSIS — G9340 Encephalopathy, unspecified: Secondary | ICD-10-CM

## 2016-03-19 DIAGNOSIS — R4182 Altered mental status, unspecified: Secondary | ICD-10-CM | POA: Diagnosis present

## 2016-03-19 DIAGNOSIS — F419 Anxiety disorder, unspecified: Secondary | ICD-10-CM | POA: Diagnosis present

## 2016-03-19 LAB — COMPREHENSIVE METABOLIC PANEL
ALT: 8 U/L — AB (ref 14–54)
AST: 14 U/L — AB (ref 15–41)
Albumin: 3.4 g/dL — ABNORMAL LOW (ref 3.5–5.0)
Alkaline Phosphatase: 58 U/L (ref 38–126)
Anion gap: 9 (ref 5–15)
BUN: 33 mg/dL — AB (ref 6–20)
CHLORIDE: 114 mmol/L — AB (ref 101–111)
CO2: 22 mmol/L (ref 22–32)
CREATININE: 2.05 mg/dL — AB (ref 0.44–1.00)
Calcium: 10.4 mg/dL — ABNORMAL HIGH (ref 8.9–10.3)
GFR calc Af Amer: 28 mL/min — ABNORMAL LOW (ref >60)
GFR calc non Af Amer: 24 mL/min — ABNORMAL LOW (ref >60)
Glucose, Bld: 94 mg/dL (ref 65–99)
POTASSIUM: 4.6 mmol/L (ref 3.5–5.1)
SODIUM: 145 mmol/L (ref 135–145)
Total Bilirubin: 0.3 mg/dL (ref 0.3–1.2)
Total Protein: 7.6 g/dL (ref 6.5–8.1)

## 2016-03-19 LAB — CBC
HEMATOCRIT: 37.1 % (ref 36.0–46.0)
Hemoglobin: 11.8 g/dL — ABNORMAL LOW (ref 12.0–15.0)
MCH: 29.4 pg (ref 26.0–34.0)
MCHC: 31.8 g/dL (ref 30.0–36.0)
MCV: 92.3 fL (ref 78.0–100.0)
PLATELETS: 294 10*3/uL (ref 150–400)
RBC: 4.02 MIL/uL (ref 3.87–5.11)
RDW: 14.2 % (ref 11.5–15.5)
WBC: 13.7 10*3/uL — ABNORMAL HIGH (ref 4.0–10.5)

## 2016-03-19 LAB — I-STAT VENOUS BLOOD GAS, ED
ACID-BASE DEFICIT: 6 mmol/L — AB (ref 0.0–2.0)
BICARBONATE: 21.3 mmol/L (ref 20.0–28.0)
O2 Saturation: 31 %
PH VEN: 7.268 (ref 7.250–7.430)
TCO2: 23 mmol/L (ref 0–100)
pCO2, Ven: 46.6 mmHg (ref 44.0–60.0)
pO2, Ven: 22 mmHg — CL (ref 32.0–45.0)

## 2016-03-19 LAB — PROTIME-INR
INR: 1.11
Prothrombin Time: 14.4 seconds (ref 11.4–15.2)

## 2016-03-19 LAB — SALICYLATE LEVEL: Salicylate Lvl: 27.8 mg/dL (ref 2.8–30.0)

## 2016-03-19 LAB — ACETAMINOPHEN LEVEL

## 2016-03-19 LAB — I-STAT CG4 LACTIC ACID, ED: LACTIC ACID, VENOUS: 1.56 mmol/L (ref 0.5–1.9)

## 2016-03-19 LAB — CBG MONITORING, ED: Glucose-Capillary: 91 mg/dL (ref 65–99)

## 2016-03-19 LAB — AMMONIA: Ammonia: 27 umol/L (ref 9–35)

## 2016-03-19 MED ORDER — NALOXONE HCL 0.4 MG/ML IJ SOLN
0.4000 mg | Freq: Once | INTRAMUSCULAR | Status: AC
  Administered 2016-03-19: 0.4 mg via INTRAVENOUS
  Filled 2016-03-19: qty 1

## 2016-03-19 MED ORDER — SODIUM CHLORIDE 0.9 % IV BOLUS (SEPSIS)
1000.0000 mL | Freq: Once | INTRAVENOUS | Status: AC
  Administered 2016-03-19: 1000 mL via INTRAVENOUS

## 2016-03-19 NOTE — ED Notes (Signed)
IV Team at bedside 

## 2016-03-19 NOTE — ED Notes (Signed)
Made two unsuccessful attempts to draw additional labs. Asked phlebotomy to make attempt.

## 2016-03-19 NOTE — ED Notes (Signed)
Family of pt disconnected her and took her to bathroom. No urine sample collected. Family states that they "called but no one came".

## 2016-03-19 NOTE — ED Notes (Addendum)
Patient transported to CT 

## 2016-03-19 NOTE — ED Notes (Addendum)
Multiples attempts to gain IV access made by this RN & another RN, all unsuccessful. Placed order for IV Team.

## 2016-03-19 NOTE — ED Notes (Signed)
Pt's rectal temp was 99.6. Informed Marquita PalmsMario - RN.

## 2016-03-19 NOTE — ED Notes (Addendum)
Pt placed on bedpan. No significant incr in alertness after receiving narcan.

## 2016-03-19 NOTE — ED Triage Notes (Signed)
Pt's daughter st's pt's mental status has been declining over past 2-3 days, worse today.  St's pt had back surg and is currently on several meds and narcotics.  Pt also has had staggering gait and slurred speech.

## 2016-03-19 NOTE — ED Provider Notes (Signed)
[facility]PT Provider Note   CSN: [id number] Arrival date & time: 03/19/16  [id number]  History   Chief Complaint Chief Complaint  Patient presents with  . Altered Mental Status    HPI [name] is a 70 y.o. female.   Altered Mental Status   This is a new problem. The current episode started more than 2 days ago. The problem has been gradually worsening. Associated symptoms include confusion, somnolence, weakness and hallucinations. Pertinent negatives include no seizures. Risk factors include the patient not taking medications correctly. Her past medical history is significant for COPD. Her past medical history does not include seizures.   Past Medical History:  Diagnosis Date  . ALLERGIC RHINITIS   . Arthritis   . ASTHMA, UNSPECIFIED, UNSPECIFIED STATUS   . DYSLIPIDEMIA   . Headache(784.0)   . HYPERTENSION   . Hypertension   . Mild renal insufficiency   . PNA (pneumonia)   . Shortness of breath dyspnea   . SPINAL STENOSIS, LUMBAR    MRI 12/2005; surg 2004  . TOBACCO ABUSE    Patient Active Problem List   Diagnosis Date Noted  . Acute on chronic renal failure (HCC) 01/13/2016  . Neuropathic pain   . Anxiety state   . Sleep disturbance   . Post-operative pain   . Spondylolisthesis of lumbar region 01/02/2016  . Surgery, elective   . Chronic obstructive pulmonary disease (HCC)   . Respiratory failure with hypercapnia (HCC)   . Chronic back pain   . Postoperative anemia due to acute blood loss   . Tachycardia   . Leukocytosis   . Thrombocytopenia (HCC)   . Lumbosacral spondylosis with radiculopathy 12/30/2015  . Acute encephalopathy   . AKI (acute kidney injury) (HCC)   . CKD (chronic kidney disease)   . HEADACHE 08/16/2009  . DYSLIPIDEMIA 02/20/2009  . TOBACCO ABUSE 02/20/2009  . Benign essential HTN 02/20/2009  . ALLERGIC RHINITIS 02/20/2009  . Asthma 02/20/2009  . SHOULDER PAIN, RIGHT, CHRONIC 02/20/2009  . HIP PAIN, RIGHT, CHRONIC 02/20/2009  .  SPINAL STENOSIS, CERVICAL 02/20/2009  . SPINAL STENOSIS, LUMBAR 02/20/2009  . BACK PAIN, LUMBAR 02/20/2009   Past Surgical History:  Procedure Laterality Date  . ANTERIOR LAT LUMBAR FUSION Right 12/30/2015   Procedure: Lumbar two-three, Lumbar three-four Anterior Lateral Lumbar Interbody Fusion with Anterior column realignment at Lumbar three-four;  Surgeon: Loura HaltBenjamin Jared Ditty, MD;  Location: [facility];  Service: Neurosurgery;  Laterality: Right;  . Closure of bronchopleural fistula, right lower lobe.    [name]. COLONOSCOPY    . Decompressive Laminectomy     Fusion L4-5, L5-S1 ([facility]-2004)  . Decortication of right lower lobe    . Placement of wound On-Q analgesia irrigation system.    [name]. POSTERIOR LUMBAR FUSION 4 LEVEL N/A 12/30/2015   Procedure: Lumbar two -Sacral one Dorsal Internal Fixation and Fusion, [name] osteotomies Lumbar two-three, Lumbar three-four;  Surgeon: Loura HaltBenjamin Jared Ditty, MD;  Location: [facility];  Service: Neurosurgery;  Laterality: N/A;  . Right hand  1995  . Right knee  1990  . Right video-assisted thoracoscopic surgery, drainage of empyema.  02/12/2011    OB History    Gravida Para Term Preterm AB Living   5 4 4   1 4    SAB TAB Ectopic Multiple Live Births     1             Home Medications    Prior to Admission medications   Medication Sig Start  Date End Date Taking? Authorizing Provider  albuterol (PROVENTIL HFA;VENTOLIN HFA) 108 (90 BASE) MCG/ACT inhaler Inhale 2 puffs into the lungs every 6 (six) hours as needed for wheezing or shortness of breath.     Historical Provider, MD  ALPRAZolam Prudy Feeler) 0.25 MG tablet Take 0.25 mg by mouth at bedtime as needed for anxiety.    Historical Provider, MD  cetirizine (ZYRTEC) 10 MG tablet Take 10 mg by mouth daily.    Historical Provider, MD  diclofenac sodium (VOLTAREN) 1 % GEL Apply 1 application topically 3 (three) times daily. Feet and knees 02/28/16   Ranelle Oyster, MD  diltiazem (CARDIZEM CD) 240 MG  24 hr capsule Take 240 mg by mouth daily.     Historical Provider, MD  Fluticasone-Salmeterol (ADVAIR DISKUS) 250-50 MCG/DOSE AEPB Inhale 1 puff into the lungs 2 (two) times daily.      Historical Provider, MD  furosemide (LASIX) 40 MG tablet Take 40 mg by mouth.    Historical Provider, MD  gabapentin (NEURONTIN) 300 MG capsule Take 2 capsules (600 mg total) by mouth 3 (three) times daily. 01/13/16   Evlyn Kanner Love, PA-C  losartan (COZAAR) 50 MG tablet Take 1 tablet (50 mg total) by mouth daily. 02/26/16   Ranelle Oyster, MD  metoprolol (LOPRESSOR) 50 MG tablet Take 50 mg by mouth 2 (two) times daily.    Historical Provider, MD  montelukast (SINGULAIR) 10 MG tablet Take 10 mg by mouth at bedtime.    Historical Provider, MD  oxyCODONE-acetaminophen (PERCOCET) 7.5-325 MG tablet Take 1 tablet by mouth every 6 (six) hours as needed for severe pain.    Historical Provider, MD  rosuvastatin (CRESTOR) 10 MG tablet Take 10 mg by mouth daily.     Historical Provider, MD  sennosides-docusate sodium (SENOKOT-S) 8.6-50 MG tablet Take 2 tablets by mouth 2 (two) times daily.    Historical Provider, MD  tiotropium (SPIRIVA) 18 MCG inhalation capsule Place 18 mcg into inhaler and inhale daily.      Historical Provider, MD   Family History Family History  Problem Relation Age of Onset  . Mental illness Mother   . Hypertension Mother   . Arthritis Other   . Hypertension Other    Social History Social History  Substance Use Topics  . Smoking status: Current Every Day Smoker    Packs/day: 0.25    Years: 40.00    Types: Cigarettes  . 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 Alcohol use No   Allergies   Shrimp [shellfish allergy]  Review of Systems Review of Systems  Unable to perform ROS: Mental status change  Constitutional: Negative for fever.  Respiratory: Positive for cough. Negative for shortness of breath.     Cardiovascular: Negative for chest pain.  Gastrointestinal: Negative for abdominal pain and blood in stool.  Neurological: Positive for weakness. Negative for seizures.  Psychiatric/Behavioral: Positive for confusion and hallucinations.  All other systems reviewed and are negative.  ROS LIMITED TO FAMILY'S RECOLLECTION  Physical Exam Updated Vital Signs BP 110/76   Pulse 89   Temp 98.7 F (37.1 C) (Oral)   Resp 19   SpO2 97%   Physical Exam  Constitutional: No distress.  HENT:  Head: Normocephalic and atraumatic.  Eyes: Pupils are equal, round, and reactive to light.  3 MM PERL  Neck: Normal range of motion.  Cardiovascular: Normal rate.   Normal rate at  90  Pulmonary/Chest: Effort normal.  Bilateral course rhonchi in lower bases  Abdominal: Soft. She exhibits no distension and no mass. There is no rebound and no guarding.  Musculoskeletal: Normal range of motion. She exhibits no edema.  Neurological:  Somnolent but arousable to voice.   Patient confused and answers inappropriately.   No facial droop.   5/5 intrinsic hand grip, bicep flexion, tricep extension  5/5 plantar flexion, dorsiflexion and hip flexion  Skin: Skin is warm. Capillary refill takes less than 2 seconds. She is not diaphoretic.  No sacral ulcer noted.   Lumbar fusion scar well healed  Nursing note and vitals reviewed.  ED Treatments / Results  Labs (all labs ordered are listed, but only abnormal results are displayed) Labs Reviewed  COMPREHENSIVE METABOLIC PANEL  CBC  PROTIME-INR  CBG MONITORING, ED  CBG MONITORING, ED    EKG  EKG Interpretation  Date/Time:  Thursday March 19 2016 19:19:02 EDT Ventricular Rate:  89 PR Interval:    QRS Duration: 104 QT Interval:  348 QTC Calculation: 424 R Axis:   37 Text Interpretation:  Sinus rhythm Minimal ST depression, inferior leads Confirmed by DELO  MD, DOUGLAS ([id number]) on 03/19/2016 7:28:47 PM       Radiology No results  found.  Procedures Procedures (including critical care time)  Medications Ordered in ED Medications - No data to display  Initial Impression / Assessment and Plan / ED Course  I have reviewed the triage vital signs and the nursing notes.  Pertinent labs & imaging results that were available during my care of the patient were reviewed by me and considered in my medical decision making (see chart for details).  Clinical Course   Patient is a 70 year old female multiple comorbidities including recent lumbar fusion in August and recently discharged from skilled nursing facility who presents to the emergency department today with altered mental status is slow progression and described his somnolence and slurred speech since Monday.  Family's concern about polypharmacy. They deny any fevers but statesthat she has had a cough. Unfortunately most of the review of system is limited in fact they do not stay with Ms. Shuffler.  Patient has no pneumonia on x-ray, slight leukocytosis but normal lactic acid. UA pending.  Patient noted to have AKI.  Ammonia normal.  Acetaminophen undetectable. Salicylate detectable but under threshold and more than likely secondary to Goody's powder.   CT head unremarkable although limited secondary to patient's movements. Patient appears to be atraumatic.  Patient given small amount of narcan with slight increase in mentation but agitation.   I believe her altered mental status, without any significant focal neurological deficits, to be secondary to polypharmacy versus possible urinary tract infection.   Patient unable to take care of herself and will need hospitalist admission for the same. UA pending at this time and will treat with ceftriaxone if positive UA.  Patient admitted to the telemetry floor.  Final Clinical Impressions(s) / ED Diagnoses   Final diagnoses:  Altered mental status, unspecified altered mental status type  AKI (acute kidney injury)  [facility](HCC)    New Prescriptions New Prescriptions   No medications on file     Deirdre PeerJeremiah Akaash Vandewater, MD 03/20/16 [id number]    Geoffery Lyonsouglas Delo, MD 03/20/16 2348

## 2016-03-20 ENCOUNTER — Observation Stay (HOSPITAL_COMMUNITY): Payer: Medicare Other

## 2016-03-20 ENCOUNTER — Encounter (HOSPITAL_COMMUNITY): Payer: Self-pay | Admitting: Internal Medicine

## 2016-03-20 DIAGNOSIS — N179 Acute kidney failure, unspecified: Secondary | ICD-10-CM | POA: Diagnosis present

## 2016-03-20 DIAGNOSIS — R748 Abnormal levels of other serum enzymes: Secondary | ICD-10-CM | POA: Diagnosis not present

## 2016-03-20 DIAGNOSIS — F419 Anxiety disorder, unspecified: Secondary | ICD-10-CM | POA: Diagnosis present

## 2016-03-20 DIAGNOSIS — R9401 Abnormal electroencephalogram [EEG]: Secondary | ICD-10-CM | POA: Diagnosis present

## 2016-03-20 DIAGNOSIS — G92 Toxic encephalopathy: Secondary | ICD-10-CM | POA: Diagnosis present

## 2016-03-20 DIAGNOSIS — G9340 Encephalopathy, unspecified: Secondary | ICD-10-CM | POA: Diagnosis not present

## 2016-03-20 DIAGNOSIS — L84 Corns and callosities: Secondary | ICD-10-CM | POA: Diagnosis present

## 2016-03-20 DIAGNOSIS — I10 Essential (primary) hypertension: Secondary | ICD-10-CM | POA: Diagnosis not present

## 2016-03-20 DIAGNOSIS — Y92009 Unspecified place in unspecified non-institutional (private) residence as the place of occurrence of the external cause: Secondary | ICD-10-CM | POA: Diagnosis not present

## 2016-03-20 DIAGNOSIS — Z981 Arthrodesis status: Secondary | ICD-10-CM | POA: Diagnosis not present

## 2016-03-20 DIAGNOSIS — T424X5A Adverse effect of benzodiazepines, initial encounter: Secondary | ICD-10-CM | POA: Diagnosis present

## 2016-03-20 DIAGNOSIS — F17210 Nicotine dependence, cigarettes, uncomplicated: Secondary | ICD-10-CM | POA: Diagnosis present

## 2016-03-20 DIAGNOSIS — Z79899 Other long term (current) drug therapy: Secondary | ICD-10-CM | POA: Diagnosis not present

## 2016-03-20 DIAGNOSIS — E860 Dehydration: Secondary | ICD-10-CM | POA: Diagnosis present

## 2016-03-20 DIAGNOSIS — E785 Hyperlipidemia, unspecified: Secondary | ICD-10-CM | POA: Diagnosis present

## 2016-03-20 DIAGNOSIS — T426X5A Adverse effect of other antiepileptic and sedative-hypnotic drugs, initial encounter: Secondary | ICD-10-CM | POA: Diagnosis present

## 2016-03-20 DIAGNOSIS — Z8249 Family history of ischemic heart disease and other diseases of the circulatory system: Secondary | ICD-10-CM | POA: Diagnosis not present

## 2016-03-20 DIAGNOSIS — R000 Tachycardia, unspecified: Secondary | ICD-10-CM | POA: Diagnosis not present

## 2016-03-20 DIAGNOSIS — J449 Chronic obstructive pulmonary disease, unspecified: Secondary | ICD-10-CM | POA: Diagnosis present

## 2016-03-20 DIAGNOSIS — R778 Other specified abnormalities of plasma proteins: Secondary | ICD-10-CM | POA: Diagnosis present

## 2016-03-20 DIAGNOSIS — Z91013 Allergy to seafood: Secondary | ICD-10-CM | POA: Diagnosis not present

## 2016-03-20 LAB — HEPATIC FUNCTION PANEL
ALT: 6 U/L — ABNORMAL LOW (ref 14–54)
AST: 11 U/L — ABNORMAL LOW (ref 15–41)
Albumin: 3 g/dL — ABNORMAL LOW (ref 3.5–5.0)
Alkaline Phosphatase: 50 U/L (ref 38–126)
Bilirubin, Direct: <0.1 mg/dL — ABNORMAL LOW (ref 0.1–0.5)
Total Bilirubin: 0.5 mg/dL (ref 0.3–1.2)
Total Protein: 6.4 g/dL — ABNORMAL LOW (ref 6.5–8.1)

## 2016-03-20 LAB — CBC WITH DIFFERENTIAL/PLATELET
Basophils Absolute: 0 10*3/uL (ref 0.0–0.1)
Basophils Relative: 0 %
Eosinophils Absolute: 0.2 10*3/uL (ref 0.0–0.7)
Eosinophils Relative: 2 %
HCT: 34.3 % — ABNORMAL LOW (ref 36.0–46.0)
Hemoglobin: 10.8 g/dL — ABNORMAL LOW (ref 12.0–15.0)
Lymphocytes Relative: 17 %
Lymphs Abs: 2.3 10*3/uL (ref 0.7–4.0)
MCH: 28.8 pg (ref 26.0–34.0)
MCHC: 31.5 g/dL (ref 30.0–36.0)
MCV: 91.5 fL (ref 78.0–100.0)
Monocytes Absolute: 0.4 10*3/uL (ref 0.1–1.0)
Monocytes Relative: 3 %
Neutro Abs: 10.2 10*3/uL — ABNORMAL HIGH (ref 1.7–7.7)
Neutrophils Relative %: 78 %
Platelets: 258 10*3/uL (ref 150–400)
RBC: 3.75 MIL/uL — ABNORMAL LOW (ref 3.87–5.11)
RDW: 14.2 % (ref 11.5–15.5)
WBC: 13.1 10*3/uL — ABNORMAL HIGH (ref 4.0–10.5)

## 2016-03-20 LAB — TROPONIN I
TROPONIN I: 0.03 ng/mL — AB (ref <0.03)
TROPONIN I: 0.03 ng/mL — AB (ref <0.03)
TROPONIN I: 0.13 ng/mL — AB (ref <0.03)

## 2016-03-20 LAB — URINALYSIS, ROUTINE W REFLEX MICROSCOPIC
BILIRUBIN URINE: NEGATIVE
Glucose, UA: NEGATIVE mg/dL
HGB URINE DIPSTICK: NEGATIVE
Ketones, ur: NEGATIVE mg/dL
Leukocytes, UA: NEGATIVE
Nitrite: NEGATIVE
PH: 5 (ref 5.0–8.0)
Protein, ur: NEGATIVE mg/dL
SPECIFIC GRAVITY, URINE: 1.01 (ref 1.005–1.030)

## 2016-03-20 LAB — BASIC METABOLIC PANEL
Anion gap: 7 (ref 5–15)
BUN: 29 mg/dL — ABNORMAL HIGH (ref 6–20)
CALCIUM: 10.1 mg/dL (ref 8.9–10.3)
CO2: 20 mmol/L — AB (ref 22–32)
CREATININE: 1.65 mg/dL — AB (ref 0.44–1.00)
Chloride: 118 mmol/L — ABNORMAL HIGH (ref 101–111)
GFR calc non Af Amer: 31 mL/min — ABNORMAL LOW (ref >60)
GFR, EST AFRICAN AMERICAN: 36 mL/min — AB (ref >60)
Glucose, Bld: 88 mg/dL (ref 65–99)
Potassium: 3.9 mmol/L (ref 3.5–5.1)
SODIUM: 145 mmol/L (ref 135–145)

## 2016-03-20 LAB — CK: Total CK: 89 U/L (ref 38–234)

## 2016-03-20 LAB — RAPID URINE DRUG SCREEN, HOSP PERFORMED
Amphetamines: NOT DETECTED
BARBITURATES: NOT DETECTED
Benzodiazepines: POSITIVE — AB
Cocaine: NOT DETECTED
Opiates: NOT DETECTED
TETRAHYDROCANNABINOL: NOT DETECTED

## 2016-03-20 LAB — SALICYLATE LEVEL: Salicylate Lvl: 22.2 mg/dL (ref 2.8–30.0)

## 2016-03-20 LAB — GLUCOSE, CAPILLARY
GLUCOSE-CAPILLARY: 85 mg/dL (ref 65–99)
GLUCOSE-CAPILLARY: 89 mg/dL (ref 65–99)
GLUCOSE-CAPILLARY: 90 mg/dL (ref 65–99)
GLUCOSE-CAPILLARY: 79 mg/dL (ref 65–99)
Glucose-Capillary: 85 mg/dL (ref 65–99)

## 2016-03-20 MED ORDER — TIOTROPIUM BROMIDE MONOHYDRATE 18 MCG IN CAPS
18.0000 ug | ORAL_CAPSULE | Freq: Every day | RESPIRATORY_TRACT | Status: DC
  Administered 2016-03-21 – 2016-03-22 (×2): 18 ug via RESPIRATORY_TRACT
  Filled 2016-03-20 (×2): qty 5

## 2016-03-20 MED ORDER — MOMETASONE FURO-FORMOTEROL FUM 200-5 MCG/ACT IN AERO
2.0000 | INHALATION_SPRAY | Freq: Two times a day (BID) | RESPIRATORY_TRACT | Status: DC
  Administered 2016-03-20 – 2016-03-23 (×7): 2 via RESPIRATORY_TRACT
  Filled 2016-03-20 (×2): qty 8.8

## 2016-03-20 MED ORDER — HALOPERIDOL 2 MG PO TABS
2.5000 mg | ORAL_TABLET | Freq: Three times a day (TID) | ORAL | Status: DC | PRN
  Administered 2016-03-21: 2.5 mg via ORAL
  Filled 2016-03-20 (×3): qty 1

## 2016-03-20 MED ORDER — VITAMIN B-1 100 MG PO TABS
100.0000 mg | ORAL_TABLET | Freq: Every day | ORAL | Status: DC
  Administered 2016-03-20 – 2016-03-24 (×5): 100 mg via ORAL
  Filled 2016-03-20 (×6): qty 1

## 2016-03-20 MED ORDER — HYDRALAZINE HCL 20 MG/ML IJ SOLN: 10.0000 mg | INTRAMUSCULAR | Status: DC | PRN

## 2016-03-20 MED ORDER — METOPROLOL TARTRATE 5 MG/5ML IV SOLN
5.0000 mg | Freq: Four times a day (QID) | INTRAVENOUS | Status: DC
  Filled 2016-03-20: qty 5

## 2016-03-20 MED ORDER — HALOPERIDOL LACTATE 5 MG/ML IJ SOLN
2.0000 mg | Freq: Once | INTRAMUSCULAR | Status: AC | PRN
  Administered 2016-03-20: 2 mg via INTRAMUSCULAR
  Filled 2016-03-20: qty 1

## 2016-03-20 MED ORDER — HALOPERIDOL LACTATE 5 MG/ML IJ SOLN
2.0000 mg | Freq: Once | INTRAMUSCULAR | Status: AC
  Administered 2016-03-20: 2 mg via INTRAMUSCULAR
  Filled 2016-03-20: qty 1

## 2016-03-20 MED ORDER — HEPARIN SODIUM (PORCINE) 5000 UNIT/ML IJ SOLN
5000.0000 [IU] | Freq: Three times a day (TID) | INTRAMUSCULAR | Status: DC
  Administered 2016-03-20 – 2016-03-24 (×11): 5000 [IU] via SUBCUTANEOUS
  Filled 2016-03-20 (×10): qty 1

## 2016-03-20 MED ORDER — ALBUTEROL SULFATE (2.5 MG/3ML) 0.083% IN NEBU: 3.0000 mL | INHALATION_SOLUTION | Freq: Four times a day (QID) | RESPIRATORY_TRACT | Status: DC | PRN

## 2016-03-20 MED ORDER — SODIUM CHLORIDE 0.9 % IV SOLN
INTRAVENOUS | Status: AC
  Administered 2016-03-20: 03:00:00 via INTRAVENOUS

## 2016-03-20 MED ORDER — METOPROLOL TARTRATE 25 MG PO TABS
25.0000 mg | ORAL_TABLET | Freq: Two times a day (BID) | ORAL | Status: DC
  Administered 2016-03-20 – 2016-03-24 (×8): 25 mg via ORAL
  Filled 2016-03-20 (×9): qty 1

## 2016-03-20 MED ORDER — LORAZEPAM 2 MG/ML IJ SOLN
1.0000 mg | Freq: Once | INTRAMUSCULAR | Status: AC
  Administered 2016-03-20: 1 mg via INTRAMUSCULAR
  Filled 2016-03-20: qty 1

## 2016-03-20 NOTE — Evaluation (Signed)
Clinical/Bedside Swallow Evaluation Patient Details  Name: Amalia Haileyoni A Ellzey MRN: [id number] Date of Birth: 1945/12/06  Today's Date: 03/20/2016 Time: SLP Start Time (ACUTE ONLY): 1340 SLP Stop Time (ACUTE ONLY): 1350 SLP Time Calculation (min) (ACUTE ONLY): 10 min  Past Medical History:  Past Medical History:  Diagnosis Date  . ALLERGIC RHINITIS   . Arthritis   . ASTHMA, UNSPECIFIED, UNSPECIFIED STATUS   . DYSLIPIDEMIA   . Headache(784.0)   . HYPERTENSION   . Hypertension   . Mild renal insufficiency   . PNA (pneumonia)   . Shortness of breath dyspnea   . SPINAL STENOSIS, LUMBAR    MRI 12/2005; surg 2004  . TOBACCO ABUSE    Past Surgical History:  Past Surgical History:  Procedure Laterality Date  . ANTERIOR LAT LUMBAR FUSION Right 12/30/2015   Procedure: Lumbar two-three, Lumbar three-four Anterior Lateral Lumbar Interbody Fusion with Anterior column realignment at Lumbar three-four;  Surgeon: Loura HaltBenjamin Jared Ditty, MD;  Location: [facility];  Service: Neurosurgery;  Laterality: Right;  . Closure of bronchopleural fistula, right lower lobe.    [name]. COLONOSCOPY    . Decompressive Laminectomy     Fusion L4-5, L5-S1 ([facility]-2004)  . Decortication of right lower lobe    . Placement of wound On-Q analgesia irrigation system.    [name]. POSTERIOR LUMBAR FUSION 4 LEVEL N/A 12/30/2015   Procedure: Lumbar two -Sacral one Dorsal Internal Fixation and Fusion, [name] osteotomies Lumbar two-three, Lumbar three-four;  Surgeon: Loura HaltBenjamin Jared Ditty, MD;  Location: [facility];  Service: Neurosurgery;  Laterality: N/A;  . Right hand  1995  . Right knee  1990  . Right video-assisted thoracoscopic surgery, drainage of empyema.  02/12/2011   HPI:  70 year old African-American female with a past medical history of hypertension, COPD, history of back pain and anxiety disorder, was brought into the emergency department after patient was found to be confused by her family members. Patient apparently  lives alone.CXR clear, MRI cannot be obtained.    Assessment / Plan / Recommendation Clinical Impression  Pt demsontrates altered mental status impairing awareness of PO. She is not able to self feed and does not repond to verbal commands or visual cues to present PO. Touching water to lips and spoons of puree to lips makes patient respond appropriately and swallow without difficulty. Pt may initaite a dys 1 (puree) diet and thin liquids and upgrade per RN or MD when mentation improves. No skilled interventions needed, will sign off.     Aspiration Risk  Mild aspiration risk    Diet Recommendation Dysphagia 1 (Puree);Thin liquid   Liquid Administration via: Cup;Straw Medication Administration: Crushed with puree Supervision: Full supervision/cueing for compensatory strategies                 Prognosis        Swallow Study   General HPI: 70 year old African-American female with a past medical history of hypertension, COPD, history of back pain and anxiety disorder, was brought into the emergency department after patient was found to be confused by her family members. Patient apparently lives alone.CXR clear, MRI cannot be obtained.  Type of Study: Bedside Swallow Evaluation Diet Prior to this Study: NPO Temperature Spikes Noted: No Respiratory Status: Room air History of Recent Intubation: No Behavior/Cognition: Alert;Uncooperative;Confused;Doesn't follow directions Oral Cavity - Dentition: Adequate natural dentition Self-Feeding Abilities: Total assist Patient Positioning: Upright in bed Baseline Vocal Quality: Normal Volitional Cough: Cognitively unable to elicit Volitional Swallow: Unable to elicit  Oral/Motor/Sensory Function Overall Oral Motor/Sensory Function: Within functional limits   Ice Chips     Thin Liquid Thin Liquid: Impaired Presentation: Straw;Cup Oral Phase Impairments: Poor awareness of bolus    Nectar Thick Nectar Thick Liquid: Not tested   Honey  Thick Honey Thick Liquid: Not tested   Puree Puree: Impaired Presentation: Spoon Oral Phase Impairments: Poor awareness of bolus   Solid   GO   Solid: Impaired Oral Phase Impairments: Poor awareness of bolus    Functional Assessment Tool Used: (P)  (clinical judgement) Functional Limitations: (P) Swallowing  Harlon DittyBonnie Keishia Ground, MA CCC-SLP [phone]  Claudine MoutonDeBlois, Amaree Loisel Caroline 03/20/2016,2:09 PM

## 2016-03-20 NOTE — Progress Notes (Signed)
[facility] PROGRESS NOTE  [name] ZOX:[id number] DOB: 08/02/1945 DOA: 03/19/2016  PCP: Rinaldo CloudHarwani, Mohan, MD  Brief History/Interval Summary: 70 year old African-American female with a past medical history of hypertension, COPD, history of back pain and anxiety disorder, was brought into the emergency department after patient was found to be confused by her family members. Patient apparently lives alone. She was hospitalized for further management.  Reason for Visit: Acute encephalopathy  Consultants: None yet  Procedures:  EEG "Impression: This awake EEG is abnormal due to diffuse slowing and presence of triphasic waves of the waking background.  Clinical Correlation of the above findings indicates diffuse cerebral dysfunction that is non-specific in etiology, but triphasic waves and can be seen with toxic/metabolic encephalopathies.  The absence of epileptiform discharges does not rule out a clinical diagnosis of epilepsy.  Clinical correlation is advised."  Antibiotics: None  Subjective/Interval History: Patient remains distracted and confused. Not following commands at all times. Does not answer many questions, however. Speech does not appear to be slurred.  ROS: Unable to obtain  Objective:  Vital Signs  Vitals:   03/20/16 0000 03/20/16 0041 03/20/16 0502 03/20/16 1005  BP: 114/76 123/75 104/76 (!) 156/76  Pulse: 102 (!) 103 (!) 102 (!) 111  Resp: 22 20 18 16   Temp:  98.8 F (37.1 C) 98.4 F (36.9 C) 98.9 F (37.2 C)  TempSrc:  Oral Oral Oral  SpO2: 99% 98% 100% 100%  Weight:  55.9 kg (123 lb 4.8 oz)      Intake/Output Summary (Last 24 hours) at 03/20/16 1312 Last data filed at 03/20/16 1028  Gross per 24 hour  Intake              320 ml  Output             1020 ml  Net             -700 ml   Filed Weights   03/20/16 0041  Weight: 55.9 kg (123 lb 4.8 oz)    General appearance: alert, appears stated age, distracted, no distress, slowed  mentation and uncooperative Resp: clear to auscultation bilaterally Cardio: regular rate and rhythm, S1, S2 normal, no murmur, click, rub or gallop GI: soft, non-tender; bowel sounds normal; no masses,  no organomegaly Extremities: extremities normal, atraumatic, no cyanosis or edema Neurologic: Awake, alert. Confused and disoriented. No obvious facial asymmetry. Tongue appears to be midline. Moving all her extremities. No obvious focal deficits are noted.  Lab Results:  Data Reviewed: I have personally reviewed following labs and imaging studies  CBC:  Recent Labs Lab 03/19/16 1948 03/20/16 0259  WBC 13.7* 13.1*  NEUTROABS  --  10.2*  HGB 11.8* 10.8*  HCT 37.1 34.3*  MCV 92.3 91.5  PLT 294 258    Basic Metabolic Panel:  Recent Labs Lab 03/19/16 1948 03/20/16 0259  NA 145 145  K 4.6 3.9  CL 114* 118*  CO2 22 20*  GLUCOSE 94 88  BUN 33* 29*  CREATININE 2.05* 1.65*  CALCIUM 10.4* 10.1    GFR: Estimated Creatinine Clearance: 28.8 mL/min (by C-G formula based on SCr of 1.65 mg/dL (H)).  Liver Function Tests:  Recent Labs Lab 03/19/16 1948 03/20/16 0259  AST 14* 11*  ALT 8* 6*  ALKPHOS 58 50  BILITOT 0.3 0.5  PROT 7.6 6.4*  ALBUMIN 3.4* 3.0*     Recent Labs Lab 03/19/16 2231  AMMONIA 27    Coagulation Profile:  Recent Labs Lab 03/19/16  1948  INR 1.11    Cardiac Enzymes:  Recent Labs Lab 03/20/16 0259 03/20/16 0725  CKTOTAL 89  --   TROPONINI 0.03* 0.03*   CBG:  Recent Labs Lab 03/19/16 1927 03/20/16 0317 03/20/16 0800 03/20/16 1115  GLUCAP 91 85 89 85     Radiology Studies: Dg Pelvis 1-2 Views  Result Date: 03/20/2016 CLINICAL DATA:  Status post fall.  Pain. EXAM: PELVIS - 1-2 VIEW COMPARISON:  None. FINDINGS: Visualized portions of the lumbosacral surgical hardware is grossly intact. The SI joints and visualized portions sacrum are unremarkable. Degenerative changes seen in the hips. No fractures identified. IMPRESSION: No  acute abnormalities. Electronically Signed   By: Gerome Sam III M.D   On: 03/20/2016 09:00   Ct Head Wo Contrast  Result Date: 03/19/2016 CLINICAL DATA:  Shaking, altered mental status EXAM: CT HEAD WITHOUT CONTRAST TECHNIQUE: Contiguous axial images were obtained from the base of the skull through the vertex without intravenous contrast. COMPARISON:  11/13/2015, MRI 10/02/2009 FINDINGS: Brain: The examination is limited by patient motion. No territorial infarction or intracranial hemorrhage is seen. Mild to moderate subcortical and periventricular white matter hypodensities, likely small vessel change. No focal mass, mass effect or midline shift. The ventricles are nonenlarged. Vascular: No hyperdense vessels. Mild carotid artery calcifications. Skull: Mastoid air cells clear.  No fracture. Sinuses/Orbits: No acute finding. Other: None IMPRESSION: 1. Suboptimal study secondary to patient motion. 2. No definite acute intracranial abnormality. Electronically Signed   By: Jasmine Pang M.D.   On: 03/19/2016 21:11   Dg Chest Portable 1 View  Result Date: 03/19/2016 CLINICAL DATA:  Altered mental status. EXAM: PORTABLE CHEST 1 VIEW COMPARISON:  02/19/2016 FINDINGS: A single AP portable view of the chest demonstrates no focal airspace consolidation or alveolar edema. The lungs are grossly clear. There is no large effusion or pneumothorax. Cardiac and mediastinal contours appear unremarkable. IMPRESSION: No active disease. Electronically Signed   By: Ellery Plunk M.D.   On: 03/19/2016 21:40     Medications:  Scheduled: . metoprolol  5 mg Intravenous Q6H  . mometasone-formoterol  2 puff Inhalation BID  . tiotropium  18 mcg Inhalation Daily   Continuous: . sodium chloride Stopped (03/20/16 9604)   VWU:[id number], hydrALAZINE  Assessment/Plan:  Principal Problem:   Acute encephalopathy Active Problems:   Essential hypertension   AKI (acute kidney injury) (HCC)    Acute  encephalopathy Etiology is unclear. Patient has had episodes of agitation and attempts to get out of the bed. CT of the head was done above. However, it was a suboptimal study, which did not show any acute findings. MRI has been attempted twice, however, due to agitation has not been able to complete. We will try again with dose of Haldol. Urine drug screen was positive for benzodiazepine, which is to be expected considering that the patient is on alprazolam at home. No focal neurological deficits are noted on examination. EEG was done which did not show any epileptiform activity. Ammonia level is normal. UA did not suggest infection. Chest x-ray was clear as well. We will obtain B-12, TSH, RPR. Consider thiamine.  Acute renal failure Probably from dehydration and poor oral intake. With hydration renal function has improved. Unfortunately, IV access has been lost. To be seen by speech therapy. Hopefully, she will be cleared to take orally. If not, we may have to place PICC line.  Mildly elevated troponin. Possibly due to renal failure. EKG did not show any ischemic changes. Do not anticipate  any further workup at this time.  History of essential Hypertension Monitor blood pressures closely.   History of COPD Stable. Continue to monitor.   History of back pain and anxiety Holding off medications due to her confusion.  DVT Prophylaxis: Subcutaneous heparin    Code Status: Full code  Family Communication: No family at bedside  Disposition Plan: Await MRI. Await swallow evaluation.    LOS: 0 days   Kindred Hospital Palm BeachesKRISHNAN,Anais Denslow  [facility] Pager [phone] 03/20/2016, 1:12 PM  If 7PM-7AM, please contact night-coverage at [email or web address], password [facility]

## 2016-03-20 NOTE — Progress Notes (Signed)
Received report from ED RN. Room ready . Luismario Coston Joselita, RN 

## 2016-03-20 NOTE — Progress Notes (Signed)
Pt brought down for re-attempt at MRI after giving Haldol.  Pt was restless, trying to crawl out of her bed, not following commands at all.  RN notified, pt sent back up to floor.

## 2016-03-20 NOTE — Progress Notes (Signed)
[facility]  Patient Status: Active (receiving services up to time of hospitalization)  [facility] is providing the following services: RN, MSW and HHA  If patient discharges after hours, please call [phone].   Mackenzie FurnishDonna Davis 03/20/2016, 4:28 PM

## 2016-03-20 NOTE — Progress Notes (Signed)
EEG Completed; Results Pending  

## 2016-03-20 NOTE — Progress Notes (Signed)
New Admission Note:   Ar[facility] Method[facility] Mental Orientation:Responds to voice                           Telemetry: Box #10 Assessment: Completed Skin: See docflowsheet IV: Lt FA Pain: Denies Tubes: N/A Safety Measures: Safety Fall Prevention Plan has been given, discussed with family. Admission: Completed [address] Orientation:  Family: at bedside  Orders have been reviewed and implemented. Will continue to monitor the patient. Call light has been placed within reach and bed alarm has been activated.   Toll BrothersCharito Ayaan Ringle BSN, RN-BC Phone number: [phone]

## 2016-03-20 NOTE — Procedures (Signed)
ELECTROENCEPHALOGRAM REPORT  Date of Study: 03/20/2016  Patient's Name: [name] MRN: [id number] Date of Birth: [birth year]  Referring Provider: Midge MiniumArshad Kakrakandy, MD  Clinical History: 70 year old woman, positive for opioids, with altered mental status.  Medications: Hospital Medications  0.9 % sodium chloride infusion   albuterol (PROVENTIL) (2.5 MG/3ML) 0.083% nebulizer solution 3 mL   hydrALAZINE (APRESOLINE) injection 10 mg   metoprolol (LOPRESSOR) injection 5 mg   mometasone-formoterol (DULERA) 200-5 MCG/ACT inhaler 2 puff   tiotropium (SPIRIVA) inhalation capsule 18 mcg  Outpatient Medications  albuterol (PROVENTIL HFA;VENTOLIN HFA) 108 (90 BASE) MCG/ACT inhaler   ALPRAZolam (XANAX) 0.25 MG tablet   cetirizine (ZYRTEC) 10 MG tablet   diclofenac sodium (VOLTAREN) 1 % GEL   diltiazem (CARDIZEM CD) 240 MG 24 hr capsule   Fluticasone-Salmeterol (ADVAIR DISKUS) 250-50 MCG/DOSE AEPB   furosemide (LASIX) 40 MG tablet   gabapentin (NEURONTIN) 300 MG capsule   losartan (COZAAR) 50 MG tablet   metoprolol (LOPRESSOR) 50 MG tablet   montelukast (SINGULAIR) 10 MG tablet   oxyCODONE-acetaminophen (PERCOCET) 7.5-325 MG tablet   rosuvastatin (CRESTOR) 10 MG tablet   sennosides-docusate sodium (SENOKOT-S) 8.6-50 MG tablet   tiotropium (SPIRIVA) 18 MCG inhalation capsule   Technical Summary: A multichannel digital EEG recording measured by the international 10-20 system with electrodes applied with paste and impedances below 5000 ohms performed as portable with EKG monitoring in an awake patient.  Hyperventilation and photic stimulation were not performed.  The digital EEG was referentially recorded, reformatted, and digitally filtered in a variety of bipolar and referential montages for optimal display.   Description: The patient is awake during the recording.  There is disorganization of the background with diffuse 5-6 Hz theta and 2-3 Hz delta slowing of the waking background,  triphasic waves and no discernible posterior dominant rhythm.  Vertex waves and symmetric sleep spindles were not seen.  There were no epileptiform discharges or electrographic seizures seen.    EKG lead was unremarkable.  Impression: This awake EEG is abnormal due to diffuse slowing and presence of triphasic waves of the waking background.  Clinical Correlation of the above findings indicates diffuse cerebral dysfunction that is non-specific in etiology, but triphasic waves and can be seen with toxic/metabolic encephalopathies.  The absence of epileptiform discharges does not rule out a clinical diagnosis of epilepsy.  Clinical correlation is advised.  Shon MilletAdam Jaffe, DO

## 2016-03-20 NOTE — H&P (Signed)
History and Physical    Mackenzie Haileyoni A Kocak ZOX:[id number] DOB: Sep 06, 1945 DOA: 03/19/2016  PCP: Rinaldo CloudHarwani, Mohan, MD  Patient coming from: Home.  Patient's history obtained from ER physician and patient's daughter. Patient is confused.  Chief Complaint: Altered mental status.  HPI: [name] is a 70 y.o. female with hypertension, COPD, ongoing tobacco abuse, low back pain and anxiety was brought to the ER after patient was found to be confused by patient's family. Patient lives alone. On Tuesday October 31 patient was found to be confused by patient's family but thought that it may be transient. Later on Thursday yesterday patient again was found to be very confused and not making any sense and was brought to the ER. Not sure if patient had taken more of her regular pain medications. In the ER urine drug screen is positive for opiates but negative for benzodiazepine which is in her medication list. CT of the head was unremarkable patient is afebrile. On exam patient is only oriented to her name. Does not follow commands but moves all extremities. Pupils are reacting to light. Patient is being admitted for further observation. As per family patient does not drink alcohol. Patient medications include narcotics and benzodiazepine. Patient has had recent low back surgery in August.  ED Course: Narcan was given with not much effect. CT head is unremarkable. Venous blood gas does not show any carbon dioxide retention.  Review of Systems: As per HPI, rest all negative.   Past Medical History:  Diagnosis Date  . ALLERGIC RHINITIS   . Arthritis   . ASTHMA, UNSPECIFIED, UNSPECIFIED STATUS   . DYSLIPIDEMIA   . Headache(784.0)   . HYPERTENSION   . Hypertension   . Mild renal insufficiency   . PNA (pneumonia)   . Shortness of breath dyspnea   . SPINAL STENOSIS, LUMBAR    MRI 12/2005; surg 2004  . TOBACCO ABUSE     Past Surgical History:  Procedure Laterality Date  . ANTERIOR LAT LUMBAR  FUSION Right 12/30/2015   Procedure: Lumbar two-three, Lumbar three-four Anterior Lateral Lumbar Interbody Fusion with Anterior column realignment at Lumbar three-four;  Surgeon: Loura HaltBenjamin Jared Ditty, MD;  Location: [facility];  Service: Neurosurgery;  Laterality: Right;  . Closure of bronchopleural fistula, right lower lobe.    [name]. COLONOSCOPY    . Decompressive Laminectomy     Fusion L4-5, L5-S1 (Cram-2004)  . Decortication of right lower lobe    . Placement of wound On-Q analgesia irrigation system.    [name]. POSTERIOR LUMBAR FUSION 4 LEVEL N/A 12/30/2015   Procedure: Lumbar two -Sacral one Dorsal Internal Fixation and Fusion, [name] osteotomies Lumbar two-three, Lumbar three-four;  Surgeon: Loura HaltBenjamin Jared Ditty, MD;  Location: [facility];  Service: Neurosurgery;  Laterality: N/A;  . Right hand  1995  . Right knee  1990  . Right video-assisted thoracoscopic surgery, drainage of empyema.  02/12/2011     reports that she has been smoking Cigarettes.  She has a 10.00 pack-year smoking history. She has never used smokeless tobacco. She reports that she does not drink alcohol or use drugs.  Allergies  Allergen Reactions  . Shrimp [Shellfish Allergy] Hives and Swelling    Family History  Problem Relation Age of Onset  . Mental illness Mother   . Hypertension Mother   . Arthritis Other   . Hypertension Other     Prior to Admission medications   Medication Sig Start Date End Date Taking? Authorizing Provider  albuterol (  PROVENTIL HFA;VENTOLIN HFA) 108 (90 BASE) MCG/ACT inhaler Inhale 2 puffs into the lungs every 6 (six) hours as needed for wheezing or shortness of breath.     Historical Provider, MD  ALPRAZolam Prudy Feeler) 0.25 MG tablet Take 0.25 mg by mouth at bedtime as needed for anxiety.    Historical Provider, MD  cetirizine (ZYRTEC) 10 MG tablet Take 10 mg by mouth daily.    Historical Provider, MD  diclofenac sodium (VOLTAREN) 1 % GEL Apply 1 application topically 3 (three) times  daily. Feet and knees 02/28/16   Ranelle Oyster, MD  diltiazem (CARDIZEM CD) 240 MG 24 hr capsule Take 240 mg by mouth daily.     Historical Provider, MD  Fluticasone-Salmeterol (ADVAIR DISKUS) 250-50 MCG/DOSE AEPB Inhale 1 puff into the lungs 2 (two) times daily.      Historical Provider, MD  furosemide (LASIX) 40 MG tablet Take 40 mg by mouth.    Historical Provider, MD  gabapentin (NEURONTIN) 300 MG capsule Take 2 capsules (600 mg total) by mouth 3 (three) times daily. 01/13/16   Evlyn Kanner Love, PA-C  losartan (COZAAR) 50 MG tablet Take 1 tablet (50 mg total) by mouth daily. 02/26/16   Ranelle Oyster, MD  metoprolol (LOPRESSOR) 50 MG tablet Take 50 mg by mouth 2 (two) times daily.    Historical Provider, MD  montelukast (SINGULAIR) 10 MG tablet Take 10 mg by mouth at bedtime.    Historical Provider, MD  oxyCODONE-acetaminophen (PERCOCET) 7.5-325 MG tablet Take 1 tablet by mouth every 6 (six) hours as needed for severe pain.    Historical Provider, MD  rosuvastatin (CRESTOR) 10 MG tablet Take 10 mg by mouth daily.     Historical Provider, MD  sennosides-docusate sodium (SENOKOT-S) 8.6-50 MG tablet Take 2 tablets by mouth 2 (two) times daily.    Historical Provider, MD  tiotropium (SPIRIVA) 18 MCG inhalation capsule Place 18 mcg into inhaler and inhale daily.      Historical Provider, MD    Physical Exam: Vitals:   03/19/16 2300 03/19/16 2315 03/20/16 0000 03/20/16 0041  BP: 114/99 121/84 114/76 123/75  Pulse: 115 109 102 (!) 103  Resp: 23 19 22 20   Temp:    98.8 F (37.1 C)  TempSrc:    Oral  SpO2: 99% 100% 99% 98%  Weight:    55.9 kg (123 lb 4.8 oz)      Constitutional: Moderately built and nourished. Vitals:   03/19/16 2300 03/19/16 2315 03/20/16 0000 03/20/16 0041  BP: 114/99 121/84 114/76 123/75  Pulse: 115 109 102 (!) 103  Resp: 23 19 22 20   Temp:    98.8 F (37.1 C)  TempSrc:    Oral  SpO2: 99% 100% 99% 98%  Weight:    55.9 kg (123 lb 4.8 oz)   Eyes: Anicteric. No  pallor. ENMT: No discharge from the ears eyes nose or mouth. Neck: No mass felt. No neck rigidity. Respiratory: No rhonchi or crepitations. Cardiovascular: S1 and S2 heard. No murmurs. Abdomen: Soft nontender bowel sounds present. No guarding or rigidity. Musculoskeletal: No edema. No joint effusion. Skin: No rash. Skin appears warm. Neurologic: Patient appears lethargic and oriented to name only. Does not follow commands. Psychiatric: Patient is confused.   Labs on Admission: I have personally reviewed following labs and imaging studies  CBC:  Recent Labs Lab 03/19/16 1948  WBC 13.7*  HGB 11.8*  HCT 37.1  MCV 92.3  PLT 294   Basic Metabolic Panel:  Recent Labs  Lab 03/19/16 1948  NA 145  K 4.6  CL 114*  CO2 22  GLUCOSE 94  BUN 33*  CREATININE 2.05*  CALCIUM 10.4*   GFR: Estimated Creatinine Clearance: 23.2 mL/min (by C-G formula based on SCr of 2.05 mg/dL (H)). Liver Function Tests:  Recent Labs Lab 03/19/16 1948  AST 14*  ALT 8*  ALKPHOS 58  BILITOT 0.3  PROT 7.6  ALBUMIN 3.4*   No results for input(s): LIPASE, AMYLASE in the last 168 hours.  Recent Labs Lab 03/19/16 2231  AMMONIA 27   Coagulation Profile:  Recent Labs Lab 03/19/16 1948  INR 1.11   Cardiac Enzymes: No results for input(s): CKTOTAL, CKMB, CKMBINDEX, TROPONINI in the last 168 hours. BNP (last 3 results) No results for input(s): PROBNP in the last 8760 hours. HbA1C: No results for input(s): HGBA1C in the last 72 hours. CBG:  Recent Labs Lab 03/19/16 1927  GLUCAP 91   Lipid Profile: No results for input(s): CHOL, HDL, LDLCALC, TRIG, CHOLHDL, LDLDIRECT in the last 72 hours. Thyroid Function Tests: No results for input(s): TSH, T4TOTAL, FREET4, T3FREE, THYROIDAB in the last 72 hours. Anemia Panel: No results for input(s): VITAMINB12, FOLATE, FERRITIN, TIBC, IRON, RETICCTPCT in the last 72 hours. Urine analysis:    Component Value Date/Time   COLORURINE YELLOW  03/19/2016 2350   APPEARANCEUR CLEAR 03/19/2016 2350   LABSPEC 1.010 03/19/2016 2350   PHURINE 5.0 03/19/2016 2350   GLUCOSEU NEGATIVE 03/19/2016 2350   HGBUR NEGATIVE 03/19/2016 2350   BILIRUBINUR NEGATIVE 03/19/2016 2350   KETONESUR NEGATIVE 03/19/2016 2350   PROTEINUR NEGATIVE 03/19/2016 2350   UROBILINOGEN 0.2 09/21/2014 1051   NITRITE NEGATIVE 03/19/2016 2350   LEUKOCYTESUR NEGATIVE 03/19/2016 2350   Sepsis Labs: @LABRCNTIP (procalcitonin:4,lacticidven:4) )No results found for this or any previous visit (from the past 240 hour(s)).   Radiological Exams on Admission: Ct Head Wo Contrast  Result Date: 03/19/2016 CLINICAL DATA:  Shaking, altered mental status EXAM: CT HEAD WITHOUT CONTRAST TECHNIQUE: Contiguous axial images were obtained from the base of the skull through the vertex without intravenous contrast. COMPARISON:  11/13/2015, MRI 10/02/2009 FINDINGS: Brain: The examination is limited by patient motion. No territorial infarction or intracranial hemorrhage is seen. Mild to moderate subcortical and periventricular white matter hypodensities, likely small vessel change. No focal mass, mass effect or midline shift. The ventricles are nonenlarged. Vascular: No hyperdense vessels. Mild carotid artery calcifications. Skull: Mastoid air cells clear.  No fracture. Sinuses/Orbits: No acute finding. Other: None IMPRESSION: 1. Suboptimal study secondary to patient motion. 2. No definite acute intracranial abnormality. Electronically Signed   By: Jasmine Pang M.D.   On: 03/19/2016 21:11   Dg Chest Portable 1 View  Result Date: 03/19/2016 CLINICAL DATA:  Altered mental status. EXAM: PORTABLE CHEST 1 VIEW COMPARISON:  02/19/2016 FINDINGS: A single AP portable view of the chest demonstrates no focal airspace consolidation or alveolar edema. The lungs are grossly clear. There is no large effusion or pneumothorax. Cardiac and mediastinal contours appear unremarkable. IMPRESSION: No active disease.  Electronically Signed   By: Ellery Plunk M.D.   On: 03/19/2016 21:40     Assessment/Plan Principal Problem:   Acute encephalopathy Active Problems:   Essential hypertension   AKI (acute kidney injury) (HCC)    1. Acute encephalopathy - cause not clear. Patient is afebrile. Most likely reason could be either medication withdrawal or increased dosage. Salicylates are within normal acceptable limits. I have repeat salicylate levels to see if it is rising. MRI brain EEG  has been ordered. Ammonia levels are normal. Closely observe. Patient nothing by mouth for now. Continue to hydrate. 2. Acute renal failure probably from dehydration and poor oral intake - continue to hydrate and follow metabolic panel. Probably contributing to #1 with decreased renal excretion of drugs. 3. Hypertension - since patient is nothing by mouth I have placed patient on scheduled dose of metoprolol with when necessary IV hydralazine. 4. COPD - presently not wheezing continue inhalers. 5. History of back pain and anxiety - holding off medications due to #1.   Discussed with Dr. Algie CofferKadakia, on call for Dr. Sharyn LullHarwani. May take over patient's care in the morning.   DVT prophylaxis: SCDs. Code Status: Full code.  Family Communication: Discussed with patient's daughter.  Disposition Plan: To be determined.  Consults called: None.  Admission status: Observation.    Eduard ClosKAKRAKANDY,ARSHAD N. MD [facility] Pager [phone].  If 7PM-7AM, please contact night-coverage [email or web address] Password [email or web address]  03/20/2016, 2:36 AM

## 2016-03-21 ENCOUNTER — Inpatient Hospital Stay (HOSPITAL_COMMUNITY): Payer: Medicare Other

## 2016-03-21 DIAGNOSIS — N179 Acute kidney failure, unspecified: Secondary | ICD-10-CM

## 2016-03-21 DIAGNOSIS — R748 Abnormal levels of other serum enzymes: Secondary | ICD-10-CM

## 2016-03-21 DIAGNOSIS — I10 Essential (primary) hypertension: Secondary | ICD-10-CM

## 2016-03-21 LAB — GLUCOSE, CAPILLARY
GLUCOSE-CAPILLARY: 82 mg/dL (ref 65–99)
GLUCOSE-CAPILLARY: 90 mg/dL (ref 65–99)
GLUCOSE-CAPILLARY: 95 mg/dL (ref 65–99)
GLUCOSE-CAPILLARY: 96 mg/dL (ref 65–99)
Glucose-Capillary: 87 mg/dL (ref 65–99)
Glucose-Capillary: 101 mg/dL — ABNORMAL HIGH (ref 65–99)

## 2016-03-21 LAB — HIV ANTIBODY (ROUTINE TESTING W REFLEX): HIV Screen 4th Generation wRfx: NONREACTIVE

## 2016-03-21 LAB — COMPREHENSIVE METABOLIC PANEL
ALBUMIN: 3.4 g/dL — AB (ref 3.5–5.0)
ALK PHOS: 57 U/L (ref 38–126)
ALT: 9 U/L — AB (ref 14–54)
ANION GAP: 8 (ref 5–15)
AST: 16 U/L (ref 15–41)
BILIRUBIN TOTAL: 0.5 mg/dL (ref 0.3–1.2)
BUN: 22 mg/dL — ABNORMAL HIGH (ref 6–20)
CALCIUM: 10.8 mg/dL — AB (ref 8.9–10.3)
CO2: 23 mmol/L (ref 22–32)
CREATININE: 1.23 mg/dL — AB (ref 0.44–1.00)
Chloride: 115 mmol/L — ABNORMAL HIGH (ref 101–111)
GFR calc Af Amer: 51 mL/min — ABNORMAL LOW (ref >60)
GFR calc non Af Amer: 44 mL/min — ABNORMAL LOW (ref >60)
GLUCOSE: 80 mg/dL (ref 65–99)
Potassium: 3.7 mmol/L (ref 3.5–5.1)
Sodium: 146 mmol/L — ABNORMAL HIGH (ref 135–145)
TOTAL PROTEIN: 7.8 g/dL (ref 6.5–8.1)

## 2016-03-21 LAB — IRON AND TIBC
IRON: 35 ug/dL (ref 28–170)
Saturation Ratios: 8 % — ABNORMAL LOW (ref 10.4–31.8)
TIBC: 430 ug/dL (ref 250–450)
UIBC: 395 ug/dL

## 2016-03-21 LAB — CBC
HEMATOCRIT: 37.4 % (ref 36.0–46.0)
HEMOGLOBIN: 11.9 g/dL — AB (ref 12.0–15.0)
MCH: 28.9 pg (ref 26.0–34.0)
MCHC: 31.8 g/dL (ref 30.0–36.0)
MCV: 90.8 fL (ref 78.0–100.0)
Platelets: 279 10*3/uL (ref 150–400)
RBC: 4.12 MIL/uL (ref 3.87–5.11)
RDW: 14 % (ref 11.5–15.5)
WBC: 9.2 10*3/uL (ref 4.0–10.5)

## 2016-03-21 LAB — TSH: TSH: 0.776 u[IU]/mL (ref 0.350–4.500)

## 2016-03-21 LAB — RETICULOCYTES
RBC.: 4.12 MIL/uL (ref 3.87–5.11)
RETIC COUNT ABSOLUTE: 61.8 10*3/uL (ref 19.0–186.0)
RETIC CT PCT: 1.5 % (ref 0.4–3.1)

## 2016-03-21 LAB — FOLATE: Folate: 9.7 ng/mL (ref >5.9)

## 2016-03-21 LAB — VITAMIN B12: Vitamin B-12: 416 pg/mL (ref 180–914)

## 2016-03-21 LAB — FERRITIN: FERRITIN: 20 ng/mL (ref 11–307)

## 2016-03-21 LAB — RPR: RPR Ser Ql: NONREACTIVE

## 2016-03-21 MED ORDER — HALOPERIDOL LACTATE 5 MG/ML IJ SOLN
1.0000 mg | Freq: Once | INTRAMUSCULAR | Status: AC
  Administered 2016-03-21: 1 mg via INTRAMUSCULAR
  Filled 2016-03-21: qty 1

## 2016-03-21 MED ORDER — LORAZEPAM 2 MG/ML IJ SOLN
INTRAMUSCULAR | Status: AC
  Administered 2016-03-21: 1 mg
  Filled 2016-03-21: qty 1

## 2016-03-21 NOTE — Progress Notes (Addendum)
[facility] PROGRESS NOTE  [name] SAY:[id number] DOB: 28-Oct-1945 DOA: 03/19/2016  PCP: Rinaldo CloudHarwani, Mohan, MD  Brief History/Interval Summary: 70 year old African-American female with a past medical history of hypertension, COPD, history of back pain and anxiety disorder, was brought into the emergency department after patient was found to be confused by her family members. Patient apparently lives alone. She was hospitalized for further management.  Reason for Visit: Acute encephalopathy  Consultants: None yet  Procedures:  EEG "Impression: This awake EEG is abnormal due to diffuse slowing and presence of triphasic waves of the waking background.  Clinical Correlation of the above findings indicates diffuse cerebral dysfunction that is non-specific in etiology, but triphasic waves and can be seen with toxic/metabolic encephalopathies.  The absence of epileptiform discharges does not rule out a clinical diagnosis of epilepsy.  Clinical correlation is advised."  Antibiotics: None  Subjective/Interval History: Patient remains distracted and confused. It appears to be following commands today. More than yesterday. [facility]   ROS: Unable to obtain  Objective:  Vital Signs  Vitals:   03/20/16 2118 03/21/16 0442 03/21/16 0748 03/21/16 0903  BP: 122/78 130/84  (!) 153/83  Pulse: 68 95  100  Resp: 16 16  18   Temp: 98.9 F (37.2 C) 99.5 F (37.5 C)  98.7 F (37.1 C)  TempSrc: Oral Oral  Oral  SpO2: 100% 100% 100% 100%  Weight: 55.9 kg (123 lb 3.8 oz)       Intake/Output Summary (Last 24 hours) at 03/21/16 0932 Last data filed at 03/21/16 0618  Gross per 24 hour  Intake              240 ml  Output              600 ml  Net             -360 ml   Filed Weights   03/20/16 0041 03/20/16 2118  Weight: 55.9 kg (123 lb 4.8 oz) 55.9 kg (123 lb 3.8 oz)    General appearance: alert, appears stated age, distracted, no  distress. Able to better follow commands today compared to yesterday. Resp: clear to auscultation bilaterally Cardio: regular rate and rhythm, S1, S2 normal, no murmur, click, rub or gallop GI: soft, non-tender; bowel sounds normal; no masses,  no organomegaly Extremities: extremities normal, atraumatic, no cyanosis or edema Neurologic: Awake, alert. Confused and disoriented. Oriented only to place today, which is better than yesterday. No obvious facial asymmetry. Tongue appears to be midline. Moving all her extremities. No obvious focal deficits are noted.  Lab Results:  Data Reviewed: I have personally reviewed following labs and imaging studies  CBC:  Recent Labs Lab 03/19/16 1948 03/20/16 0259 03/21/16 0608  WBC 13.7* 13.1* 9.2  NEUTROABS  --  10.2*  --   HGB 11.8* 10.8* 11.9*  HCT 37.1 34.3* 37.4  MCV 92.3 91.5 90.8  PLT 294 258 279    Basic Metabolic Panel:  Recent Labs Lab 03/19/16 1948 03/20/16 0259 03/21/16 0608  NA 145 145 146*  K 4.6 3.9 3.7  CL 114* 118* 115*  CO2 22 20* 23  GLUCOSE 94 88 80  BUN 33* 29* 22*  CREATININE 2.05* 1.65* 1.23*  CALCIUM 10.4* 10.1 10.8*    GFR: Estimated Creatinine Clearance: 38.6 mL/min (by C-G formula based on SCr of 1.23 mg/dL (H)).  Liver Function Tests:  Recent Labs Lab 03/19/16 1948 03/20/16 0259 03/21/16 [id number]  AST 14* 11* 16  ALT 8* 6* 9*  ALKPHOS 58 50 57  BILITOT 0.3 0.5 0.5  PROT 7.6 6.4* 7.8  ALBUMIN 3.4* 3.0* 3.4*     Recent Labs Lab 03/19/16 2231  AMMONIA 27    Coagulation Profile:  Recent Labs Lab 03/19/16 1948  INR 1.11    Cardiac Enzymes:  Recent Labs Lab 03/20/16 0259 03/20/16 0725 03/20/16 1701  CKTOTAL 89  --   --   TROPONINI 0.03* 0.03* 0.13*   CBG:  Recent Labs Lab 03/20/16 1626 03/20/16 2027 03/21/16 0022 03/21/16 0438 03/21/16 0749  GLUCAP 90 79 90 87 101*     Radiology Studies: Dg Pelvis 1-2 Views  Result Date: 03/20/2016 CLINICAL DATA:  Status post  fall.  Pain. EXAM: PELVIS - 1-2 VIEW COMPARISON:  None. FINDINGS: Visualized portions of the lumbosacral surgical hardware is grossly intact. The SI joints and visualized portions sacrum are unremarkable. Degenerative changes seen in the hips. No fractures identified. IMPRESSION: No acute abnormalities. Electronically Signed   By: Gerome Samavid  Williams III M.D   On: 03/20/2016 09:00   Ct Head Wo Contrast  Result Date: 03/19/2016 CLINICAL DATA:  Shaking, altered mental status EXAM: CT HEAD WITHOUT CONTRAST TECHNIQUE: Contiguous axial images were obtained from the base of the skull through the vertex without intravenous contrast. COMPARISON:  11/13/2015, MRI 10/02/2009 FINDINGS: Brain: The examination is limited by patient motion. No territorial infarction or intracranial hemorrhage is seen. Mild to moderate subcortical and periventricular white matter hypodensities, likely small vessel change. No focal mass, mass effect or midline shift. The ventricles are nonenlarged. Vascular: No hyperdense vessels. Mild carotid artery calcifications. Skull: Mastoid air cells clear.  No fracture. Sinuses/Orbits: No acute finding. Other: None IMPRESSION: 1. Suboptimal study secondary to patient motion. 2. No definite acute intracranial abnormality. Electronically Signed   By: Jasmine PangKim  Fujinaga M.D.   On: 03/19/2016 21:11   Dg Chest Portable 1 View  Result Date: 03/19/2016 CLINICAL DATA:  Altered mental status. EXAM: PORTABLE CHEST 1 VIEW COMPARISON:  02/19/2016 FINDINGS: A single AP portable view of the chest demonstrates no focal airspace consolidation or alveolar edema. The lungs are grossly clear. There is no large effusion or pneumothorax. Cardiac and mediastinal contours appear unremarkable. IMPRESSION: No active disease. Electronically Signed   By: Ellery Plunkaniel R Mitchell M.D.   On: 03/19/2016 21:40     Medications:  Scheduled: . haloperidol lactate  1 mg Intramuscular Once  . heparin subcutaneous  5,000 Units Subcutaneous Q8H    . metoprolol tartrate  25 mg Oral BID  . mometasone-formoterol  2 puff Inhalation BID  . thiamine  100 mg Oral Daily  . tiotropium  18 mcg Inhalation Daily   Continuous:   ZOX:[id number]:albuterol, haloperidol, hydrALAZINE  Assessment/Plan:  Principal Problem:   Acute encephalopathy Active Problems:   Essential hypertension   AKI (acute kidney injury) (HCC)   Altered mental status    Acute encephalopathy Etiology is unclear. Dental status appears to have slightly improved although she still tries to get out of bed and requires constant supervision. CT of the head was done above. However, it was a suboptimal study, which did not show any acute findings. MRI has been attempted twice yesterday, however, due to agitation has not been able to complete. Plan is to try again today. Urine drug screen was positive only for benzodiazepine, which is to be expected considering that the patient is on alprazolam at home. No focal neurological deficits are noted on examination. EEG was done which  did not show any epileptiform activity. Ammonia level is normal. UA did not suggest infection. Chest x-ray was clear as well. We will obtain B-12, TSH, RPR. Continue thiamine. TSH is normal.  Acute renal failure Probably from dehydration and poor oral intake. With hydration renal function has improved. Hydrate orally.  Mildly elevated troponin. Possibly due to renal failure. EKG did not show any ischemic changes. Will get echocardiogram.  Mild hypercalcemia Could be due to hypovolemia and dehydration. The level is not high enough to cause her mental confusion. Continue to recheck. Urine did not show any protein. May need further workup if remains persistently elevated despite hydration.  History of essential Hypertension Monitor blood pressures closely.   History of COPD Stable. Continue to monitor.   History of back pain and anxiety Holding off medications due to her confusion. Watch for symptoms of  withdrawal.  DVT Prophylaxis: Subcutaneous heparin    Code Status: Full code  Family Communication: Discussed with her daughter Disposition Plan: Slightly improved today. Await MRI. Continue to monitor closely.    LOS: 1 day   [facility]  [facility] Pager [phone] 03/21/2016, 9:32 AM  If 7PM-7AM, please contact night-coverage at [email or web address], password [email or web address]

## 2016-03-21 NOTE — Consult Note (Signed)
Reason for Consult: Altered mental status Referring Physician: Internal medicine hospitalist  [name] is an 70 y.o. female.  HPI: 3-4 day history of confusion, disorientation, and language disturbances.  Upon arrival CT Brain was negative.  She was in renal failure with a GFR of 28 which has since improved with hydration to 51. UDS was positive for Benzo and she was taking Neurontin 600 mg tid at home too.  All other labs normal.  Although she has improved she was still confused, therefore MRI Brain was done which was suboptimal due to motion.  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    Past Medical History:  Diagnosis Date  . ALLERGIC RHINITIS   . Arthritis   . ASTHMA, UNSPECIFIED, UNSPECIFIED STATUS   . DYSLIPIDEMIA   . Headache(784.0)   . HYPERTENSION   . Hypertension   . Mild renal insufficiency   . PNA (pneumonia)   . Shortness of breath dyspnea   . SPINAL STENOSIS, LUMBAR    MRI 12/2005; surg 2004  . TOBACCO ABUSE     Past Surgical History:  Procedure Laterality Date  . ANTERIOR LAT LUMBAR FUSION Right 12/30/2015   Procedure: Lumbar two-three, Lumbar three-four Anterior Lateral Lumbar Interbody Fusion with Anterior column realignment at Lumbar three-four;  Surgeon: Kevan Ny Ditty, MD;  Location: [place] [facility];  Service: Neurosurgery;  Laterality: Right;  . Closure of bronchopleural fistula, right lower lobe.    [name] COLONOSCOPY    . Decompressive Laminectomy     Fusion L4-5, L5-S1 ([facility]-2004)  . Decortication of right lower lobe    . Placement of wound On-Q analgesia irrigation system.    [name] POSTERIOR LUMBAR FUSION 4 LEVEL N/A 12/30/2015   Procedure: Lumbar two -Sacral one Dorsal Internal Fixation and Fusion, Charline Bills osteotomies Lumbar two-three, Lumbar three-four;  Surgeon: Kevan Ny Ditty, MD;  Location: [place] [facility];  Service: Neurosurgery;  Laterality: N/A;  . Right hand  1995  . Right knee  1990   . Right video-assisted thoracoscopic surgery, drainage of empyema.  02/12/2011    Family History  Problem Relation Age of Onset  . Mental illness Mother   . Hypertension Mother   . Arthritis Other   . Hypertension Other     Social History:  reports that she has been smoking Cigarettes.  She has a 10.00 pack-year smoking history. She has never used smokeless tobacco. She reports that she does not drink alcohol or use drugs.  Allergies:  Allergies  Allergen Reactions  . Shrimp [Shellfish Allergy] Hives and Swelling    Prior to Admission medications   Medication Sig Start Date End Date Taking? Authorizing Provider  albuterol (PROVENTIL HFA;VENTOLIN HFA) 108 (90 BASE) MCG/ACT inhaler Inhale 2 puffs into the lungs every 6 (six) hours as needed for wheezing or shortness of breath.    Yes Historical Provider, MD  ALPRAZolam Duanne Moron) 0.25 MG tablet Take 0.25 mg by mouth at bedtime as needed for anxiety.   Yes Historical Provider, MD  cetirizine (ZYRTEC) 10 MG tablet Take 10 mg by mouth daily.   Yes Historical Provider, MD  diclofenac sodium (VOLTAREN) 1 % GEL Apply 1 application topically 3 (three) times daily. Feet and knees 02/28/16  Yes Meredith Staggers, MD  diltiazem (CARDIZEM CD) 240 MG 24 hr capsule Take 240 mg by mouth daily.    Yes Historical Provider, MD  Fluticasone-Salmeterol (ADVAIR DISKUS) 250-50 MCG/DOSE AEPB Inhale 1 puff into the lungs 2 (  two) times daily.     Yes Historical Provider, MD  furosemide (LASIX) 40 MG tablet Take 40 mg by mouth.   Yes Historical Provider, MD  gabapentin (NEURONTIN) 300 MG capsule Take 2 capsules (600 mg total) by mouth 3 (three) times daily. 01/13/16  Yes Ivan Anchors Love, PA-C  losartan (COZAAR) 50 MG tablet Take 1 tablet (50 mg total) by mouth daily. 02/26/16  Yes Meredith Staggers, MD  metoprolol (LOPRESSOR) 50 MG tablet Take 50 mg by mouth 2 (two) times daily.   Yes Historical Provider, MD  montelukast (SINGULAIR) 10 MG tablet Take 10 mg by mouth at  bedtime.   Yes Historical Provider, MD  oxyCODONE-acetaminophen (PERCOCET) 7.5-325 MG tablet Take 1 tablet by mouth every 6 (six) hours as needed for severe pain.   Yes Historical Provider, MD  rosuvastatin (CRESTOR) 10 MG tablet Take 10 mg by mouth daily.    Yes Historical Provider, MD  sennosides-docusate sodium (SENOKOT-S) 8.6-50 MG tablet Take 2 tablets by mouth 2 (two) times daily.   Yes Historical Provider, MD  tiotropium (SPIRIVA) 18 MCG inhalation capsule Place 18 mcg into inhaler and inhale daily.     Yes Historical Provider, MD    Medications:  Prior to Admission:  Prescriptions Prior to Admission  Medication Sig Dispense Refill Last Dose  . albuterol (PROVENTIL HFA;VENTOLIN HFA) 108 (90 BASE) MCG/ACT inhaler Inhale 2 puffs into the lungs every 6 (six) hours as needed for wheezing or shortness of breath.    Taking  . ALPRAZolam (XANAX) 0.25 MG tablet Take 0.25 mg by mouth at bedtime as needed for anxiety.   Taking  . cetirizine (ZYRTEC) 10 MG tablet Take 10 mg by mouth daily.   Taking  . diclofenac sodium (VOLTAREN) 1 % GEL Apply 1 application topically 3 (three) times daily. Feet and knees 3 Tube 4   . diltiazem (CARDIZEM CD) 240 MG 24 hr capsule Take 240 mg by mouth daily.    Unknown  . Fluticasone-Salmeterol (ADVAIR DISKUS) 250-50 MCG/DOSE AEPB Inhale 1 puff into the lungs 2 (two) times daily.     Taking  . furosemide (LASIX) 40 MG tablet Take 40 mg by mouth.     . gabapentin (NEURONTIN) 300 MG capsule Take 2 capsules (600 mg total) by mouth 3 (three) times daily.   Taking  . losartan (COZAAR) 50 MG tablet Take 1 tablet (50 mg total) by mouth daily. 30 tablet 3   . metoprolol (LOPRESSOR) 50 MG tablet Take 50 mg by mouth 2 (two) times daily.     . montelukast (SINGULAIR) 10 MG tablet Take 10 mg by mouth at bedtime.   Taking  . oxyCODONE-acetaminophen (PERCOCET) 7.5-325 MG tablet Take 1 tablet by mouth every 6 (six) hours as needed for severe pain.   Taking  . rosuvastatin (CRESTOR)  10 MG tablet Take 10 mg by mouth daily.    Taking  . sennosides-docusate sodium (SENOKOT-S) 8.6-50 MG tablet Take 2 tablets by mouth 2 (two) times daily.   Past Week at Unknown time  . tiotropium (SPIRIVA) 18 MCG inhalation capsule Place 18 mcg into inhaler and inhale daily.     Taking    Results for orders placed or performed during the hospital encounter of 03/19/16 (from the past 48 hour(s))  CBG monitoring, ED     Status: None   Collection Time: 03/19/16  7:27 PM  Result Value Ref Range   Glucose-Capillary 91 65 - 99 mg/dL  Comprehensive metabolic panel  Status: Abnormal   Collection Time: 03/19/16  7:48 PM  Result Value Ref Range   Sodium 145 135 - 145 mmol/L   Potassium 4.6 3.5 - 5.1 mmol/L   Chloride 114 (H) 101 - 111 mmol/L   CO2 22 22 - 32 mmol/L   Glucose, Bld 94 65 - 99 mg/dL   BUN 33 (H) 6 - 20 mg/dL   Creatinine, Ser 2.05 (H) 0.44 - 1.00 mg/dL   Calcium 10.4 (H) 8.9 - 10.3 mg/dL   Total Protein 7.6 6.5 - 8.1 g/dL   Albumin 3.4 (L) 3.5 - 5.0 g/dL   AST 14 (L) 15 - 41 U/L   ALT 8 (L) 14 - 54 U/L   Alkaline Phosphatase 58 38 - 126 U/L   Total Bilirubin 0.3 0.3 - 1.2 mg/dL   GFR calc non Af Amer 24 (L) >60 mL/min   GFR calc Af Amer 28 (L) >60 mL/min    Comment: (NOTE) The eGFR has been calculated using the CKD EPI equation. This calculation has not been validated in all clinical situations. eGFR's persistently <60 mL/min signify possible Chronic Kidney Disease.    Anion gap 9 5 - 15  CBC     Status: Abnormal   Collection Time: 03/19/16  7:48 PM  Result Value Ref Range   WBC 13.7 (H) 4.0 - 10.5 K/uL   RBC 4.02 3.87 - 5.11 MIL/uL   Hemoglobin 11.8 (L) 12.0 - 15.0 g/dL   HCT 37.1 36.0 - 46.0 %   MCV 92.3 78.0 - 100.0 fL   MCH 29.4 26.0 - 34.0 pg   MCHC 31.8 30.0 - 36.0 g/dL   RDW 14.2 11.5 - 15.5 %   Platelets 294 150 - 400 K/uL  Protime-INR - (order if patient is taking Coumadin / Warfarin)     Status: None   Collection Time: 03/19/16  7:48 PM  Result Value  Ref Range   Prothrombin Time 14.4 11.4 - 15.2 seconds   INR 8.93   Salicylate level     Status: None   Collection Time: 03/19/16  8:03 PM  Result Value Ref Range   Salicylate Lvl 73.4 2.8 - 30.0 mg/dL  Acetaminophen level     Status: Abnormal   Collection Time: 03/19/16  8:03 PM  Result Value Ref Range   Acetaminophen (Tylenol), Serum <10 (L) 10 - 30 ug/mL    Comment:        THERAPEUTIC CONCENTRATIONS VARY SIGNIFICANTLY. A RANGE OF 10-30 ug/mL MAY BE AN EFFECTIVE CONCENTRATION FOR MANY PATIENTS. HOWEVER, SOME ARE BEST TREATED AT CONCENTRATIONS OUTSIDE THIS RANGE. ACETAMINOPHEN CONCENTRATIONS >150 ug/mL AT 4 HOURS AFTER INGESTION AND >50 ug/mL AT 12 HOURS AFTER INGESTION ARE OFTEN ASSOCIATED WITH TOXIC REACTIONS.   Ammonia     Status: None   Collection Time: 03/19/16 10:31 PM  Result Value Ref Range   Ammonia 27 9 - 35 umol/L  I-Stat venous blood gas, ED     Status: Abnormal   Collection Time: 03/19/16 10:34 PM  Result Value Ref Range   pH, Ven 7.268 7.250 - 7.430   pCO2, Ven 46.6 44.0 - 60.0 mmHg   pO2, Ven 22.0 (LL) 32.0 - 45.0 mmHg   Bicarbonate 21.3 20.0 - 28.0 mmol/L   TCO2 23 0 - 100 mmol/L   O2 Saturation 31.0 %   Acid-base deficit 6.0 (H) 0.0 - 2.0 mmol/L   Patient temperature HIDE    Sample type VENOUS    Comment NOTIFIED PHYSICIAN   I-Stat  CG4 Lactic Acid, ED     Status: None   Collection Time: 03/19/16 10:35 PM  Result Value Ref Range   Lactic Acid, Venous 1.56 0.5 - 1.9 mmol/L  Urinalysis, Routine w reflex microscopic     Status: None   Collection Time: 03/19/16 11:50 PM  Result Value Ref Range   Color, Urine YELLOW YELLOW   APPearance CLEAR CLEAR   Specific Gravity, Urine 1.010 1.005 - 1.030   pH 5.0 5.0 - 8.0   Glucose, UA NEGATIVE NEGATIVE mg/dL   Hgb urine dipstick NEGATIVE NEGATIVE   Bilirubin Urine NEGATIVE NEGATIVE   Ketones, ur NEGATIVE NEGATIVE mg/dL   Protein, ur NEGATIVE NEGATIVE mg/dL   Nitrite NEGATIVE NEGATIVE   Leukocytes, UA  NEGATIVE NEGATIVE    Comment: MICROSCOPIC NOT DONE ON URINES WITH NEGATIVE PROTEIN, BLOOD, LEUKOCYTES, NITRITE, OR GLUCOSE <1000 mg/dL.  Rapid urine drug screen (hospital performed)     Status: Abnormal   Collection Time: 03/19/16 11:50 PM  Result Value Ref Range   Opiates NONE DETECTED NONE DETECTED   Cocaine NONE DETECTED NONE DETECTED   Benzodiazepines POSITIVE (A) NONE DETECTED   Amphetamines NONE DETECTED NONE DETECTED   Tetrahydrocannabinol NONE DETECTED NONE DETECTED   Barbiturates NONE DETECTED NONE DETECTED    Comment:        DRUG SCREEN FOR MEDICAL PURPOSES ONLY.  IF CONFIRMATION IS NEEDED FOR ANY PURPOSE, NOTIFY LAB WITHIN 5 DAYS.        LOWEST DETECTABLE LIMITS FOR URINE DRUG SCREEN Drug Class       Cutoff (ng/mL) Amphetamine      1000 Barbiturate      200 Benzodiazepine   585 Tricyclics       277 Opiates          300 Cocaine          300 THC              50   Troponin I (q 6hr x 3)     Status: Abnormal   Collection Time: 03/20/16  2:59 AM  Result Value Ref Range   Troponin I 0.03 (HH) <0.03 ng/mL    Comment: CRITICAL RESULT CALLED TO, READ BACK BY AND VERIFIED WITH: Nadene Rubins [id number] [facility]   Hepatic function panel     Status: Abnormal   Collection Time: 03/20/16  2:59 AM  Result Value Ref Range   Total Protein 6.4 (L) 6.5 - 8.1 g/dL   Albumin 3.0 (L) 3.5 - 5.0 g/dL   AST 11 (L) 15 - 41 U/L   ALT 6 (L) 14 - 54 U/L   Alkaline Phosphatase 50 38 - 126 U/L   Total Bilirubin 0.5 0.3 - 1.2 mg/dL   Bilirubin, Direct <0.1 (L) 0.1 - 0.5 mg/dL   Indirect Bilirubin NOT CALCULATED 0.3 - 0.9 mg/dL  Salicylate level     Status: None   Collection Time: 03/20/16  2:59 AM  Result Value Ref Range   Salicylate Lvl 36.1 2.8 - 30.0 mg/dL  Basic metabolic panel     Status: Abnormal   Collection Time: 03/20/16  2:59 AM  Result Value Ref Range   Sodium 145 135 - 145 mmol/L   Potassium 3.9 3.5 - 5.1 mmol/L   Chloride 118 (H) 101 - 111 mmol/L   CO2 20 (L) 22 - 32  mmol/L   Glucose, Bld 88 65 - 99 mg/dL   BUN 29 (H) 6 - 20 mg/dL   Creatinine, Ser 1.65 (H) 0.44 -  1.00 mg/dL   Calcium 10.1 8.9 - 10.3 mg/dL   GFR calc non Af Amer 31 (L) >60 mL/min   GFR calc Af Amer 36 (L) >60 mL/min    Comment: (NOTE) The eGFR has been calculated using the CKD EPI equation. This calculation has not been validated in all clinical situations. eGFR's persistently <60 mL/min signify possible Chronic Kidney Disease.    Anion gap 7 5 - 15  CBC with Differential/Platelet     Status: Abnormal   Collection Time: 03/20/16  2:59 AM  Result Value Ref Range   WBC 13.1 (H) 4.0 - 10.5 K/uL   RBC 3.75 (L) 3.87 - 5.11 MIL/uL   Hemoglobin 10.8 (L) 12.0 - 15.0 g/dL   HCT 34.3 (L) 36.0 - 46.0 %   MCV 91.5 78.0 - 100.0 fL   MCH 28.8 26.0 - 34.0 pg   MCHC 31.5 30.0 - 36.0 g/dL   RDW 14.2 11.5 - 15.5 %   Platelets 258 150 - 400 K/uL   Neutrophils Relative % 78 %   Neutro Abs 10.2 (H) 1.7 - 7.7 K/uL   Lymphocytes Relative 17 %   Lymphs Abs 2.3 0.7 - 4.0 K/uL   Monocytes Relative 3 %   Monocytes Absolute 0.4 0.1 - 1.0 K/uL   Eosinophils Relative 2 %   Eosinophils Absolute 0.2 0.0 - 0.7 K/uL   Basophils Relative 0 %   Basophils Absolute 0.0 0.0 - 0.1 K/uL  CK     Status: None   Collection Time: 03/20/16  2:59 AM  Result Value Ref Range   Total CK 89 38 - 234 U/L  Glucose, capillary     Status: None   Collection Time: 03/20/16  3:17 AM  Result Value Ref Range   Glucose-Capillary 85 65 - 99 mg/dL  Troponin I (q 6hr x 3)     Status: Abnormal   Collection Time: 03/20/16  7:25 AM  Result Value Ref Range   Troponin I 0.03 (HH) <0.03 ng/mL    Comment: CRITICAL VALUE NOTED.  VALUE IS CONSISTENT WITH PREVIOUSLY REPORTED AND CALLED VALUE.  Glucose, capillary     Status: None   Collection Time: 03/20/16  8:00 AM  Result Value Ref Range   Glucose-Capillary 89 65 - 99 mg/dL  Glucose, capillary     Status: None   Collection Time: 03/20/16 11:15 AM  Result Value Ref Range    Glucose-Capillary 85 65 - 99 mg/dL  Glucose, capillary     Status: None   Collection Time: 03/20/16  4:26 PM  Result Value Ref Range   Glucose-Capillary 90 65 - 99 mg/dL  Troponin I (q 6hr x 3)     Status: Abnormal   Collection Time: 03/20/16  5:01 PM  Result Value Ref Range   Troponin I 0.13 (HH) <0.03 ng/mL    Comment: CRITICAL VALUE NOTED.  VALUE IS CONSISTENT WITH PREVIOUSLY REPORTED AND CALLED VALUE.  Glucose, capillary     Status: None   Collection Time: 03/20/16  8:27 PM  Result Value Ref Range   Glucose-Capillary 79 65 - 99 mg/dL  Glucose, capillary     Status: None   Collection Time: 03/21/16 12:22 AM  Result Value Ref Range   Glucose-Capillary 90 65 - 99 mg/dL  Glucose, capillary     Status: None   Collection Time: 03/21/16  4:38 AM  Result Value Ref Range   Glucose-Capillary 87 65 - 99 mg/dL  CBC     Status: Abnormal  Collection Time: 03/21/16  6:08 AM  Result Value Ref Range   WBC 9.2 4.0 - 10.5 K/uL   RBC 4.12 3.87 - 5.11 MIL/uL   Hemoglobin 11.9 (L) 12.0 - 15.0 g/dL   HCT 37.4 36.0 - 46.0 %   MCV 90.8 78.0 - 100.0 fL   MCH 28.9 26.0 - 34.0 pg   MCHC 31.8 30.0 - 36.0 g/dL   RDW 14.0 11.5 - 15.5 %   Platelets 279 150 - 400 K/uL  Comprehensive metabolic panel     Status: Abnormal   Collection Time: 03/21/16  6:08 AM  Result Value Ref Range   Sodium 146 (H) 135 - 145 mmol/L   Potassium 3.7 3.5 - 5.1 mmol/L   Chloride 115 (H) 101 - 111 mmol/L   CO2 23 22 - 32 mmol/L   Glucose, Bld 80 65 - 99 mg/dL   BUN 22 (H) 6 - 20 mg/dL   Creatinine, Ser 1.23 (H) 0.44 - 1.00 mg/dL   Calcium 10.8 (H) 8.9 - 10.3 mg/dL   Total Protein 7.8 6.5 - 8.1 g/dL   Albumin 3.4 (L) 3.5 - 5.0 g/dL   AST 16 15 - 41 U/L   ALT 9 (L) 14 - 54 U/L   Alkaline Phosphatase 57 38 - 126 U/L   Total Bilirubin 0.5 0.3 - 1.2 mg/dL   GFR calc non Af Amer 44 (L) >60 mL/min   GFR calc Af Amer 51 (L) >60 mL/min    Comment: (NOTE) The eGFR has been calculated using the CKD EPI equation. This  calculation has not been validated in all clinical situations. eGFR's persistently <60 mL/min signify possible Chronic Kidney Disease.    Anion gap 8 5 - 15  Vitamin B12     Status: None   Collection Time: 03/21/16  6:08 AM  Result Value Ref Range   Vitamin B-12 416 180 - 914 pg/mL    Comment: (NOTE) This assay is not validated for testing neonatal or myeloproliferative syndrome specimens for Vitamin B12 levels.   Folate     Status: None   Collection Time: 03/21/16  6:08 AM  Result Value Ref Range   Folate 9.7 >5.9 ng/mL  Iron and TIBC     Status: Abnormal   Collection Time: 03/21/16  6:08 AM  Result Value Ref Range   Iron 35 28 - 170 ug/dL   TIBC 430 250 - 450 ug/dL   Saturation Ratios 8 (L) 10.4 - 31.8 %   UIBC 395 ug/dL  Ferritin     Status: None   Collection Time: 03/21/16  6:08 AM  Result Value Ref Range   Ferritin 20 11 - 307 ng/mL  Reticulocytes     Status: None   Collection Time: 03/21/16  6:08 AM  Result Value Ref Range   Retic Ct Pct 1.5 0.4 - 3.1 %   RBC. 4.12 3.87 - 5.11 MIL/uL   Retic Count, Manual 61.8 19.0 - 186.0 K/uL  TSH     Status: None   Collection Time: 03/21/16  6:08 AM  Result Value Ref Range   TSH 0.776 0.350 - 4.500 uIU/mL    Comment: Performed by a 3rd Generation assay with a functional sensitivity of <=0.01 uIU/mL.  RPR     Status: None   Collection Time: 03/21/16  6:08 AM  Result Value Ref Range   RPR Ser Ql Non Reactive Non Reactive    Comment: (NOTE) Performed At: [facility] [address] [place], [place] [id number]  Lindon Romp MD [id number]   HIV antibody     Status: None   Collection Time: 03/21/16  6:08 AM  Result Value Ref Range   HIV Screen 4th Generation wRfx Non Reactive Non Reactive    Comment: (NOTE) Performed At: [facility] [place], [place] [id number] Lindon Romp MD [id number]   Glucose, capillary     Status: Abnormal   Collection Time: 03/21/16  7:49 AM  Result Value  Ref Range   Glucose-Capillary 101 (H) 65 - 99 mg/dL  Glucose, capillary     Status: None   Collection Time: 03/21/16 11:26 AM  Result Value Ref Range   Glucose-Capillary 95 65 - 99 mg/dL  Glucose, capillary     Status: None   Collection Time: 03/21/16  4:18 PM  Result Value Ref Range   Glucose-Capillary 82 65 - 99 mg/dL    Dg Pelvis 1-2 Views  Result Date: 03/20/2016 CLINICAL DATA:  Status post fall.  Pain. EXAM: PELVIS - 1-2 VIEW COMPARISON:  None. FINDINGS: Visualized portions of the lumbosacral surgical hardware is grossly intact. The SI joints and visualized portions sacrum are unremarkable. Degenerative changes seen in the hips. No fractures identified. IMPRESSION: No acute abnormalities. Electronically Signed   By: Dorise Bullion III M.D   On: 03/20/2016 09:00   Ct Head Wo Contrast  Result Date: 03/19/2016 CLINICAL DATA:  Shaking, altered mental status EXAM: CT HEAD WITHOUT CONTRAST TECHNIQUE: Contiguous axial images were obtained from the base of the skull through the vertex without intravenous contrast. COMPARISON:  11/13/2015, MRI 10/02/2009 FINDINGS: Brain: The examination is limited by patient motion. No territorial infarction or intracranial hemorrhage is seen. Mild to moderate subcortical and periventricular white matter hypodensities, likely small vessel change. No focal mass, mass effect or midline shift. The ventricles are nonenlarged. Vascular: No hyperdense vessels. Mild carotid artery calcifications. Skull: Mastoid air cells clear.  No fracture. Sinuses/Orbits: No acute finding. Other: None IMPRESSION: 1. Suboptimal study secondary to patient motion. 2. No definite acute intracranial abnormality. Electronically Signed   By: Donavan Foil M.D.   On: 03/19/2016 21:11   Mr Brain Wo Contrast  Result Date: 03/21/2016 CLINICAL DATA:  Acute encephalopathy. EXAM: MRI HEAD WITHOUT CONTRAST TECHNIQUE: Multiplanar, multiecho pulse sequences of the brain and surrounding structures were  obtained without intravenous contrast. COMPARISON:  Head CT 03/19/2016 and MRI 12/19/2011 FINDINGS: Despite numerous attempts at imaging and the patient receiving medication, the patient could not tolerate a complete examination. Only axial diffusion and an incomplete axial T2* gradient echo sequence were obtained. The diffusion series is severely motion degraded. There is no evidence of large acute infarct, however there is a 5 mm focus of abnormal trace diffusion signal in the right medulla (series 16, image 19) which appears to have corresponding low ADC and is suspicious for a small acute infarct. No gross intracranial hemorrhage is seen on the gradient echo series. Mild cerebral atrophy is within normal limits for age. Patchy cerebral white matter T2 hyperintensities are again seen and are nonspecific but suggestive of moderate chronic small vessel ischemic disease. IMPRESSION: 1. Severely motion degraded, incomplete examination. 2. 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 Electronically Signed   By: Logan Bores M.D.   On: 03/21/2016 16:44   Dg Chest Portable 1 View  Result Date: 03/19/2016 CLINICAL DATA:  Altered mental status. EXAM: PORTABLE CHEST 1 VIEW COMPARISON:  02/19/2016 FINDINGS: A single AP portable view of the chest demonstrates no focal airspace consolidation  or alveolar edema. The lungs are grossly clear. There is no large effusion or pneumothorax. Cardiac and mediastinal contours appear unremarkable. IMPRESSION: No active disease. Electronically Signed   By: Andreas Newport M.D.   On: 03/19/2016 21:40    ROS Blood pressure (!) 145/84, pulse 84, temperature 99.5 F (37.5 C), temperature source Oral, resp. rate 17, weight 55.9 kg (123 lb 3.8 oz), SpO2 98 %. Neurologic Examination:  Awake, alert, Disoriented to year, month, date, day, president, but knows hospital, city ,state. No language disturbance.   Face symmetric. Tongue midline. EOMI. Strength 5/5  bilaterally. Coordination- intact. Sensation- intact. No babinski.  Assessment/Plan: Encephalopathy secondary to renal insufficiency compounded by medication effects of Xanax and Gabapentin, the latter which is purely renal excreted and therefore had significantly prolonged half life.  The meds have been stopped and kidney is improving, so expect continued improvement in mental status.  I will do an EEG to assess brain activity.    I do not feel she has had a brainstem infarct and MRI finding is likely artifact.  She has a non-focal neurological exam and midbrain infarct would not explain her encephalopathy.      Rogue Jury, MD 03/21/2016, 5:53 PM

## 2016-03-21 NOTE — Progress Notes (Addendum)
Patient in her room with her daughter and sister.  Patient able to correctly identify these relatives along with their names.  Patient able to identify her shrimp allergy.  Patient alert and oriented to self and location.  Peri MarisAndrew Daishon Chui, MBA, BSN, RN

## 2016-03-22 LAB — CBC
HCT: 37.3 % (ref 36.0–46.0)
Hemoglobin: 12 g/dL (ref 12.0–15.0)
MCH: 28.9 pg (ref 26.0–34.0)
MCHC: 32.2 g/dL (ref 30.0–36.0)
MCV: 89.9 fL (ref 78.0–100.0)
Platelets: 250 10*3/uL (ref 150–400)
RBC: 4.15 MIL/uL (ref 3.87–5.11)
RDW: 13.8 % (ref 11.5–15.5)
WBC: 8.9 10*3/uL (ref 4.0–10.5)

## 2016-03-22 LAB — GLUCOSE, CAPILLARY
GLUCOSE-CAPILLARY: 103 mg/dL — AB (ref 65–99)
GLUCOSE-CAPILLARY: 93 mg/dL (ref 65–99)
GLUCOSE-CAPILLARY: 96 mg/dL (ref 65–99)
Glucose-Capillary: 91 mg/dL (ref 65–99)
Glucose-Capillary: 97 mg/dL (ref 65–99)
Glucose-Capillary: 101 mg/dL — ABNORMAL HIGH (ref 65–99)

## 2016-03-22 LAB — BASIC METABOLIC PANEL
Anion gap: 6 (ref 5–15)
BUN: 21 mg/dL — AB (ref 6–20)
CHLORIDE: 113 mmol/L — AB (ref 101–111)
CO2: 25 mmol/L (ref 22–32)
CREATININE: 1.11 mg/dL — AB (ref 0.44–1.00)
Calcium: 10.6 mg/dL — ABNORMAL HIGH (ref 8.9–10.3)
GFR calc Af Amer: 58 mL/min — ABNORMAL LOW (ref >60)
GFR calc non Af Amer: 50 mL/min — ABNORMAL LOW (ref >60)
GLUCOSE: 105 mg/dL — AB (ref 65–99)
Potassium: 3.6 mmol/L (ref 3.5–5.1)
Sodium: 144 mmol/L (ref 135–145)

## 2016-03-22 MED ORDER — INFLUENZA VAC SPLIT QUAD 0.5 ML IM SUSY
0.5000 mL | PREFILLED_SYRINGE | INTRAMUSCULAR | Status: DC
  Filled 2016-03-22: qty 0.5

## 2016-03-22 MED ORDER — PNEUMOCOCCAL VAC POLYVALENT 25 MCG/0.5ML IJ INJ
0.5000 mL | INJECTION | INTRAMUSCULAR | Status: DC
Start: 2016-03-23 — End: 2016-03-24
  Filled 2016-03-22: qty 0.5

## 2016-03-22 NOTE — Progress Notes (Signed)
Subjective: Feels more clear headed.    Objective: Vital signs in last 24 hours: Temp:  [98.5 F (36.9 C)-99.5 F (37.5 C)] 98.5 F (36.9 C) (11/05 0859) Pulse Rate:  [78-98] 78 (11/05 0859) Resp:  [17-19] 18 (11/05 0859) BP: (126-145)/(74-93) 126/74 (11/05 0859) SpO2:  [94 %-100 %] 96 % (11/05 0948)  Intake/Output from previous day: 11/04 0701 - 11/05 0700 In: 600 [P.O.:600] Out: 0  Intake/Output this shift: Total I/O In: 120 [P.O.:120] Out: 0  Nutritional status: DIET - DYS 1 Room service appropriate? Yes; Fluid consistency: Thin  Neurologic Exam:  Awake, alert Initially she has difficulty with answering orientation questions, but when asked later in the exam she is able to answer most appropriately.  When asked why she couldn't do that before she said it's because " I make her nervous because I look so good".   EOMI, PERL. Face symmetric. Tongue midline. Strength 5/5 bilateral. No pronator drift.  Coord - intact Sensory- intact.  Lab Results:  Recent Labs  03/21/16 0608 03/22/16 0550  WBC 9.2 8.9  HGB 11.9* 12.0  HCT 37.4 37.3  PLT 279 250  NA 146* 144  K 3.7 3.6  CL 115* 113*  CO2 23 25  GLUCOSE 80 105*  BUN 22* 21*  CREATININE 1.23* 1.11*  CALCIUM 10.8* 10.6*   Lipid Panel No results for input(s): CHOL, TRIG, HDL, CHOLHDL, VLDL, LDLCALC in the last 72 hours.  Studies/Results: Mr Brain Wo Contrast  Result Date: 03/21/2016 CLINICAL DATA:  Acute encephalopathy. EXAM: MRI HEAD WITHOUT CONTRAST TECHNIQUE: Multiplanar, multiecho pulse sequences of the brain and surrounding structures were obtained without intravenous contrast. COMPARISON:  Head CT 03/19/2016 and MRI 12/19/2011 FINDINGS: Despite numerous attempts at imaging and the patient receiving medication, the patient could not tolerate a complete examination. Only axial diffusion and an incomplete axial T2* gradient echo sequence were obtained. The diffusion series is severely motion degraded. There is  no evidence of large acute infarct, however there is a 5 mm focus of abnormal trace diffusion signal in the right medulla (series 16, image 19) which appears to have corresponding low ADC and is suspicious for a small acute infarct. No gross intracranial hemorrhage is seen on the gradient echo series. Mild cerebral atrophy is within normal limits for age. Patchy cerebral white matter T2 hyperintensities are again seen and are nonspecific but suggestive of moderate chronic small vessel ischemic disease. IMPRESSION: 1. Severely motion degraded, incomplete examination. 2. 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 Electronically Signed   By: Sebastian AcheAllen  Grady M.D.   On: 03/21/2016 16:44    Medications:  Scheduled: . heparin subcutaneous  5,000 Units Subcutaneous Q8H  . metoprolol tartrate  25 mg Oral BID  . mometasone-formoterol  2 puff Inhalation BID  . thiamine  100 mg Oral Daily  . tiotropium  18 mcg Inhalation Daily    Assessment/Plan:  Encephalopathy - slowing improving as kidney function is normalizing.  There may be a component of possible underlying mild Alzheimer's dementia which can be evaluated once all metabolic derangements are normalized as outpatient.      LOS: 2 days   Weston SettleESHRAGHI, Aishwarya Shiplett, MD 03/22/2016  12:23 PM

## 2016-03-22 NOTE — Progress Notes (Signed)
[facility] PROGRESS NOTE  [name] A Dokken RUE:[id number] DOB: 1945/08/25 DOA: 03/19/2016  PCP: Rinaldo CloudHarwani, Mohan, MD  Brief History/Interval Summary: 70 year old African-American female with a past medical history of hypertension, COPD, history of back pain and anxiety disorder, was brought into the emergency department after patient was found to be confused by her family members. Patient apparently lives alone. She was hospitalized for further management.  Reason for Visit: Acute encephalopathy  Consultants: Neurology  Procedures:  EEG "Impression: This awake EEG is abnormal due to diffuse slowing and presence of triphasic waves of the waking background.  Clinical Correlation of the above findings indicates diffuse cerebral dysfunction that is non-specific in etiology, but triphasic waves and can be seen with toxic/metabolic encephalopathies.  The absence of epileptiform discharges does not rule out a clinical diagnosis of epilepsy.  Clinical correlation is advised."  Antibiotics: None  Subjective/Interval History: Patient is much more awake, alert. Less distracted. Denies any pain.   ROS: Denies any nausea or vomiting.  Objective:  Vital Signs  Vitals:   03/21/16 1702 03/21/16 2118 03/21/16 2236 03/22/16 0523  BP: (!) 145/84 (!) 140/92  (!) 141/93  Pulse: 84 98  90  Resp: 17 18  19   Temp: 99.5 F (37.5 C) 98.9 F (37.2 C)  99.4 F (37.4 C)  TempSrc: Oral Oral  Oral  SpO2: 98% 100% 94% 100%  Weight:        Intake/Output Summary (Last 24 hours) at 03/22/16 0844 Last data filed at 03/21/16 1857  Gross per 24 hour  Intake              600 ml  Output                0 ml  Net              600 ml   Filed Weights   03/20/16 0041 03/20/16 2118  Weight: 55.9 kg (123 lb 4.8 oz) 55.9 kg (123 lb 3.8 oz)    General appearance: Seems to be much more awake, alert and less distracted today compared to previously.  Resp: clear to auscultation bilaterally Cardio:  regular rate and rhythm, S1, S2 normal, no murmur, click, rub or gallop GI: soft, non-tender; bowel sounds normal; no masses,  no organomegaly Extremities: extremities normal, atraumatic, no cyanosis or edema. Callus sole of foot. Neurologic: Awake, alert. Oriented to place. Oriented also to year and month. Oriented also to president. No focal neurological deficits.  Lab Results:  Data Reviewed: I have personally reviewed following labs and imaging studies  CBC:  Recent Labs Lab 03/19/16 1948 03/20/16 0259 03/21/16 0608 03/22/16 0550  WBC 13.7* 13.1* 9.2 8.9  NEUTROABS  --  10.2*  --   --   HGB 11.8* 10.8* 11.9* 12.0  HCT 37.1 34.3* 37.4 37.3  MCV 92.3 91.5 90.8 89.9  PLT 294 258 279 250    Basic Metabolic Panel:  Recent Labs Lab 03/19/16 1948 03/20/16 0259 03/21/16 0608 03/22/16 0550  NA 145 145 146* 144  K 4.6 3.9 3.7 3.6  CL 114* 118* 115* 113*  CO2 22 20* 23 25  GLUCOSE 94 88 80 105*  BUN 33* 29* 22* 21*  CREATININE 2.05* 1.65* 1.23* 1.11*  CALCIUM 10.4* 10.1 10.8* 10.6*    GFR: Estimated Creatinine Clearance: 42.8 mL/min (by C-G formula based on SCr of 1.11 mg/dL (H)).  Liver Function Tests:  Recent Labs Lab 03/19/16 1948 03/20/16 0259 03/21/16 0608  AST 14* 11* 16  ALT 8* 6* 9*  ALKPHOS 58 50 57  BILITOT 0.3 0.5 0.5  PROT 7.6 6.4* 7.8  ALBUMIN 3.4* 3.0* 3.4*     Recent Labs Lab 03/19/16 2231  AMMONIA 27    Coagulation Profile:  Recent Labs Lab 03/19/16 1948  INR 1.11    Cardiac Enzymes:  Recent Labs Lab 03/20/16 0259 03/20/16 0725 03/20/16 1701  CKTOTAL 89  --   --   TROPONINI 0.03* 0.03* 0.13*   CBG:  Recent Labs Lab 03/21/16 1618 03/21/16 2036 03/22/16 0016 03/22/16 0404 03/22/16 0800  GLUCAP 82 96 93 91 103*     Radiology Studies: Dg Pelvis 1-2 Views  Result Date: 03/20/2016 CLINICAL DATA:  Status post fall.  Pain. EXAM: PELVIS - 1-2 VIEW COMPARISON:  None. FINDINGS: Visualized portions of the lumbosacral  surgical hardware is grossly intact. The SI joints and visualized portions sacrum are unremarkable. Degenerative changes seen in the hips. No fractures identified. IMPRESSION: No acute abnormalities. Electronically Signed   By: Gerome Samavid  Williams III M.D   On: 03/20/2016 09:00   Mr Brain Wo Contrast  Result Date: 03/21/2016 CLINICAL DATA:  Acute encephalopathy. EXAM: MRI HEAD WITHOUT CONTRAST TECHNIQUE: Multiplanar, multiecho pulse sequences of the brain and surrounding structures were obtained without intravenous contrast. COMPARISON:  Head CT 03/19/2016 and MRI 12/19/2011 FINDINGS: Despite numerous attempts at imaging and the patient receiving medication, the patient could not tolerate a complete examination. Only axial diffusion and an incomplete axial T2* gradient echo sequence were obtained. The diffusion series is severely motion degraded. There is no evidence of large acute infarct, however there is a 5 mm focus of abnormal trace diffusion signal in the right medulla (series 16, image 19) which appears to have corresponding low ADC and is suspicious for a small acute infarct. No gross intracranial hemorrhage is seen on the gradient echo series. Mild cerebral atrophy is within normal limits for age. Patchy cerebral white matter T2 hyperintensities are again seen and are nonspecific but suggestive of moderate chronic small vessel ischemic disease. IMPRESSION: 1. Severely motion degraded, incomplete examination. 2. 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 Electronically Signed   By: Sebastian AcheAllen  Grady M.D.   On: 03/21/2016 16:44     Medications:  Scheduled: . heparin subcutaneous  5,000 Units Subcutaneous Q8H  . metoprolol tartrate  25 mg Oral BID  . mometasone-formoterol  2 puff Inhalation BID  . thiamine  100 mg Oral Daily  . tiotropium  18 mcg Inhalation Daily   Continuous:  ZOX:[id number]:albuterol, haloperidol, hydrALAZINE  Assessment/Plan:  Principal Problem:   Acute encephalopathy Active  Problems:   Essential hypertension   AKI (acute kidney injury) (HCC)   Altered mental status    Acute encephalopathy Mental status appears to have improved significantly. Still needs to be monitored on and off. Etiology most likely appears to be due to medications that she was taking at home including gabapentin and alprazolam. Levels of these were likely high in her system due to renal failure. Her renal failure has resolved. Mental status is gradually improving. CT of the head was done. However, it was a suboptimal study, which did not show any acute findings. MRI was finally done yesterday. However, motion degraded. Concern was for an acute stroke in the middle ear. Discussed with neurology. Appreciate neurology input. The finding noted on MRI, start to be an artifact. They feel also that her encephalopathy is drug-induced. Urine drug screen was positive only for benzodiazepine, which is to be expected considering that the  patient is on alprazolam at home. EEG was done which did not show any epileptiform activity. Ammonia level is normal. UA did not suggest infection. Chest x-ray was clear as well. B-12 level 416. TSH normal. RPR and HIV nonreactive. Continue thiamine.   Acute renal failure Probably from dehydration and poor oral intake. With hydration renal function has improved. Hydrate orally.  Mildly elevated troponin. Possibly due to renal failure. EKG did not show any ischemic changes. Echocardiogram is pending.  Mild hypercalcemia Could be due to hypovolemia and dehydration. The level is not high enough to cause her mental confusion. Continue to recheck. Urine did not show any protein. May need further workup if remains persistently elevated despite hydration. Previous values also reviewed. She has always had some degree of hypercalcemia. She will need outpatient evaluation for the same.  History of essential Hypertension Stable. Monitor blood pressures closely.   History of  COPD Stable. Continue to monitor.   History of back pain and anxiety Holding off medications due to her confusion. Watch for symptoms of withdrawal.  DVT Prophylaxis: Subcutaneous heparin    Code Status: Full code  Family Communication: Discussed with her daughter Disposition Plan: Continues to make progress. PT evaluation. Anticipate discharge in the next 1-2 days.    LOS: 2 days   [facility]  [facility] Pager [phone] 03/22/2016, 8:44 AM  If 7PM-7AM, please contact night-coverage at [email or web address], password [facility]

## 2016-03-22 NOTE — Progress Notes (Signed)
Paged Dr. Rito EhrlichKrishnan, pt does not like dyphagia 1 diet and would like real food, returned page may call speech for re-evaluation.

## 2016-03-23 ENCOUNTER — Inpatient Hospital Stay (HOSPITAL_COMMUNITY): Payer: Medicare Other

## 2016-03-23 DIAGNOSIS — R7989 Other specified abnormal findings of blood chemistry: Secondary | ICD-10-CM

## 2016-03-23 DIAGNOSIS — R000 Tachycardia, unspecified: Secondary | ICD-10-CM

## 2016-03-23 DIAGNOSIS — R778 Other specified abnormalities of plasma proteins: Secondary | ICD-10-CM

## 2016-03-23 LAB — ECHOCARDIOGRAM COMPLETE
CHL CUP MV DEC (S): 264
E decel time: 264 msec
E/e' ratio: 6.18
FS: 22 % — AB (ref 28–44)
HEIGHTINCHES: 62.5 in
IV/PV OW: 1.01
LA ID, A-P, ES: 27 mm
LA vol A4C: 24 ml
LADIAMINDEX: 1.79 cm/m2
LAVOL: 30.5 mL
LAVOLIN: 20.2 mL/m2
LEFT ATRIUM END SYS DIAM: 27 mm
LV E/e' medial: 6.18
LV E/e'average: 6.18
LV PW d: 11.3 mm — AB (ref 0.6–1.1)
LV TDI E'MEDIAL: 7.83
LVELAT: 9.25 cm/s
MVPKAVEL: 57.7 m/s
MVPKEVEL: 57.2 m/s
TAPSE: 20.7 mm
TDI e' lateral: 9.25
WEIGHTICAEL: 1812.8 [oz_av]

## 2016-03-23 LAB — GLUCOSE, CAPILLARY
GLUCOSE-CAPILLARY: 103 mg/dL — AB (ref 65–99)
GLUCOSE-CAPILLARY: 114 mg/dL — AB (ref 65–99)
GLUCOSE-CAPILLARY: 99 mg/dL (ref 65–99)
Glucose-Capillary: 90 mg/dL (ref 65–99)
Glucose-Capillary: 95 mg/dL (ref 65–99)
Glucose-Capillary: 96 mg/dL (ref 65–99)

## 2016-03-23 MED ORDER — THIAMINE HCL 100 MG PO TABS: 100.0000 mg | ORAL_TABLET | Freq: Every day | ORAL | 0 refills | Status: DC

## 2016-03-23 NOTE — Progress Notes (Signed)
[facility] PROGRESS NOTE  [name] A Shirah ZOX:[id number] DOB: [birth year] DOA: 03/19/2016  PCP: Rinaldo CloudHarwani, Mohan, MD  Brief History/Interval Summary: 70 year old African-American female with a past medical history of hypertension, COPD, history of back pain and anxiety disorder, was brought into the emergency department after patient was found to be confused by her family members. Patient apparently lives alone. She was hospitalized for further management.  Reason for Visit: Acute encephalopathy  Consultants: Neurology  Procedures:  EEG "Impression: This awake EEG is abnormal due to diffuse slowing and presence of triphasic waves of the waking background.  Clinical Correlation of the above findings indicates diffuse cerebral dysfunction that is non-specific in etiology, but triphasic waves and can be seen with toxic/metabolic encephalopathies.  The absence of epileptiform discharges does not rule out a clinical diagnosis of epilepsy.  Clinical correlation is advised."  Transthoracic echocardiogram Study Conclusions - Left ventricle: The cavity size was normal. Wall thickness was   normal. Systolic function was normal. The estimated ejection   fraction was in the range of 50% to 55%. Wall motion was normal;   there were no regional wall motion abnormalities. Doppler   parameters are consistent with abnormal left ventricular   relaxation (grade 1 diastolic dysfunction).  Antibiotics: None  Subjective/Interval History: Patient denies any complaints this morning. Happy that she has improved. Denies any pain.   ROS: Denies any nausea or vomiting.  Objective:  Vital Signs  Vitals:   03/22/16 0948 03/22/16 1758 03/22/16 2023 03/22/16 2108  BP:  134/79 137/84   Pulse:  87 82   Resp:  17 18   Temp:  100 F (37.8 C) 99.2 F (37.3 C)   TempSrc:  Oral Oral   SpO2: 96% 99% 100%   Weight:   51.4 kg (113 lb 4.8 oz)   Height:    5' 2.5" (1.588 m)    Intake/Output  Summary (Last 24 hours) at 03/23/16 0806 Last data filed at 03/22/16 1841  Gross per 24 hour  Intake              600 ml  Output                0 ml  Net              600 ml   Filed Weights   03/20/16 0041 03/20/16 2118 03/22/16 2023  Weight: 55.9 kg (123 lb 4.8 oz) 55.9 kg (123 lb 3.8 oz) 51.4 kg (113 lb 4.8 oz)    General appearance: Patient is awake, alert. Less distracted.  Resp: clear to auscultation bilaterally Cardio: regular rate and rhythm, S1, S2 normal, no murmur, click, rub or gallop GI: soft, non-tender; bowel sounds normal; no masses,  no organomegaly Extremities: extremities normal, atraumatic, no cyanosis or edema. Callus noted sole of foot. Neurologic: Awake, alert. Oriented to place. Took some time to tell me the year and month today. Was able to tell me the name of the president. No focal neurological deficits.  Lab Results:  Data Reviewed: I have personally reviewed following labs and imaging studies  CBC:  Recent Labs Lab 03/19/16 1948 03/20/16 0259 03/21/16 0608 03/22/16 0550  WBC 13.7* 13.1* 9.2 8.9  NEUTROABS  --  10.2*  --   --   HGB 11.8* 10.8* 11.9* 12.0  HCT 37.1 34.3* 37.4 37.3  MCV 92.3 91.5 90.8 89.9  PLT 294 258 279 250    Basic Metabolic Panel:  Recent Labs Lab 03/19/16 1948 03/20/16 0259 03/21/16  [id number] 03/22/16 0550  NA 145 145 146* 144  K 4.6 3.9 3.7 3.6  CL 114* 118* 115* 113*  CO2 22 20* 23 25  GLUCOSE 94 88 80 105*  BUN 33* 29* 22* 21*  CREATININE 2.05* 1.65* 1.23* 1.11*  CALCIUM 10.4* 10.1 10.8* 10.6*    GFR: Estimated Creatinine Clearance: 39.3 mL/min (by C-G formula based on SCr of 1.11 mg/dL (H)).  Liver Function Tests:  Recent Labs Lab 03/19/16 1948 03/20/16 0259 03/21/16 0608  AST 14* 11* 16  ALT 8* 6* 9*  ALKPHOS 58 50 57  BILITOT 0.3 0.5 0.5  PROT 7.6 6.4* 7.8  ALBUMIN 3.4* 3.0* 3.4*     Recent Labs Lab 03/19/16 2231  AMMONIA 27    Coagulation Profile:  Recent Labs Lab 03/19/16 1948    INR 1.11    Cardiac Enzymes:  Recent Labs Lab 03/20/16 0259 03/20/16 0725 03/20/16 1701  CKTOTAL 89  --   --   TROPONINI 0.03* 0.03* 0.13*   CBG:  Recent Labs Lab 03/22/16 1716 03/22/16 2026 03/23/16 0020 03/23/16 0510 03/23/16 0756  GLUCAP 101* 96 96 103* 114*     Radiology Studies: Mr Brain Wo Contrast  Result Date: 03/21/2016 CLINICAL DATA:  Acute encephalopathy. EXAM: MRI HEAD WITHOUT CONTRAST TECHNIQUE: Multiplanar, multiecho pulse sequences of the brain and surrounding structures were obtained without intravenous contrast. COMPARISON:  Head CT 03/19/2016 and MRI 12/19/2011 FINDINGS: Despite numerous attempts at imaging and the patient receiving medication, the patient could not tolerate a complete examination. Only axial diffusion and an incomplete axial T2* gradient echo sequence were obtained. The diffusion series is severely motion degraded. There is no evidence of large acute infarct, however there is a 5 mm focus of abnormal trace diffusion signal in the right medulla (series 16, image 19) which appears to have corresponding low ADC and is suspicious for a small acute infarct. No gross intracranial hemorrhage is seen on the gradient echo series. Mild cerebral atrophy is within normal limits for age. Patchy cerebral white matter T2 hyperintensities are again seen and are nonspecific but suggestive of moderate chronic small vessel ischemic disease. IMPRESSION: 1. Severely motion degraded, incomplete examination. 2. 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 Electronically Signed   By: Sebastian AcheAllen  Grady M.D.   On: 03/21/2016 16:44     Medications:  Scheduled: . heparin subcutaneous  5,000 Units Subcutaneous Q8H  . Influenza vac split quadrivalent PF  0.5 mL Intramuscular Tomorrow-1000  . metoprolol tartrate  25 mg Oral BID  . mometasone-formoterol  2 puff Inhalation BID  . pneumococcal 23 valent vaccine  0.5 mL Intramuscular Tomorrow-1000  . thiamine  100 mg Oral Daily   . tiotropium  18 mcg Inhalation Daily   Continuous:  RUE:[id number]:albuterol, haloperidol, hydrALAZINE  Assessment/Plan:  Principal Problem:   Acute encephalopathy Active Problems:   Essential hypertension   AKI (acute kidney injury) (HCC)   Altered mental status    Acute encephalopathy Mental status appears to have improved significantly. Etiology most likely appears to be due to medications that she was taking at home including gabapentin and alprazolam. Levels of these were likely supratherapeutic due to renal failure. Her renal failure has resolved. Mental status is gradually improving. CT of the head was done. However, it was a suboptimal study, which did not show any acute findings. MRI was finally done yesterday. However, motion degraded. Concern was for an acute stroke in the medulla. Discussed with neurology. Appreciate neurology input. The finding noted on MRI is  thought to be an artifact. They feel also that her encephalopathy is drug-induced. Urine drug screen was positive only for benzodiazepine, which is to be expected considering that the patient is on alprazolam at home. EEG was done which did not show any epileptiform activity. Ammonia level is normal. UA did not suggest infection. Chest x-ray was clear as well. B-12 level 416. TSH normal. RPR and HIV nonreactive. Continue thiamine. Await PT and OT evaluation.  Acute renal failure Probably from dehydration and poor oral intake. With hydration renal function has improved. Hydrate orally.  Mildly elevated troponin. Possibly due to renal failure. EKG did not show any ischemic changes. Echocardiogram shows normal wall motion. Normal systolic function. Diastolic dysfunction noted. Does not need any further testing at this time.  Mild hypercalcemia Could be due to hypovolemia and dehydration. The level is not high enough to cause her mental confusion. Continue to recheck. Urine did not show any protein. Previous values also reviewed.  She has always had some degree of hypercalcemia. She will need outpatient evaluation for the same.  History of essential Hypertension Stable. Continue to monitor blood pressures closely.   History of COPD Stable. Continue to monitor.   History of back pain and anxiety Holding off medications due to her confusion. Watch for symptoms of withdrawal.  DVT Prophylaxis: Subcutaneous heparin    Code Status: Full code  Family Communication: Discussed with her daughter Disposition Plan: Continues to make progress. PT evaluation. Anticipate discharge tomorrow.    LOS: 3 days   [facility]  [facility] Pager [phone] 03/23/2016, 8:06 AM  If 7PM-7AM, please contact night-coverage at [email or web address], password [facility]

## 2016-03-23 NOTE — Progress Notes (Signed)
*  PRELIMINARY RESULTS* Echocardiogram 2D Echocardiogram has been performed.  Jeryl Columbialliott, Jazelyn Sipe 03/23/2016, 11:05 AM

## 2016-03-23 NOTE — Progress Notes (Signed)
Subjective: Patient states that she is improving. Currently she is getting an echocardiogram. Patient is [facility] . She was unsure of the actual month but when prompted about Halloween patient was able to state that it was October. She pleases 1987. Patient was having trouble showing me her right hand and difficulty with complex commands such is taking her left, and touching her right ear. She was able to follow simple commands such as showing her thumb and wiggling her fingers.  Patient's creatinine has decreased from 2.05-1.11. Patient's creatinine clearance at this point is 39. Patient's TSH and RPR were normal. She is currently off of her Neurontin.  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  Felicie MornDavid Smith PA-C [facility] [phone]  Impression: Altered mental status slowly improving after holding Neurontin and improving creatinine.   Recommendations: At this time would continue to hold Neurontin. Will need to follow up with PCP.  Neurology S/O    03/23/2016, 9:37 AM

## 2016-03-24 ENCOUNTER — Ambulatory Visit: Payer: Medicare Other | Admitting: Physical Therapy

## 2016-03-24 LAB — GLUCOSE, CAPILLARY
GLUCOSE-CAPILLARY: 89 mg/dL (ref 65–99)
Glucose-Capillary: 114 mg/dL — ABNORMAL HIGH (ref 65–99)
Glucose-Capillary: 91 mg/dL (ref 65–99)
Glucose-Capillary: 92 mg/dL (ref 65–99)
Glucose-Capillary: 94 mg/dL (ref 65–99)

## 2016-03-24 NOTE — Care Management Important Message (Signed)
Important Message  Patient Details  Name: [name] MRN: [id number] Date of Birth: 1946-04-02   Medicare Important Message Given:  Yes    Adri Schloss 03/24/2016, 10:12 AM

## 2016-03-24 NOTE — Discharge Summary (Signed)
[facility]  Physician Discharge Summary   Patient ID: [name] MRN: [id number] DOB/AGE: [birth year] 68 y.o.  Admit date: 03/19/2016 Discharge date: 03/24/2016  PCP: Rinaldo Cloud, MD  DISCHARGE DIAGNOSES:  Principal Problem:   Acute encephalopathy Active Problems:   Essential hypertension   AKI (acute kidney injury) (HCC)   Altered mental status   Elevated troponin   RECOMMENDATIONS FOR OUTPATIENT FOLLOW UP: 1. Home health has been ordered. 2. Patient has been instructed to follow-up with her PCP before the end of the week 3. Will need outpatient workup for hypercalcemia   DISCHARGE CONDITION: fair  Diet recommendation: As before  Filed Weights   03/20/16 0041 03/20/16 2118 03/22/16 2023  Weight: 55.9 kg (123 lb 4.8 oz) 55.9 kg (123 lb 3.8 oz) 51.4 kg (113 lb 4.8 oz)    INITIAL HISTORY: 70 year old African-American female with a past medical history of hypertension, COPD, history of back pain and anxiety disorder, was brought into the emergency department after patient was found to be confused by her family members. Patient apparently lives alone. She was hospitalized for further management.  Consultations:  Neurology  Procedures: EEG "Impression: This awake EEG is abnormal due to diffuse slowing and presence of triphasic waves of the waking background.  Clinical Correlation of the above findings indicates diffuse cerebral dysfunction that is non-specific in etiology, but triphasic waves and can be seen with toxic/metabolic encephalopathies. The absence of epileptiform discharges does not rule out a clinical diagnosis of epilepsy. Clinical correlation is advised."  Transthoracic echocardiogram Study Conclusions - Left ventricle: The cavity size was normal. Wall thickness was normal. Systolic function was normal. The estimated ejection fraction was in the range of 50% to 55%. Wall motion was normal; there were no regional wall motion  abnormalities. Doppler parameters are consistent with abnormal left ventricular relaxation (grade 1 diastolic dysfunction).   HOSPITAL COURSE:   Acute encephalopathy Mental status appears to have improved significantly. Etiology most likely appears to be due to medications that she was taking at home including gabapentin and alprazolam. Levels of these were likely supratherapeutic due to renal failure. Her renal failure has resolved. CT of the head was done. However, it was a suboptimal study, which did not show any acute findings. MRI was also done. However, it was a motion degraded study. Concern was for an acute stroke in the medulla. Neurology was consulted. They feel that the finding on the MRI as an artifact and not true stroke. They also feel that her encephalopathy is drug-induced. Urine drug screen was positive only for benzodiazepine, which is to be expected considering that the patient is on alprazolam at home. EEG was done which did not show any epileptiform activity. Ammonia level is normal. UA did not suggest infection. Chest x-ray was clear as well. B-12 level 416. TSH normal. RPR and HIV nonreactive. Patient has significantly improved. She will need to stay home with some family supervision. Her daughter will stay with her for a few days. Home health will be ordered. She has been asked not to take her alprazolam and gabapentin anymore. She will need to follow-up with her PCP.  Acute renal failure Probably from dehydration and poor oral intake. With hydration renal function has improved.   Mildly elevated troponin. Possibly due to renal failure. EKG did not show any ischemic changes. Echocardiogram shows normal wall motion. Normal systolic function. Diastolic dysfunction noted. Does not need any further testing at this time.  Mild hypercalcemia Could be due to hypovolemia and  dehydration. The level is not high enough to cause her mental confusion. Continue to recheck. Urine did  not show any protein. Previous values also reviewed. She has always had some degree of hypercalcemia. She will need outpatient evaluation for the same.  History of essential Hypertension Continue home medications. She has been asked to hold her Lasix for now.  History of COPD Stable  History of back pain and anxiety Holdingoff medications due to her confusion.   She has a callus on the sole of her foot. She has been asked to follow-up with a podiatrist.  Overall improved. Okay for discharge home today with family.    PERTINENT LABS:  The results of significant diagnostics from this hospitalization (including imaging, microbiology, ancillary and laboratory) are listed below for reference.      Labs: Basic Metabolic Panel:  Recent Labs Lab 03/19/16 1948 03/20/16 0259 03/21/16 0608 03/22/16 0550  NA 145 145 146* 144  K 4.6 3.9 3.7 3.6  CL 114* 118* 115* 113*  CO2 22 20* 23 25  GLUCOSE 94 88 80 105*  BUN 33* 29* 22* 21*  CREATININE 2.05* 1.65* 1.23* 1.11*  CALCIUM 10.4* 10.1 10.8* 10.6*   Liver Function Tests:  Recent Labs Lab 03/19/16 1948 03/20/16 0259 03/21/16 0608  AST 14* 11* 16  ALT 8* 6* 9*  ALKPHOS 58 50 57  BILITOT 0.3 0.5 0.5  PROT 7.6 6.4* 7.8  ALBUMIN 3.4* 3.0* 3.4*    Recent Labs Lab 03/19/16 2231  AMMONIA 27   CBC:  Recent Labs Lab 03/19/16 1948 03/20/16 0259 03/21/16 0608 03/22/16 0550  WBC 13.7* 13.1* 9.2 8.9  NEUTROABS  --  10.2*  --   --   HGB 11.8* 10.8* 11.9* 12.0  HCT 37.1 34.3* 37.4 37.3  MCV 92.3 91.5 90.8 89.9  PLT 294 258 279 250   Cardiac Enzymes:  Recent Labs Lab 03/20/16 0259 03/20/16 0725 03/20/16 1701  CKTOTAL 89  --   --   TROPONINI 0.03* 0.03* 0.13*   CBG:  Recent Labs Lab 03/23/16 2047 03/24/16 0031 03/24/16 0459 03/24/16 0756 03/24/16 1201  GLUCAP 99 94 89 91 114*     IMAGING STUDIES Dg Pelvis 1-2 Views  Result Date: 03/20/2016 CLINICAL DATA:  Status post fall.  Pain. EXAM: PELVIS -  1-2 VIEW COMPARISON:  None. FINDINGS: Visualized portions of the lumbosacral surgical hardware is grossly intact. The SI joints and visualized portions sacrum are unremarkable. Degenerative changes seen in the hips. No fractures identified. IMPRESSION: No acute abnormalities. Electronically Signed   By: Gerome Sam III M.D   On: 03/20/2016 09:00   Ct Head Wo Contrast  Result Date: 03/19/2016 CLINICAL DATA:  Shaking, altered mental status EXAM: CT HEAD WITHOUT CONTRAST TECHNIQUE: Contiguous axial images were obtained from the base of the skull through the vertex without intravenous contrast. COMPARISON:  11/13/2015, MRI 10/02/2009 FINDINGS: Brain: The examination is limited by patient motion. No territorial infarction or intracranial hemorrhage is seen. Mild to moderate subcortical and periventricular white matter hypodensities, likely small vessel change. No focal mass, mass effect or midline shift. The ventricles are nonenlarged. Vascular: No hyperdense vessels. Mild carotid artery calcifications. Skull: Mastoid air cells clear.  No fracture. Sinuses/Orbits: No acute finding. Other: None IMPRESSION: 1. Suboptimal study secondary to patient motion. 2. No definite acute intracranial abnormality. Electronically Signed   By: Jasmine Pang M.D.   On: 03/19/2016 21:11   Mr Brain Wo Contrast  Result Date: 03/21/2016 CLINICAL DATA:  Acute encephalopathy. EXAM:  MRI HEAD WITHOUT CONTRAST TECHNIQUE: Multiplanar, multiecho pulse sequences of the brain and surrounding structures were obtained without intravenous contrast. COMPARISON:  Head CT 03/19/2016 and MRI 12/19/2011 FINDINGS: Despite numerous attempts at imaging and the patient receiving medication, the patient could not tolerate a complete examination. Only axial diffusion and an incomplete axial T2* gradient echo sequence were obtained. The diffusion series is severely motion degraded. There is no evidence of large acute infarct, however there is a 5 mm  focus of abnormal trace diffusion signal in the right medulla (series 16, image 19) which appears to have corresponding low ADC and is suspicious for a small acute infarct. No gross intracranial hemorrhage is seen on the gradient echo series. Mild cerebral atrophy is within normal limits for age. Patchy cerebral white matter T2 hyperintensities are again seen and are nonspecific but suggestive of moderate chronic small vessel ischemic disease. IMPRESSION: 1. Severely motion degraded, incomplete examination. 2. 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 Electronically Signed   By: Sebastian AcheAllen  Grady M.D.   On: 03/21/2016 16:44   Dg Chest Portable 1 View  Result Date: 03/19/2016 CLINICAL DATA:  Altered mental status. EXAM: PORTABLE CHEST 1 VIEW COMPARISON:  02/19/2016 FINDINGS: A single AP portable view of the chest demonstrates no focal airspace consolidation or alveolar edema. The lungs are grossly clear. There is no large effusion or pneumothorax. Cardiac and mediastinal contours appear unremarkable. IMPRESSION: No active disease. Electronically Signed   By: Ellery Plunkaniel R Mitchell M.D.   On: 03/19/2016 21:40    DISCHARGE EXAMINATION: Vitals:   03/23/16 2046 03/23/16 2243 03/24/16 0500 03/24/16 1100  BP: (!) 149/85   (!) 154/92  Pulse: 64   61  Resp: 18     Temp: 98.7 F (37.1 C)  98.9 F (37.2 C) 98.4 F (36.9 C)  TempSrc: Oral  Oral Oral  SpO2: 98% 99% 100% 100%  Weight:      Height:       General appearance: alert, cooperative, appears stated age and no distress Resp: clear to auscultation bilaterally Cardio: regular rate and rhythm, S1, S2 normal, no murmur, click, rub or gallop GI: soft, non-tender; bowel sounds normal; no masses,  no organomegaly Extremities: extremities normal, atraumatic, no cyanosis or edema Oriented to place, year, person. Still somewhat confused about month at times.  DISPOSITION: Home with family  Discharge Instructions    Call MD for:  difficulty breathing,  headache or visual disturbances    Complete by:  As directed    Call MD for:  extreme fatigue    Complete by:  As directed    Call MD for:  persistant dizziness or light-headedness    Complete by:  As directed    Call MD for:  persistant nausea and vomiting    Complete by:  As directed    Call MD for:  severe uncontrolled pain    Complete by:  As directed    Call MD for:  temperature >100.4    Complete by:  As directed    Diet - low sodium heart healthy    Complete by:  As directed    Discharge instructions    Complete by:  As directed    Please follow-up with your primary care provider sometime this week. Please talk to them about the slightly high calcium level has been found during this hospitalization. He may need to do further investigations to evaluate this finding. Please stop taking gabapentin and alprazolam for now.  You were cared for by  a hospitalist during your hospital stay. If you have any questions about your discharge medications or the care you received while you were in the hospital after you are discharged, you can call the unit and asked to speak with the hospitalist on call if the hospitalist that took care of you is not available. Once you are discharged, your primary care physician will handle any further medical issues. Please note that NO REFILLS for any discharge medications will be authorized once you are discharged, as it is imperative that you return to your primary care physician (or establish a relationship with a primary care physician if you do not have one) for your aftercare needs so that they can reassess your need for medications and monitor your lab values. If you do not have a primary care physician, you can call [phone] for a physician referral.   Increase activity slowly    Complete by:  As directed       ALLERGIES:  Allergies  Allergen Reactions  . Shrimp [Shellfish Allergy] Hives and Swelling     Current Discharge Medication List    START  taking these medications   Details  thiamine 100 MG tablet Take 1 tablet (100 mg total) by mouth daily. Qty: 30 tablet, Refills: 0      CONTINUE these medications which have NOT CHANGED   Details  albuterol (PROVENTIL HFA;VENTOLIN HFA) 108 (90 BASE) MCG/ACT inhaler Inhale 2 puffs into the lungs every 6 (six) hours as needed for wheezing or shortness of breath.     cetirizine (ZYRTEC) 10 MG tablet Take 10 mg by mouth daily.    diclofenac sodium (VOLTAREN) 1 % GEL Apply 1 application topically 3 (three) times daily. Feet and knees Qty: 3 Tube, Refills: 4    diltiazem (CARDIZEM CD) 240 MG 24 hr capsule Take 240 mg by mouth daily.     Fluticasone-Salmeterol (ADVAIR DISKUS) 250-50 MCG/DOSE AEPB Inhale 1 puff into the lungs 2 (two) times daily.      losartan (COZAAR) 50 MG tablet Take 1 tablet (50 mg total) by mouth daily. Qty: 30 tablet, Refills: 3   Associated Diagnoses: Essential hypertension; Dizziness and giddiness    metoprolol (LOPRESSOR) 50 MG tablet Take 50 mg by mouth 2 (two) times daily.    oxyCODONE-acetaminophen (PERCOCET) 7.5-325 MG tablet Take 1 tablet by mouth every 6 (six) hours as needed for severe pain.    rosuvastatin (CRESTOR) 10 MG tablet Take 10 mg by mouth daily.     sennosides-docusate sodium (SENOKOT-S) 8.6-50 MG tablet Take 2 tablets by mouth 2 (two) times daily.    tiotropium (SPIRIVA) 18 MCG inhalation capsule Place 18 mcg into inhaler and inhale daily.        STOP taking these medications     ALPRAZolam (XANAX) 0.25 MG tablet      furosemide (LASIX) 40 MG tablet      gabapentin (NEURONTIN) 300 MG capsule      montelukast (SINGULAIR) 10 MG tablet          Follow-up Information    [facility] Follow up.   Why:  Home Health RN Home Health Aide, Social Worker Contact information: [address] [place] [address] [phone]        Rinaldo Cloud, MD. Schedule an appointment as soon as possible for a visit in 3  day(s).   Specialty:  Cardiology Contact information: [address] [place] [address] [phone]  TOTAL DISCHARGE TIME: 35 minutes  Unity Medical And Surgical HospitalKRISHNAN,Zachariah Pavek  [facility] Pager [phone]  03/24/2016, 1:50 PM

## 2016-03-24 NOTE — Care Management Note (Signed)
Case Management Note  Patient Details  Name: [name] MRN: [id number] Date of Birth: 1945/10/01  Subjective/Objective:     CM following for progression and d/c planning.                Action/Plan: 03/24/2016 Met with pt re HH needs, PT not recommended, however pt has cognitive issues and per OT would benefit from OT services.  Pt states that she has previously used [facility] services. Services resumed, HHRN added and spoke with pt daughter, Cryder who states that they will most likely take the pt to her home and family will be with her at all times. [place] notified and asked that they  call pt daughter to schedule  [facility] services.Delight Hoh at [phone],   Expected Discharge Date:    03/24/2016              Expected Discharge Plan:  [place]  In-House Referral:  NA  Discharge planning Services  CM Consult  Post Acute Care Choice:  Home Health Choice offered to:  Patient  DME Arranged:  N/A DME Agency:  NA  HH Arranged:  RN, OT, Nurse's Aide, Social Work [employer] Agency:  [place]  Status of Service:  Completed, signed off  If discussed at [employer], dates discussed:    Additional Comments:  Adron Bene, RN 03/24/2016, 12:50 PM

## 2016-03-24 NOTE — Discharge Instructions (Signed)
Hypercalcemia Hypercalcemia is having too much calcium in the blood. The body needs calcium to make bones and keep them strong. Calcium also helps the muscles, nerves, brain, and heart work the way they should. Most of the calcium in the body is in the bones. There is also some calcium in the blood. Hypercalcemia can happen when calcium comes out of the bones, or when the kidneys are not able to remove calcium from the blood. Hypercalcemia can be mild or severe. CAUSES There are many possible causes of hypercalcemia. Common causes include:  Hyperparathyroidism. This is a condition in which the body produces too much parathyroid hormone. There are four parathyroid glands in your neck. These glands produce a chemical messenger (hormone) that helps the body absorb calcium from foods and helps your bones release calcium.  Certain kinds of cancer, such as lung cancer, breast cancer, or myeloma. Less common causes of hypercalcemia include:   Getting too much calcium or vitamin D from your diet.  Kidney failure.  Hyperthyroidism.  Being on bed rest for a long time.  Certain medicines.  Infections.  Sarcoidosis. RISK FACTORS This condition is more likely to develop in:  Women.  People who are 60 years or older.  People who have a family history of hypercalcemia. SYMPTOMS  Mild hypercalcemia that starts slowly may not cause symptoms. Severe, sudden hypercalcemia is more likely to cause symptoms, such as:  Loss of appetite.  Increased thirst and frequent urination.  Fatigue.  Nausea and vomiting.  Headache.  Abdominal pain.  Muscle pain, twitching, or weakness.  Constipation.  Blood in the urine.  Pain in the side of the back (flank pain).  Anxiety, confusion, or depression.  Irregular heartbeat (arrhythmia).  Loss of consciousness. DIAGNOSIS  This condition may be diagnosed based on:   Your symptoms.  Blood tests.  Urine  tests.  X-rays.  Ultrasound.  MRI.  CT scan. TREATMENT  Treatment for hypercalcemia depends on the cause. Treatment may include:  Receiving fluids through an IV tube.  Medicines that keep calcium levels steady after receiving fluids (loop diuretics).  Medicines that keep calcium in your bones (bisphosphonates).  Medicines that lower the calcium level in your blood.  Surgery to remove overactive parathyroid glands. HOME CARE INSTRUCTIONS  Take over-the-counter and prescription medicines only as told by your health care provider.  Follow instructions from your health care provider about eating or drinking restrictions.  Drink enough fluid to keep your urine clear or pale yellow.  Stay active. Weight-bearing exercise helps to keep calcium in your bones. Follow instructions from your health care provider about what type and level of exercise is safe for you.  Keep all follow-up visits as told by your health care provider. This is important. SEEK MEDICAL CARE IF:  You have a fever.  You have flank or abdominal pain that is getting worse. SEEK IMMEDIATE MEDICAL CARE IF:   You have severe abdominal or flank pain.  You have chest pain.  You have trouble breathing.  You become very confused and sleepy.  You lose consciousness.   This information is not intended to replace advice given to you by your health care provider. Make sure you discuss any questions you have with your health care provider.   Document Released: 07/18/2004 Document Revised: 01/23/2015 Document Reviewed: 09/19/2014 Elsevier Interactive Patient Education 2016 Elsevier Inc.  

## 2016-03-24 NOTE — Evaluation (Signed)
Occupational Therapy Evaluation Patient Details Name: [name] A Blue MRN: [id number] DOB: Mar 19, 1946 Today's Date: 03/24/2016    History of Present Illness [name] A Eskelson is a 70 y.o. female with hypertension, COPD, ongoing tobacco abuse, low back pain and anxiety was brought to the ER after patient was found to be confused by patient's family   Clinical Impression   Pt is moving well and able to perform self care at a supervision level for safety. She scored 12/30 (26 being WNL) on the MoCA with impairments noted in executive functioning, naming, memory, language and orientation. Pt will need HHOT to address IADL, to instruct in compensatory strategies and to reassess cognition.    Follow Up Recommendations  Home health OT;Supervision/Assistance - 24 hour    Equipment Recommendations  None recommended by OT    Recommendations for Other Services       Precautions / Restrictions Precautions Precaution Comments: Pt still a little confused.  She states the doctors told her it would take days yet for her to "get it out of my system" Restrictions Weight Bearing Restrictions: No      Mobility Bed Mobility Overal bed mobility: Modified Independent                Transfers Overall transfer level: Modified independent                    Balance Overall balance assessment: Needs assistance Sitting-balance support: No upper extremity supported Sitting balance-Leahy Scale: Good       Standing balance-Leahy Scale: Good                   Standardized Balance Assessment Standardized Balance Assessment : Dynamic Gait Index        ADL Overall ADL's : Needs assistance/impaired                                     Functional mobility during ADLs: Independent General ADL Comments: Supervision for safety. Pt able to state she would call 911 in an emergency. Agreeable to avoid using stove and to allow family to assist with meds and  transportation. Educated in compensatory strategies for memory deficits. Pt concerned she may have dementia as her mother had it.     Vision Additional Comments: glasses were lost    Perception     Praxis      Pertinent Vitals/Pain Pain Assessment: No/denies pain     Hand Dominance Right   Extremity/Trunk Assessment Upper Extremity Assessment Upper Extremity Assessment: Overall WFL for tasks assessed   Lower Extremity Assessment Lower Extremity Assessment: Overall WFL for tasks assessed       Communication Communication Communication: No difficulties   Cognition Arousal/Alertness: Awake/alert Behavior During Therapy: WFL for tasks assessed/performed Overall Cognitive Status: Impaired/Different from baseline Area of Impairment: Orientation;Memory;Safety/judgement;Problem solving;Awareness Orientation Level: Time;Place   Memory: Decreased short-term memory   Safety/Judgement: Decreased awareness of deficits Awareness: Emergent Problem Solving: Slow processing;Difficulty sequencing General Comments: Pt scored 11/30 on MoCA.   General Comments       Exercises       Shoulder Instructions      Home Living Family/patient expects to be discharged to:: Private residence Living Arrangements: Alone Available Help at Discharge: Family;Available 24 hours/day Type of Home: Apartment Home Access: Level entry     Home Layout: One level     Bathroom Shower/Tub: Tub/shower unit;Curtain  Bathroom Toilet: Standard Bathroom Accessibility: Yes   Home Equipment: None;Shower seat   Additional Comments: drives,       Prior Functioning/Environment Level of Independence: Independent        Comments: plans to go home with daughter and grandson        OT Problem List: Decreased cognition   OT Treatment/Interventions:      OT Goals(Current goals can be found in the care plan section) Acute Rehab OT Goals Patient Stated Goal: return home OT Goal Formulation:  With patient  OT Frequency:     Barriers to D/C:            Co-evaluation              End of Session    Activity Tolerance: Patient tolerated treatment well Patient left: in chair;with call bell/phone within reach;with nursing/sitter in room   Time: 1105-1135 OT Time Calculation (min): 30 min Charges:  OT General Charges $OT Visit: 1 Procedure OT Evaluation $OT Eval Moderate Complexity: 1 Procedure OT Treatments $Cognitive Skills Development: 8-22 mins G-Codes:    Evern BioMayberry, Joyelle Siedlecki Lynn 03/24/2016, 1:20 PM  [phone]

## 2016-03-24 NOTE — Progress Notes (Signed)
[name] to be D/C'd Home per MD order.  Discussed prescriptions and follow up appointments with the patient. Prescriptions given to patient, medication list explained in detail. Pt verbalized understanding.    Medication List    STOP taking these medications   ALPRAZolam 0.25 MG tablet Commonly known as:  XANAX   furosemide 40 MG tablet Commonly known as:  LASIX   gabapentin 300 MG capsule Commonly known as:  NEURONTIN   montelukast 10 MG tablet Commonly known as:  SINGULAIR     TAKE these medications   ADVAIR DISKUS 250-50 MCG/DOSE Aepb Generic drug:  Fluticasone-Salmeterol Inhale 1 puff into the lungs 2 (two) times daily.   albuterol 108 (90 Base) MCG/ACT inhaler Commonly known as:  PROVENTIL HFA;VENTOLIN HFA Inhale 2 puffs into the lungs every 6 (six) hours as needed for wheezing or shortness of breath.   cetirizine 10 MG tablet Commonly known as:  ZYRTEC Take 10 mg by mouth daily.   diclofenac sodium 1 % Gel Commonly known as:  VOLTAREN Apply 1 application topically 3 (three) times daily. Feet and knees   diltiazem 240 MG 24 hr capsule Commonly known as:  CARDIZEM CD Take 240 mg by mouth daily.   losartan 50 MG tablet Commonly known as:  COZAAR Take 1 tablet (50 mg total) by mouth daily.   metoprolol 50 MG tablet Commonly known as:  LOPRESSOR Take 50 mg by mouth 2 (two) times daily.   oxyCODONE-acetaminophen 7.5-325 MG tablet Commonly known as:  PERCOCET Take 1 tablet by mouth every 6 (six) hours as needed for severe pain.   rosuvastatin 10 MG tablet Commonly known as:  CRESTOR Take 10 mg by mouth daily.   sennosides-docusate sodium 8.6-50 MG tablet Commonly known as:  SENOKOT-S Take 2 tablets by mouth 2 (two) times daily.   thiamine 100 MG tablet Take 1 tablet (100 mg total) by mouth daily.   tiotropium 18 MCG inhalation capsule Commonly known as:  SPIRIVA Place 18 mcg into inhaler and inhale daily.       Vitals:   03/24/16 0500  03/24/16 1100  BP:  (!) 154/92  Pulse:  61  Resp:    Temp: 98.9 F (37.2 C) 98.4 F (36.9 C)    Skin clean, dry and intact without evidence of skin break down, no evidence of skin tears noted. IV catheter discontinued intact. Site without signs and symptoms of complications. Dressing and pressure applied. Pt denies pain at this time. No complaints noted.  An After Visit Summary was printed and given to the patient. Patient escorted via WC, and D/C home via private auto.  Mariann BarterKellie Jagger Beahm BSN, RN [facility] Phone [phone]

## 2016-03-24 NOTE — Evaluation (Signed)
Physical Therapy Evaluation Patient Details Name: [name] MRN: [id number] DOB: [birth year] Today's Date: 03/24/2016   History of Present Illness  [name] is a 70 y.o. female with hypertension, COPD, ongoing tobacco abuse, low back pain and anxiety was brought to the ER after patient was found to be confused by patient's family  Clinical Impression   Pt is at or close to baseline moblitiy functioning and should be safe at home with family completing more complex tasks at this time. There are no further acute PT needs.  Will sign off at this time.      Follow Up Recommendations No PT follow up    Equipment Recommendations  None recommended by PT    Recommendations for Other Services       Precautions / Restrictions Precautions Precaution Comments: Pt still a little confused.  She states the doctors told her it would take days yet for her to "get it out of my system" Restrictions Weight Bearing Restrictions: No      Mobility  Bed Mobility Overal bed mobility: Modified Independent                Transfers Overall transfer level: Modified independent                  Ambulation/Gait Ambulation/Gait assistance: Independent Ambulation Distance (Feet): 800 Feet Assistive device: None Gait Pattern/deviations: Step-through pattern   Gait velocity interpretation: >2.62 ft/sec, indicative of independent community ambulator General Gait Details: steady and able to accept challenge to balance with significant deviation  Stairs Stairs: Yes Stairs assistance: Modified independent (Device/Increase time) Stair Management: One rail Right;One rail Left;Alternating pattern;Forwards Number of Stairs: 4 General stair comments: generally steady, needed the rail for safety  Wheelchair Mobility    Modified Rankin (Stroke Patients Only)       Balance Overall balance assessment: Needs assistance Sitting-balance support: No upper extremity  supported Sitting balance-Leahy Scale: Good       Standing balance-Leahy Scale: Good                   Standardized Balance Assessment Standardized Balance Assessment : Dynamic Gait Index   Dynamic Gait Index Level Surface: Normal Change in Gait Speed: Normal Gait with Horizontal Head Turns: Normal Gait with Vertical Head Turns: Normal Gait and Pivot Turn: Normal Step Over Obstacle: Normal Step Around Obstacles: Normal Steps: Mild Impairment Total Score: 23       Pertinent Vitals/Pain Pain Assessment: No/denies pain    Home Living Family/patient expects to be discharged to:: Private residence   Available Help at Discharge: Family;Available PRN/intermittently Type of Home: Apartment Home Access: Level entry     Home Layout: One level Home Equipment: None;Shower seat Additional Comments: drives,     Prior Function Level of Independence: Independent         Comments: Goes to church, enjoys reading the Bible and watching TV     Hand Dominance   Dominant Hand: Right    Extremity/Trunk Assessment   Upper Extremity Assessment: Defer to OT evaluation           Lower Extremity Assessment: Overall WFL for tasks assessed (proximal weaknesses and trunk;  weaker gross extension)         Communication   Communication: No difficulties  Cognition Arousal/Alertness: Awake/alert Behavior During Therapy: WFL for tasks assessed/performed Overall Cognitive Status: Impaired/Different from baseline Area of Impairment: Orientation;Memory;Awareness;Problem solving Orientation Level: Time;Place   Memory: Decreased short-term memory     Awareness:  Emergent Problem Solving: Slow processing      General Comments General comments (skin integrity, edema, etc.): low risk of fall, but not sure of challenges to mentation.  OT to complete a MOCA    Exercises     Assessment/Plan    PT Assessment Patent does not need any further PT services  PT Problem List  Decreased cognition;Decreased safety awareness;Decreased knowledge of precautions          PT Treatment Interventions      PT Goals (Current goals can be found in the Care Plan section)  Acute Rehab PT Goals PT Goal Formulation: All assessment and education complete, DC therapy    Frequency     Barriers to discharge   Her assist/supervision with likely be PRN    Co-evaluation               End of Session   Activity Tolerance: Patient tolerated treatment well Patient left: in chair;with call bell/phone within reach;Other (comment) (With OT) Nurse Communication: Mobility status         Time: 1037-1100 PT Time Calculation (min) (ACUTE ONLY): 23 min   Charges:   PT Evaluation $PT Eval Low Complexity: 1 Procedure PT Treatments $Gait Training: 8-22 mins   PT G Codes:        Chistopher Mangino, Eliseo GumKenneth V 03/24/2016, 11:17 AM 03/24/2016  Lanier BingKen Saben Donigan, PT [phone]  (pager)

## 2016-04-02 ENCOUNTER — Encounter: Payer: Self-pay | Admitting: Podiatry

## 2016-04-02 ENCOUNTER — Ambulatory Visit (INDEPENDENT_AMBULATORY_CARE_PROVIDER_SITE_OTHER): Payer: Medicare Other | Admitting: Podiatry

## 2016-04-02 VITALS — BP 125/82 | HR 67 | Resp 14 | Ht 62.5 in | Wt 113.0 lb

## 2016-04-02 DIAGNOSIS — Q828 Other specified congenital malformations of skin: Secondary | ICD-10-CM

## 2016-04-02 DIAGNOSIS — B351 Tinea unguium: Secondary | ICD-10-CM

## 2016-04-02 DIAGNOSIS — M79676 Pain in unspecified toe(s): Secondary | ICD-10-CM

## 2016-04-02 NOTE — Progress Notes (Signed)
   Subjective:    Patient ID: [name], female    DOB: 10-08-1945, 70 y.o.   MRN: [id number]  HPI This patient presents the office with chief complaint of painful forefoot pain. She says that her nails have become thick and painful and or even painful from the sheets in her bed. She says she has painful callus on both big toes as well as a painful callus under the ball of her right foot. She states that she has difficulty walking and wearing her shoes. Patient is not diabetic. She presents the office today for an evaluation and treatment.   Review of Systems  Constitutional: Positive for activity change, appetite change, chills, fatigue, fever and unexpected weight change.  HENT: Positive for sinus pain.        Objective:   Physical Exam GENERAL APPEARANCE: Alert, conversant. Appropriately groomed. No acute distress.  VASCULAR: Pedal pulses are  palpable at  [facility] and [facility] bilateral.  Capillary refill time is immediate to all digits,  Normal temperature gradient.  Digital hair growth is present bilateral  NEUROLOGIC: sensation is normal to 5.07 monofilament at 5/5 sites bilateral.  Light touch is intact bilateral, Muscle strength normal.  MUSCULOSKELETAL: acceptable muscle strength, tone and stability bilateral.  Intrinsic muscluature intact bilateral.  Rectus appearance of foot and digits noted bilateral. HAV  B/L.  DERMATOLOGIC: skin color, texture, and turgor are within normal limits.  No preulcerative lesions or ulcers  are seen, no interdigital maceration noted.  No open lesions present.  No drainage noted. Pinch callus  B/L.  Porokeratosis  Sub 2 righ  NAILS  Thick disfigured discolored nails both feet.          Assessment & Plan:  Onychomycosis  Porokeratosis right foot.  Pinch callus  B/L  IE  Debride nails.  Debride porokeratosis   RTC 12 weeks.   Helane GuntherGregory Mayer DPM

## 2016-04-03 ENCOUNTER — Other Ambulatory Visit: Payer: Self-pay | Admitting: Obstetrics & Gynecology

## 2016-04-06 ENCOUNTER — Telehealth: Payer: Self-pay | Admitting: Physical Medicine & Rehabilitation

## 2016-04-06 NOTE — Telephone Encounter (Signed)
patient cancelled her appt in december stating that she has decided not to come it and wants to speak to a nurse about it.

## 2016-04-06 NOTE — Telephone Encounter (Signed)
[name] is not going to return to see Dr Riley KillSwartz unless something comes up and she needs him.  She has cancelled her appt and is requesting a letter. Printed.

## 2016-04-22 ENCOUNTER — Ambulatory Visit: Payer: Medicare Other | Admitting: Physical Medicine & Rehabilitation

## 2016-04-27 ENCOUNTER — Other Ambulatory Visit: Payer: Self-pay | Admitting: Obstetrics & Gynecology

## 2016-06-16 ENCOUNTER — Encounter (HOSPITAL_COMMUNITY): Payer: Self-pay | Admitting: Emergency Medicine

## 2016-06-16 ENCOUNTER — Emergency Department (HOSPITAL_COMMUNITY)
Admission: EM | Admit: 2016-06-16 | Discharge: 2016-06-16 | Disposition: A | Discharge status: Home / Self Care | Payer: Medicare Other | Attending: Emergency Medicine | Admitting: Emergency Medicine

## 2016-06-16 ENCOUNTER — Emergency Department (HOSPITAL_COMMUNITY): Payer: Medicare Other

## 2016-06-16 DIAGNOSIS — N189 Chronic kidney disease, unspecified: Secondary | ICD-10-CM | POA: Insufficient documentation

## 2016-06-16 DIAGNOSIS — W540XXA Bitten by dog, initial encounter: Secondary | ICD-10-CM | POA: Insufficient documentation

## 2016-06-16 DIAGNOSIS — Y999 Unspecified external cause status: Secondary | ICD-10-CM | POA: Diagnosis not present

## 2016-06-16 DIAGNOSIS — F17210 Nicotine dependence, cigarettes, uncomplicated: Secondary | ICD-10-CM | POA: Insufficient documentation

## 2016-06-16 DIAGNOSIS — S8011XA Contusion of right lower leg, initial encounter: Secondary | ICD-10-CM | POA: Diagnosis not present

## 2016-06-16 DIAGNOSIS — J449 Chronic obstructive pulmonary disease, unspecified: Secondary | ICD-10-CM | POA: Insufficient documentation

## 2016-06-16 DIAGNOSIS — Y929 Unspecified place or not applicable: Secondary | ICD-10-CM | POA: Diagnosis not present

## 2016-06-16 DIAGNOSIS — I129 Hypertensive chronic kidney disease with stage 1 through stage 4 chronic kidney disease, or unspecified chronic kidney disease: Secondary | ICD-10-CM | POA: Insufficient documentation

## 2016-06-16 DIAGNOSIS — Y939 Activity, unspecified: Secondary | ICD-10-CM | POA: Diagnosis not present

## 2016-06-16 DIAGNOSIS — S8991XA Unspecified injury of right lower leg, initial encounter: Secondary | ICD-10-CM | POA: Diagnosis present

## 2016-06-16 MED ORDER — ACETAMINOPHEN 325 MG PO TABS
650.0000 mg | ORAL_TABLET | Freq: Once | ORAL | Status: AC
  Administered 2016-06-16: 650 mg via ORAL
  Filled 2016-06-16: qty 2

## 2016-06-16 MED ORDER — IBUPROFEN 200 MG PO TABS
400.0000 mg | ORAL_TABLET | Freq: Once | ORAL | Status: AC
  Administered 2016-06-16: 400 mg via ORAL
  Filled 2016-06-16: qty 2

## 2016-06-16 NOTE — ED Provider Notes (Signed)
[facility]-EMERGENCY DEPT Provider Note   CSN: [id number] Arrival date & time: 06/16/16  0334     History   Chief Complaint Chief Complaint  Patient presents with  . Leg Pain    HPI [name] is a 71 y.o. female.  Patient complaining of right lower leg pain after injury. She reports that she was bitten by a dog a couple of days ago. There was no broken skin, so she did not seek care initially. She has, however, noticed some bruising now in the area hurts when she walks.      Past Medical History:  Diagnosis Date  . ALLERGIC RHINITIS   . Arthritis   . ASTHMA, UNSPECIFIED, UNSPECIFIED STATUS   . DYSLIPIDEMIA   . Headache(784.0)   . HYPERTENSION   . Hypertension   . Mild renal insufficiency   . PNA (pneumonia)   . Shortness of breath dyspnea   . SPINAL STENOSIS, LUMBAR    MRI 12/2005; surg 2004  . TOBACCO ABUSE     Patient Active Problem List   Diagnosis Date Noted  . Elevated troponin   . Altered mental status 03/19/2016  . Acute on chronic renal failure (HCC) 01/13/2016  . Neuropathic pain   . Anxiety state   . Sleep disturbance   . Post-operative pain   . Spondylolisthesis of lumbar region 01/02/2016  . Surgery, elective   . Chronic obstructive pulmonary disease (HCC)   . Respiratory failure with hypercapnia (HCC)   . Chronic back pain   . Postoperative anemia due to acute blood loss   . Tachycardia   . Leukocytosis   . Thrombocytopenia (HCC)   . Lumbosacral spondylosis with radiculopathy 12/30/2015  . Acute encephalopathy   . AKI (acute kidney injury) (HCC)   . CKD (chronic kidney disease)   . HEADACHE 08/16/2009  . DYSLIPIDEMIA 02/20/2009  . TOBACCO ABUSE 02/20/2009  . Essential hypertension 02/20/2009  . ALLERGIC RHINITIS 02/20/2009  . Asthma 02/20/2009  . SHOULDER PAIN, RIGHT, CHRONIC 02/20/2009  . HIP PAIN, RIGHT, CHRONIC 02/20/2009  . SPINAL STENOSIS, CERVICAL 02/20/2009  . SPINAL STENOSIS, LUMBAR 02/20/2009  . BACK PAIN, LUMBAR  02/20/2009    Past Surgical History:  Procedure Laterality Date  . ANTERIOR LAT LUMBAR FUSION Right 12/30/2015   Procedure: Lumbar two-three, Lumbar three-four Anterior Lateral Lumbar Interbody Fusion with Anterior column realignment at Lumbar three-four;  Surgeon: [name], MD;  Location: [facility];  Service: Neurosurgery;  Laterality: Right;  . Closure of bronchopleural fistula, right lower lobe.    [name] COLONOSCOPY    . Decompressive Laminectomy     Fusion L4-5, L5-S1 (Cram-2004)  . Decortication of right lower lobe    . Placement of wound On-Q analgesia irrigation system.    [name] POSTERIOR LUMBAR FUSION 4 LEVEL N/A 12/30/2015   Procedure: Lumbar two -Sacral one Dorsal Internal Fixation and Fusion, [name] osteotomies Lumbar two-three, Lumbar three-four;  Surgeon: [name], MD;  Location: [facility];  Service: Neurosurgery;  Laterality: N/A;  . Right hand  1995  . Right knee  1990  . Right video-assisted thoracoscopic surgery, drainage of empyema.  02/12/2011    OB History    Gravida Para Term Preterm AB Living   5 4 4   1 4    SAB TAB Ectopic Multiple Live Births     1             Home Medications    Prior to Admission medications   Medication  Sig Start Date End Date Taking? Authorizing Provider  albuterol (PROVENTIL HFA;VENTOLIN HFA) 108 (90 BASE) MCG/ACT inhaler Inhale 2 puffs into the lungs every 6 (six) hours as needed for wheezing or shortness of breath.     Historical Provider, MD  cetirizine (ZYRTEC) 10 MG tablet Take 10 mg by mouth daily.    Historical Provider, MD  diclofenac sodium (VOLTAREN) 1 % GEL Apply 1 application topically 3 (three) times daily. Feet and knees Patient not taking: Reported on 04/02/2016 02/28/16   Ranelle Oyster, MD  diltiazem (CARDIZEM CD) 240 MG 24 hr capsule Take 240 mg by mouth daily.     Historical Provider, MD  fluconazole (DIFLUCAN) 150 MG tablet take 1 tablet by mouth AS A SINGLE DOSE 04/27/16   Tereso Newcomer, MD  Fluticasone-Salmeterol (ADVAIR DISKUS) 250-50 MCG/DOSE AEPB Inhale 1 puff into the lungs 2 (two) times daily.      Historical Provider, MD  losartan (COZAAR) 50 MG tablet Take 1 tablet (50 mg total) by mouth daily. 02/26/16   Ranelle Oyster, MD  metoprolol (LOPRESSOR) 50 MG tablet Take 50 mg by mouth 2 (two) times daily.    Historical Provider, MD  oxyCODONE-acetaminophen (PERCOCET) 7.5-325 MG tablet Take 1 tablet by mouth every 6 (six) hours as needed for severe pain.    Historical Provider, MD  rosuvastatin (CRESTOR) 10 MG tablet Take 10 mg by mouth daily.     Historical Provider, MD  sennosides-docusate sodium (SENOKOT-S) 8.6-50 MG tablet Take 2 tablets by mouth 2 (two) times daily.    Historical Provider, MD  thiamine 100 MG tablet Take 1 tablet (100 mg total) by mouth daily. 03/23/16   Osvaldo Shipper, MD  tiotropium (SPIRIVA) 18 MCG inhalation capsule Place 18 mcg into inhaler and inhale daily.      Historical Provider, MD    Family History Family History  Problem Relation Age of Onset  . Mental illness Mother   . Hypertension Mother   . Arthritis Other   . Hypertension Other     Social History Social History  Substance Use Topics  . Smoking status: Current Every Day Smoker    Packs/day: 0.25    Years: 40.00    Types: Cigarettes  . 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 Alcohol use No     Allergies   Shrimp [shellfish allergy]   Review of Systems Review of Systems  Musculoskeletal: Positive for myalgias.  All other systems reviewed and are negative.    Physical Exam Updated Vital Signs BP 146/96 (BP Location: Left Arm)   Pulse 110   Temp 97.9 F (36.6 C) (Oral)   Resp 20   Ht 5\' 7"  (1.702 m)   Wt 115 lb (52.2 kg)   SpO2 98%   BMI 18.01 kg/m   Physical Exam  Constitutional: She is oriented to person, place, and time. She appears well-developed and well-nourished. No  distress.  HENT:  Head: Normocephalic and atraumatic.  Right Ear: Hearing normal.  Left Ear: Hearing normal.  Nose: Nose normal.  Mouth/Throat: Oropharynx is clear and moist and mucous membranes are normal.  Eyes: Conjunctivae and EOM are normal. Pupils are equal, round, and reactive to light.  Neck: Normal range of motion. Neck supple.  Cardiovascular: Regular rhythm, S1 normal and S2 normal.  Exam reveals no gallop and no friction rub.   No murmur heard. Pulmonary/Chest: Effort normal and breath  sounds normal. No respiratory distress. She exhibits no tenderness.  Abdominal: Soft. Normal appearance and bowel sounds are normal. There is no hepatosplenomegaly. There is no tenderness. There is no rebound, no guarding, no tenderness at McBurney's point and negative Murphy's sign. No hernia.  Musculoskeletal: Normal range of motion.       Right lower leg: She exhibits tenderness (Calf area).  Tenderness in the right calf without any noted swelling, skin breakdown or signs of infection  Neurological: She is alert and oriented to person, place, and time. She has normal strength. No cranial nerve deficit or sensory deficit. Coordination normal. GCS eye subscore is 4. GCS verbal subscore is 5. GCS motor subscore is 6.  Skin: Skin is warm, dry and intact. No rash noted. No cyanosis.  Psychiatric: She has a normal mood and affect. Her speech is normal and behavior is normal. Thought content normal.  Nursing note and vitals reviewed.    ED Treatments / Results  Labs (all labs ordered are listed, but only abnormal results are displayed) Labs Reviewed - No data to display  EKG  EKG Interpretation None       Radiology No results found.  Procedures Procedures (including critical care time)  Medications Ordered in ED Medications  acetaminophen (TYLENOL) tablet 650 mg (650 mg Oral Given 06/16/16 0503)  ibuprofen (ADVIL,MOTRIN) tablet 400 mg (400 mg Oral Given 06/16/16 0503)     Initial  Impression / Assessment and Plan / ED Course  I have reviewed the triage vital signs and the nursing notes.  Pertinent labs & imaging results that were available during my care of the patient were reviewed by me and considered in my medical decision making (see chart for details).     Patient reports pain in her right leg after being bitten by a dog. Examination of the area does not reveal any evidence of laceration from the bite. She is tender in the region but there is no swelling. She feels that there is bruising present but I do not see any significant bruising. This is consistent with minor soft tissue injury, treat with rest and analgesia.  Final Clinical Impressions(s) / ED Diagnoses   Final diagnoses:  Contusion of right lower leg, initial encounter    New Prescriptions New Prescriptions   No medications on file     Gilda Creasehristopher J Aloys Hupfer, MD 06/16/16 0510

## 2016-06-16 NOTE — ED Notes (Signed)
Assessment of back of legs yields no marked difference.

## 2016-06-16 NOTE — ED Triage Notes (Signed)
Pt reports that she had a dog bite the back of her right leg on 06/14/16 and now is having increasing pain. On exam there is no puncture wounds noted. Pt states that she wants an xray of leg.

## 2016-06-16 NOTE — ED Notes (Signed)
Bed: [id number] Expected date:  Expected time:  Means of arrival:  Comments: Changing ( then dog bite )

## 2016-06-25 ENCOUNTER — Ambulatory Visit: Payer: Medicare Other | Admitting: Podiatry

## 2016-07-28 ENCOUNTER — Ambulatory Visit (INDEPENDENT_AMBULATORY_CARE_PROVIDER_SITE_OTHER): Payer: Medicare Other | Admitting: Neurology

## 2016-07-28 ENCOUNTER — Encounter: Payer: Self-pay | Admitting: Neurology

## 2016-07-28 VITALS — BP 142/86 | HR 65 | Ht 65.0 in | Wt 125.6 lb

## 2016-07-28 DIAGNOSIS — R4189 Other symptoms and signs involving cognitive functions and awareness: Secondary | ICD-10-CM

## 2016-07-28 DIAGNOSIS — F17200 Nicotine dependence, unspecified, uncomplicated: Secondary | ICD-10-CM

## 2016-07-28 DIAGNOSIS — I10 Essential (primary) hypertension: Secondary | ICD-10-CM
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
## 2016-07-28 NOTE — Patient Instructions (Signed)
1.  We will check another MRI of the brain 2.  Afterwards, I want you to get neurocognitive testing here at my office and then follow up with me  3.  No driving until your follow up with me.

## 2016-08-06 ENCOUNTER — Ambulatory Visit
Admission: RE | Admit: 2016-08-06 | Discharge: 2016-08-06 | Disposition: A | Discharge status: Home / Self Care | Payer: Medicare Other | Source: Ambulatory Visit | Attending: Neurology | Admitting: Neurology

## 2016-08-06 DIAGNOSIS — R4189 Other symptoms and signs involving cognitive functions and awareness: Secondary | ICD-10-CM

## 2016-08-18 ENCOUNTER — Telehealth: Payer: Self-pay

## 2016-08-18 DIAGNOSIS — D696 Thrombocytopenia, unspecified: Secondary | ICD-10-CM

## 2016-08-18 DIAGNOSIS — I10 Essential (primary) hypertension: Secondary | ICD-10-CM

## 2016-08-18 DIAGNOSIS — I639 Cerebral infarction, unspecified: Secondary | ICD-10-CM

## 2016-08-18 NOTE — Telephone Encounter (Signed)
Called patient. Gave results and instructions. Patient seemed confused. Spoke to daughter Cerullo. Gave results and instructions. Will speak w/ pt and sister. Aware to call this office and schedule neuropsych testing and lipid panel. Advised [facility] will be calling to schedule other orders. Daughter verbalized understanding.

## 2016-08-18 NOTE — Telephone Encounter (Signed)
-----   Message from Drema Dallas, DO sent at 08/18/2016  2:54 PM EDT ----- The MRI of the brain shows some small strokes, which could contribute to memory problems.  I would like to set her up for neuropsychological testing to further evaluate memory problems. Given these MRI findings, we need to order more tests to look for a cause of stroke.  I would like to order MRA of head, carotid doppler, 2D echo with bubble study, fasting lipid panel and 24 hour Holter monitor. In the meantime, she should start taking an aspirin  daily.  She should continue her Crestor. Follow up after neuropsychological testing.

## 2016-08-21 ENCOUNTER — Encounter: Payer: Self-pay | Admitting: Psychology

## 2016-08-27 ENCOUNTER — Ambulatory Visit (INDEPENDENT_AMBULATORY_CARE_PROVIDER_SITE_OTHER): Payer: Medicare Other

## 2016-08-27 ENCOUNTER — Other Ambulatory Visit: Payer: Self-pay | Admitting: Neurology

## 2016-08-27 DIAGNOSIS — I639 Cerebral infarction, unspecified: Secondary | ICD-10-CM | POA: Diagnosis not present

## 2016-08-27 DIAGNOSIS — I10 Essential (primary) hypertension: Secondary | ICD-10-CM | POA: Diagnosis not present

## 2016-08-27 DIAGNOSIS — I4891 Unspecified atrial fibrillation: Secondary | ICD-10-CM | POA: Diagnosis not present

## 2016-08-27 DIAGNOSIS — D696 Thrombocytopenia, unspecified: Secondary | ICD-10-CM

## 2016-08-27 DIAGNOSIS — R4189 Other symptoms and signs involving cognitive functions and awareness: Secondary | ICD-10-CM | POA: Diagnosis not present

## 2016-08-28 ENCOUNTER — Telehealth: Payer: Self-pay

## 2016-08-28 NOTE — Telephone Encounter (Signed)
08/28/2016 10:20 Pt presented to the office accompanied by her grandson requesting to speak with someone regarding her experience while having her monitor applied.  Pt stated she became upset when the cardiovascular tech would not stop telling her "she might be bleeding in her brain."  Reviewed with the patient and her grandson why the monitor was applied per Dr Moises Blood notes.  Both verbalized understanding.  Writer reviewed Dr Moises Blood notes, noted order for 2D Echo with bubble study. 2D echo scheduled per Mervin Kung who notified patient of appt.  Jim Like MHA RN CCM

## 2016-08-30 ENCOUNTER — Emergency Department (HOSPITAL_COMMUNITY)
Admission: EM | Admit: 2016-08-30 | Discharge: 2016-08-30 | Disposition: A | Discharge status: Home / Self Care | Payer: Medicare Other | Attending: Emergency Medicine | Admitting: Emergency Medicine

## 2016-08-30 ENCOUNTER — Encounter (HOSPITAL_COMMUNITY): Payer: Self-pay | Admitting: Emergency Medicine

## 2016-08-30 ENCOUNTER — Emergency Department (HOSPITAL_COMMUNITY): Payer: Medicare Other

## 2016-08-30 DIAGNOSIS — F17210 Nicotine dependence, cigarettes, uncomplicated: Secondary | ICD-10-CM | POA: Diagnosis not present

## 2016-08-30 DIAGNOSIS — R0602 Shortness of breath: Secondary | ICD-10-CM | POA: Diagnosis present

## 2016-08-30 DIAGNOSIS — I10 Essential (primary) hypertension: Secondary | ICD-10-CM | POA: Diagnosis not present

## 2016-08-30 DIAGNOSIS — Z79899 Other long term (current) drug therapy: Secondary | ICD-10-CM | POA: Insufficient documentation

## 2016-08-30 DIAGNOSIS — J441 Chronic obstructive pulmonary disease with (acute) exacerbation: Secondary | ICD-10-CM | POA: Diagnosis not present

## 2016-08-30 LAB — CBC WITH DIFFERENTIAL/PLATELET
BASOS PCT: 0 %
Basophils Absolute: 0 10*3/uL (ref 0.0–0.1)
EOS ABS: 0.3 10*3/uL (ref 0.0–0.7)
Eosinophils Relative: 3 %
HCT: 39.8 % (ref 36.0–46.0)
Hemoglobin: 13.1 g/dL (ref 12.0–15.0)
Lymphocytes Relative: 26 %
Lymphs Abs: 2.3 10*3/uL (ref 0.7–4.0)
MCH: 30.8 pg (ref 26.0–34.0)
MCHC: 32.9 g/dL (ref 30.0–36.0)
MCV: 93.6 fL (ref 78.0–100.0)
MONO ABS: 0.4 10*3/uL (ref 0.1–1.0)
MONOS PCT: 5 %
Neutro Abs: 5.8 10*3/uL (ref 1.7–7.7)
Neutrophils Relative %: 66 %
Platelets: 185 10*3/uL (ref 150–400)
RBC: 4.25 MIL/uL (ref 3.87–5.11)
RDW: 15.6 % — AB (ref 11.5–15.5)
WBC: 8.8 10*3/uL (ref 4.0–10.5)

## 2016-08-30 LAB — COMPREHENSIVE METABOLIC PANEL
ALBUMIN: 3.4 g/dL — AB (ref 3.5–5.0)
ALK PHOS: 51 U/L (ref 38–126)
ALT: 11 U/L — ABNORMAL LOW (ref 14–54)
ANION GAP: 10 (ref 5–15)
AST: 20 U/L (ref 15–41)
BUN: 20 mg/dL (ref 6–20)
CALCIUM: 9.9 mg/dL (ref 8.9–10.3)
CO2: 24 mmol/L (ref 22–32)
Chloride: 110 mmol/L (ref 101–111)
Creatinine, Ser: 1.43 mg/dL — ABNORMAL HIGH (ref 0.44–1.00)
GFR calc Af Amer: 42 mL/min — ABNORMAL LOW (ref >60)
GFR calc non Af Amer: 36 mL/min — ABNORMAL LOW (ref >60)
GLUCOSE: 90 mg/dL (ref 65–99)
Potassium: 3.9 mmol/L (ref 3.5–5.1)
SODIUM: 144 mmol/L (ref 135–145)
Total Bilirubin: 0.3 mg/dL (ref 0.3–1.2)
Total Protein: 6.5 g/dL (ref 6.5–8.1)

## 2016-08-30 LAB — BRAIN NATRIURETIC PEPTIDE: B Natriuretic Peptide: 13.9 pg/mL (ref 0.0–100.0)

## 2016-08-30 LAB — I-STAT TROPONIN, ED: TROPONIN I, POC: 0.03 ng/mL (ref 0.00–0.08)

## 2016-08-30 MED ORDER — PREDNISONE 20 MG PO TABS
60.0000 mg | ORAL_TABLET | Freq: Once | ORAL | Status: AC
  Administered 2016-08-30: 60 mg via ORAL
  Filled 2016-08-30: qty 3

## 2016-08-30 MED ORDER — PREDNISONE 10 MG PO TABS: 50.0000 mg | ORAL_TABLET | Freq: Every day | ORAL | 0 refills | Status: DC

## 2016-08-30 MED ORDER — ALBUTEROL SULFATE HFA 108 (90 BASE) MCG/ACT IN AERS
2.0000 | INHALATION_SPRAY | Freq: Once | RESPIRATORY_TRACT | Status: AC
  Administered 2016-08-30: 2 via RESPIRATORY_TRACT
  Filled 2016-08-30: qty 6.7

## 2016-08-30 MED ORDER — ACETAMINOPHEN 500 MG PO TABS
1000.0000 mg | ORAL_TABLET | Freq: Once | ORAL | Status: DC
  Filled 2016-08-30: qty 2

## 2016-08-30 MED ORDER — IPRATROPIUM-ALBUTEROL 0.5-2.5 (3) MG/3ML IN SOLN
3.0000 mL | Freq: Once | RESPIRATORY_TRACT | Status: AC
  Administered 2016-08-30: 3 mL via RESPIRATORY_TRACT
  Filled 2016-08-30: qty 3

## 2016-08-30 MED ORDER — ALBUTEROL SULFATE HFA 108 (90 BASE) MCG/ACT IN AERS: 2.0000 | INHALATION_SPRAY | RESPIRATORY_TRACT | 2 refills | Status: AC | PRN

## 2016-08-30 MED ORDER — ASPIRIN 81 MG PO CHEW
324.0000 mg | CHEWABLE_TABLET | Freq: Once | ORAL | Status: AC
  Administered 2016-08-30: 324 mg via ORAL
  Filled 2016-08-30: qty 4

## 2016-08-30 NOTE — ED Triage Notes (Signed)
Pt to ED with c/o shortness of breath and chest pain.  Onset this am.

## 2016-08-30 NOTE — ED Notes (Signed)
Patient transported to X-ray 

## 2016-08-30 NOTE — ED Provider Notes (Signed)
[facility] Provider Note   CSN: [id number] Arrival date & time: 08/30/16  1857     History   Chief Complaint Chief Complaint  Patient presents with  . Shortness of Breath    HPI [name] is a 71 y.o. female.  The history is provided by the patient.  Shortness of Breath  This is a new problem. The average episode lasts 2 days. The problem occurs continuously.The current episode started 2 days ago. The problem has not changed since onset.Associated symptoms include headaches, coryza, rhinorrhea, cough, sputum production, wheezing and chest pain (chest tightness). Pertinent negatives include no fever, no PND, no orthopnea, no syncope, no vomiting and no abdominal pain. The problem's precipitants include pollens. She has tried beta-agonist inhalers for the symptoms. The treatment provided mild relief. She has had no prior ICU admissions. Associated medical issues include asthma and COPD.    71 year old female with history of hypertension, hyperlipidemia, COPD, and chronic tobacco use who presents with shortness of breath that has been ongoing for 2 days. Primarily feels this with activity, and used her inhaler yesterday and today without significant improvement. Has had mild cough and congestion but no fevers or chills. Family states that recently she has been exposed to pollens, and her allergies have been flaring up which has been a trigger for her in the past. Feels chest tightness with this. Mild pedal edema noted. No orthopnea or pnd. No recent immbolization  Past Medical History:  Diagnosis Date  . ALLERGIC RHINITIS   . Arthritis   . ASTHMA, UNSPECIFIED, UNSPECIFIED STATUS   . DYSLIPIDEMIA   . Headache(784.0)   . HYPERTENSION   . Hypertension   . Mild renal insufficiency   . PNA (pneumonia)   . Shortness of breath dyspnea   . SPINAL STENOSIS, LUMBAR    MRI 12/2005; surg 2004  . TOBACCO ABUSE     Patient Active Problem List   Diagnosis Date Noted  .  Elevated troponin   . Altered mental status 03/19/2016  . Acute on chronic renal failure (HCC) 01/13/2016  . Neuropathic pain   . Anxiety state   . Sleep disturbance   . Post-operative pain   . Spondylolisthesis of lumbar region 01/02/2016  . Surgery, elective   . Chronic obstructive pulmonary disease (HCC)   . Respiratory failure with hypercapnia (HCC)   . Chronic back pain   . Postoperative anemia due to acute blood loss   . Tachycardia   . Leukocytosis   . Thrombocytopenia (HCC)   . Lumbosacral spondylosis with radiculopathy 12/30/2015  . Acute encephalopathy   . AKI (acute kidney injury) (HCC)   . CKD (chronic kidney disease)   . HEADACHE 08/16/2009  . DYSLIPIDEMIA 02/20/2009  . TOBACCO ABUSE 02/20/2009  . Essential hypertension 02/20/2009  . ALLERGIC RHINITIS 02/20/2009  . Asthma 02/20/2009  . SHOULDER PAIN, RIGHT, CHRONIC 02/20/2009  . HIP PAIN, RIGHT, CHRONIC 02/20/2009  . SPINAL STENOSIS, CERVICAL 02/20/2009  . SPINAL STENOSIS, LUMBAR 02/20/2009  . BACK PAIN, LUMBAR 02/20/2009    Past Surgical History:  Procedure Laterality Date  . ANTERIOR LAT LUMBAR FUSION Right 12/30/2015   Procedure: Lumbar two-three, Lumbar three-four Anterior Lateral Lumbar Interbody Fusion with Anterior column realignment at Lumbar three-four;  Surgeon: [name], MD;  Location: [facility];  Service: Neurosurgery;  Laterality: Right;  . Closure of bronchopleural fistula, right lower lobe.    [name] COLONOSCOPY    . Decompressive Laminectomy     Fusion L4-5, L5-S1 (Cram-2004)  .  Decortication of right lower lobe    . Placement of wound On-Q analgesia irrigation system.    [name] POSTERIOR LUMBAR FUSION 4 LEVEL N/A 12/30/2015   Procedure: Lumbar two -Sacral one Dorsal Internal Fixation and Fusion, [name] osteotomies Lumbar two-three, Lumbar three-four;  Surgeon: [name], MD;  Location: [facility];  Service: Neurosurgery;  Laterality: N/A;  . Right hand  1995  . Right  knee  1990  . Right video-assisted thoracoscopic surgery, drainage of empyema.  02/12/2011    OB History    Gravida Para Term Preterm AB Living   SAB TAB Ectopic Multiple Live Births     1             Home Medications    Prior to Admission medications   Medication Sig Start Date End Date Taking? Authorizing Provider  albuterol (PROVENTIL HFA;VENTOLIN HFA) 108 (90 BASE) MCG/ACT inhaler Inhale 2 puffs into the lungs every 6 (six) hours as needed for wheezing or shortness of breath.     Historical Provider, MD  albuterol (PROVENTIL HFA;VENTOLIN HFA) 108 (90 Base) MCG/ACT inhaler Inhale 2 puffs into the lungs every 4 (four) hours as needed for wheezing or shortness of breath. 08/30/16   Lavera Guise, MD  alendronate (FOSAMAX) 70 MG tablet Take 1 tablet by mouth every Wednesday.  07/14/16   Historical Provider, MD  amLODipine (NORVASC) 5 MG tablet Take 5 mg by mouth daily. 07/08/16   Historical Provider, MD  cetirizine (ZYRTEC) 10 MG tablet Take 10 mg by mouth daily.    Historical Provider, MD  Fluticasone-Salmeterol (ADVAIR DISKUS) 250-50 MCG/DOSE AEPB Inhale 1 puff into the lungs 2 (two) times daily.      Historical Provider, MD  losartan (COZAAR) 100 MG tablet Take 1 tablet by mouth daily. 07/27/16   Historical Provider, MD  metoprolol (LOPRESSOR) 50 MG tablet Take 50 mg by mouth 2 (two) times daily.    Historical Provider, MD  predniSONE (DELTASONE) 10 MG tablet Take 5 tablets (50 mg total) by mouth daily. 08/31/16   Lavera Guise, MD  rosuvastatin (CRESTOR) 10 MG tablet Take 10 mg by mouth daily.     Historical Provider, MD  SINGULAIR 10 MG tablet Take 1 tablet by mouth daily. 07/02/16   Historical Provider, MD  thiamine (VITAMIN B-1) 100 MG tablet Take 100 mg by mouth daily. 04/30/16   Historical Provider, MD  tiotropium (SPIRIVA) 18 MCG inhalation capsule Place 18 mcg into inhaler and inhale daily.      Historical Provider, MD  Vitamin D, Ergocalciferol, (DRISDOL) 50000 units  CAPS capsule Take 50,000 Units by mouth once a week. 07/14/16   Historical Provider, MD    Family History Family History  Problem Relation Age of Onset  . Mental illness Mother   . Hypertension Mother   . Arthritis Other   . Hypertension Other     Social History Social History  Substance Use Topics  . Smoking status: Current Every Day Smoker    Packs/day: 0.25    Years: 40.00    Types: Cigarettes  . 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 Alcohol use No     Allergies   Shrimp [shellfish allergy]   Review of Systems Review of Systems  Constitutional: Negative for fever.  HENT: Positive for congestion and rhinorrhea.   Respiratory: Positive for cough,  sputum production, shortness of breath and wheezing.   Cardiovascular: Positive for chest pain (chest tightness). Negative for orthopnea, syncope and PND.  Gastrointestinal: Negative for abdominal pain and vomiting.  Genitourinary: Negative for difficulty urinating.  Musculoskeletal: Negative for back pain.  Allergic/Immunologic: Negative for immunocompromised state.  Neurological: Positive for headaches. Negative for syncope.  Hematological: Does not bruise/bleed easily.  Psychiatric/Behavioral: Negative for confusion.  All other systems reviewed and are negative.    Physical Exam Updated Vital Signs BP (!) 158/94   Pulse 83   Temp 98.5 F (36.9 C) (Oral)   Resp (!) 22   SpO2 97%   Physical Exam Physical Exam  Nursing note and vitals reviewed. Constitutional: Wnon-toxic, and in no acute distress Head: Normocephalic and atraumatic.  Mouth/Throat: Oropharynx is clear and moist.  Neck: Normal range of motion. Neck supple.  Cardiovascular: Normal rate and regular rhythm.   Pulmonary/Chest: Effort normal and breath sounds with scattered faint expiratory wheezing  Abdominal: Soft. There is no tenderness. There is no rebound and no guarding.    Musculoskeletal: Normal range of motion. no LE edema Neurological: Alert, no facial droop, fluent speech, moves all extremities symmetrically Skin: Skin is warm and dry.  Psychiatric: Cooperative   ED Treatments / Results  Labs (all labs ordered are listed, but only abnormal results are displayed) Labs Reviewed  CBC WITH DIFFERENTIAL/PLATELET - Abnormal; Notable for the following:       Result Value   RDW 15.6 (*)    All other components within normal limits  COMPREHENSIVE METABOLIC PANEL - Abnormal; Notable for the following:    Creatinine, Ser 1.43 (*)    Albumin 3.4 (*)    ALT 11 (*)    GFR calc non Af Amer 36 (*)    GFR calc Af Amer 42 (*)    All other components within normal limits  BRAIN NATRIURETIC PEPTIDE  I-STAT TROPOININ, ED    EKG  EKG Interpretation  Date/Time:  Sunday August 30 2016 19:09:21 EDT Ventricular Rate:  89 PR Interval:  132 QRS Duration: 82 QT Interval:  354 QTC Calculation: 430 R Axis:   38 Text Interpretation:  Normal sinus rhythm Normal ECG wandering baseline grossly similar to prior EKG  Confirmed by Keefer Soulliere MD, Eleanore Junio [phone]) on 08/30/2016 9:11:11 PM       Radiology Dg Chest 2 View  Result Date: 08/30/2016 CLINICAL DATA:  Mid chest pain EXAM: CHEST  2 VIEW COMPARISON:  03/19/2016 FINDINGS: The lungs are hyperinflated. There is pleural scarring at the right lung base. No consolidation. Stable cardiomediastinal silhouette with atherosclerosis. No pneumothorax. IMPRESSION: Hyperinflation.  No acute infiltrate or edema. Electronically Signed   By: Jasmine Pang M.D.   On: 08/30/2016 21:51    Procedures Procedures (including critical care time)  Medications Ordered in ED Medications  predniSONE (DELTASONE) tablet 60 mg (60 mg Oral Given 08/30/16 2143)  ipratropium-albuterol (DUONEB) 0.5-2.5 (3) MG/3ML nebulizer solution 3 mL (3 mLs Nebulization Given 08/30/16 2145)  aspirin chewable tablet 324 mg (324 mg Oral Given 08/30/16 2247)  albuterol  (PROVENTIL HFA;VENTOLIN HFA) 108 (90 Base) MCG/ACT inhaler 2 puff (2 puffs Inhalation Provided for home use 08/30/16 2248)     Initial Impression / Assessment and Plan / ED Course  I have reviewed the triage vital signs and the nursing notes.  Pertinent labs & imaging results that were available during my care of the patient were reviewed by me and considered in my medical decision making (see chart for details).  Presents with DOE 2/2 to pollen exposure that is reported to be similar to prior asthma/COPD exacerbations. Non-toxic and in no acute distress. Speaks in full sentences. Normal oxygenation. Faint wheezes noted. CXR visualized and shows no edema, pneumonia or other acute processes. Given breathing treatment and prednisone and on re-evaluation feels back to baseline. EKg is not ischemic and troponin is normal. Doubt ACS. No concerns at this time for PE. Ambulates with normal work of breathing and normal oxygenation. Requests discharge. Will give Rx for steroids and albuterol. Strict return and follow-up instructions reviewed. She expressed understanding of all discharge instructions and felt comfortable with the plan of care.   Final Clinical Impressions(s) / ED Diagnoses   Final diagnoses:  Acute exacerbation of chronic obstructive pulmonary disease (COPD) (HCC)    New Prescriptions Discharge Medication List as of 08/30/2016 10:46 PM    START taking these medications   Details  !! albuterol (PROVENTIL HFA;VENTOLIN HFA) 108 (90 Base) MCG/ACT inhaler Inhale 2 puffs into the lungs every 4 (four) hours as needed for wheezing or shortness of breath., Starting Sun 08/30/2016, Print    predniSONE (DELTASONE) 10 MG tablet Take 5 tablets (50 mg total) by mouth daily., Starting Mon 08/31/2016, Print     !! - Potential duplicate medications found. Please discuss with provider.       Lavera Guise, MD 08/30/16 [phone]

## 2016-08-30 NOTE — Discharge Instructions (Signed)
Use your albuterol inhaler as rescue inhaler. For the first 24 hours give yourself 6 puffs every 4 hours. Then you can give yourself as needed 2 puffs every 4 hours for shortness of breath. Take your steroids as prescribed.  Continue to take your Advair and Spiriva as prescribed. Only use your albuterol as rescue inhaler.  Return for worsening symptoms, including fever, difficulty breathing, severe chest pain, passing out, or any other symptoms concerning to you.

## 2016-09-03 ENCOUNTER — Telehealth: Payer: Self-pay

## 2016-09-03 NOTE — Telephone Encounter (Signed)
-----   Message from Drema Dallas, DO sent at 09/03/2016 12:40 PM EDT ----- Please let Ms. Grippi know that the holter monitor revealed no arrhythmias

## 2016-09-03 NOTE — Telephone Encounter (Signed)
Called patient.  No answer.

## 2016-09-10 ENCOUNTER — Other Ambulatory Visit (HOSPITAL_COMMUNITY): Payer: Medicare Other

## 2016-09-22 ENCOUNTER — Ambulatory Visit (HOSPITAL_COMMUNITY): Payer: Medicare Other | Attending: Cardiovascular Disease

## 2016-09-22 ENCOUNTER — Other Ambulatory Visit: Payer: Self-pay

## 2016-09-22 DIAGNOSIS — Z720 Tobacco use: Secondary | ICD-10-CM | POA: Insufficient documentation

## 2016-09-22 DIAGNOSIS — I639 Cerebral infarction, unspecified: Secondary | ICD-10-CM | POA: Diagnosis present

## 2016-09-22 DIAGNOSIS — D696 Thrombocytopenia, unspecified: Secondary | ICD-10-CM

## 2016-09-22 DIAGNOSIS — I10 Essential (primary) hypertension: Secondary | ICD-10-CM | POA: Diagnosis not present

## 2016-09-22 DIAGNOSIS — E785 Hyperlipidemia, unspecified: Secondary | ICD-10-CM | POA: Diagnosis not present

## 2016-09-23 ENCOUNTER — Other Ambulatory Visit: Payer: Self-pay | Admitting: Obstetrics & Gynecology

## 2016-09-23 ENCOUNTER — Telehealth: Payer: Self-pay

## 2016-09-23 NOTE — Telephone Encounter (Signed)
Patient informed of Echo results

## 2016-09-23 NOTE — Telephone Encounter (Signed)
-----   Message from Drema DallasAdam R Jaffe, DO sent at 09/23/2016  6:33 AM EDT ----- Echo looks okay

## 2016-10-01 ENCOUNTER — Encounter: Payer: Self-pay | Admitting: Psychology

## 2016-10-01 ENCOUNTER — Ambulatory Visit (INDEPENDENT_AMBULATORY_CARE_PROVIDER_SITE_OTHER): Payer: Medicare Other | Admitting: Psychology

## 2016-10-01 DIAGNOSIS — R4189 Other symptoms and signs involving cognitive functions and awareness: Secondary | ICD-10-CM

## 2016-10-01 DIAGNOSIS — I639 Cerebral infarction, unspecified: Secondary | ICD-10-CM

## 2016-10-01 NOTE — Progress Notes (Signed)
   Neuropsychology Note  [name] came in today for 1 hour of neuropsychological testing with technician, [name], BS, under the supervision of Dr. Elvis CoilMaryBeth Bailar. The patient did not appear overtly distressed by the testing session, per behavioral observation or via self-report to the technician. Rest breaks were offered. [name] will return within 2 weeks for a feedback session with Dr. Alinda DoomsBailar at which time her test performances, clinical impressions and treatment recommendations will be reviewed in detail. The patient understands she can contact our office should she require our assistance before this time.  Full report to follow.
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
## 2016-10-05 ENCOUNTER — Other Ambulatory Visit: Payer: Self-pay | Admitting: Physician Assistant

## 2016-10-05 DIAGNOSIS — M4727 Other spondylosis with radiculopathy, lumbosacral region: Secondary | ICD-10-CM

## 2016-10-08 ENCOUNTER — Other Ambulatory Visit: Payer: Self-pay | Admitting: Physician Assistant

## 2016-10-08 DIAGNOSIS — M4727 Other spondylosis with radiculopathy, lumbosacral region: Secondary | ICD-10-CM

## 2016-10-13 ENCOUNTER — Emergency Department (HOSPITAL_COMMUNITY): Payer: Medicare Other

## 2016-10-13 ENCOUNTER — Encounter (HOSPITAL_COMMUNITY): Payer: Self-pay | Admitting: Nurse Practitioner

## 2016-10-13 ENCOUNTER — Emergency Department (HOSPITAL_COMMUNITY)
Admission: EM | Admit: 2016-10-13 | Discharge: 2016-10-13 | Disposition: A | Discharge status: Home / Self Care | Payer: Medicare Other | Source: Home / Self Care | Attending: Emergency Medicine | Admitting: Emergency Medicine

## 2016-10-13 DIAGNOSIS — J45909 Unspecified asthma, uncomplicated: Secondary | ICD-10-CM | POA: Insufficient documentation

## 2016-10-13 DIAGNOSIS — I129 Hypertensive chronic kidney disease with stage 1 through stage 4 chronic kidney disease, or unspecified chronic kidney disease: Secondary | ICD-10-CM | POA: Insufficient documentation

## 2016-10-13 DIAGNOSIS — R51 Headache: Secondary | ICD-10-CM | POA: Insufficient documentation

## 2016-10-13 DIAGNOSIS — Z79899 Other long term (current) drug therapy: Secondary | ICD-10-CM | POA: Insufficient documentation

## 2016-10-13 DIAGNOSIS — F17210 Nicotine dependence, cigarettes, uncomplicated: Secondary | ICD-10-CM

## 2016-10-13 DIAGNOSIS — N189 Chronic kidney disease, unspecified: Secondary | ICD-10-CM | POA: Insufficient documentation

## 2016-10-13 DIAGNOSIS — R519 Headache, unspecified: Secondary | ICD-10-CM

## 2016-10-13 MED ORDER — KETOROLAC TROMETHAMINE 30 MG/ML IJ SOLN
15.0000 mg | Freq: Once | INTRAMUSCULAR | Status: AC
  Administered 2016-10-13: 15 mg via INTRAVENOUS
  Filled 2016-10-13: qty 1

## 2016-10-13 MED ORDER — METOCLOPRAMIDE HCL 5 MG/ML IJ SOLN
10.0000 mg | Freq: Once | INTRAMUSCULAR | Status: AC
  Administered 2016-10-13: 10 mg via INTRAVENOUS
  Filled 2016-10-13: qty 2

## 2016-10-13 MED ORDER — PROCHLORPERAZINE EDISYLATE 5 MG/ML IJ SOLN
10.0000 mg | Freq: Once | INTRAMUSCULAR | Status: AC | PRN
  Administered 2016-10-13: 10 mg via INTRAVENOUS
  Filled 2016-10-13: qty 2

## 2016-10-13 MED ORDER — DIPHENHYDRAMINE HCL 50 MG/ML IJ SOLN
25.0000 mg | Freq: Once | INTRAMUSCULAR | Status: AC
  Administered 2016-10-13: 25 mg via INTRAVENOUS
  Filled 2016-10-13: qty 1

## 2016-10-13 NOTE — Discharge Instructions (Signed)
Please continue to take advil (or ibuprofen) or tylenol as needed for headache. Please return without fail for worsening symptoms, including fever, worsening pain, difficulty walking, new speech or vision problems, new numbness/weakness or any other symptoms concerning to you.  Please follow-up with your neurologist.
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
## 2016-10-13 NOTE — ED Triage Notes (Signed)
Patient woke up this morning and bent over and started getting dizzy and had a sharp pain in her head. Patient says it is a migraine. 10/10 pain sensitive to light. Stabbing pain behind her eyes.

## 2016-10-13 NOTE — ED Notes (Signed)
Bed: WA24 Expected date:  Expected time:  Means of arrival:  Comments: EMS-headache 

## 2016-10-15 ENCOUNTER — Ambulatory Visit: Payer: Medicare Other | Admitting: Podiatry

## 2016-10-15 ENCOUNTER — Emergency Department (HOSPITAL_COMMUNITY): Payer: Medicare Other

## 2016-10-15 ENCOUNTER — Encounter (HOSPITAL_COMMUNITY): Payer: Self-pay

## 2016-10-15 ENCOUNTER — Inpatient Hospital Stay (HOSPITAL_COMMUNITY)
Admission: EM | Admit: 2016-10-15 | Discharge: 2016-10-20 | DRG: 041 | Disposition: A | Discharge status: Skilled Nursing Facility | Payer: Medicare Other | Attending: Internal Medicine | Admitting: Internal Medicine

## 2016-10-15 DIAGNOSIS — M549 Dorsalgia, unspecified: Secondary | ICD-10-CM | POA: Diagnosis present

## 2016-10-15 DIAGNOSIS — Z79899 Other long term (current) drug therapy: Secondary | ICD-10-CM

## 2016-10-15 DIAGNOSIS — R402362 Coma scale, best motor response, obeys commands, at arrival to emergency department: Secondary | ICD-10-CM | POA: Diagnosis present

## 2016-10-15 DIAGNOSIS — I129 Hypertensive chronic kidney disease with stage 1 through stage 4 chronic kidney disease, or unspecified chronic kidney disease: Secondary | ICD-10-CM | POA: Diagnosis present

## 2016-10-15 DIAGNOSIS — H5462 Unqualified visual loss, left eye, normal vision right eye: Secondary | ICD-10-CM | POA: Diagnosis present

## 2016-10-15 DIAGNOSIS — G8929 Other chronic pain: Secondary | ICD-10-CM | POA: Diagnosis present

## 2016-10-15 DIAGNOSIS — J45909 Unspecified asthma, uncomplicated: Secondary | ICD-10-CM | POA: Diagnosis present

## 2016-10-15 DIAGNOSIS — R29810 Facial weakness: Secondary | ICD-10-CM | POA: Diagnosis present

## 2016-10-15 DIAGNOSIS — I63531 Cerebral infarction due to unspecified occlusion or stenosis of right posterior cerebral artery: Secondary | ICD-10-CM

## 2016-10-15 DIAGNOSIS — I639 Cerebral infarction, unspecified: Secondary | ICD-10-CM | POA: Diagnosis present

## 2016-10-15 DIAGNOSIS — Z981 Arthrodesis status: Secondary | ICD-10-CM

## 2016-10-15 DIAGNOSIS — F411 Generalized anxiety disorder: Secondary | ICD-10-CM | POA: Diagnosis present

## 2016-10-15 DIAGNOSIS — Z7951 Long term (current) use of inhaled steroids: Secondary | ICD-10-CM

## 2016-10-15 DIAGNOSIS — Z91013 Allergy to seafood: Secondary | ICD-10-CM

## 2016-10-15 DIAGNOSIS — R29705 NIHSS score 5: Secondary | ICD-10-CM | POA: Diagnosis present

## 2016-10-15 DIAGNOSIS — I63431 Cerebral infarction due to embolism of right posterior cerebral artery: Principal | ICD-10-CM | POA: Diagnosis present

## 2016-10-15 DIAGNOSIS — N182 Chronic kidney disease, stage 2 (mild): Secondary | ICD-10-CM | POA: Diagnosis present

## 2016-10-15 DIAGNOSIS — Z7982 Long term (current) use of aspirin: Secondary | ICD-10-CM

## 2016-10-15 DIAGNOSIS — W19XXXA Unspecified fall, initial encounter: Secondary | ICD-10-CM | POA: Diagnosis present

## 2016-10-15 DIAGNOSIS — E785 Hyperlipidemia, unspecified: Secondary | ICD-10-CM | POA: Diagnosis present

## 2016-10-15 DIAGNOSIS — F17210 Nicotine dependence, cigarettes, uncomplicated: Secondary | ICD-10-CM | POA: Diagnosis present

## 2016-10-15 DIAGNOSIS — I10 Essential (primary) hypertension: Secondary | ICD-10-CM | POA: Diagnosis present

## 2016-10-15 DIAGNOSIS — G8194 Hemiplegia, unspecified affecting left nondominant side: Secondary | ICD-10-CM | POA: Diagnosis present

## 2016-10-15 DIAGNOSIS — Z7952 Long term (current) use of systemic steroids: Secondary | ICD-10-CM

## 2016-10-15 DIAGNOSIS — Z8249 Family history of ischemic heart disease and other diseases of the circulatory system: Secondary | ICD-10-CM

## 2016-10-15 DIAGNOSIS — R402142 Coma scale, eyes open, spontaneous, at arrival to emergency department: Secondary | ICD-10-CM | POA: Diagnosis present

## 2016-10-15 DIAGNOSIS — Z7983 Long term (current) use of bisphosphonates: Secondary | ICD-10-CM

## 2016-10-15 DIAGNOSIS — J449 Chronic obstructive pulmonary disease, unspecified: Secondary | ICD-10-CM | POA: Diagnosis present

## 2016-10-15 DIAGNOSIS — R402252 Coma scale, best verbal response, oriented, at arrival to emergency department: Secondary | ICD-10-CM | POA: Diagnosis present

## 2016-10-15 DIAGNOSIS — F17200 Nicotine dependence, unspecified, uncomplicated: Secondary | ICD-10-CM | POA: Diagnosis present

## 2016-10-15 DIAGNOSIS — M1990 Unspecified osteoarthritis, unspecified site: Secondary | ICD-10-CM | POA: Diagnosis present

## 2016-10-15 HISTORY — DX: Hyperlipidemia, unspecified: E78.5

## 2016-10-15 LAB — APTT: APTT: 35 s (ref 24–36)

## 2016-10-15 LAB — I-STAT TROPONIN, ED: Troponin i, poc: 0.04 ng/mL (ref 0.00–0.08)

## 2016-10-15 LAB — I-STAT CHEM 8, ED
BUN: 18 mg/dL (ref 6–20)
CHLORIDE: 110 mmol/L (ref 101–111)
CREATININE: 1.7 mg/dL — AB (ref 0.44–1.00)
Calcium, Ion: 1.39 mmol/L (ref 1.15–1.40)
GLUCOSE: 134 mg/dL — AB (ref 65–99)
HCT: 43 % (ref 36.0–46.0)
Hemoglobin: 14.6 g/dL (ref 12.0–15.0)
POTASSIUM: 3.8 mmol/L (ref 3.5–5.1)
Sodium: 143 mmol/L (ref 135–145)
TCO2: 23 mmol/L (ref 0–100)

## 2016-10-15 LAB — COMPREHENSIVE METABOLIC PANEL
ALBUMIN: 3.6 g/dL (ref 3.5–5.0)
ALT: 11 U/L — AB (ref 14–54)
AST: 21 U/L (ref 15–41)
Alkaline Phosphatase: 58 U/L (ref 38–126)
Anion gap: 10 (ref 5–15)
BUN: 15 mg/dL (ref 6–20)
CHLORIDE: 111 mmol/L (ref 101–111)
CO2: 20 mmol/L — AB (ref 22–32)
CREATININE: 1.66 mg/dL — AB (ref 0.44–1.00)
Calcium: 10.6 mg/dL — ABNORMAL HIGH (ref 8.9–10.3)
GFR calc Af Amer: 35 mL/min — ABNORMAL LOW (ref >60)
GFR calc non Af Amer: 30 mL/min — ABNORMAL LOW (ref >60)
Glucose, Bld: 133 mg/dL — ABNORMAL HIGH (ref 65–99)
Potassium: 3.8 mmol/L (ref 3.5–5.1)
SODIUM: 141 mmol/L (ref 135–145)
Total Bilirubin: 0.6 mg/dL (ref 0.3–1.2)
Total Protein: 7.4 g/dL (ref 6.5–8.1)

## 2016-10-15 LAB — DIFFERENTIAL
BASOS ABS: 0 10*3/uL (ref 0.0–0.1)
Basophils Relative: 0 %
Eosinophils Absolute: 0.3 10*3/uL (ref 0.0–0.7)
Eosinophils Relative: 3 %
Lymphocytes Relative: 22 %
Lymphs Abs: 2.3 10*3/uL (ref 0.7–4.0)
Monocytes Absolute: 0.8 10*3/uL (ref 0.1–1.0)
Monocytes Relative: 7 %
NEUTROS ABS: 7.3 10*3/uL (ref 1.7–7.7)
NEUTROS PCT: 68 %

## 2016-10-15 LAB — CBC
HEMATOCRIT: 42.7 % (ref 36.0–46.0)
Hemoglobin: 14 g/dL (ref 12.0–15.0)
MCH: 30.6 pg (ref 26.0–34.0)
MCHC: 32.8 g/dL (ref 30.0–36.0)
MCV: 93.2 fL (ref 78.0–100.0)
PLATELETS: 211 10*3/uL (ref 150–400)
RBC: 4.58 MIL/uL (ref 3.87–5.11)
RDW: 14.5 % (ref 11.5–15.5)
WBC: 10.7 10*3/uL — AB (ref 4.0–10.5)

## 2016-10-15 LAB — PROTIME-INR
INR: 1.1
PROTHROMBIN TIME: 14.2 s (ref 11.4–15.2)

## 2016-10-15 NOTE — ED Notes (Signed)
Pt c/o weakness in hands. Keeps dropping things, multiple falls. Knot present on left side of forehead.

## 2016-10-15 NOTE — ED Triage Notes (Signed)
Pt states that since Monday she has been having a headache with dizziness and syncopal episodes and disorientation, pt fell and hit head on Monday with small hematoma to L side of head. [facility] LSN Monday afternoon by family. Pt has slight L sided facial droop, all other neuro intact.

## 2016-10-15 NOTE — ED Notes (Signed)
Moved per verbal request Dr.Plunkett due to results of scan show stroke

## 2016-10-16 ENCOUNTER — Observation Stay (HOSPITAL_COMMUNITY): Payer: Medicare Other

## 2016-10-16 ENCOUNTER — Encounter (HOSPITAL_COMMUNITY): Payer: Self-pay | Admitting: Internal Medicine

## 2016-10-16 DIAGNOSIS — I639 Cerebral infarction, unspecified: Secondary | ICD-10-CM

## 2016-10-16 DIAGNOSIS — Z91013 Allergy to seafood: Secondary | ICD-10-CM | POA: Diagnosis not present

## 2016-10-16 DIAGNOSIS — J449 Chronic obstructive pulmonary disease, unspecified: Secondary | ICD-10-CM | POA: Diagnosis not present

## 2016-10-16 DIAGNOSIS — H5462 Unqualified visual loss, left eye, normal vision right eye: Secondary | ICD-10-CM | POA: Diagnosis present

## 2016-10-16 DIAGNOSIS — W19XXXA Unspecified fall, initial encounter: Secondary | ICD-10-CM | POA: Diagnosis present

## 2016-10-16 DIAGNOSIS — Z981 Arthrodesis status: Secondary | ICD-10-CM | POA: Diagnosis not present

## 2016-10-16 DIAGNOSIS — R29810 Facial weakness: Secondary | ICD-10-CM | POA: Diagnosis present

## 2016-10-16 DIAGNOSIS — Z79899 Other long term (current) drug therapy: Secondary | ICD-10-CM | POA: Diagnosis not present

## 2016-10-16 DIAGNOSIS — I63531 Cerebral infarction due to unspecified occlusion or stenosis of right posterior cerebral artery: Secondary | ICD-10-CM | POA: Diagnosis present

## 2016-10-16 DIAGNOSIS — F411 Generalized anxiety disorder: Secondary | ICD-10-CM | POA: Diagnosis present

## 2016-10-16 DIAGNOSIS — R29705 NIHSS score 5: Secondary | ICD-10-CM | POA: Diagnosis present

## 2016-10-16 DIAGNOSIS — R402142 Coma scale, eyes open, spontaneous, at arrival to emergency department: Secondary | ICD-10-CM | POA: Diagnosis present

## 2016-10-16 DIAGNOSIS — G8194 Hemiplegia, unspecified affecting left nondominant side: Secondary | ICD-10-CM | POA: Diagnosis present

## 2016-10-16 DIAGNOSIS — Z7952 Long term (current) use of systemic steroids: Secondary | ICD-10-CM | POA: Diagnosis not present

## 2016-10-16 DIAGNOSIS — Z7951 Long term (current) use of inhaled steroids: Secondary | ICD-10-CM | POA: Diagnosis not present

## 2016-10-16 DIAGNOSIS — G8929 Other chronic pain: Secondary | ICD-10-CM | POA: Diagnosis present

## 2016-10-16 DIAGNOSIS — N182 Chronic kidney disease, stage 2 (mild): Secondary | ICD-10-CM | POA: Diagnosis present

## 2016-10-16 DIAGNOSIS — F17200 Nicotine dependence, unspecified, uncomplicated: Secondary | ICD-10-CM | POA: Diagnosis not present

## 2016-10-16 DIAGNOSIS — E785 Hyperlipidemia, unspecified: Secondary | ICD-10-CM | POA: Diagnosis not present

## 2016-10-16 DIAGNOSIS — R402362 Coma scale, best motor response, obeys commands, at arrival to emergency department: Secondary | ICD-10-CM | POA: Diagnosis present

## 2016-10-16 DIAGNOSIS — M549 Dorsalgia, unspecified: Secondary | ICD-10-CM | POA: Diagnosis present

## 2016-10-16 DIAGNOSIS — R402252 Coma scale, best verbal response, oriented, at arrival to emergency department: Secondary | ICD-10-CM | POA: Diagnosis present

## 2016-10-16 DIAGNOSIS — Z7983 Long term (current) use of bisphosphonates: Secondary | ICD-10-CM | POA: Diagnosis not present

## 2016-10-16 DIAGNOSIS — M1990 Unspecified osteoarthritis, unspecified site: Secondary | ICD-10-CM | POA: Diagnosis present

## 2016-10-16 DIAGNOSIS — F17210 Nicotine dependence, cigarettes, uncomplicated: Secondary | ICD-10-CM | POA: Diagnosis present

## 2016-10-16 DIAGNOSIS — I10 Essential (primary) hypertension: Secondary | ICD-10-CM | POA: Diagnosis not present

## 2016-10-16 DIAGNOSIS — I63431 Cerebral infarction due to embolism of right posterior cerebral artery: Secondary | ICD-10-CM | POA: Diagnosis present

## 2016-10-16 DIAGNOSIS — Z7982 Long term (current) use of aspirin: Secondary | ICD-10-CM | POA: Diagnosis not present

## 2016-10-16 DIAGNOSIS — Z8249 Family history of ischemic heart disease and other diseases of the circulatory system: Secondary | ICD-10-CM | POA: Diagnosis not present

## 2016-10-16 LAB — VAS US CAROTID
LCCADSYS: -55 cm/s
LEFT ECA DIAS: -12 cm/s
LEFT VERTEBRAL DIAS: -16 cm/s
LICAPDIAS: 12 cm/s
Left CCA dist dias: -15 cm/s
Left CCA prox dias: 15 cm/s
Left CCA prox sys: 80 cm/s
Left ICA dist dias: -20 cm/s
Left ICA dist sys: -59 cm/s
Left ICA prox sys: 44 cm/s
RCCAPDIAS: 14 cm/s
RCCAPSYS: 76 cm/s
RIGHT ECA DIAS: -11 cm/s
RIGHT VERTEBRAL DIAS: -13 cm/s
Right cca dist sys: -60 cm/s

## 2016-10-16 LAB — COMPREHENSIVE METABOLIC PANEL
ALT: 11 U/L — AB (ref 14–54)
AST: 20 U/L (ref 15–41)
Albumin: 3.3 g/dL — ABNORMAL LOW (ref 3.5–5.0)
Alkaline Phosphatase: 52 U/L (ref 38–126)
Anion gap: 10 (ref 5–15)
BUN: 17 mg/dL (ref 6–20)
CHLORIDE: 112 mmol/L — AB (ref 101–111)
CO2: 20 mmol/L — AB (ref 22–32)
CREATININE: 1.58 mg/dL — AB (ref 0.44–1.00)
Calcium: 10.2 mg/dL (ref 8.9–10.3)
GFR calc non Af Amer: 32 mL/min — ABNORMAL LOW (ref >60)
GFR, EST AFRICAN AMERICAN: 37 mL/min — AB (ref >60)
Glucose, Bld: 88 mg/dL (ref 65–99)
POTASSIUM: 4.1 mmol/L (ref 3.5–5.1)
SODIUM: 142 mmol/L (ref 135–145)
Total Bilirubin: 0.7 mg/dL (ref 0.3–1.2)
Total Protein: 6.7 g/dL (ref 6.5–8.1)

## 2016-10-16 LAB — MRSA PCR SCREENING: MRSA by PCR: POSITIVE — AB

## 2016-10-16 LAB — LIPID PANEL
Cholesterol: 130 mg/dL (ref 0–200)
HDL: 44 mg/dL (ref >40)
LDL Cholesterol: 73 mg/dL (ref 0–99)
TRIGLYCERIDES: 67 mg/dL (ref <150)
Total CHOL/HDL Ratio: 3 RATIO
VLDL: 13 mg/dL (ref 0–40)

## 2016-10-16 MED ORDER — ACETAMINOPHEN 325 MG PO TABS
650.0000 mg | ORAL_TABLET | ORAL | Status: DC | PRN
  Administered 2016-10-16 – 2016-10-20 (×8): 650 mg via ORAL
  Filled 2016-10-16 (×9): qty 2

## 2016-10-16 MED ORDER — LOSARTAN POTASSIUM 50 MG PO TABS
100.0000 mg | ORAL_TABLET | Freq: Every day | ORAL | Status: DC
  Administered 2016-10-16 – 2016-10-20 (×5): 100 mg via ORAL
  Filled 2016-10-16 (×5): qty 2

## 2016-10-16 MED ORDER — ENOXAPARIN SODIUM 40 MG/0.4ML ~~LOC~~ SOLN: 40.0000 mg | SUBCUTANEOUS | Status: DC

## 2016-10-16 MED ORDER — CHLORHEXIDINE GLUCONATE CLOTH 2 % EX PADS
6.0000 | MEDICATED_PAD | Freq: Every day | CUTANEOUS | Status: DC
  Administered 2016-10-19 – 2016-10-20 (×2): 6 via TOPICAL

## 2016-10-16 MED ORDER — ENOXAPARIN SODIUM 40 MG/0.4ML ~~LOC~~ SOLN
40.0000 mg | SUBCUTANEOUS | Status: DC
  Administered 2016-10-17 – 2016-10-20 (×3): 40 mg via SUBCUTANEOUS
  Filled 2016-10-16 (×3): qty 0.4

## 2016-10-16 MED ORDER — ROSUVASTATIN CALCIUM 5 MG PO TABS
10.0000 mg | ORAL_TABLET | Freq: Every day | ORAL | Status: DC
  Administered 2016-10-16 – 2016-10-20 (×5): 10 mg via ORAL
  Filled 2016-10-16: qty 2
  Filled 2016-10-16: qty 1
  Filled 2016-10-16: qty 2
  Filled 2016-10-16: qty 1
  Filled 2016-10-16: qty 2

## 2016-10-16 MED ORDER — VITAMIN B-1 100 MG PO TABS
100.0000 mg | ORAL_TABLET | Freq: Every day | ORAL | Status: DC
  Administered 2016-10-16 – 2016-10-20 (×5): 100 mg via ORAL
  Filled 2016-10-16 (×5): qty 1

## 2016-10-16 MED ORDER — ASPIRIN 300 MG RE SUPP: 300.0000 mg | Freq: Every day | RECTAL | Status: DC

## 2016-10-16 MED ORDER — ESCITALOPRAM OXALATE 10 MG PO TABS
10.0000 mg | ORAL_TABLET | Freq: Every day | ORAL | Status: DC
  Administered 2016-10-16 – 2016-10-20 (×5): 10 mg via ORAL
  Filled 2016-10-16 (×5): qty 1

## 2016-10-16 MED ORDER — STROKE: EARLY STAGES OF RECOVERY BOOK
Freq: Once | Status: DC
  Filled 2016-10-16: qty 1

## 2016-10-16 MED ORDER — LORAZEPAM 2 MG/ML IJ SOLN
0.5000 mg | Freq: Once | INTRAMUSCULAR | Status: AC
  Administered 2016-10-16: 0.5 mg via INTRAVENOUS
  Filled 2016-10-16: qty 1

## 2016-10-16 MED ORDER — SODIUM CHLORIDE 0.9 % IV SOLN
INTRAVENOUS | Status: AC
  Administered 2016-10-16 (×2): via INTRAVENOUS

## 2016-10-16 MED ORDER — VITAMIN D (ERGOCALCIFEROL) 1.25 MG (50000 UNIT) PO CAPS
50000.0000 [IU] | ORAL_CAPSULE | ORAL | Status: DC
  Filled 2016-10-16: qty 1

## 2016-10-16 MED ORDER — ACETAMINOPHEN 650 MG RE SUPP: 650.0000 mg | RECTAL | Status: DC | PRN

## 2016-10-16 MED ORDER — MONTELUKAST SODIUM 10 MG PO TABS
10.0000 mg | ORAL_TABLET | Freq: Every day | ORAL | Status: DC
  Administered 2016-10-16 – 2016-10-17 (×2): 10 mg via ORAL
  Filled 2016-10-16 (×2): qty 1

## 2016-10-16 MED ORDER — ALBUTEROL SULFATE (2.5 MG/3ML) 0.083% IN NEBU
3.0000 mL | INHALATION_SOLUTION | RESPIRATORY_TRACT | Status: DC | PRN
  Administered 2016-10-17 – 2016-10-18 (×4): 3 mL via RESPIRATORY_TRACT
  Filled 2016-10-16 (×5): qty 3

## 2016-10-16 MED ORDER — ACETAMINOPHEN 160 MG/5ML PO SOLN: 650.0000 mg | ORAL | Status: DC | PRN

## 2016-10-16 MED ORDER — ENOXAPARIN SODIUM 30 MG/0.3ML ~~LOC~~ SOLN
30.0000 mg | SUBCUTANEOUS | Status: DC
  Administered 2016-10-16: 30 mg via SUBCUTANEOUS
  Filled 2016-10-16: qty 0.3

## 2016-10-16 MED ORDER — CLOPIDOGREL BISULFATE 75 MG PO TABS
75.0000 mg | ORAL_TABLET | Freq: Every day | ORAL | Status: DC
  Administered 2016-10-16: 75 mg via ORAL
  Filled 2016-10-16 (×2): qty 1

## 2016-10-16 MED ORDER — ASPIRIN 325 MG PO TABS
325.0000 mg | ORAL_TABLET | Freq: Every day | ORAL | Status: DC
  Administered 2016-10-16 – 2016-10-20 (×5): 325 mg via ORAL
  Filled 2016-10-16 (×5): qty 1

## 2016-10-16 MED ORDER — NICOTINE 21 MG/24HR TD PT24
21.0000 mg | MEDICATED_PATCH | Freq: Every day | TRANSDERMAL | Status: DC
Start: 2016-10-16 — End: 2016-10-20
  Administered 2016-10-16 – 2016-10-20 (×5): 21 mg via TRANSDERMAL
  Filled 2016-10-16 (×5): qty 1

## 2016-10-16 MED ORDER — MUPIROCIN 2 % EX OINT
1.0000 "application " | TOPICAL_OINTMENT | Freq: Two times a day (BID) | CUTANEOUS | Status: DC
  Administered 2016-10-16 – 2016-10-20 (×8): 1 via NASAL
  Filled 2016-10-16 (×2): qty 22

## 2016-10-16 MED ORDER — MOMETASONE FURO-FORMOTEROL FUM 200-5 MCG/ACT IN AERO
2.0000 | INHALATION_SPRAY | Freq: Two times a day (BID) | RESPIRATORY_TRACT | Status: DC
  Administered 2016-10-16 – 2016-10-20 (×5): 2 via RESPIRATORY_TRACT
  Filled 2016-10-16 (×2): qty 8.8

## 2016-10-16 MED ORDER — DILTIAZEM HCL ER COATED BEADS 240 MG PO CP24
240.0000 mg | ORAL_CAPSULE | Freq: Every day | ORAL | Status: DC
  Administered 2016-10-16 – 2016-10-20 (×5): 240 mg via ORAL
  Filled 2016-10-16 (×5): qty 1

## 2016-10-16 MED ORDER — TIOTROPIUM BROMIDE MONOHYDRATE 18 MCG IN CAPS
18.0000 ug | ORAL_CAPSULE | Freq: Every day | RESPIRATORY_TRACT | Status: DC
  Administered 2016-10-16 – 2016-10-20 (×3): 18 ug via RESPIRATORY_TRACT
  Filled 2016-10-16: qty 5
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
## 2016-10-16 NOTE — ED Notes (Signed)
Pollina, MD at bedside 

## 2016-10-16 NOTE — Consult Note (Addendum)
Neurology Consultation Reason for Consult: Stroke Referring Physician: Toniann FailKakrakandy, A  CC: Headache  History is obtained from: Patient  HPI: [name] is a 71 y.o. female with a history of headaches who presents with headache in several days duration, as well as visual change for least a few days. She states that she hit her head and it has been hurting ever since. It is mostly frontal, where she had it. She went to the emergency department several days ago where a CT scan was performed and read as negative. After this, her daughter noticed that she was not seen well on the left side and due to the state presented back today.  He was CT scan was obtained which shows a right occipital stroke.   LKW: Sunday tpa given?: no, out of window    ROS: A 14 point ROS was performed and is negative except as noted in the HPI.   Past Medical History:  Diagnosis Date  . ALLERGIC RHINITIS   . Arthritis   . ASTHMA, UNSPECIFIED, UNSPECIFIED STATUS   . DYSLIPIDEMIA   . Headache(784.0)   . HYPERTENSION   . Hypertension   . Mild renal insufficiency   . PNA (pneumonia)   . Shortness of breath dyspnea   . SPINAL STENOSIS, LUMBAR    MRI 12/2005; surg 2004  . TOBACCO ABUSE      Family History  Problem Relation Age of Onset  . Mental illness Mother   . Hypertension Mother   . Arthritis Other   . Hypertension Other      Social History:  reports that she has been smoking Cigarettes.  She has a 10.00 pack-year smoking history. She has never used smokeless tobacco. She reports that she does not drink alcohol or use drugs.   Exam: Current vital signs: BP 115/86   Pulse 72   Temp 98.7 F (37.1 C)   Resp 15   SpO2 98%  Vital signs in last 24 hours: Temp:  [98.6 F (37 C)-98.7 F (37.1 C)] 98.7 F (37.1 C) (06/01 0110) Pulse Rate:  [72-92] 72 (06/01 0215) Resp:  [13-21] 15 (06/01 0215) BP: (109-136)/(73-99) 115/86 (06/01 0215) SpO2:  [96 %-100 %] 98 % (06/01 0215)   Physical  Exam  Constitutional: Appears well-developed and well-nourished.  Psych: Affect appropriate to situation Eyes: No scleral injection HENT: No OP obstrucion Head: Normocephalic.  Cardiovascular: Normal rate and regular rhythm.  Respiratory: Effort normal and breath sounds normal to anterior ascultation GI: Soft.  No distension. There is no tenderness.  Skin: WDI  Neuro: Mental Status: Patient is awake, alert, oriented to person, place, month, year, and situation. Patient is able to give a clear and coherent history. No signs of aphasia  She has mild extinction to double simultaneous stimulation. Cranial Nerves: II: Visual Fields are full. Pupils are equal, round, and reactive to light.   III,IV, VI: EOMI without ptosis or diploplia.  V: Facial sensation is symmetric to temperature VII: Facial movement is symmetric.  VIII: hearing is intact to voice X: Uvula elevates symmetrically XI: Shoulder shrug is symmetric. XII: tongue is midline without atrophy or fasciculations.  Motor: Tone is normal. Bulk is normal. 5/5 strength was present in all four extremities.  Sensory: Sensation is symmetric to light touch and temperature in the arms and legs. Cerebellar: FNF with mild tremor bilaterally.  I have reviewed labs in epic and the results pertinent to this consultation are: CMP-borderline creatinine  I have reviewed the images obtained: CT  head-right occipital infarct  Impression: 71 year old female with right posterior infarct. She is going to be evaluated for embolic versus thrombotic workup. Her creatinine is elevated so CTA head and neck will be held off on for now.  Recommendations: 1. HgbA1c, fasting lipid panel 2. MRI, MRA  of the brain without contrast 3. Frequent neuro checks 4. Echocardiogram 5. Carotid dopplers 6. Prophylactic therapy-Antiplatelet med: Aspirin - dose 325mg  PO or 300mg  PR 7. Risk factor modification 8. Telemetry monitoring 9. PT consult, OT consult,  Speech consult 10. please page stroke NP  Or  PA  Or MD  from 8am -4 pm as this patient will be followed by the stroke team at this point.   You can look them up on [email or web address]      Ritta Slot, MD [facility] [phone]  If 7pm- 7am, please page neurology on call as listed in AMION.

## 2016-10-16 NOTE — Consult Note (Signed)
Reason for Consult:possible cardioembolic CVA Referring Physician: [facility] hospital[facility]    [name] is an 71 y.o. female.  HPI: patient is 71 year old female with past medical history significant for hypertension, hyperlipidemia, COPD,history of bronchial asthma, tobacco abuse continues to smoke, chronic kidney disease stage II, history of spinal stenosis, status post anterolateral lumbar fusion in the past,was admitted earlier today because of recurrent headache, unsteady gait and visual changes and was noted to have acute stroke in the region of right posterior cerebral artery and also small focus of ischemia in the left cerebellum.patient was seen in the ED a few days earlier because of headache.  Had CT of the brain which was negative and was discharged home, but due to recurrent headache and new visual changes and unsteady gait decided to come to the ED.  Cardiology consultation is called to rule out cardiogenic source of CVA.patient denies any chest pain or shortness of breath.  Denies any palpitations or irregular heartbeats.  Denies syncopal episode.  Past Medical History:  Diagnosis Date  . ALLERGIC RHINITIS   . Arthritis   . ASTHMA, UNSPECIFIED, UNSPECIFIED STATUS   . DYSLIPIDEMIA   . Headache(784.0)   . HYPERTENSION   . Hypertension   . Mild renal insufficiency   . PNA (pneumonia)   . Shortness of breath dyspnea   . SPINAL STENOSIS, LUMBAR    MRI 12/2005; surg 2004  . TOBACCO ABUSE     Past Surgical History:  Procedure Laterality Date  . ANTERIOR LAT LUMBAR FUSION Right 12/30/2015   Procedure: Lumbar two-three, Lumbar three-four Anterior Lateral Lumbar Interbody Fusion with Anterior column realignment at Lumbar three-four;  Surgeon: Kevan Ny Ditty, MD;  Location: [place] [facility];  Service: Neurosurgery;  Laterality: Right;  . Closure of bronchopleural fistula, right lower lobe.    [name] COLONOSCOPY    . Decompressive Laminectomy     Fusion L4-5, L5-S1 ([facility]-2004)  .  Decortication of right lower lobe    . Placement of wound On-Q analgesia irrigation system.    [name] POSTERIOR LUMBAR FUSION 4 LEVEL N/A 12/30/2015   Procedure: Lumbar two -Sacral one Dorsal Internal Fixation and Fusion, Charline Bills osteotomies Lumbar two-three, Lumbar three-four;  Surgeon: Kevan Ny Ditty, MD;  Location: [place] [facility];  Service: Neurosurgery;  Laterality: N/A;  . Right hand  1995  . Right knee  1990  . Right video-assisted thoracoscopic surgery, drainage of empyema.  02/12/2011    Family History  Problem Relation Age of Onset  . Mental illness Mother   . Hypertension Mother   . Arthritis Other   . Hypertension Other     Social History:  reports that she has been smoking Cigarettes.  She has a 10.00 pack-year smoking history. She has never used smokeless tobacco. She reports that she does not drink alcohol or use drugs.  Allergies:  Allergies  Allergen Reactions  . Shrimp [Shellfish Allergy] Hives and Swelling    Medications: I have reviewed the patient's current medications.  Results for orders placed or performed during the hospital encounter of 10/15/16 (from the past 48 hour(s))  Protime-INR     Status: None   Collection Time: 10/15/16  9:54 PM  Result Value Ref Range   Prothrombin Time 14.2 11.4 - 15.2 seconds   INR 1.10   APTT     Status: None   Collection Time: 10/15/16  9:54 PM  Result Value Ref Range   aPTT 35 24 - 36 seconds  CBC  Status: Abnormal   Collection Time: 10/15/16  9:54 PM  Result Value Ref Range   WBC 10.7 (H) 4.0 - 10.5 K/uL   RBC 4.58 3.87 - 5.11 MIL/uL   Hemoglobin 14.0 12.0 - 15.0 g/dL   HCT 42.7 36.0 - 46.0 %   MCV 93.2 78.0 - 100.0 fL   MCH 30.6 26.0 - 34.0 pg   MCHC 32.8 30.0 - 36.0 g/dL   RDW 14.5 11.5 - 15.5 %   Platelets 211 150 - 400 K/uL  Differential     Status: None   Collection Time: 10/15/16  9:54 PM  Result Value Ref Range   Neutrophils Relative % 68 %   Neutro Abs 7.3 1.7 - 7.7 K/uL   Lymphocytes  Relative 22 %   Lymphs Abs 2.3 0.7 - 4.0 K/uL   Monocytes Relative 7 %   Monocytes Absolute 0.8 0.1 - 1.0 K/uL   Eosinophils Relative 3 %   Eosinophils Absolute 0.3 0.0 - 0.7 K/uL   Basophils Relative 0 %   Basophils Absolute 0.0 0.0 - 0.1 K/uL  Comprehensive metabolic panel     Status: Abnormal   Collection Time: 10/15/16  9:54 PM  Result Value Ref Range   Sodium 141 135 - 145 mmol/L   Potassium 3.8 3.5 - 5.1 mmol/L   Chloride 111 101 - 111 mmol/L   CO2 20 (L) 22 - 32 mmol/L   Glucose, Bld 133 (H) 65 - 99 mg/dL   BUN 15 6 - 20 mg/dL   Creatinine, Ser 1.66 (H) 0.44 - 1.00 mg/dL   Calcium 10.6 (H) 8.9 - 10.3 mg/dL   Total Protein 7.4 6.5 - 8.1 g/dL   Albumin 3.6 3.5 - 5.0 g/dL   AST 21 15 - 41 U/L   ALT 11 (L) 14 - 54 U/L   Alkaline Phosphatase 58 38 - 126 U/L   Total Bilirubin 0.6 0.3 - 1.2 mg/dL   GFR calc non Af Amer 30 (L) >60 mL/min   GFR calc Af Amer 35 (L) >60 mL/min    Comment: (NOTE) The eGFR has been calculated using the CKD EPI equation. This calculation has not been validated in all clinical situations. eGFR's persistently <60 mL/min signify possible Chronic Kidney Disease.    Anion gap 10 5 - 15  I-stat troponin, ED     Status: None   Collection Time: 10/15/16 10:12 PM  Result Value Ref Range   Troponin i, poc 0.04 0.00 - 0.08 ng/mL   Comment 3            Comment: Due to the release kinetics of cTnI, a negative result within the first hours of the onset of symptoms does not rule out myocardial infarction with certainty. If myocardial infarction is still suspected, repeat the test at appropriate intervals.   I-Stat Chem 8, ED     Status: Abnormal   Collection Time: 10/15/16 10:14 PM  Result Value Ref Range   Sodium 143 135 - 145 mmol/L   Potassium 3.8 3.5 - 5.1 mmol/L   Chloride 110 101 - 111 mmol/L   BUN 18 6 - 20 mg/dL   Creatinine, Ser 1.70 (H) 0.44 - 1.00 mg/dL   Glucose, Bld 134 (H) 65 - 99 mg/dL   Calcium, Ion 1.39 1.15 - 1.40 mmol/L   TCO2 23  0 - 100 mmol/L   Hemoglobin 14.6 12.0 - 15.0 g/dL   HCT 43.0 36.0 - 46.0 %  Lipid panel     Status:  None   Collection Time: 10/16/16  5:43 AM  Result Value Ref Range   Cholesterol 130 0 - 200 mg/dL   Triglycerides 67 <150 mg/dL   HDL 44 >40 mg/dL   Total CHOL/HDL Ratio 3.0 RATIO   VLDL 13 0 - 40 mg/dL   LDL Cholesterol 73 0 - 99 mg/dL    Comment:        Total Cholesterol/HDL:CHD Risk Coronary Heart Disease Risk Table                     Men   Women  1/2 Average Risk   3.4   3.3  Average Risk       5.0   4.4  2 X Average Risk   9.6   7.1  3 X Average Risk  23.4   11.0        Use the calculated Patient Ratio above and the CHD Risk Table to determine the patient's CHD Risk.        ATP III CLASSIFICATION (LDL):  <100     mg/dL   Optimal  100-129  mg/dL   Near or Above                    Optimal  130-159  mg/dL   Borderline  160-189  mg/dL   High  >190     mg/dL   Very High   Comprehensive metabolic panel     Status: Abnormal   Collection Time: 10/16/16  5:43 AM  Result Value Ref Range   Sodium 142 135 - 145 mmol/L   Potassium 4.1 3.5 - 5.1 mmol/L   Chloride 112 (H) 101 - 111 mmol/L   CO2 20 (L) 22 - 32 mmol/L   Glucose, Bld 88 65 - 99 mg/dL   BUN 17 6 - 20 mg/dL   Creatinine, Ser 1.58 (H) 0.44 - 1.00 mg/dL   Calcium 10.2 8.9 - 10.3 mg/dL   Total Protein 6.7 6.5 - 8.1 g/dL   Albumin 3.3 (L) 3.5 - 5.0 g/dL   AST 20 15 - 41 U/L   ALT 11 (L) 14 - 54 U/L   Alkaline Phosphatase 52 38 - 126 U/L   Total Bilirubin 0.7 0.3 - 1.2 mg/dL   GFR calc non Af Amer 32 (L) >60 mL/min   GFR calc Af Amer 37 (L) >60 mL/min    Comment: (NOTE) The eGFR has been calculated using the CKD EPI equation. This calculation has not been validated in all clinical situations. eGFR's persistently <60 mL/min signify possible Chronic Kidney Disease.    Anion gap 10 5 - 15    Ct Head Wo Contrast  Result Date: 10/15/2016 CLINICAL DATA:  Acute onset of headache and dizziness, after fall. Initial  encounter. EXAM: CT HEAD WITHOUT CONTRAST TECHNIQUE: Contiguous axial images were obtained from the base of the skull through the vertex without intravenous contrast. COMPARISON:  CT of the head performed 10/13/2016 FINDINGS: Brain: There is an evolving acute infarct at the right occipital lobe, with associated mass-effect. There is no evidence of hemorrhagic transformation. No definite mass is identified. Scattered periventricular and subcortical white matter change likely reflects small vessel ischemic microangiopathy. The brainstem and fourth ventricle are within normal limits. The basal ganglia are unremarkable in appearance. No midline shift is seen. Vascular: No hyperdense vessel or unexpected calcification. Skull: There is no evidence of fracture; visualized osseous structures are unremarkable in appearance. Sinuses/Orbits: The orbits are within normal limits.  The paranasal sinuses and mastoid air cells are well-aerated. Other: No significant soft tissue abnormalities are seen. IMPRESSION: 1. Evolving acute infarct at the right occipital lobe, with associated mass effect. No evidence of hemorrhagic transformation. 2. Scattered small vessel ischemic microangiopathy. These results were called by telephone at the time of interpretation on 10/15/2016 at 10:59 pm to Dr. Blanchie Dessert, who verbally acknowledged these results. Electronically Signed   By: Garald Balding M.D.   On: 10/15/2016 23:01   Mr Jodene Nam Head Wo Contrast  Result Date: 10/16/2016 CLINICAL DATA:  Headaches and visual changes. EXAM: MRI HEAD WITHOUT CONTRAST MRA HEAD WITHOUT CONTRAST TECHNIQUE: Multiplanar, multiecho pulse sequences of the brain and surrounding structures were obtained without intravenous contrast. Angiographic images of the head were obtained using MRA technique without contrast. COMPARISON:  1. Head CT 10/15/2016 2. Brain MRI 08/06/2016 FINDINGS: MRI HEAD FINDINGS Brain: The midline structures are normal. There is diffusion  restriction throughout the right posterior cerebral artery distribution. There is a small focus of diffusion restriction in the left cerebellum measuring approximately 10 mm. The diffusion restriction extends into the lateral aspect of the right thalamus. There is associated hyperintense T2 weighted signal at the above-described locations. There is confluent hyperintense T2-weighted signal within the periventricular white matter, most often seen in the setting of chronic microvascular ischemia. No intraparenchymal hematoma or chronic microhemorrhage. Brain volume is normal for age without age-advanced or lobar predominant atrophy. The dura is normal and there is no extra-axial collection. Skull and upper cervical spine: The visualized skull base, calvarium, upper cervical spine and extracranial soft tissues are normal. Sinuses/Orbits: No fluid levels or advanced mucosal thickening. No mastoid effusion. Normal orbits. MRA HEAD FINDINGS The time-of-flight images are degraded by motion. Intracranial internal carotid arteries: Normal. Anterior cerebral arteries: Normal. Middle cerebral arteries: Multifocal atherosclerotic narrowing on the right. No advanced left MCA stenosis. Posterior communicating arteries: Absent bilaterally. Posterior cerebral arteries: There is loss of the normal flow related enhancement of the right posterior cerebral artery near the P2 P3 junction. Basilar artery: Normal. Vertebral arteries: Left dominant. Normal. Superior cerebellar arteries: Normal. Anterior inferior cerebellar arteries: Not clearly visualized. Posterior inferior cerebellar arteries: Normal. IMPRESSION: 1. Acute ischemia throughout the right posterior cerebral artery distribution without hemorrhage or significant mass effect. 2. Small focus of acute ischemia in the left cerebellum. Bilateral distribution of lesions may indicate a central embolic process. 3. Severe stenosis or occlusion of the right posterior cerebral artery  near the junction of the P2 and P3 segments. 4. Otherwise motion-degraded MRA without large vessel occlusion. Electronically Signed   By: Ulyses Jarred M.D.   On: 10/16/2016 04:03   Mr Brain Wo Contrast  Result Date: 10/16/2016 CLINICAL DATA:  Headaches and visual changes. EXAM: MRI HEAD WITHOUT CONTRAST MRA HEAD WITHOUT CONTRAST TECHNIQUE: Multiplanar, multiecho pulse sequences of the brain and surrounding structures were obtained without intravenous contrast. Angiographic images of the head were obtained using MRA technique without contrast. COMPARISON:  1. Head CT 10/15/2016 2. Brain MRI 08/06/2016 FINDINGS: MRI HEAD FINDINGS Brain: The midline structures are normal. There is diffusion restriction throughout the right posterior cerebral artery distribution. There is a small focus of diffusion restriction in the left cerebellum measuring approximately 10 mm. The diffusion restriction extends into the lateral aspect of the right thalamus. There is associated hyperintense T2 weighted signal at the above-described locations. There is confluent hyperintense T2-weighted signal within the periventricular white matter, most often seen in the setting of chronic microvascular ischemia. No intraparenchymal  hematoma or chronic microhemorrhage. Brain volume is normal for age without age-advanced or lobar predominant atrophy. The dura is normal and there is no extra-axial collection. Skull and upper cervical spine: The visualized skull base, calvarium, upper cervical spine and extracranial soft tissues are normal. Sinuses/Orbits: No fluid levels or advanced mucosal thickening. No mastoid effusion. Normal orbits. MRA HEAD FINDINGS The time-of-flight images are degraded by motion. Intracranial internal carotid arteries: Normal. Anterior cerebral arteries: Normal. Middle cerebral arteries: Multifocal atherosclerotic narrowing on the right. No advanced left MCA stenosis. Posterior communicating arteries: Absent bilaterally.  Posterior cerebral arteries: There is loss of the normal flow related enhancement of the right posterior cerebral artery near the P2 P3 junction. Basilar artery: Normal. Vertebral arteries: Left dominant. Normal. Superior cerebellar arteries: Normal. Anterior inferior cerebellar arteries: Not clearly visualized. Posterior inferior cerebellar arteries: Normal. IMPRESSION: 1. Acute ischemia throughout the right posterior cerebral artery distribution without hemorrhage or significant mass effect. 2. Small focus of acute ischemia in the left cerebellum. Bilateral distribution of lesions may indicate a central embolic process. 3. Severe stenosis or occlusion of the right posterior cerebral artery near the junction of the P2 and P3 segments. 4. Otherwise motion-degraded MRA without large vessel occlusion. Electronically Signed   By: Ulyses Jarred M.D.   On: 10/16/2016 04:03    Review of Systems  Constitutional: Negative for chills and fever.  Eyes: Positive for blurred vision.  Respiratory: Negative for cough and shortness of breath.   Cardiovascular: Negative for chest pain, palpitations and orthopnea.  Gastrointestinal: Negative for nausea and vomiting.  Genitourinary: Negative for dysuria.  Neurological: Positive for dizziness and headaches.   Blood pressure 134/84, pulse 75, temperature 98.4 F (36.9 C), temperature source Oral, resp. rate 18, height _0  (1.676 m), weight 65.6 kg (144 lb 10 oz), SpO2 97 %. Physical Exam  Constitutional: She is oriented to person, place, and time.  HENT:  Head: Normocephalic and atraumatic.  Eyes: Conjunctivae are normal. Pupils are equal, round, and reactive to light. Left eye exhibits no discharge. No scleral icterus.  Neck: Normal range of motion. Neck supple. No JVD present. No tracheal deviation present. No thyromegaly present.  Cardiovascular: Normal rate and regular rhythm.   Murmur (soft systolic murmur noted.  No S3 gallop) heard. Respiratory: Effort  normal and breath sounds normal. No respiratory distress. She has no wheezes. She has no rales.  GI: Soft. Bowel sounds are normal. She exhibits no distension. There is no tenderness. There is no rebound.  Musculoskeletal: She exhibits no edema, tenderness or deformity.  Neurological: She is alert and oriented to person, place, and time.  Left visual field deficit noted.    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 Small focus of acute ischemia in the left cerebellum, rule out cardioembolic CVA. Hypertension. Hyperlipidemia. COPD History of bronchial asthma. Tobacco abuse. Chronic kidney disease stage II. Degenerative joint disease. History of spinal stenosis. Plan Continue present management We'll schedule for for TEE. Keep patient nothing by mouth for now  Charolette Forward 10/16/2016, 8:48 AM

## 2016-10-16 NOTE — Progress Notes (Signed)
STROKE TEAM PROGRESS NOTE   SUBJECTIVE (INTERVAL HISTORY) Patient states she still has peripheral vision loss.He denies any prior history of atrial fibrillation significant cardiac arrhythmias.   OBJECTIVE Temp:  [98.4 F (36.9 C)-98.7 F (37.1 C)] 98.4 F (36.9 C) (06/01 0747) Pulse Rate:  [70-92] 75 (06/01 0800) Cardiac Rhythm: Normal sinus rhythm (06/01 0758) Resp:  [13-21] 18 (06/01 0800) BP: (109-141)/(73-99) 134/84 (06/01 0800) SpO2:  [96 %-100 %] 97 % (06/01 0800) Weight:  [65.6 kg (144 lb 10 oz)] 65.6 kg (144 lb 10 oz) (06/01 0404)  CBC:  Recent Labs Lab 10/15/16 2154 10/15/16 2214  WBC 10.7*  --   NEUTROABS 7.3  --   HGB 14.0 14.6  HCT 42.7 43.0  MCV 93.2  --   PLT 211  --     Basic Metabolic Panel:  Recent Labs Lab 10/15/16 2154 10/15/16 2214 10/16/16 0543  NA 141 143 142  K 3.8 3.8 4.1  CL 111 110 112*  CO2 20*  --  20*  GLUCOSE 133* 134* 88  BUN 15 18 17   CREATININE 1.66* 1.70* 1.58*  CALCIUM 10.6*  --  10.2   HgbA1c:  Lab Results  Component Value Date   HGBA1C  06/08/2007    5.6 (NOTE)   The ADA recommends the following therapeutic goals for glycemic   control related to Hgb A1C measurement:   Goal of Therapy:   < 7.0% Hgb A1C   Action Suggested:  > 8.0% Hgb A1C   Ref:  Diabetes Care, 22, Suppl. 1, 1999     PHYSICAL EXAM Pleasant middle-age lady not in distress. . Afebrile. Head is nontraumatic. Neck is supple without bruit.    Cardiac exam no murmur or gallop. Lungs are clear to auscultation. Distal pulses are well felt. Neurological Exam ;  Awake  Alert oriented x 3. Normal speech and language.eye movements full without nystagmus.fundi were not visualized. Vision acuity appears normal. Dense left homonymous hemianopsia Hearing is normal. Palatal movements are normal. Face symmetric. Tongue midline. Normal strength, tone, reflexes and coordination. Normal sensation. Gait deferred.  ASSESSMENT/PLAN [name] is a 71 y.o. female  with history of HA, syncope and disorientation after fall. [facility] neg and d/c home. [facility] with HA and L facial droop. CT shows a R occipital infarct. She did not receive IV t-PA due to being out of time window.   Stroke:  right occipital and L cerebellar infarcts,  Embolic secondary to unknown source  Resultant  left hemianopsia  CT evolving R occipital infarct. Scattered Small vessel disease.   MRI  R PCA infarct. small L cerebellum infarct  MRA  Severe R PCA P2-P3 stenosis  Carotid Doppler  1-39 percent stenosis involving the right internal carotid artery and the left internal carotid artery. The vertebral arteries demonstrate antegrade flow.  2D Echo  pending  LDL 73  HgbA1c pending  Lovenox 30 mg sq daily for VTE prophylaxis  Diet NPO time specified  No antithrombotic prior to admission, now on aspirin 300 mg suppository daily  Therapy recommendations:  pending   Disposition:  pending   Hypertension  Stable, highest BP 141/84 Permissive hypertension (OK if < 220/120) but gradually normalize in 5-7 days Long-term BP goal normotensive  Hyperlipidemia  Home meds:  crestor 10, resumed in hospital  LDL 73 , just above goal  Continue statin at discharge  Other Stroke Risk Factors  Advanced age  Cigarette smoker  UDS / ETOH level not performed   Other Active Problems  COPD  Hospital day # 0  I have personally examined this patient, reviewed notes, independently viewed imaging studies, participated in medical decision making and plan of care.ROS completed by me personally and pertinent positives fully documented  I have made any additions or clarifications directly to the above note. She has presented with embolic right occipital infarct etiology to be determined but strong suspicion for cardioembolic embolism. Recommend check transesophageal echocardiogram and if negative loop recorder insertion for paroxysmal atrial  fibrillation. Aspirin and Plavix for stroke prevention for 3 months. Long discussion the patient and answered questions. Discussed with Dr. Janee Morn.and [place] Greater than 50% time during this 35 minute visit was spent on counseling and coordination of care about cryptogenic stroke, stroke evaluation and treatment discussion and answered questions.  Delia Heady, MD Medical Director [facility] Pager: [phone] 10/16/2016 3:52 PM   To contact Stroke Continuity provider, please refer to [email or web address]. After hours, contact General Neurology

## 2016-10-16 NOTE — Progress Notes (Signed)
Pt arrived on unit. Paged [facility] admitting. Awaiting orders. Pt resting comfortably. Will continue to monitor.

## 2016-10-16 NOTE — H&P (Signed)
History and Physical    [name]:[id number] DOB: 07/29/1945 DOA: 10/15/2016  PCP: Rinaldo Cloud, MD  Patient coming from: Home.  Chief Complaint: Headache and visual defects on the left side of the visual field.  HPI: [name] is a 71 y.o. female with history of hypertension COPD ongoing tobacco abuse presents to the ER with complaints of visual defects on the left side of the visual field. Patient had come to the ER 3 days ago with headache and at that time CT scan was negative. Patient's daughter noticed that patient has been having increasing difficulty seeing things on the left side with persistent headache and was brought to the ER again yesterday. Patient denies any weakness of the extremities though on exam patient has left-sided weakness. Patient otherwise denies any difficulty walking and talking or swallowing.   ED Course: In the ER CT head shows evolving stroke with MRI confirming acute stroke involving the right posterior cerebral artery area with stroke also involving the left cerebellar area. On-call neurologist Dr. Amada Jupiter was consulted and patient is being admitted for further stroke workup. On exam patient has left-sided visual field defects. Patient also has weakness of the left upper and lower extremity. Patient was not a candidate for TPA due to time of onset was beyond the window period.  Review of Systems: As per HPI, rest all negative.   Past Medical History:  Diagnosis Date  . ALLERGIC RHINITIS   . Arthritis   . ASTHMA, UNSPECIFIED, UNSPECIFIED STATUS   . DYSLIPIDEMIA   . Headache(784.0)   . HYPERTENSION   . Hypertension   . Mild renal insufficiency   . PNA (pneumonia)   . Shortness of breath dyspnea   . SPINAL STENOSIS, LUMBAR    MRI 12/2005; surg 2004  . TOBACCO ABUSE     Past Surgical History:  Procedure Laterality Date  . ANTERIOR LAT LUMBAR FUSION Right 12/30/2015   Procedure: Lumbar two-three, Lumbar three-four Anterior  Lateral Lumbar Interbody Fusion with Anterior column realignment at Lumbar three-four;  Surgeon: [name], MD;  Location: [facility];  Service: Neurosurgery;  Laterality: Right;  . Closure of bronchopleural fistula, right lower lobe.    [name] COLONOSCOPY    . Decompressive Laminectomy     Fusion L4-5, L5-S1 (Cram-2004)  . Decortication of right lower lobe    . Placement of wound On-Q analgesia irrigation system.    [name] POSTERIOR LUMBAR FUSION 4 LEVEL N/A 12/30/2015   Procedure: Lumbar two -Sacral one Dorsal Internal Fixation and Fusion, [name] osteotomies Lumbar two-three, Lumbar three-four;  Surgeon: [name], MD;  Location: [facility];  Service: Neurosurgery;  Laterality: N/A;  . Right hand  1995  . Right knee  1990  . Right video-assisted thoracoscopic surgery, drainage of empyema.  02/12/2011     reports that she has been smoking Cigarettes.  She has a 10.00 pack-year smoking history. She has never used smokeless tobacco. She reports that she does not drink alcohol or use drugs.  Allergies  Allergen Reactions  . Shrimp [Shellfish Allergy] Hives and Swelling    Family History  Problem Relation Age of Onset  . Mental illness Mother   . Hypertension Mother   . Arthritis Other   . Hypertension Other     Prior to Admission medications   Medication Sig Start Date End Date Taking? Authorizing Provider  albuterol (PROVENTIL HFA;VENTOLIN HFA) 108 (90 Base) MCG/ACT inhaler Inhale 2 puffs into the lungs every  4 (four) hours as needed for wheezing or shortness of breath. 08/30/16  Yes Lavera Guise, MD  diltiazem (DILACOR XR) 240 MG 24 hr capsule Take 240 mg by mouth daily.   Yes [provider]  escitalopram (LEXAPRO) 10 MG tablet Take 10 mg by mouth daily.   Yes [provider]  Fluticasone-Salmeterol (ADVAIR DISKUS) 250-50 MCG/DOSE AEPB Inhale 1 puff into the lungs 2 (two) times daily.     Yes [provider]  losartan (COZAAR) 100  MG tablet Take 1 tablet by mouth daily. 07/27/16  Yes [provider]  rosuvastatin (CRESTOR) 10 MG tablet Take 10 mg by mouth daily.    Yes [provider]  SINGULAIR 10 MG tablet Take 1 tablet by mouth daily. 07/02/16  Yes [provider]  thiamine (VITAMIN B-1) 100 MG tablet Take 100 mg by mouth daily. 04/30/16  Yes [provider]  tiotropium (SPIRIVA) 18 MCG inhalation capsule Place 18 mcg into inhaler and inhale daily.     Yes [provider]  Vitamin D, Ergocalciferol, (DRISDOL) 50000 units CAPS capsule Take 50,000 Units by mouth once a week. 07/14/16  Yes [provider]  fluconazole (DIFLUCAN) 150 MG tablet take 1 tablet by mouth AS A SINGLE DOSE Patient not taking: Reported on 10/16/2016 09/24/16   Anyanwu, Jethro Bastos, MD  predniSONE (DELTASONE) 10 MG tablet Take 5 tablets (50 mg total) by mouth daily. Patient not taking: Reported on 10/16/2016 08/31/16   Lavera Guise, MD    Physical Exam: Vitals:   10/16/16 0215 10/16/16 0230 10/16/16 0404 10/16/16 0500  BP: 115/86 116/82 128/81 112/78  Pulse: 72 78  70  Resp: 15 15 20 17   Temp:   98.5 F (36.9 C)   TempSrc:   Oral   SpO2: 98% 98%  96%  Weight:   65.6 kg (144 lb 10 oz)   Height:   5\' 6"  (1.676 m)       Constitutional: Moderately built and nourished. Vitals:   10/16/16 0215 10/16/16 0230 10/16/16 0404 10/16/16 0500  BP: 115/86 116/82 128/81 112/78  Pulse: 72 78  70  Resp: 15 15 20 17   Temp:   98.5 F (36.9 C)   TempSrc:   Oral   SpO2: 98% 98%  96%  Weight:   65.6 kg (144 lb 10 oz)   Height:   5\' 6"  (1.676 m)    Eyes: Anicteric. No pallor. ENMT: No discharge from the ears eyes nose or mouth. Neck: No mass felt. No neck rigidity. Respiratory: No rhonchi or crepitations. Cardiovascular: S1 and S2 heard no murmurs appreciated. Abdomen: Soft nontender bowel sounds present. Musculoskeletal: No edema. No joint effusion. Skin: No rash. Skin appears warm. Neurologic: Alert  awake oriented to time place and person. Left-sided facial field defects. Mild weakness of the left upper and lower extremities about 4 x 5 strength. Right upper and lower extremities 5 x 5 in strength. No facial asymmetry and no tongue deviation. Psychiatric: Appears normal. Normal affect.   Labs on Admission: I have personally reviewed following labs and imaging studies  CBC:  Recent Labs Lab 10/15/16 2154 10/15/16 2214  WBC 10.7*  --   NEUTROABS 7.3  --   HGB 14.0 14.6  HCT 42.7 43.0  MCV 93.2  --   PLT 211  --    Basic Metabolic Panel:  Recent Labs Lab 10/15/16 2154 10/15/16 2214  NA 141 143  K 3.8 3.8  CL 111 110  CO2  20*  --   GLUCOSE 133* 134*  BUN 15 18  CREATININE 1.66* 1.70*  CALCIUM 10.6*  --    GFR: Estimated Creatinine Clearance: 29.2 mL/min (A) (by C-G formula based on SCr of 1.7 mg/dL (H)). Liver Function Tests:  Recent Labs Lab 10/15/16 2154  AST 21  ALT 11*  ALKPHOS 58  BILITOT 0.6  PROT 7.4  ALBUMIN 3.6   No results for input(s): LIPASE, AMYLASE in the last 168 hours. No results for input(s): AMMONIA in the last 168 hours. Coagulation Profile:  Recent Labs Lab 10/15/16 2154  INR 1.10   Cardiac Enzymes: No results for input(s): CKTOTAL, CKMB, CKMBINDEX, TROPONINI in the last 168 hours. BNP (last 3 results) No results for input(s): PROBNP in the last 8760 hours. HbA1C: No results for input(s): HGBA1C in the last 72 hours. CBG: No results for input(s): GLUCAP in the last 168 hours. Lipid Profile: No results for input(s): CHOL, HDL, LDLCALC, TRIG, CHOLHDL, LDLDIRECT in the last 72 hours. Thyroid Function Tests: No results for input(s): TSH, T4TOTAL, FREET4, T3FREE, THYROIDAB in the last 72 hours. Anemia Panel: No results for input(s): VITAMINB12, FOLATE, FERRITIN, TIBC, IRON, RETICCTPCT in the last 72 hours. Urine analysis:    Component Value Date/Time   COLORURINE YELLOW 03/19/2016 2350   APPEARANCEUR CLEAR 03/19/2016 2350    LABSPEC 1.010 03/19/2016 2350   PHURINE 5.0 03/19/2016 2350   GLUCOSEU NEGATIVE 03/19/2016 2350   HGBUR NEGATIVE 03/19/2016 2350   BILIRUBINUR NEGATIVE 03/19/2016 2350   KETONESUR NEGATIVE 03/19/2016 2350   PROTEINUR NEGATIVE 03/19/2016 2350   UROBILINOGEN 0.2 09/21/2014 1051   NITRITE NEGATIVE 03/19/2016 2350   LEUKOCYTESUR NEGATIVE 03/19/2016 2350   Sepsis Labs: @LABRCNTIP (procalcitonin:4,lacticidven:4) )No results found for this or any previous visit (from the past 240 hour(s)).   Radiological Exams on Admission: Ct Head Wo Contrast  Result Date: 10/15/2016 CLINICAL DATA:  Acute onset of headache and dizziness, after fall. Initial encounter. EXAM: CT HEAD WITHOUT CONTRAST TECHNIQUE: Contiguous axial images were obtained from the base of the skull through the vertex without intravenous contrast. COMPARISON:  CT of the head performed 10/13/2016 FINDINGS: Brain: There is an evolving acute infarct at the right occipital lobe, with associated mass-effect. There is no evidence of hemorrhagic transformation. No definite mass is identified. Scattered periventricular and subcortical white matter change likely reflects small vessel ischemic microangiopathy. The brainstem and fourth ventricle are within normal limits. The basal ganglia are unremarkable in appearance. No midline shift is seen. Vascular: No hyperdense vessel or unexpected calcification. Skull: There is no evidence of fracture; visualized osseous structures are unremarkable in appearance. Sinuses/Orbits: The orbits are within normal limits. The paranasal sinuses and mastoid air cells are well-aerated. Other: No significant soft tissue abnormalities are seen. IMPRESSION: 1. Evolving acute infarct at the right occipital lobe, with associated mass effect. No evidence of hemorrhagic transformation. 2. Scattered small vessel ischemic microangiopathy. These results were called by telephone at the time of interpretation on 10/15/2016 at 10:59 pm to  Dr. Gwyneth SproutWHITNEY PLUNKETT, who verbally acknowledged these results. Electronically Signed   By: Roanna RaiderJeffery  Chang M.D.   On: 10/15/2016 23:01   Mr Maxine GlennMra Head Wo Contrast  Result Date: 10/16/2016 CLINICAL DATA:  Headaches and visual changes. EXAM: MRI HEAD WITHOUT CONTRAST MRA HEAD WITHOUT CONTRAST TECHNIQUE: Multiplanar, multiecho pulse sequences of the brain and surrounding structures were obtained without intravenous contrast. Angiographic images of the head were obtained using MRA technique without contrast. COMPARISON:  1. Head CT 10/15/2016 2. Brain MRI  08/06/2016 FINDINGS: MRI HEAD FINDINGS Brain: The midline structures are normal. There is diffusion restriction throughout the right posterior cerebral artery distribution. There is a small focus of diffusion restriction in the left cerebellum measuring approximately 10 mm. The diffusion restriction extends into the lateral aspect of the right thalamus. There is associated hyperintense T2 weighted signal at the above-described locations. There is confluent hyperintense T2-weighted signal within the periventricular white matter, most often seen in the setting of chronic microvascular ischemia. No intraparenchymal hematoma or chronic microhemorrhage. Brain volume is normal for age without age-advanced or lobar predominant atrophy. The dura is normal and there is no extra-axial collection. Skull and upper cervical spine: The visualized skull base, calvarium, upper cervical spine and extracranial soft tissues are normal. Sinuses/Orbits: No fluid levels or advanced mucosal thickening. No mastoid effusion. Normal orbits. MRA HEAD FINDINGS The time-of-flight images are degraded by motion. Intracranial internal carotid arteries: Normal. Anterior cerebral arteries: Normal. Middle cerebral arteries: Multifocal atherosclerotic narrowing on the right. No advanced left MCA stenosis. Posterior communicating arteries: Absent bilaterally. Posterior cerebral arteries: There is loss of  the normal flow related enhancement of the right posterior cerebral artery near the P2 P3 junction. Basilar artery: Normal. Vertebral arteries: Left dominant. Normal. Superior cerebellar arteries: Normal. Anterior inferior cerebellar arteries: Not clearly visualized. Posterior inferior cerebellar arteries: Normal. IMPRESSION: 1. Acute ischemia throughout the right posterior cerebral artery distribution without hemorrhage or significant mass effect. 2. Small focus of acute ischemia in the left cerebellum. Bilateral distribution of lesions may indicate a central embolic process. 3. Severe stenosis or occlusion of the right posterior cerebral artery near the junction of the P2 and P3 segments. 4. Otherwise motion-degraded MRA without large vessel occlusion. Electronically Signed   By: Deatra Robinson M.D.   On: 10/16/2016 04:03   Mr Brain Wo Contrast  Result Date: 10/16/2016 CLINICAL DATA:  Headaches and visual changes. EXAM: MRI HEAD WITHOUT CONTRAST MRA HEAD WITHOUT CONTRAST TECHNIQUE: Multiplanar, multiecho pulse sequences of the brain and surrounding structures were obtained without intravenous contrast. Angiographic images of the head were obtained using MRA technique without contrast. COMPARISON:  1. Head CT 10/15/2016 2. Brain MRI 08/06/2016 FINDINGS: MRI HEAD FINDINGS Brain: The midline structures are normal. There is diffusion restriction throughout the right posterior cerebral artery distribution. There is a small focus of diffusion restriction in the left cerebellum measuring approximately 10 mm. The diffusion restriction extends into the lateral aspect of the right thalamus. There is associated hyperintense T2 weighted signal at the above-described locations. There is confluent hyperintense T2-weighted signal within the periventricular white matter, most often seen in the setting of chronic microvascular ischemia. No intraparenchymal hematoma or chronic microhemorrhage. Brain volume is normal for age  without age-advanced or lobar predominant atrophy. The dura is normal and there is no extra-axial collection. Skull and upper cervical spine: The visualized skull base, calvarium, upper cervical spine and extracranial soft tissues are normal. Sinuses/Orbits: No fluid levels or advanced mucosal thickening. No mastoid effusion. Normal orbits. MRA HEAD FINDINGS The time-of-flight images are degraded by motion. Intracranial internal carotid arteries: Normal. Anterior cerebral arteries: Normal. Middle cerebral arteries: Multifocal atherosclerotic narrowing on the right. No advanced left MCA stenosis. Posterior communicating arteries: Absent bilaterally. Posterior cerebral arteries: There is loss of the normal flow related enhancement of the right posterior cerebral artery near the P2 P3 junction. Basilar artery: Normal. Vertebral arteries: Left dominant. Normal. Superior cerebellar arteries: Normal. Anterior inferior cerebellar arteries: Not clearly visualized. Posterior inferior cerebellar arteries: Normal. IMPRESSION: 1.  Acute ischemia throughout the right posterior cerebral artery distribution without hemorrhage or significant mass effect. 2. Small focus of acute ischemia in the left cerebellum. Bilateral distribution of lesions may indicate a central embolic process. 3. Severe stenosis or occlusion of the right posterior cerebral artery near the junction of the P2 and P3 segments. 4. Otherwise motion-degraded MRA without large vessel occlusion. Electronically Signed   By: Deatra Robinson M.D.   On: 10/16/2016 04:03    EKG: Independently reviewed. Normal sinus rhythm.  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 Acute ischemic stroke - appreciate neurology consult. Discussed with Dr. Amada Jupiter on call neurologist. Patient is on  aspirin. Check hemoglobin A1c and lipid panel physical therapy consult carotid Doppler and 2-D echo. Follow telemetry for any arrhythmias. 2. Hypertension - continue home medications but allow for permissive hypertension. 3. COPD not actively wheezing. 4. Tobacco abuse - patient advised to quit smoking.   DVT prophylaxis: Lovenox. Code Status: Full code.  Family Communication: Discussed with patient.  Disposition Plan: Home.  Consults called: Neurology.  Admission status: Observation.    Eduard Clos MD [facility] Pager [phone].  If 7PM-7AM, please contact night-coverage [email or web address] Password [email or web address]  10/16/2016, 5:41 AM

## 2016-10-16 NOTE — Progress Notes (Signed)
**  Preliminary report by tech**  Carotid artery duplex complete. Findings are consistent with a 1-39 percent stenosis involving the right internal carotid artery and the left internal carotid artery. The vertebral arteries demonstrate antegrade flow.  10/16/16 2:46 PM Olen CordialGreg Pearle Wandler RVT
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
## 2016-10-16 NOTE — Progress Notes (Signed)
Rehab Admissions Coordinator Note:  Patient was screened by Clois DupesBoyette, Angles Trevizo Godwin for appropriateness for an Inpatient Acute Rehab Consult per PT recommendation. Noted pt under observation status.  At this time, we are recommending await functional progress over the weekend to determine dispo options needed. Pt admitted today. [phone].  Clois DupesBoyette, Zahlia Deshazer Godwin 10/16/2016, 4:02 PM  I can be reached at [phone].

## 2016-10-16 NOTE — Consult Note (Signed)
Ref: Rinaldo Cloud, MD   Subjective:  Awaiting TEE. Schedule full today. Patient has Monday morning 1st case for TEE.   Objective:  Vital Signs in the last 24 hours: Temp:  [98.4 F (36.9 C)-98.8 F (37.1 C)] 98.8 F (37.1 C) (06/01 1221) Pulse Rate:  [70-92] 76 (06/01 1221) Cardiac Rhythm: Normal sinus rhythm (06/01 0758) Resp:  [13-21] 17 (06/01 1221) BP: (109-141)/(73-99) 140/89 (06/01 1221) SpO2:  [96 %-100 %] 97 % (06/01 1221) Weight:  [65.6 kg (144 lb 10 oz)] 65.6 kg (144 lb 10 oz) (06/01 0404)  Physical Exam: BP Readings from Last 1 Encounters:  10/16/16 140/89    Wt Readings from Last 1 Encounters:  10/16/16 65.6 kg (144 lb 10 oz)    Weight change:  Body mass index is 23.34 kg/m. HEENT: [place]/AT, Eyes-Brown, PERL, EOMI, Conjunctiva-Pink, Sclera-Non-icteric Neck: No JVD, No bruit, Trachea midline. Lungs:  Clear, Bilateral. Cardiac:  Regular rhythm, normal S1 and S2, no S3. II/VI systolic murmur. Abdomen:  Soft, non-tender. BS present. Extremities:  No edema present. No cyanosis. No clubbing. CNS: AxOx3, Cranial nerves grossly intact except Left visual field deficit. She moves all 4 extremities.  Skin: Warm and dry.   Intake/Output from previous day: 05/31 0701 - 06/01 0700 In: 28.3 [I.V.:28.3] Out: -     Lab Results: BMET    Component Value Date/Time   NA 142 10/16/2016 0543   NA 143 10/15/2016 2214   NA 141 10/15/2016 2154   NA 140 02/26/2016 1512   K 4.1 10/16/2016 0543   K 3.8 10/15/2016 2214   K 3.8 10/15/2016 2154   CL 112 (H) 10/16/2016 0543   CL 110 10/15/2016 2214   CL 111 10/15/2016 2154   CO2 20 (L) 10/16/2016 0543   CO2 20 (L) 10/15/2016 2154   CO2 24 08/30/2016 2039   GLUCOSE 88 10/16/2016 0543   GLUCOSE 134 (H) 10/15/2016 2214   GLUCOSE 133 (H) 10/15/2016 2154   BUN 17 10/16/2016 0543   BUN 18 10/15/2016 2214   BUN 15 10/15/2016 2154   BUN 24 02/26/2016 1512   CREATININE 1.58 (H) 10/16/2016 0543   CREATININE 1.70 (H) 10/15/2016  2214   CREATININE 1.66 (H) 10/15/2016 2154   CALCIUM 10.2 10/16/2016 0543   CALCIUM 10.6 (H) 10/15/2016 2154   CALCIUM 9.9 08/30/2016 2039   GFRNONAA 32 (L) 10/16/2016 0543   GFRNONAA 30 (L) 10/15/2016 2154   GFRNONAA 36 (L) 08/30/2016 2039   GFRAA 37 (L) 10/16/2016 0543   GFRAA 35 (L) 10/15/2016 2154   GFRAA 42 (L) 08/30/2016 2039   CBC    Component Value Date/Time   WBC 10.7 (H) 10/15/2016 2154   RBC 4.58 10/15/2016 2154   HGB 14.6 10/15/2016 2214   HCT 43.0 10/15/2016 2214   HCT 35.7 02/26/2016 1512   PLT 211 10/15/2016 2154   PLT 286 02/26/2016 1512   MCV 93.2 10/15/2016 2154   MCV 89 02/26/2016 1512   MCH 30.6 10/15/2016 2154   MCHC 32.8 10/15/2016 2154   RDW 14.5 10/15/2016 2154   RDW 13.4 02/26/2016 1512   LYMPHSABS 2.3 10/15/2016 2154   MONOABS 0.8 10/15/2016 2154   EOSABS 0.3 10/15/2016 2154   BASOSABS 0.0 10/15/2016 2154   HEPATIC Function Panel  Recent Labs  08/30/16 2039 10/15/16 2154 10/16/16 0543  PROT 6.5 7.4 6.7   HEMOGLOBIN A1C No components found for: HGA1C,  MPG CARDIAC ENZYMES Lab Results  Component Value Date   CKTOTAL 89 03/20/2016  TROPONINI 0.13 (HH) 03/20/2016   TROPONINI 0.03 (HH) 03/20/2016   TROPONINI 0.03 (HH) 03/20/2016   BNP No results for input(s): PROBNP in the last 8760 hours. TSH  Recent Labs  03/21/16 0608  TSH 0.776   CHOLESTEROL  Recent Labs  10/16/16 0543  CHOL 130    Scheduled Meds: .  stroke: mapping our early stages of recovery book   Does not apply Once  . aspirin  300 mg Rectal Daily   Or  . aspirin  325 mg Oral Daily  . Chlorhexidine Gluconate Cloth  6 each Topical Q0600  . clopidogrel  75 mg Oral Daily  . diltiazem  240 mg Oral Daily  . enoxaparin (LOVENOX) injection  30 mg Subcutaneous Q24H  . escitalopram  10 mg Oral Daily  . losartan  100 mg Oral Daily  . mometasone-formoterol  2 puff Inhalation BID  . montelukast  10 mg Oral Daily  . mupirocin ointment  1 application Nasal BID  .  nicotine  21 mg Transdermal Daily  . rosuvastatin  10 mg Oral Daily  . thiamine  100 mg Oral Daily  . tiotropium  18 mcg Inhalation Daily  . [START ON 10/19/2016] Vitamin D (Ergocalciferol)  50,000 Units Oral Weekly   Continuous Infusions: . 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 Hypertension Hyperlipidemia COPD Tobacco abuse CKD, II  TEE on Monday.   LOS: 0 days    Orpah CobbAjay Westin Knotts  MD  10/16/2016, 3:14 PM

## 2016-10-16 NOTE — ED Provider Notes (Signed)
[facility]-EMER[facility]ENCY DEPT Provider Note   CSN: [id number] Arrival date & time: 10/15/16  2136  By signing my name below, I, [name], attest that this documentation has been prepared under the direction and in the presence of [name], [name], *. Electronically Signed: Elder Davis, Scribe. 10/16/16. 12:33 AM.   History   Chief Complaint Chief Complaint  Patient presents with  . Dizziness  . Stroke Symptoms    HPI [name] is a 71 y.o. female with history of HTN, dyslipidemia, and CKD who presents to the ED with neurologic complaints. This patient was initially seen 3 days ago this facility for a headache with photophobia. She had a CT head at that time which was negative. She states that in the last 24 hours she has continued to experienced a headache with vision problems, occasionally dropping objects, abnormal balance, and occasional pre-syncope. She also reports a fall with swelling to the L forehead; unclear when this fall occurred or the exact mechanism. Denies any loss of function or sensation distally.  The history is provided by the patient. No language interpreter was used.  Headache   This is a new problem. Episode onset: 3 days. The problem occurs constantly. The problem has not changed since onset.Associated with: multiple neurologic complaints per HPI.    Past Medical History:  Diagnosis Date  . ALLERGIC RHINITIS   . Arthritis   . ASTHMA, UNSPECIFIED, UNSPECIFIED STATUS   . DYSLIPIDEMIA   . Headache(784.0)   . HYPERTENSION   . Hypertension   . Mild renal insufficiency   . PNA (pneumonia)   . Shortness of breath dyspnea   . SPINAL STENOSIS, LUMBAR    MRI 12/2005; surg 2004  . TOBACCO ABUSE     Patient Active Problem List   Diagnosis Date Noted  . Elevated troponin   . Altered mental status 03/19/2016  . Acute on chronic renal failure (HCC) 01/13/2016  . Neuropathic pain   . Anxiety state   . Sleep disturbance   . Post-operative pain     . Spondylolisthesis of lumbar region 01/02/2016  . Surgery, elective   . Chronic obstructive pulmonary disease (HCC)   . Respiratory failure with hypercapnia (HCC)   . Chronic back pain   . Postoperative anemia due to acute blood loss   . Tachycardia   . Leukocytosis   . Thrombocytopenia (HCC)   . Lumbosacral spondylosis with radiculopathy 12/30/2015  . Acute encephalopathy   . AKI (acute kidney injury) (HCC)   . CKD (chronic kidney disease)   . HEADACHE 08/16/2009  . DYSLIPIDEMIA 02/20/2009  . TOBACCO ABUSE 02/20/2009  . Essential hypertension 02/20/2009  . ALLERGIC RHINITIS 02/20/2009  . Asthma 02/20/2009  . SHOULDER PAIN, RIGHT, CHRONIC 02/20/2009  . HIP PAIN, RIGHT, CHRONIC 02/20/2009  . SPINAL STENOSIS, CERVICAL 02/20/2009  . SPINAL STENOSIS, LUMBAR 02/20/2009  . BACK PAIN, LUMBAR 02/20/2009    Past Surgical History:  Procedure Laterality Date  . ANTERIOR LAT LUMBAR FUSION Right 12/30/2015   Procedure: Lumbar two-three, Lumbar three-four Anterior Lateral Lumbar Interbody Fusion with Anterior column realignment at Lumbar three-four;  Surgeon: [name], MD;  Location: [facility];  Service: Neurosurgery;  Laterality: Right;  . Closure of bronchopleural fistula, right lower lobe.    [name] COLONOSCOPY    . Decompressive Laminectomy     Fusion L4-5, L5-S1 ([facility]-2004)  . Decortication of right lower lobe    . Placement of wound On-Q analgesia irrigation system.    [name] POSTERIOR LUMBAR  FUSION 4 LEVEL N/A 12/30/2015   Procedure: Lumbar two -Sacral one Dorsal Internal Fixation and Fusion, [name] osteotomies Lumbar two-three, Lumbar three-four;  Surgeon: Loura HaltBenjamin Jared Ditty, MD;  Location: [facility];  Service: Neurosurgery;  Laterality: N/A;  . Right hand  1995  . Right knee  1990  . Right video-assisted thoracoscopic surgery, drainage of empyema.  02/12/2011    OB History    Gravida Para Term Preterm AB Living   5 4 4   1 4    SAB TAB Ectopic Multiple Live  Births     1             Home Medications    Prior to Admission medications   Medication Sig Start Date End Date Taking? Authorizing Provider  albuterol (PROVENTIL HFA;VENTOLIN HFA) 108 (90 BASE) MCG/ACT inhaler Inhale 2 puffs into the lungs every 6 (six) hours as needed for wheezing or shortness of breath.     [provider]  albuterol (PROVENTIL HFA;VENTOLIN HFA) 108 (90 Base) MCG/ACT inhaler Inhale 2 puffs into the lungs every 4 (four) hours as needed for wheezing or shortness of breath. 08/30/16   Lavera GuiseLiu, Dana Duo, MD  alendronate (FOSAMAX) 70 MG tablet Take 1 tablet by mouth every Wednesday.  07/14/16   [provider]  amLODipine (NORVASC) 5 MG tablet Take 5 mg by mouth daily. 07/08/16   [provider]  cetirizine (ZYRTEC) 10 MG tablet Take 10 mg by mouth daily.    [provider]  fluconazole (DIFLUCAN) 150 MG tablet take 1 tablet by mouth AS A SINGLE DOSE 09/24/16   Anyanwu, Jethro BastosUgonna A, MD  Fluticasone-Salmeterol (ADVAIR DISKUS) 250-50 MCG/DOSE AEPB Inhale 1 puff into the lungs 2 (two) times daily.      [provider]  losartan (COZAAR) 100 MG tablet Take 1 tablet by mouth daily. 07/27/16   [provider]  metoprolol (LOPRESSOR) 50 MG tablet Take 50 mg by mouth 2 (two) times daily.    [provider]  predniSONE (DELTASONE) 10 MG tablet Take 5 tablets (50 mg total) by mouth daily. 08/31/16   Lavera GuiseLiu, Dana Duo, MD  rosuvastatin (CRESTOR) 10 MG tablet Take 10 mg by mouth daily.     [provider]  SINGULAIR 10 MG tablet Take 1 tablet by mouth daily. 07/02/16   [provider]  thiamine (VITAMIN B-1) 100 MG tablet Take 100 mg by mouth daily. 04/30/16   [provider]  tiotropium (SPIRIVA) 18 MCG inhalation capsule Place 18 mcg into inhaler and inhale daily.      [provider]  Vitamin D, Ergocalciferol, (DRISDOL) 50000 units CAPS capsule Take 50,000 Units by mouth once a week. 07/14/16   [provider]    Family History Family History  Problem Relation Age of Onset  . Mental illness Mother   . Hypertension Mother   . Arthritis Other   . Hypertension Other     Social History Social History  Substance Use Topics  . Smoking status: Current Every Day Smoker    Packs/day: 0.25    Years: 40.00    Types: Cigarettes  . 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 Alcohol use No     Allergies   Shrimp [shellfish allergy]   Review of Systems Review of Systems  Neurological: Positive for headaches. Negative for weakness and numbness.       Vision changes,  abnormal balance, dropping objects  All other systems reviewed and are negative.    Physical Exam Updated Vital Signs BP 109/80   Pulse 85   Temp 98.6 F (37 C)   Resp 17   SpO2 98%   Physical Exam  Constitutional: She is oriented to person, place, and time. She appears well-developed and well-nourished. No distress.  HENT:  Head: Normocephalic.  Right Ear: Hearing normal.  Left Ear: Hearing normal.  Nose: Nose normal.  Mouth/Throat: Oropharynx is clear and moist and mucous membranes are normal.  There is a 2cm contusion to the L forehead.  Eyes: Conjunctivae and EOM are normal. Pupils are equal, round, and reactive to light.  Neck: Normal range of motion. Neck supple.  Cardiovascular: Regular rhythm, S1 normal and S2 normal.  Exam reveals no gallop and no friction rub.   No murmur heard. Pulmonary/Chest: Effort normal and breath sounds normal. No respiratory distress. She exhibits no tenderness.  Abdominal: Soft. Normal appearance and bowel sounds are normal. There is no hepatosplenomegaly. There is no tenderness. There is no rebound, no guarding, no tenderness at McBurney's point and negative Murphy's sign. No hernia.  Musculoskeletal: Normal range of motion.  Neurological: She is alert and oriented to person, place, and time.  She has normal strength. No cranial nerve deficit or sensory deficit. Coordination normal. GCS eye subscore is 4. GCS verbal subscore is 5. GCS motor subscore is 6.  Homonymous hemianopsia (left visual field deficit)  Skin: Skin is warm, dry and intact. No rash noted. No cyanosis.  Psychiatric: She has a normal mood and affect. Her speech is normal and behavior is normal. Thought content normal.  Nursing note and vitals reviewed.    ED Treatments / Results  Labs (all labs ordered are listed, but only abnormal results are displayed) Labs Reviewed  CBC - Abnormal; Notable for the following:       Result Value   WBC 10.7 (*)    All other components within normal limits  COMPREHENSIVE METABOLIC PANEL - Abnormal; Notable for the following:    CO2 20 (*)    Glucose, Bld 133 (*)    Creatinine, Ser 1.66 (*)    Calcium 10.6 (*)    ALT 11 (*)    GFR calc non Af Amer 30 (*)    GFR calc Af Amer 35 (*)    All other components within normal limits  I-STAT CHEM 8, ED - Abnormal; Notable for the following:    Creatinine, Ser 1.70 (*)    Glucose, Bld 134 (*)    All other components within normal limits  PROTIME-INR  APTT  DIFFERENTIAL  I-STAT TROPOININ, ED  CBG MONITORING, ED    EKG  EKG Interpretation None       Radiology Ct Head Wo Contrast  Result Date: 10/15/2016 CLINICAL DATA:  Acute onset of headache and dizziness, after fall. Initial encounter. EXAM: CT HEAD WITHOUT CONTRAST TECHNIQUE: Contiguous axial images were obtained from the base of the skull through the vertex without intravenous contrast. COMPARISON:  CT of the head performed 10/13/2016 FINDINGS: Brain: There is an evolving acute infarct at the right occipital lobe, with associated mass-effect. There is no evidence of hemorrhagic transformation. No definite mass is identified. Scattered periventricular and subcortical white matter change likely reflects small vessel ischemic microangiopathy. The brainstem and fourth  ventricle are within normal limits. The basal ganglia are unremarkable in appearance. No midline shift is seen. Vascular: No hyperdense vessel or unexpected calcification. Skull: There  is no evidence of fracture; visualized osseous structures are unremarkable in appearance. Sinuses/Orbits: The orbits are within normal limits. The paranasal sinuses and mastoid air cells are well-aerated. Other: No significant soft tissue abnormalities are seen. IMPRESSION: 1. Evolving acute infarct at the right occipital lobe, with associated mass effect. No evidence of hemorrhagic transformation. 2. Scattered small vessel ischemic microangiopathy. These results were called by telephone at the time of interpretation on 10/15/2016 at 10:59 pm to Dr. Gwyneth Sprout, who verbally acknowledged these results. Electronically Signed   By: Roanna Raider M.D.   On: 10/15/2016 23:01    Procedures Procedures (including critical care time)  Medications Ordered in ED Medications - No data to display   Initial Impression / Assessment and Plan / ED Course  I have reviewed the triage vital signs and the nursing notes.  Pertinent labs & imaging results that were available during my care of the patient were reviewed by me and considered in my medical decision making (see chart for details).     Patient presents to the emergency department for persistent headache. Patient seen in the ER 2 days ago with similar symptoms. Workup at that time was negative. Since she was seen in the ER she had some kind of fall, does not remember exactly what happened. She does have a contusion on the left side of her forehead.  Examination does not reveal any extremity weakness or sensory deficit. She does, however, have a left visual field deficit consistent with homonymous hemianopsia. CT scan shows right occipital infarct. Discussed with Dr. Amada Jupiter, neurology. He will see the patient.  Final Clinical Impressions(s) / ED Diagnoses   Final  diagnoses:  Cerebrovascular accident (CVA), unspecified mechanism (HCC)    New Prescriptions New Prescriptions   No medications on file  I personally performed the services described in this documentation, which was scribed in my presence. The recorded information has been reviewed and is accurate.    Gilda Crease, MD 10/16/16 [phone]

## 2016-10-16 NOTE — Care Management Note (Signed)
Case Management Note  Patient Details  Name: [name] MRN: [id number] Date of Birth: 1945/11/07  Subjective/Objective:   Pt admitted with stroke                 Action/Plan:  PTA from home.  CIR recommended - CSW consulted - however currently obs pt.     Expected Discharge Date:                  Expected Discharge Plan:     In-House Referral:  Clinical Social Work  Discharge planning Services  CM Consult  Post Acute Care Choice:    Choice offered to:     DME Arranged:    DME Agency:     HH Arranged:    HH Agency:     Status of Service:     If discussed at [employer] Meetings, dates discussed:    Additional Comments:  Cherylann ParrClaxton, Rees Matura S, RN 10/16/2016, 4:16 PM

## 2016-10-16 NOTE — ED Notes (Signed)
Patient transported to MRI 
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
## 2016-10-17 DIAGNOSIS — M549 Dorsalgia, unspecified: Secondary | ICD-10-CM

## 2016-10-17 DIAGNOSIS — G8929 Other chronic pain: Secondary | ICD-10-CM

## 2016-10-17 DIAGNOSIS — F411 Generalized anxiety disorder: Secondary | ICD-10-CM

## 2016-10-17 LAB — HEMOGLOBIN A1C
Hgb A1c MFr Bld: 5.4 % (ref 4.8–5.6)
Mean Plasma Glucose: 108 mg/dL

## 2016-10-17 LAB — BASIC METABOLIC PANEL
Anion gap: 7 (ref 5–15)
BUN: 13 mg/dL (ref 6–20)
CHLORIDE: 109 mmol/L (ref 101–111)
CO2: 21 mmol/L — ABNORMAL LOW (ref 22–32)
Calcium: 9.3 mg/dL (ref 8.9–10.3)
Creatinine, Ser: 1.29 mg/dL — ABNORMAL HIGH (ref 0.44–1.00)
GFR calc Af Amer: 48 mL/min — ABNORMAL LOW (ref >60)
GFR calc non Af Amer: 41 mL/min — ABNORMAL LOW (ref >60)
GLUCOSE: 72 mg/dL (ref 65–99)
POTASSIUM: 3.6 mmol/L (ref 3.5–5.1)
Sodium: 137 mmol/L (ref 135–145)

## 2016-10-17 LAB — CBC
HCT: 37.7 % (ref 36.0–46.0)
HEMOGLOBIN: 11.9 g/dL — AB (ref 12.0–15.0)
MCH: 29.5 pg (ref 26.0–34.0)
MCHC: 31.6 g/dL (ref 30.0–36.0)
MCV: 93.5 fL (ref 78.0–100.0)
Platelets: 178 10*3/uL (ref 150–400)
RBC: 4.03 MIL/uL (ref 3.87–5.11)
RDW: 14.3 % (ref 11.5–15.5)
WBC: 9 10*3/uL (ref 4.0–10.5)

## 2016-10-17 MED ORDER — ACETAMINOPHEN 325 MG PO TABS
650.0000 mg | ORAL_TABLET | Freq: Once | ORAL | Status: AC
  Administered 2016-10-17: 650 mg via ORAL
  Filled 2016-10-17: qty 2

## 2016-10-17 MED ORDER — POLYETHYLENE GLYCOL 3350 17 G PO PACK
17.0000 g | PACK | Freq: Every day | ORAL | Status: DC
  Administered 2016-10-18 – 2016-10-20 (×2): 17 g via ORAL
  Filled 2016-10-17 (×2): qty 1

## 2016-10-17 MED ORDER — SENNOSIDES-DOCUSATE SODIUM 8.6-50 MG PO TABS
1.0000 | ORAL_TABLET | Freq: Two times a day (BID) | ORAL | Status: DC
  Administered 2016-10-17 – 2016-10-20 (×5): 1 via ORAL
  Filled 2016-10-17 (×5): qty 1

## 2016-10-17 MED ORDER — SORBITOL 70 % SOLN
30.0000 mL | Freq: Once | Status: AC
  Administered 2016-10-17: 30 mL via ORAL
  Filled 2016-10-17: qty 30

## 2016-10-17 MED ORDER — LORAZEPAM 0.5 MG PO TABS
0.5000 mg | ORAL_TABLET | Freq: Two times a day (BID) | ORAL | Status: DC | PRN
  Administered 2016-10-17: 0.5 mg via ORAL
  Filled 2016-10-17: qty 1

## 2016-10-17 MED ORDER — MENTHOL 3 MG MT LOZG
1.0000 | LOZENGE | OROMUCOSAL | Status: DC | PRN
  Administered 2016-10-17 – 2016-10-18 (×2): 3 mg via ORAL
  Filled 2016-10-17 (×2): qty 9

## 2016-10-17 MED ORDER — LORAZEPAM 0.5 MG PO TABS
0.5000 mg | ORAL_TABLET | Freq: Three times a day (TID) | ORAL | Status: DC | PRN
  Administered 2016-10-17 – 2016-10-20 (×7): 0.5 mg via ORAL
  Filled 2016-10-17 (×10): qty 1

## 2016-10-17 MED ORDER — PROCHLORPERAZINE EDISYLATE 5 MG/ML IJ SOLN
10.0000 mg | Freq: Once | INTRAMUSCULAR | Status: AC
  Administered 2016-10-17: 10 mg via INTRAVENOUS
  Filled 2016-10-17: qty 2
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
## 2016-10-17 NOTE — Progress Notes (Signed)
Subjective:  Denies any chest pain or shortness of breath. Denies any palpitations. continues to have mild headache. Left visual field deficit persists. States overall feels better.  Objective:  Vital Signs in the last 24 hours: Temp:  [98.5 F (36.9 C)-98.8 F (37.1 C)] 98.5 F (36.9 C) (06/02 0731) Pulse Rate:  [66-115] 84 (06/02 0800) Resp:  [15-27] 18 (06/02 0800) BP: (112-231)/(59-107) 112/91 (06/02 0800) SpO2:  [93 %-100 %] 99 % (06/02 0923)  Intake/Output from previous day: 06/01 0701 - 06/02 0700 In: 2146.7 [I.V.:2146.7] Out: 250 [Urine:250] Intake/Output from this shift: Total I/O In: -  Out: 350 [Urine:350]  Physical Exam: Neck: no adenopathy, no carotid bruit, no JVD and supple, symmetrical, trachea midline Lungs: clear to auscultation bilaterally Heart: regular rate and rhythm, S1, S2 normal and Soft systolic murmur noted no S3 gallop Abdomen: soft, non-tender; bowel sounds normal; no masses,  no organomegaly Extremities: extremities normal, atraumatic, no cyanosis or edema  Lab Results:  Recent Labs  10/15/16 2154 10/15/16 2214 10/17/16 0256  WBC 10.7*  --  9.0  HGB 14.0 14.6 11.9*  PLT 211  --  178    Recent Labs  10/16/16 0543 10/17/16 0256  NA 142 137  K 4.1 3.6  CL 112* 109  CO2 20* 21*  GLUCOSE 88 72  BUN 17 13  CREATININE 1.58* 1.29*   No results for input(s): TROPONINI in the last 72 hours.  Invalid input(s): CK, MB Hepatic Function Panel  Recent Labs  10/16/16 0543  PROT 6.7  ALBUMIN 3.3*  AST 20  ALT 11*  ALKPHOS 52  BILITOT 0.7    Recent Labs  10/16/16 0543  CHOL 130   No results for input(s): PROTIME in the last 72 hours.  Imaging: Imaging results have been reviewed and Ct Head Wo Contrast  Result Date: 10/15/2016 CLINICAL DATA:  Acute onset of headache and dizziness, after fall. Initial encounter. EXAM: CT HEAD WITHOUT CONTRAST TECHNIQUE: Contiguous axial images were obtained from the base of the skull through  the vertex without intravenous contrast. COMPARISON:  CT of the head performed 10/13/2016 FINDINGS: Brain: There is an evolving acute infarct at the right occipital lobe, with associated mass-effect. There is no evidence of hemorrhagic transformation. No definite mass is identified. Scattered periventricular and subcortical white matter change likely reflects small vessel ischemic microangiopathy. The brainstem and fourth ventricle are within normal limits. The basal ganglia are unremarkable in appearance. No midline shift is seen. Vascular: No hyperdense vessel or unexpected calcification. Skull: There is no evidence of fracture; visualized osseous structures are unremarkable in appearance. Sinuses/Orbits: The orbits are within normal limits. The paranasal sinuses and mastoid air cells are well-aerated. Other: No significant soft tissue abnormalities are seen. IMPRESSION: 1. Evolving acute infarct at the right occipital lobe, with associated mass effect. No evidence of hemorrhagic transformation. 2. Scattered small vessel ischemic microangiopathy. These results were called by telephone at the time of interpretation on 10/15/2016 at 10:59 pm to Dr. Gwyneth Sprout, who verbally acknowledged these results. Electronically Signed   By: Roanna Raider M.D.   On: 10/15/2016 23:01   Mr [name] Head Wo Contrast  Result Date: 10/16/2016 CLINICAL DATA:  Headaches and visual changes. EXAM: MRI HEAD WITHOUT CONTRAST MRA HEAD WITHOUT CONTRAST TECHNIQUE: Multiplanar, multiecho pulse sequences of the brain and surrounding structures were obtained without intravenous contrast. Angiographic images of the head were obtained using MRA technique without contrast. COMPARISON:  1. Head CT 10/15/2016 2. Brain MRI 08/06/2016 FINDINGS: MRI HEAD  FINDINGS Brain: The midline structures are normal. There is diffusion restriction throughout the right posterior cerebral artery distribution. There is a small focus of diffusion restriction in the  left cerebellum measuring approximately 10 mm. The diffusion restriction extends into the lateral aspect of the right thalamus. There is associated hyperintense T2 weighted signal at the above-described locations. There is confluent hyperintense T2-weighted signal within the periventricular white matter, most often seen in the setting of chronic microvascular ischemia. No intraparenchymal hematoma or chronic microhemorrhage. Brain volume is normal for age without age-advanced or lobar predominant atrophy. The dura is normal and there is no extra-axial collection. Skull and upper cervical spine: The visualized skull base, calvarium, upper cervical spine and extracranial soft tissues are normal. Sinuses/Orbits: No fluid levels or advanced mucosal thickening. No mastoid effusion. Normal orbits. MRA HEAD FINDINGS The time-of-flight images are degraded by motion. Intracranial internal carotid arteries: Normal. Anterior cerebral arteries: Normal. Middle cerebral arteries: Multifocal atherosclerotic narrowing on the right. No advanced left MCA stenosis. Posterior communicating arteries: Absent bilaterally. Posterior cerebral arteries: There is loss of the normal flow related enhancement of the right posterior cerebral artery near the P2 P3 junction. Basilar artery: Normal. Vertebral arteries: Left dominant. Normal. Superior cerebellar arteries: Normal. Anterior inferior cerebellar arteries: Not clearly visualized. Posterior inferior cerebellar arteries: Normal. IMPRESSION: 1. Acute ischemia throughout the right posterior cerebral artery distribution without hemorrhage or significant mass effect. 2. Small focus of acute ischemia in the left cerebellum. Bilateral distribution of lesions may indicate a central embolic process. 3. Severe stenosis or occlusion of the right posterior cerebral artery near the junction of the P2 and P3 segments. 4. Otherwise motion-degraded MRA without large vessel occlusion. Electronically Signed    By: Deatra RobinsonKevin  Herman M.D.   On: 10/16/2016 04:03   Mr Brain Wo Contrast  Result Date: 10/16/2016 CLINICAL DATA:  Headaches and visual changes. EXAM: MRI HEAD WITHOUT CONTRAST MRA HEAD WITHOUT CONTRAST TECHNIQUE: Multiplanar, multiecho pulse sequences of the brain and surrounding structures were obtained without intravenous contrast. Angiographic images of the head were obtained using MRA technique without contrast. COMPARISON:  1. Head CT 10/15/2016 2. Brain MRI 08/06/2016 FINDINGS: MRI HEAD FINDINGS Brain: The midline structures are normal. There is diffusion restriction throughout the right posterior cerebral artery distribution. There is a small focus of diffusion restriction in the left cerebellum measuring approximately 10 mm. The diffusion restriction extends into the lateral aspect of the right thalamus. There is associated hyperintense T2 weighted signal at the above-described locations. There is confluent hyperintense T2-weighted signal within the periventricular white matter, most often seen in the setting of chronic microvascular ischemia. No intraparenchymal hematoma or chronic microhemorrhage. Brain volume is normal for age without age-advanced or lobar predominant atrophy. The dura is normal and there is no extra-axial collection. Skull and upper cervical spine: The visualized skull base, calvarium, upper cervical spine and extracranial soft tissues are normal. Sinuses/Orbits: No fluid levels or advanced mucosal thickening. No mastoid effusion. Normal orbits. MRA HEAD FINDINGS The time-of-flight images are degraded by motion. Intracranial internal carotid arteries: Normal. Anterior cerebral arteries: Normal. Middle cerebral arteries: Multifocal atherosclerotic narrowing on the right. No advanced left MCA stenosis. Posterior communicating arteries: Absent bilaterally. Posterior cerebral arteries: There is loss of the normal flow related enhancement of the right posterior cerebral artery near the P2 P3  junction. Basilar artery: Normal. Vertebral arteries: Left dominant. Normal. Superior cerebellar arteries: Normal. Anterior inferior cerebellar arteries: Not clearly visualized. Posterior inferior cerebellar arteries: Normal. IMPRESSION: 1. Acute ischemia throughout  the right posterior cerebral artery distribution without hemorrhage or significant mass effect. 2. Small focus of acute ischemia in the left cerebellum. Bilateral distribution of lesions may indicate a central embolic process. 3. Severe stenosis or occlusion of the right posterior cerebral artery near the junction of the P2 and P3 segments. 4. Otherwise motion-degraded MRA without large vessel occlusion. Electronically Signed   By: Deatra Robinson M.D.   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 Small focus of acute ischemia in the left cerebellum, rule out cardioembolic CVA. Hypertension. Hyperlipidemia. COPD History of bronchial asthma. Tobacco abuse. Chronic kidney disease stage II. Degenerative joint disease. History of spinal stenosis. Plan Continue present management Scheduled for TEE Monday morning Will need loop  recorder if TEE negative  LOS: 1 day    Rinaldo Cloud 10/17/2016, 11:14 AM

## 2016-10-17 NOTE — Progress Notes (Signed)
Pt yelling for help, entered room to find lunch tray on floor, pt was upset because she cant "see the telephone to call her daughter" verbalizes frustration about lost vision and started crying. Pt also complaining of headache, Tylenol had previously been given with no relief. MD paged and new orders received.
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
## 2016-10-18 ENCOUNTER — Inpatient Hospital Stay (HOSPITAL_COMMUNITY): Payer: Medicare Other

## 2016-10-18 DIAGNOSIS — F17200 Nicotine dependence, unspecified, uncomplicated: Secondary | ICD-10-CM

## 2016-10-18 DIAGNOSIS — I639 Cerebral infarction, unspecified: Secondary | ICD-10-CM

## 2016-10-18 DIAGNOSIS — I10 Essential (primary) hypertension: Secondary | ICD-10-CM

## 2016-10-18 DIAGNOSIS — N182 Chronic kidney disease, stage 2 (mild): Secondary | ICD-10-CM

## 2016-10-18 DIAGNOSIS — E785 Hyperlipidemia, unspecified: Secondary | ICD-10-CM

## 2016-10-18 LAB — CBC WITH DIFFERENTIAL/PLATELET
BASOS ABS: 0 10*3/uL (ref 0.0–0.1)
BASOS PCT: 0 %
Eosinophils Absolute: 0.2 10*3/uL (ref 0.0–0.7)
Eosinophils Relative: 3 %
HEMATOCRIT: 40.5 % (ref 36.0–46.0)
HEMOGLOBIN: 12.9 g/dL (ref 12.0–15.0)
Lymphocytes Relative: 23 %
Lymphs Abs: 1.7 10*3/uL (ref 0.7–4.0)
MCH: 30.1 pg (ref 26.0–34.0)
MCHC: 31.9 g/dL (ref 30.0–36.0)
MCV: 94.4 fL (ref 78.0–100.0)
MONO ABS: 0.4 10*3/uL (ref 0.1–1.0)
Monocytes Relative: 6 %
NEUTROS ABS: 4.9 10*3/uL (ref 1.7–7.7)
NEUTROS PCT: 68 %
Platelets: 190 10*3/uL (ref 150–400)
RBC: 4.29 MIL/uL (ref 3.87–5.11)
RDW: 14.6 % (ref 11.5–15.5)
WBC: 7.3 10*3/uL (ref 4.0–10.5)

## 2016-10-18 LAB — BASIC METABOLIC PANEL
ANION GAP: 7 (ref 5–15)
BUN: 8 mg/dL (ref 6–20)
CALCIUM: 10 mg/dL (ref 8.9–10.3)
CO2: 20 mmol/L — ABNORMAL LOW (ref 22–32)
Chloride: 115 mmol/L — ABNORMAL HIGH (ref 101–111)
Creatinine, Ser: 1 mg/dL (ref 0.44–1.00)
GFR calc Af Amer: >60 mL/min (ref >60)
GFR, EST NON AFRICAN AMERICAN: 56 mL/min — AB (ref >60)
GLUCOSE: 98 mg/dL (ref 65–99)
POTASSIUM: 3.7 mmol/L (ref 3.5–5.1)
Sodium: 142 mmol/L (ref 135–145)

## 2016-10-18 MED ORDER — POTASSIUM CHLORIDE CRYS ER 20 MEQ PO TBCR
40.0000 meq | EXTENDED_RELEASE_TABLET | Freq: Once | ORAL | Status: AC
  Administered 2016-10-18: 40 meq via ORAL
  Filled 2016-10-18: qty 2

## 2016-10-18 MED ORDER — MONTELUKAST SODIUM 10 MG PO TABS
10.0000 mg | ORAL_TABLET | Freq: Every day | ORAL | Status: DC
  Administered 2016-10-18 – 2016-10-19 (×2): 10 mg via ORAL
  Filled 2016-10-18 (×2): qty 1

## 2016-10-18 MED ORDER — CLOPIDOGREL BISULFATE 75 MG PO TABS
75.0000 mg | ORAL_TABLET | Freq: Every day | ORAL | Status: DC
  Administered 2016-10-19 – 2016-10-20 (×2): 75 mg via ORAL
  Filled 2016-10-18 (×2): qty 1

## 2016-10-18 NOTE — Progress Notes (Signed)
VASCULAR LAB PRELIMINARY  PRELIMINARY  PRELIMINARY  PRELIMINARY  Bilateral lower extremity venous duplex completed.    Preliminary report:  There is no DVT or SVT noted in the bilateral lower extremities.   Kacia Halley, RVT 10/18/2016, 10:42 AM
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
## 2016-10-18 NOTE — Progress Notes (Signed)
PROGRESS NOTE    [name]  LOV:[id number] DOB: 03-16-1946 DOA: 10/15/2016 PCP: Mackenzie Cloud, MD    Brief Narrative:  [name] is a 71 y.o. female with history of hypertension COPD ongoing tobacco abuse presented to the ER with complaints of visual defects on the left side of the visual field. Patient had come to the ER 3 days ago with headache and at that time CT scan was negative. Patient's daughter noticed that patient has been having increasing difficulty seeing things on the left side with persistent headache and was brought to the ER again yesterday. Patient denied any weakness of the extremities though on exam patient had left-sided weakness. Patient otherwise denied any difficulty walking and talking or swallowing.  CT head done showed an evolving stroke with MRI call from an acute stroke involving the right posterior cerebral artery area as well as left cerebellar area. Patient was seen in consultation by neurology and patient admitted for stroke workup.   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)  #1 acute ischemic stroke/right posterior infarct Likely cardioembolic. No arrhythmias noted on telemetry. Patient admitted to stepdown for closer monitoring. Due to delay in presentation patient deemed not a TPA candidate. CT head on admission with evolving right occipital infarct must scattered small vessel disease. MRI with right PCA infarct. Small left cerebellum infarct. 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  -Carotid Dopplers with no significant ICA stenosis. 2-D echo pending. Patient was initially supposed to have TEE on 10/16/2016 however due to scheduling patient will have TEE on Monday, 10/19/2016  per cardiology.  - LE dopplers negative for DVT or SVT Hemoglobin A1c is 5.4. Fasting lipid panel with LDL of 73.  PT/OT/ST. Continue aspirin for secondary stroke prevention. Continue statin. D/c plavix. Cardiology also consulted for loop recorder if TEE is negative. Cardiology and neurology following.   #2 hypertension Patient has been resumed on home regimen of antihypertensive medications. Follow.  #3 chronic kidney disease stage II Stable.  #4 COPD/ongoing tobacco use Stable. Continue Spiriva, Singulair,dulera, nicotine patch.  #5 chronic back pain Pain management.  #6 anxiety Anxiety improved with ativan. Continue Lexapro.      DVT prophylaxis: Lovenox Code Status: Full Family Communication: Updated patient. No family at bedside. Disposition Plan: Likely CIR versus skilled nursing facility pending stroke workup and PT/ OT /ST evaluation.   Consultants:   Neurology: Dr. Amada Jupiter 10/16/2016  Cardiology: Dr. Sharyn Lull 10/16/2016  Procedures:  CT head 10/15/2016 MRI MRA head 10/16/2016 Carotid Dopplers 10/16/2016 LE dopplers 10/18/2016    Antimicrobials:   None   Subjective: Patient sleeping. Less anxious. Still with left visual field loss. No CP, no SOB.   Objective: Vitals:   10/17/16 2112 10/18/16 0039 10/18/16 0538 10/18/16 1102  BP: 138/81 133/79 126/71 135/86  Pulse: 87 88 77 92  Resp: 18 18 20 18   Temp: 98.5 F (36.9 C) 98.7 F (37.1 C) 98.6 F (37 C) 99.2 F (37.3 C)  TempSrc: Oral Oral Oral Oral  SpO2: 98% 98% 99% 99%  Weight:      Height:        Intake/Output Summary (Last 24 hours) at 10/18/16 1129 Last data filed at 10/17/16 2155  Gross  per 24 hour  Intake                0 ml  Output              250 ml  Net             -250 ml   Filed Weights   10/16/16 0404  Weight: 65.6 kg (144 lb 10 oz)    Examination:  General exam: Sleeping. Respiratory system: Clear to auscultation anterior lung fields. Respiratory effort  normal. Cardiovascular system: S1 & S2 heard, RRR. No JVD, murmurs, rubs, gallops or clicks. No pedal edema. Gastrointestinal system: Abdomen is nondistended, soft and nontender. No organomegaly or masses felt. Normal bowel sounds heard. Central nervous system: Alert and oriented. Patient with left visual field deficit/left hemaniopsia. Some left-sided neglect. 5 /5 bilateral lower extremity strength. 5/5 right upper extremity strength. 4/5 left upper extremity strength.  Extremities: Symmetric 5 x 5 power. Skin: No rashes, lesions or ulcers Psychiatry: Judgement and insight appear fair. Mood & affect appropriate.     Data Reviewed: I have personally reviewed following labs and imaging studies  CBC:  Recent Labs Lab 10/15/16 2154 10/15/16 2214 10/17/16 0256 10/18/16 0825  WBC 10.7*  --  9.0 7.3  NEUTROABS 7.3  --   --  4.9  HGB 14.0 14.6 11.9* 12.9  HCT 42.7 43.0 37.7 40.5  MCV 93.2  --  93.5 94.4  PLT 211  --  178 190   Basic Metabolic Panel:  Recent Labs Lab 10/15/16 2154 10/15/16 2214 10/16/16 0543 10/17/16 0256 10/18/16 0825  NA 141 143 142 137 142  K 3.8 3.8 4.1 3.6 3.7  CL 111 110 112* 109 115*  CO2 20*  --  20* 21* 20*  GLUCOSE 133* 134* 88 72 98  BUN 15 18 17 13 8   CREATININE 1.66* 1.70* 1.58* 1.29* 1.00  CALCIUM 10.6*  --  10.2 9.3 10.0   GFR: Estimated Creatinine Clearance: 49.7 mL/min (by C-G formula based on SCr of 1 mg/dL). Liver Function Tests:  Recent Labs Lab 10/15/16 2154 10/16/16 0543  AST 21 20  ALT 11* 11*  ALKPHOS 58 52  BILITOT 0.6 0.7  PROT 7.4 6.7  ALBUMIN 3.6 3.3*   No results for input(s): LIPASE, AMYLASE in the last 168 hours. No results for input(s): AMMONIA in the last 168 hours. Coagulation Profile:  Recent Labs Lab 10/15/16 2154  INR 1.10   Cardiac Enzymes: No results for input(s): CKTOTAL, CKMB, CKMBINDEX, TROPONINI in the last 168 hours. BNP (last 3 results) No results for input(s): PROBNP in the last 8760  hours. HbA1C:  Recent Labs  10/16/16 0543  HGBA1C 5.4   CBG: No results for input(s): GLUCAP in the last 168 hours. Lipid Profile:  Recent Labs  10/16/16 0543  CHOL 130  HDL 44  LDLCALC 73  TRIG 67  CHOLHDL 3.0   Thyroid Function Tests: No results for input(s): TSH, T4TOTAL, FREET4, T3FREE, THYROIDAB in the last 72 hours. Anemia Panel: No results for input(s): VITAMINB12, FOLATE, FERRITIN, TIBC, IRON, RETICCTPCT in the last 72 hours. Sepsis Labs: No results for input(s): PROCALCITON, LATICACIDVEN in the last 168 hours.  Recent Results (from the past 240 hour(s))  MRSA PCR Screening     Status: Abnormal   Collection Time: 10/16/16  4:02 AM  Result Value Ref Range Status   MRSA by PCR POSITIVE (A) NEGATIVE Final    Comment:        The  GeneXpert MRSA Assay (FDA approved for NASAL specimens only), is one component of a comprehensive MRSA colonization surveillance program. It is not intended to diagnose MRSA infection nor to guide or monitor treatment for MRSA infections. RESULT CALLED TO, READ BACK BY AND VERIFIED WITH: Adin Hector [id number] 06.01.2018 N. MORRIS          Radiology Studies: No results found.      Scheduled Meds: .  stroke: mapping our early stages of recovery book   Does not apply Once  . aspirin  300 mg Rectal Daily   Or  . aspirin  325 mg Oral Daily  . Chlorhexidine Gluconate Cloth  6 each Topical Q0600  . diltiazem  240 mg Oral Daily  . enoxaparin (LOVENOX) injection  40 mg Subcutaneous Q24H  . escitalopram  10 mg Oral Daily  . losartan  100 mg Oral Daily  . mometasone-formoterol  2 puff Inhalation BID  . montelukast  10 mg Oral QHS  . mupirocin ointment  1 application Nasal BID  . nicotine  21 mg Transdermal Daily  . polyethylene glycol  17 g Oral Daily  . rosuvastatin  10 mg Oral Daily  . senna-docusate  1 tablet Oral BID  . thiamine  100 mg Oral Daily  . tiotropium  18 mcg Inhalation Daily  . [START ON 10/19/2016] Vitamin D  (Ergocalciferol)  50,000 Units Oral Weekly   Continuous Infusions:    LOS: 2 days    Time spent: 40 mins    Elman Dettman, MD [facility] Pager [phone]  If 7PM-7AM, please contact night-coverage [email or web address] Password [email or web address] 10/18/2016, 11:29 AM

## 2016-10-18 NOTE — Progress Notes (Addendum)
STROKE TEAM PROGRESS NOTE   SUBJECTIVE (INTERVAL HISTORY) No family at bedside. She still has left hemianopia. Pt has been following with Dr. Everlena Cooper and 24 hour holter monitoring did not show significant arrhythmia. TEE and loop pending.    OBJECTIVE Temp:  [98.3 F (36.8 C)-98.9 F (37.2 C)] 98.6 F (37 C) (06/03 0538) Pulse Rate:  [77-105] 77 (06/03 0538) Cardiac Rhythm: Normal sinus rhythm (06/03 0700) Resp:  [16-20] 20 (06/03 0538) BP: (122-141)/(71-90) 126/71 (06/03 0538) SpO2:  [95 %-100 %] 99 % (06/03 0538)  CBC:   Recent Labs Lab 10/15/16 2154 10/15/16 2214 10/17/16 0256  WBC 10.7*  --  9.0  NEUTROABS 7.3  --   --   HGB 14.0 14.6 11.9*  HCT 42.7 43.0 37.7  MCV 93.2  --  93.5  PLT 211  --  178    Basic Metabolic Panel:   Recent Labs Lab 10/16/16 0543 10/17/16 0256  NA 142 137  K 4.1 3.6  CL 112* 109  CO2 20* 21*  GLUCOSE 88 72  BUN 17 13  CREATININE 1.58* 1.29*  CALCIUM 10.2 9.3   HgbA1c:  Lab Results  Component Value Date   HGBA1C 5.4 10/16/2016   Ct Head Wo Contrast  Result Date: 10/15/2016 CLINICAL DATA:  Acute onset of headache and dizziness, after fall. Initial encounter. EXAM: CT HEAD WITHOUT CONTRAST TECHNIQUE: Contiguous axial images were obtained from the base of the skull through the vertex without intravenous contrast. COMPARISON:  CT of the head performed 10/13/2016 FINDINGS: Brain: There is an evolving acute infarct at the right occipital lobe, with associated mass-effect. There is no evidence of hemorrhagic transformation. No definite mass is identified. Scattered periventricular and subcortical white matter change likely reflects small vessel ischemic microangiopathy. The brainstem and fourth ventricle are within normal limits. The basal ganglia are unremarkable in appearance. No midline shift is seen. Vascular: No hyperdense vessel or unexpected calcification. Skull: There is no evidence of fracture; visualized osseous structures are  unremarkable in appearance. Sinuses/Orbits: The orbits are within normal limits. The paranasal sinuses and mastoid air cells are well-aerated. Other: No significant soft tissue abnormalities are seen. IMPRESSION: 1. Evolving acute infarct at the right occipital lobe, with associated mass effect. No evidence of hemorrhagic transformation. 2. Scattered small vessel ischemic microangiopathy. These results were called by telephone at the time of interpretation on 10/15/2016 at 10:59 pm to Dr. Gwyneth Sprout, who verbally acknowledged these results. Electronically Signed   By: Roanna Raider M.D.   On: 10/15/2016 23:01   Ct Head Wo Contrast  Result Date: 10/13/2016 CLINICAL DATA:  Acute onset severe headache EXAM: CT HEAD WITHOUT CONTRAST TECHNIQUE: Contiguous axial images were obtained from the base of the skull through the vertex without intravenous contrast. COMPARISON:  03/19/2016, 08/06/2016 FINDINGS: Brain: Stable advanced chronic white matter microvascular ischemic changes throughout the cerebral hemispheres. No acute intracranial hemorrhage, mass lesion, definite new infarction, midline shift, herniation, hydrocephalus, or extra-axial fluid collection. Normal gray-white matter differentiation. No focal mass effect or edema. Cisterns are patent. No cerebellar abnormality. Vascular: No hyperdense vessel or unexpected calcification. Skull: Normal. Negative for fracture or focal lesion. Sinuses/Orbits: No acute finding. Other: None. IMPRESSION: Stable advanced chronic white matter microvascular ischemic changes. No interval change or acute process by noncontrast CT. Electronically Signed   By: Judie Petit.  Shick M.D.   On: 10/13/2016 09:20   Mr [name] Head Wo Contrast  Result Date: 10/16/2016 CLINICAL DATA:  Headaches and visual changes. EXAM: MRI HEAD WITHOUT CONTRAST  MRA HEAD WITHOUT CONTRAST TECHNIQUE: Multiplanar, multiecho pulse sequences of the brain and surrounding structures were obtained without intravenous  contrast. Angiographic images of the head were obtained using MRA technique without contrast. COMPARISON:  1. Head CT 10/15/2016 2. Brain MRI 08/06/2016 FINDINGS: MRI HEAD FINDINGS Brain: The midline structures are normal. There is diffusion restriction throughout the right posterior cerebral artery distribution. There is a small focus of diffusion restriction in the left cerebellum measuring approximately 10 mm. The diffusion restriction extends into the lateral aspect of the right thalamus. There is associated hyperintense T2 weighted signal at the above-described locations. There is confluent hyperintense T2-weighted signal within the periventricular white matter, most often seen in the setting of chronic microvascular ischemia. No intraparenchymal hematoma or chronic microhemorrhage. Brain volume is normal for age without age-advanced or lobar predominant atrophy. The dura is normal and there is no extra-axial collection. Skull and upper cervical spine: The visualized skull base, calvarium, upper cervical spine and extracranial soft tissues are normal. Sinuses/Orbits: No fluid levels or advanced mucosal thickening. No mastoid effusion. Normal orbits. MRA HEAD FINDINGS The time-of-flight images are degraded by motion. Intracranial internal carotid arteries: Normal. Anterior cerebral arteries: Normal. Middle cerebral arteries: Multifocal atherosclerotic narrowing on the right. No advanced left MCA stenosis. Posterior communicating arteries: Absent bilaterally. Posterior cerebral arteries: There is loss of the normal flow related enhancement of the right posterior cerebral artery near the P2 P3 junction. Basilar artery: Normal. Vertebral arteries: Left dominant. Normal. Superior cerebellar arteries: Normal. Anterior inferior cerebellar arteries: Not clearly visualized. Posterior inferior cerebellar arteries: Normal. IMPRESSION: 1. Acute ischemia throughout the right posterior cerebral artery distribution without  hemorrhage or significant mass effect. 2. Small focus of acute ischemia in the left cerebellum. Bilateral distribution of lesions may indicate a central embolic process. 3. Severe stenosis or occlusion of the right posterior cerebral artery near the junction of the P2 and P3 segments. 4. Otherwise motion-degraded MRA without large vessel occlusion. Electronically Signed   By: Deatra Robinson M.D.   On: 10/16/2016 04:03   Mr Brain Wo Contrast  Result Date: 10/16/2016 CLINICAL DATA:  Headaches and visual changes. EXAM: MRI HEAD WITHOUT CONTRAST MRA HEAD WITHOUT CONTRAST TECHNIQUE: Multiplanar, multiecho pulse sequences of the brain and surrounding structures were obtained without intravenous contrast. Angiographic images of the head were obtained using MRA technique without contrast. COMPARISON:  1. Head CT 10/15/2016 2. Brain MRI 08/06/2016 FINDINGS: MRI HEAD FINDINGS Brain: The midline structures are normal. There is diffusion restriction throughout the right posterior cerebral artery distribution. There is a small focus of diffusion restriction in the left cerebellum measuring approximately 10 mm. The diffusion restriction extends into the lateral aspect of the right thalamus. There is associated hyperintense T2 weighted signal at the above-described locations. There is confluent hyperintense T2-weighted signal within the periventricular white matter, most often seen in the setting of chronic microvascular ischemia. No intraparenchymal hematoma or chronic microhemorrhage. Brain volume is normal for age without age-advanced or lobar predominant atrophy. The dura is normal and there is no extra-axial collection. Skull and upper cervical spine: The visualized skull base, calvarium, upper cervical spine and extracranial soft tissues are normal. Sinuses/Orbits: No fluid levels or advanced mucosal thickening. No mastoid effusion. Normal orbits. MRA HEAD FINDINGS The time-of-flight images are degraded by motion.  Intracranial internal carotid arteries: Normal. Anterior cerebral arteries: Normal. Middle cerebral arteries: Multifocal atherosclerotic narrowing on the right. No advanced left MCA stenosis. Posterior communicating arteries: Absent bilaterally. Posterior cerebral arteries: There is loss of  the normal flow related enhancement of the right posterior cerebral artery near the P2 P3 junction. Basilar artery: Normal. Vertebral arteries: Left dominant. Normal. Superior cerebellar arteries: Normal. Anterior inferior cerebellar arteries: Not clearly visualized. Posterior inferior cerebellar arteries: Normal. IMPRESSION: 1. Acute ischemia throughout the right posterior cerebral artery distribution without hemorrhage or significant mass effect. 2. Small focus of acute ischemia in the left cerebellum. Bilateral distribution of lesions may indicate a central embolic process. 3. Severe stenosis or occlusion of the right posterior cerebral artery near the junction of the P2 and P3 segments. 4. Otherwise motion-degraded MRA without large vessel occlusion. Electronically Signed   By: Deatra RobinsonKevin  Herman M.D.   On: 10/16/2016 04:03   CUS - Findings are consistent with a 1-39 percent stenosis involving the right internal carotid artery and the left internal carotid artery. The vertebral arteries demonstrate antegrade flow.  Echo - - Left ventricle: The cavity size was normal. Systolic function was   normal. The estimated ejection fraction was in the range of 50%   to 55%. Wall motion was normal; there were no regional wall   motion abnormalities. Doppler parameters are consistent with   abnormal left ventricular relaxation (grade 1 diastolic   dysfunction). - Atrial septum: Echo contrast study showed no right-to-left atrial   level shunt, at baseline or with provocation.   PHYSICAL EXAM Pleasant middle-age lady not in distress. . Afebrile. Head is nontraumatic. Neck is supple without bruit.    Cardiac exam no murmur or  gallop. Lungs are clear to auscultation. Distal pulses are well felt. Neurological Exam ;  Awake  Alert oriented x 3. Normal speech and language. eye movements full without nystagmus. fundi were not visualized. Vision acuity appears normal. Dense left homonymous hemianopsia Hearing is normal. Palatal movements are normal. Face symmetric. Tongue midline. Normal strength, tone, reflexes and coordination. Normal sensation. Gait deferred.  ASSESSMENT/PLAN [name] is a 71 y.o. female with history of HA, syncope and disorientation after fall. [facility] neg and d/c home. [facility] with HA and L facial droop. CT shows a R occipital infarct. She did not receive IV t-PA due to being out of time window.   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 Scattered Small vessel disease.   MRI  R PCA infarct. 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 No cardiac source of emboli identified.  24 hour holter monitor in 08/2016 - no arrhythmia  TEE and loop - pending  LDL 73  HgbA1c - 5.4  UDS - pending  Lovenox 30 mg sq daily for VTE prophylaxis Diet Heart Room service appropriate? Yes; Fluid consistency: Thin Diet NPO time specified  No antithrombotic prior to admission, now on aspirin 325 mg daily. Recommend DAPT with ASA and plavix for 3 months and then plavix alone due to intracranial stenosis.  Therapy recommendations:  SNF   Disposition:  pending   Educated pt on not driving due to left hemianopia. She needs to follow up with eye doctor for visual field monitoring.  Heart palpitation  Had 24h holter monitor in 08/2016 - no arrhythmia  Pending TEE and loop  If afib confirmed, need  to consider  anticoagulatoin  Hypertension  Stable, highest BP 141/84 Permissive hypertension (OK if < 220/120) but gradually normalize in 5-7 days Long-term BP goal normotensive  Hyperlipidemia  Home meds:  crestor 10, resumed in hospital  LDL 73 , not at goal  Continue statin at discharge  Tobacco abuse  Current smoker  Smoking cessation counseling provided  Nicotine patch provided  Pt is willing to quit  Other Stroke Risk Factors  Advanced age  migarine  Other Active Problems  COPD  MCV 10/2015 follows with Dr. Everlena Cooper  Mild cognitive impairment - underwent cognitive testing - follows with Dr. Moshe Salisbury day # 2  Marvel Plan, MD PhD Stroke Neurology 10/18/2016 10:39 PM     To contact Stroke Continuity provider, please refer to [email or web address]. After hours, contact General Neurology

## 2016-10-18 NOTE — Progress Notes (Signed)
Subjective:  Patient denies any chest pain or shortness of breath. Denies palpitations. States headache is improved. Left eye vision loss persist.  Objective:  Vital Signs in the last 24 hours: Temp:  [98.3 F (36.8 C)-98.9 F (37.2 C)] 98.6 F (37 C) (06/03 0538) Pulse Rate:  [77-105] 77 (06/03 0538) Resp:  [16-20] 20 (06/03 0538) BP: (122-141)/(71-90) 126/71 (06/03 0538) SpO2:  [95 %-100 %] 99 % (06/03 0538)  Intake/Output from previous day: 06/02 0701 - 06/03 0700 In: -  Out: 600 [Urine:600] Intake/Output from this shift: No intake/output data recorded.  Physical Exam: Exam unchanged  Lab Results:  Recent Labs  10/17/16 0256 10/18/16 0825  WBC 9.0 7.3  HGB 11.9* 12.9  PLT 178 190    Recent Labs  10/17/16 0256 10/18/16 0825  NA 137 142  K 3.6 3.7  CL 109 115*  CO2 21* 20*  GLUCOSE 72 98  BUN 13 8  CREATININE 1.29* 1.00   No results for input(s): TROPONINI in the last 72 hours.  Invalid input(s): CK, MB Hepatic Function Panel  Recent Labs  10/16/16 0543  PROT 6.7  ALBUMIN 3.3*  AST 20  ALT 11*  ALKPHOS 52  BILITOT 0.7    Recent Labs  10/16/16 0543  CHOL 130   No results for input(s): PROTIME in the last 72 hours.  Imaging: Imaging results have been reviewed and No results found.  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 Small focus of acute ischemia in the left cerebellum, rule out cardioembolic CVA. Hypertension. Hyperlipidemia. COPD History of bronchial asthma. Tobacco abuse. Chronic kidney disease stage II. Degenerative joint disease. History of spinal stenosis. Plan Continue present management Scheduled for TEE in a.m. Will need EP consult for loop recorder if TEE negative  LOS: 2 days    [name], [name] 10/18/2016, 10:39 AM

## 2016-10-19 ENCOUNTER — Ambulatory Visit (HOSPITAL_COMMUNITY): Admit: 2016-10-19 | Payer: Medicare Other | Admitting: Internal Medicine

## 2016-10-19 ENCOUNTER — Encounter (HOSPITAL_COMMUNITY): Payer: Self-pay | Admitting: Internal Medicine

## 2016-10-19 ENCOUNTER — Inpatient Hospital Stay (HOSPITAL_COMMUNITY): Payer: Medicare Other

## 2016-10-19 ENCOUNTER — Encounter (HOSPITAL_COMMUNITY)
Admission: EM | Disposition: A | Discharge status: Skilled Nursing Facility | Payer: Self-pay | Source: Home / Self Care | Attending: Internal Medicine

## 2016-10-19 DIAGNOSIS — J449 Chronic obstructive pulmonary disease, unspecified: Secondary | ICD-10-CM

## 2016-10-19 DIAGNOSIS — I63531 Cerebral infarction due to unspecified occlusion or stenosis of right posterior cerebral artery: Secondary | ICD-10-CM

## 2016-10-19 DIAGNOSIS — I639 Cerebral infarction, unspecified: Secondary | ICD-10-CM

## 2016-10-19 HISTORY — PX: TEE WITHOUT CARDIOVERSION: SHX5443

## 2016-10-19 HISTORY — PX: LOOP RECORDER INSERTION: EP1214

## 2016-10-19 LAB — BASIC METABOLIC PANEL
Anion gap: 9 (ref 5–15)
BUN: 7 mg/dL (ref 6–20)
CHLORIDE: 112 mmol/L — AB (ref 101–111)
CO2: 21 mmol/L — ABNORMAL LOW (ref 22–32)
CREATININE: 1.04 mg/dL — AB (ref 0.44–1.00)
Calcium: 9.9 mg/dL (ref 8.9–10.3)
GFR calc Af Amer: >60 mL/min (ref >60)
GFR, EST NON AFRICAN AMERICAN: 54 mL/min — AB (ref >60)
Glucose, Bld: 95 mg/dL (ref 65–99)
POTASSIUM: 3.8 mmol/L (ref 3.5–5.1)
SODIUM: 142 mmol/L (ref 135–145)

## 2016-10-19 LAB — CBC WITH DIFFERENTIAL/PLATELET
Basophils Absolute: 0 10*3/uL (ref 0.0–0.1)
Basophils Relative: 0 %
EOS ABS: 0.3 10*3/uL (ref 0.0–0.7)
EOS PCT: 4 %
HCT: 38.8 % (ref 36.0–46.0)
Hemoglobin: 12.5 g/dL (ref 12.0–15.0)
LYMPHS ABS: 1.8 10*3/uL (ref 0.7–4.0)
Lymphocytes Relative: 25 %
MCH: 29.9 pg (ref 26.0–34.0)
MCHC: 32.2 g/dL (ref 30.0–36.0)
MCV: 92.8 fL (ref 78.0–100.0)
MONOS PCT: 8 %
Monocytes Absolute: 0.6 10*3/uL (ref 0.1–1.0)
Neutro Abs: 4.6 10*3/uL (ref 1.7–7.7)
Neutrophils Relative %: 63 %
PLATELETS: 167 10*3/uL (ref 150–400)
RBC: 4.18 MIL/uL (ref 3.87–5.11)
RDW: 14.5 % (ref 11.5–15.5)
WBC: 7.2 10*3/uL (ref 4.0–10.5)

## 2016-10-19 SURGERY — LOOP RECORDER INSERTION

## 2016-10-19 SURGERY — ECHOCARDIOGRAM, TRANSESOPHAGEAL
Anesthesia: Moderate Sedation

## 2016-10-19 MED ORDER — DIPHENHYDRAMINE HCL 50 MG/ML IJ SOLN
INTRAMUSCULAR | Status: DC | PRN
  Administered 2016-10-19: 25 mg via INTRAVENOUS

## 2016-10-19 MED ORDER — MIDAZOLAM HCL 10 MG/2ML IJ SOLN
INTRAMUSCULAR | Status: DC | PRN
  Administered 2016-10-19 (×2): 1 mg via INTRAVENOUS
  Administered 2016-10-19 (×4): 2 mg via INTRAVENOUS

## 2016-10-19 MED ORDER — FENTANYL CITRATE (PF) 100 MCG/2ML IJ SOLN
INTRAMUSCULAR | Status: DC | PRN
  Administered 2016-10-19 (×4): 25 ug via INTRAVENOUS

## 2016-10-19 MED ORDER — BUTAMBEN-TETRACAINE-BENZOCAINE 2-2-14 % EX AERO
INHALATION_SPRAY | CUTANEOUS | Status: DC | PRN
  Administered 2016-10-19: 2 via TOPICAL

## 2016-10-19 MED ORDER — LIDOCAINE-EPINEPHRINE 1 %-1:100000 IJ SOLN
INTRAMUSCULAR | Status: AC
  Filled 2016-10-19: qty 1

## 2016-10-19 MED ORDER — MIDAZOLAM HCL 5 MG/ML IJ SOLN
INTRAMUSCULAR | Status: AC
  Filled 2016-10-19: qty 2

## 2016-10-19 MED ORDER — FENTANYL CITRATE (PF) 100 MCG/2ML IJ SOLN
INTRAMUSCULAR | Status: AC
  Filled 2016-10-19: qty 2

## 2016-10-19 MED ORDER — DIPHENHYDRAMINE HCL 50 MG/ML IJ SOLN
INTRAMUSCULAR | Status: AC
  Filled 2016-10-19: qty 1

## 2016-10-19 MED ORDER — LIDOCAINE-EPINEPHRINE 1 %-1:100000 IJ SOLN
INTRAMUSCULAR | Status: DC | PRN
  Administered 2016-10-19: 10 mL

## 2016-10-19 SURGICAL SUPPLY — 2 items
LOOP REVEAL LINQSYS (Prosthesis & Implant Heart) ×1 IMPLANT
PACK LOOP INSERTION (CUSTOM PROCEDURE TRAY) ×2 IMPLANT

## 2016-10-19 NOTE — Progress Notes (Signed)
  Echocardiogram Echocardiogram Transesophageal has been performed.  [name], Mackenzie Warwick 10/19/2016, 9:39 AM

## 2016-10-19 NOTE — Progress Notes (Signed)
STROKE TEAM PROGRESS NOTE   SUBJECTIVE (INTERVAL HISTORY) Daughter is at bedside. She still has left hemianopia. TEE unremarkable and loop placed.    OBJECTIVE Temp:  [98.3 F (36.8 C)-99.1 F (37.3 C)] 98.7 F (37.1 C) (06/04 1340) Pulse Rate:  [81-116] 115 (06/04 1340) Cardiac Rhythm: Normal sinus rhythm (06/04 0700) Resp:  [11-30] 20 (06/04 1340) BP: (125-200)/(74-155) 141/90 (06/04 1340) SpO2:  [95 %-100 %] 97 % (06/04 1340) Weight:  [136 lb (61.7 kg)] 136 lb (61.7 kg) (06/04 0806)  CBC:   Recent Labs Lab 10/18/16 0825 10/19/16 0547  WBC 7.3 7.2  NEUTROABS 4.9 4.6  HGB 12.9 12.5  HCT 40.5 38.8  MCV 94.4 92.8  PLT 190 167    Basic Metabolic Panel:   Recent Labs Lab 10/18/16 0825 10/19/16 0547  NA 142 142  K 3.7 3.8  CL 115* 112*  CO2 20* 21*  GLUCOSE 98 95  BUN 8 7  CREATININE 1.00 1.04*  CALCIUM 10.0 9.9   HgbA1c:  Lab Results  Component Value Date   HGBA1C 5.4 10/16/2016  I have personally reviewed the radiological images below and agree with the radiology interpretations.  Ct Head Wo Contrast 10/15/2016 IMPRESSION: 1. Evolving acute infarct at the right occipital lobe, with associated mass effect. No evidence of hemorrhagic transformation. 2. Scattered small vessel ischemic microangiopathy.  Ct Head Wo Contrast 10/13/2016 IMPRESSION: Stable advanced chronic white matter microvascular ischemic changes. No interval change or acute process by noncontrast CT.   Mri and Mra Head Wo Contrast 10/16/2016 IMPRESSION: 1. Acute ischemia throughout the right posterior cerebral artery distribution without hemorrhage or significant mass effect. 2. Small focus of acute ischemia in the left cerebellum. Bilateral distribution of lesions may indicate a central embolic process. 3. Severe stenosis or occlusion of the right posterior cerebral artery near the junction of the P2 and P3 segments. 4. Otherwise motion-degraded MRA without large vessel occlusion.   CUS -  Findings are consistent with a 1-39 percent stenosis involving the right internal carotid artery and the left internal carotid artery. The vertebral arteries demonstrate antegrade flow.  Echo - - Left ventricle: The cavity size was normal. Systolic function was   normal. The estimated ejection fraction was in the range of 50%   to 55%. Wall motion was normal; there were no regional wall   motion abnormalities. Doppler parameters are consistent with   abnormal left ventricular relaxation (grade 1 diastolic   dysfunction). - Atrial septum: Echo contrast study showed no right-to-left atrial   level shunt, at baseline or with provocation.   PHYSICAL EXAM Pleasant middle-age lady not in distress. . Afebrile. Head is nontraumatic. Neck is supple without bruit.    Cardiac exam no murmur or gallop. Lungs are clear to auscultation. Distal pulses are well felt. Neurological Exam ;  Awake  Alert oriented x 3. Normal speech and language. eye movements full without nystagmus. fundi were not visualized. Vision acuity appears normal. Dense left homonymous hemianopsia Hearing is normal. Palatal movements are normal. Face symmetric. Tongue midline. Normal strength, tone, reflexes and coordination. Normal sensation. Gait deferred.  ASSESSMENT/PLAN [name] is a 71 y.o. female with history of HA, syncope and disorientation after fall. [facility] neg and d/c home. [facility] and L facial droop. CT shows a R occipital infarct. She did not receive IV t-PA due to being out of time window.   Stroke:  right PCA and L cerebellar  infarcts,  embolic secondary to unknown source  Resultant  left hemianopsia  CT evolving R occipital infarct. Scattered Small vessel disease.   MRI  R PCA infarct. small L cerebellum infarct  MRA  Severe R PCA P2-P3 stenosis / occlusion  Carotid Doppler unremarkable  LE venous duplex - 10/18/2016 - negative for DVT  2D Echo  Bubble study 09/22/2016 -  EF 50-55%. No cardiac source of emboli identified.  24 hour holter monitor in 08/2016 - no arrhythmia  TEE unremarkable  Loop recorder placed  LDL 73  HgbA1c - 5.4  UDS - pending  Lovenox 30 mg sq daily for VTE prophylaxis Diet Heart Room service appropriate? Yes; Fluid consistency: Thin  No antithrombotic prior to admission, now on aspirin 325 mg daily. Recommend DAPT with ASA and plavix for 3 months and then plavix alone due to intracranial stenosis.  Therapy recommendations:  SNF   Disposition:  pending   Educated pt on not driving due to left hemianopia. She needs to follow up with eye doctor for visual field monitoring.  Heart palpitation  Had 24h holter monitor in 08/2016 - no arrhythmia  TEE unremarkable  Loop placed  If afib confirmed, need to consider anticoagulatoin  Hypertension  Stable, highest BP 141/84 Permissive hypertension (OK if < 220/120) but gradually normalize in 5-7 days Long-term BP goal normotensive  Hyperlipidemia  Home meds:  crestor 10, resumed in hospital  LDL 73 , not at goal  Continue statin at discharge  Tobacco abuse  Current smoker  Smoking cessation counseling provided  Nicotine patch provided  Pt is willing to quit  Other Stroke Risk Factors  Advanced age  migarine  Other Active Problems  COPD  MCV 10/2015 follows with Dr. Everlena Cooper  Mild cognitive impairment - underwent cognitive testing - follows with Dr. [facility] day # 3  Neurology will sign off. Please call with questions. Pt will follow up with Dr. Everlena Cooper at [facility] in about 6 weeks. Thanks for the consult.   Marvel Plan, MD PhD Stroke Neurology 10/19/2016 4:18 PM     To contact Stroke Continuity provider, please refer to [email or web address]. After hours, contact General Neurology

## 2016-10-19 NOTE — Progress Notes (Signed)
Rehab admissions - Please see rehab consult done by Dr. Riley KillSwartz on 10/17/16 recommending SNF placement for continued therapies.  Call me for questions.  #[phone]

## 2016-10-19 NOTE — Clinical Social Work Note (Signed)
Clinical Social Work Assessment  Patient Details  Name: [name] A Kitts MRN: [id number] Date of Birth: 1946-05-01  Date of referral:  10/19/16               Reason for consult:  Facility Placement, Discharge Planning                Permission sought to share information with:  Facility Medical sales representativeContact Representative, Family Supports Permission granted to share information::  Yes, Verbal Permission Granted  Name::     Coralie KeensLisa, [name]  Agency::  SNF  Relationship::  Daughters  Contact Information:     Housing/Transportation Living arrangements for the past 2 months:  Single Family Home Source of Information:  Patient Patient Interpreter Needed:  None Criminal Activity/Legal Involvement Pertinent to Current Situation/Hospitalization:  No - Comment as needed Significant Relationships:  Adult Children Lives with:  Self Do you feel safe going back to the place where you live?  Yes Need for family participation in patient care:  Yes (Comment) (pt oriented, but has some difficulty with memory and making decisions, wants daughters involved)  Care giving concerns:  Pt currently lives alone with no support, and has increased concerns with caring for herself after the stroke.   Social Worker assessment / plan:  CSW explained recommendation for SNF placement and discussed with pt. Pt explained that she had a friend who had been to SNF, but could not remember the name. Pt expressed concern about being able to care for herself with her mobility limitations and her current deficits after the stroke. CSW explained referral process and CSW role in discharge planning process.  Employment status:  Retired Health and safety inspectornsurance information:  Medicare PT Recommendations:  Skilled Nursing Facility Information / Referral to community resources:  Skilled Nursing Facility  Patient/Family's Response to care:  Pt agreeable to SNF placement.  Patient/Family's Understanding of and Emotional Response to Diagnosis, Current Treatment,  and Prognosis:  Pt seemed overwhelmed with her current limitations, and nervous about how she would be able to care for her needs upon return home. Pt acknowledged understanding of CSW role in discharge planning and appreciated the help.  Emotional Assessment Appearance:  Appears stated age Attitude/Demeanor/Rapport:    Affect (typically observed):  Appropriate Orientation:  Oriented to Self, Oriented to Place, Oriented to  Time, Oriented to Situation Alcohol / Substance use:  Not Applicable Psych involvement (Current and /or in the community):  No (Comment)  Discharge Needs  Concerns to be addressed:  Care Coordination, Discharge Planning Concerns Readmission within the last 30 days:  No Current discharge risk:  Physical Impairment, Lives alone Barriers to Discharge:  Continued Medical Work up   Dollar GeneralElizabeth M Shadaya Marschner, LCSW 10/19/2016, 4:32 PM

## 2016-10-19 NOTE — Progress Notes (Signed)
Occupational Therapy Treatment Patient Details Name: [name] MRN: [id number] DOB: 09-16-1945 Today's Date: 10/19/2016    History of present illness Patient is a 71 y/o female who presents with visual deficits, HA and left sided weakness. Head CT- Evolving acute infarct at the right occipital lobe. MRI- infarct in right cerebral posterior artery and left cerebellum. PMH includes HTN, COPD, tobacco use, anxiety.   OT comments  Pt demonstrating progress toward OT goals. She demonstrates improved awareness of deficits this session and continues to require VC's to incorporate compensatory strategies. Facilitated improved ability to utilize functional vision of L visual field with anchoring techniques; however, pt with little recall of these at end of session. She was able to incorporate head turns during session. She was able to identify food items on her tray to the L with instructional cues. Additionally, she completed stand-pivot toilet transfer with min handheld assist this session. OT will continue to follow while admitted. Continue to recommend short-term SNF for continued rehabilitation post-acute D/C.   Follow Up Recommendations  Supervision/Assistance - 24 hour;SNF (CIR deferred to SNF)    Equipment Recommendations  Other (comment) (TBD at next venue of care)    Recommendations for Other Services      Precautions / Restrictions Precautions Precautions: Fall Precaution Comments: left visual field cut; inattention to L side of body and environment.  Restrictions Weight Bearing Restrictions: No       Mobility Bed Mobility Overal bed mobility: Needs Assistance Bed Mobility: Supine to Sit     Supine to sit: Min guard     General bed mobility comments: Increased time and VC's for problem solving. Pt attempting to scoot up in bed rather than bring legs off of bed.   Transfers Overall transfer level: Needs assistance Equipment used: 1 person hand held assist Transfers:  Sit to/from [employer] Sit to Stand: Min assist Stand pivot transfers: Min assist       General transfer comment: Pt requiring assistance for balance during stand-pivot transfers.     Balance Overall balance assessment: Needs assistance Sitting-balance support: No upper extremity supported;Feet supported Sitting balance-Leahy Scale: Good Sitting balance - Comments: pt able to don rob while seated edge of bed   Standing balance support: Single extremity supported;During functional activity Standing balance-Leahy Scale: Poor Standing balance comment: Requires UE support for balance.                           ADL either performed or assessed with clinical judgement   ADL Overall ADL's : Needs assistance/impaired Eating/Feeding: Supervision/ safety;Sitting Eating/Feeding Details (indicate cue type and reason): Maximal instructional cues to identify items on L side of tray. Pt had roll in her hand on my arrival, but asked "where is my roll?"                     Toilet Transfer: Minimal assistance;Stand-pivot;BSC Toilet Transfer Details (indicate cue type and reason): Simulated from bed to chair. VC's for safe hand placement and for attention to the L.            General ADL Comments: Pt with improving awareness of visual deficits but continues to require VC's to attend to L visual field. After pt discovers an object to her L she will report "it is just so difficult because I can't see to the L" and told a family member that she is unable to see to the L.  Vision   Vision Assessment?: Vision impaired- to be further tested in functional context Additional Comments: L visual field deficit remains. Pt with improving awareness of this but continues to require auditory cues to attend to L side of tray. Initiated education concerning anchoring techniques but minimal recall of this education at end of session.    Perception     Praxis       Cognition Arousal/Alertness: Awake/alert Behavior During Therapy: WFL for tasks assessed/performed Overall Cognitive Status: Within Functional Limits for tasks assessed Area of Impairment: Attention;Safety/judgement;Awareness;Problem solving                   Current Attention Level: Sustained     Safety/Judgement: Decreased awareness of deficits;Decreased awareness of safety Awareness: Emergent Problem Solving: Slow processing;Difficulty sequencing;Decreased initiation;Requires verbal cues;Requires tactile cues General Comments: Pt with difficulty recalling anchoring strategies for compensation for L visual field deficit. Difficulty problem solving how to eat her food and complete simulated toilet transfers.         Exercises     Shoulder Instructions       General Comments Pt's daughter and son in law present during session    Pertinent Vitals/ Pain       Pain Assessment: No/denies pain  Home Living                                          Prior Functioning/Environment              Frequency  Min 3X/week        Progress Toward Goals  OT Goals(current goals can now be found in the care plan section)  Progress towards OT goals: Progressing toward goals  Acute Rehab OT Goals Patient Stated Goal: to get back to walk and see OT Goal Formulation: With patient Time For Goal Achievement: 11/01/16 Potential to Achieve Goals: Good ADL Goals Pt Will Perform Eating: with set-up;sitting Pt Will Transfer to Toilet: with modified independence;stand pivot transfer;bedside commode Pt Will Perform Toileting - Clothing Manipulation and hygiene: with modified independence;sit to/from stand Additional ADL Goal #1: Pt will find 2/3 grooming tools on the left side with less than 2 auditory cues  Plan Discharge plan remains appropriate    Co-evaluation                 AM-PAC PT "6 Clicks" Daily Activity     Outcome Measure   Help from  another person eating meals?: A Little Help from another person taking care of personal grooming?: A Little Help from another person toileting, which includes using toliet, bedpan, or urinal?: A Little Help from another person bathing (including washing, rinsing, drying)?: A Lot Help from another person to put on and taking off regular upper body clothing?: A Lot Help from another person to put on and taking off regular lower body clothing?: A Lot 6 Click Score: 15    End of Session    OT Visit Diagnosis: Unsteadiness on feet (R26.81);Other abnormalities of gait and mobility (R26.89);Other symptoms and signs involving the nervous system (R29.898);Hemiplegia and hemiparesis Hemiplegia - Right/Left: Left Hemiplegia - dominant/non-dominant: Non-Dominant Hemiplegia - caused by: Cerebral infarction   Activity Tolerance Patient tolerated treatment well   Patient Left in chair;with call bell/phone within reach;with chair alarm set   Nurse Communication Mobility status;Other (comment) (room set-up for compensation for L visual field deficit. )  Time: 1206-1225 OT Time Calculation (min): 19 min  Charges: OT General Charges $OT Visit: 1 Procedure OT Treatments $Self Care/Home Management : 8-22 mins  Doristine Section, MS OTR/L  Pager: [phone]    Maliya Marich A Rumaisa Schnetzer 10/19/2016, 4:59 PM

## 2016-10-19 NOTE — Interval H&P Note (Signed)
History and Physical Interval Note:  10/19/2016 8:28 AM  [name]  has presented today for surgery, with the diagnosis of stroke  The various methods of treatment have been discussed with the patient and family. After consideration of risks, benefits and other options for treatment, the patient has consented to  Procedure(s): TRANSESOPHAGEAL ECHOCARDIOGRAM (TEE) (N/A) as a surgical intervention .  The patient's history has been reviewed, patient examined, no change in status, stable for surgery.  I have reviewed the patient's chart and labs.  Questions were answered to the patient's satisfaction.     [name]

## 2016-10-19 NOTE — Progress Notes (Signed)
PROGRESS NOTE    [name]  UJW:[id number] DOB: 1946-02-05 DOA: 10/15/2016 PCP: Rinaldo CloudHarwani, Mohan, MD    Brief Narrative:  [name] is a 71 y.o. female with history of hypertension COPD ongoing tobacco abuse presented to the ER with complaints of visual defects on the left side of the visual field. Patient had come to the ER 3 days ago with headache and at that time CT scan was negative. Patient's daughter noticed that patient has been having increasing difficulty seeing things on the left side with persistent headache and was brought to the ER again yesterday. Patient denied any weakness of the extremities though on exam patient had left-sided weakness. Patient otherwise denied any difficulty walking and talking or swallowing.  CT head done showed an evolving stroke with MRI call from an acute stroke involving the right posterior cerebral artery area as well as left cerebellar area. Patient was seen in consultation by neurology and patient admitted for stroke workup.   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 No arrhythmias noted on telemetry. Patient admitted to stepdown for closer monitoring. Due to delay in presentation patient deemed not a TPA candidate. CT head on admission with evolving right occipital infarct must scattered small vessel disease. MRI with right PCA infarct. Small left cerebellum infarct. 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  -Carotid Dopplers with no significant ICA stenosis. 2-D echo pending. Patient was initially supposed to have TEE on 10/16/2016 however due to scheduling patient had TEE today Monday, 10/19/2016 per  cardiology which was negative for any vegetations..  - LE dopplers negative for DVT or SVT - Patient also underwent loop recorder placement per electrophysiology. Hemoglobin A1c is 5.4. Fasting lipid panel with LDL of 73.  PT/OT/ST. Neurology recommending dual antiplatelet therapy with aspirin and Plavix for 3 months and then Plavix alone due to patient's intracranial stenosis for secondary stroke prevention. Continue statin. Cardiology and neurology following.  Patient educated on not driving secondary to left hemianopsia.  #2 hypertension Patient has been resumed on home regimen of antihypertensive medications. Follow.  #3 chronic kidney disease stage II Stable.  #4 COPD/ongoing tobacco use Stable. Continue Spiriva, Singulair,dulera, nicotine patch.  #5 chronic back pain Pain management.  #6 anxiety Anxiety improved with ativan. Continue Lexapro.  #7 heart palpitations Patient had a 24-hour Holter monitor April 2018 with no arrhythmia noted. TEE was unremarkable. Loop has been placed. Per neurology if atrial fibrillation is confirmed on loop recorder patient will need to be placed on anticoagulation.  #8 hyperlipidemia LDL of 73. Continue statin.  #9 tobacco abuse Tobacco cessation. Nicotine patch.      DVT prophylaxis: Lovenox Code Status: Full Family Communication: Updated patient and daughter at bedside. Disposition Plan: skilled nursing facility pending stroke workup and PT/ OT /ST evaluation.   Consultants:   Neurology: Dr. Amada JupiterKirkpatrick 10/16/2016  Cardiology: Dr. Sharyn LullHarwani 10/16/2016  Cardiology/EP: Dr. Graciela HusbandsKlein 10/19/2016  Procedures:  CT head 10/15/2016 MRI MRA head 10/16/2016 Carotid Dopplers 10/16/2016 LE dopplers 10/18/2016 TEE per Dr.Kadakia 10/19/2016 Loop recorder placement    Antimicrobials:   None   Subjective: Patient complaint embedded just returned from TEE. No chest pain. No  shortness of breath. Left visual field cut.    Objective: Vitals:   10/19/16 1030 10/19/16 1032 10/19/16 1052 10/19/16 1340  BP: 135/86 135/86 128/75 (!) 141/90  Pulse: (!) 102 (!) 101 (!) 103 (!) 115  Resp: (!) 23 (!) 23 20 20   Temp:   98.8 F (37.1 C) 98.7 F (37.1 C)  TempSrc:   Oral Oral  SpO2: 97% 97% 99% 97%  Weight:      Height:        Intake/Output Summary (Last 24 hours) at 10/19/16 1343 Last data filed at 10/19/16 1340  Gross per 24 hour  Intake              240 ml  Output                0 ml  Net              240 ml   Filed Weights   10/16/16 0404 10/19/16 0806  Weight: 65.6 kg (144 lb 10 oz) 61.7 kg (136 lb)    Examination:  General exam: NAD Respiratory system: Clear to auscultation anterior lung fields. Respiratory effort normal. Cardiovascular system: S1 & S2 heard, RRR. No JVD, murmurs, rubs, gallops or clicks. No pedal edema. Gastrointestinal system: Abdomen is nondistended, soft and nontender. No organomegaly or masses felt. Normal bowel sounds heard. Central nervous system: Alert and oriented. Patient with left visual field deficit/left hemaniopsia. Some left-sided neglect. 5 /5 bilateral lower extremity strength. 5/5 right upper extremity strength. 4/5 left upper extremity strength.  Extremities: Symmetric 5 x 5 power. Skin: No rashes, lesions or ulcers Psychiatry: Judgement and insight appear fair. Mood & affect appropriate.     Data Reviewed: I have personally reviewed following labs and imaging studies  CBC:  Recent Labs Lab 10/15/16 2154 10/15/16 2214 10/17/16 0256 10/18/16 0825 10/19/16 0547  WBC 10.7*  --  9.0 7.3 7.2  NEUTROABS 7.3  --   --  4.9 4.6  HGB 14.0 14.6 11.9* 12.9 12.5  HCT 42.7 43.0 37.7 40.5 38.8  MCV 93.2  --  93.5 94.4 92.8  PLT 211  --  178 190 167   Basic Metabolic Panel:  Recent Labs Lab 10/15/16 2154 10/15/16 2214 10/16/16 0543 10/17/16 0256 10/18/16 0825 10/19/16 0547  NA 141 143 142 137 142 142  K 3.8 3.8 4.1 3.6 3.7 3.8  CL 111 110 112* 109  115* 112*  CO2 20*  --  20* 21* 20* 21*  GLUCOSE 133* 134* 88 72 98 95  BUN 15 18 17 13 8 7   CREATININE 1.66* 1.70* 1.58* 1.29* 1.00 1.04*  CALCIUM 10.6*  --  10.2 9.3 10.0 9.9   GFR: Estimated Creatinine Clearance: 47.8 mL/min (A) (by C-G formula based on SCr of 1.04 mg/dL (H)). Liver Function Tests:  Recent Labs Lab 10/15/16 2154 10/16/16 0543  AST 21 20  ALT 11* 11*  ALKPHOS 58 52  BILITOT 0.6 0.7  PROT 7.4 6.7  ALBUMIN 3.6 3.3*   No results for input(s): LIPASE, AMYLASE in the last 168 hours. No results for input(s): AMMONIA in the last 168 hours. Coagulation Profile:  Recent Labs Lab 10/15/16 2154  INR 1.10   Cardiac Enzymes: No results for input(s): CKTOTAL, CKMB, CKMBINDEX, TROPONINI in the last 168 hours. BNP (last 3 results) No results for input(s): PROBNP in the last 8760 hours. HbA1C: No results for input(s): HGBA1C in the last 72 hours. CBG: No results for input(s): GLUCAP in the last  168 hours. Lipid Profile: No results for input(s): CHOL, HDL, LDLCALC, TRIG, CHOLHDL, LDLDIRECT in the last 72 hours. Thyroid Function Tests: No results for input(s): TSH, T4TOTAL, FREET4, T3FREE, THYROIDAB in the last 72 hours. Anemia Panel: No results for input(s): VITAMINB12, FOLATE, FERRITIN, TIBC, IRON, RETICCTPCT in the last 72 hours. Sepsis Labs: No results for input(s): PROCALCITON, LATICACIDVEN in the last 168 hours.  Recent Results (from the past 240 hour(s))  MRSA PCR Screening     Status: Abnormal   Collection Time: 10/16/16  4:02 AM  Result Value Ref Range Status   MRSA by PCR POSITIVE (A) NEGATIVE Final    Comment:        The GeneXpert MRSA Assay (FDA approved for NASAL specimens only), is one component of a comprehensive MRSA colonization surveillance program. It is not intended to diagnose MRSA infection nor to guide or monitor treatment for MRSA infections. RESULT CALLED TO, READ BACK BY AND VERIFIED WITH: Adin Hector [id number] 06.01.2018 N. MORRIS           Radiology Studies: No results found.      Scheduled Meds: .  stroke: mapping our early stages of recovery book   Does not apply Once  . aspirin  300 mg Rectal Daily   Or  . aspirin  325 mg Oral Daily  . Chlorhexidine Gluconate Cloth  6 each Topical Q0600  . clopidogrel  75 mg Oral Daily  . diltiazem  240 mg Oral Daily  . enoxaparin (LOVENOX) injection  40 mg Subcutaneous Q24H  . escitalopram  10 mg Oral Daily  . losartan  100 mg Oral Daily  . mometasone-formoterol  2 puff Inhalation BID  . montelukast  10 mg Oral QHS  . mupirocin ointment  1 application Nasal BID  . nicotine  21 mg Transdermal Daily  . polyethylene glycol  17 g Oral Daily  . rosuvastatin  10 mg Oral Daily  . senna-docusate  1 tablet Oral BID  . thiamine  100 mg Oral Daily  . tiotropium  18 mcg Inhalation Daily  . Vitamin D (Ergocalciferol)  50,000 Units Oral Weekly   Continuous Infusions:    LOS: 3 days    Time spent: 40 mins    THOMPSON,DANIEL, MD [facility] Pager [phone]  If 7PM-7AM, please contact night-coverage [email or web address] Password [email or web address] 10/19/2016, 1:43 PM

## 2016-10-19 NOTE — Care Management Note (Signed)
Case Management Note  Patient Details  Name: [name] A Catapano MRN: [id number] Date of Birth: 17-Jul-1945  Subjective/Objective:    Pt admitted with CVA.   She is from home.             Action/Plan: Plan is for SNF. CSW aware. CM following.  Expected Discharge Date:  10/31/16               Expected Discharge Plan:  Skilled Nursing Facility  In-House Referral:  Clinical Social Work  Discharge planning Services  CM Consult  Post Acute Care Choice:    Choice offered to:     DME Arranged:    DME Agency:     HH Arranged:    HH Agency:     Status of Service:  In process, will continue to follow  If discussed at Long Length of Stay Meetings, dates discussed:    Additional Comments:  Kermit BaloKelli F Tauren Delbuono, RN 10/19/2016, 1:52 PM

## 2016-10-19 NOTE — NC FL2 (Signed)
[place] MEDICAID FL2 LEVEL OF CARE SCREENING TOOL     IDENTIFICATION  Patient Name: [name] Birthdate: [birth year] Sex: female Admission Date (Current Location): 10/15/2016  [facility] [place]   [facility] and Address[facility]  [facility], [address], [place], [place] [address]      Provider Number: [id number]  Attending Physician Name and Address:  Rodolph Bonghompson, Daniel V, MD  Relative Name and Phone Number:       Current Level of Care: Hospital Recommended Level of Care: Skilled Nursing Facility Prior Approval Number:    Date Approved/Denied:   PASRR Number: [id number]  Discharge Plan: SNF    Current Diagnoses: Patient Active Problem List   Diagnosis Date Noted  . 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 Stroke (HCC) 10/16/2016  . Elevated troponin   . Altered mental status 03/19/2016  . Acute on chronic renal failure (HCC) 01/13/2016  . Neuropathic pain   . Anxiety state   . Sleep disturbance   . Post-operative pain   . Spondylolisthesis of lumbar region 01/02/2016  . Surgery, elective   . Chronic obstructive pulmonary disease (HCC)   . Respiratory failure with hypercapnia (HCC)   . Chronic back pain   . Postoperative anemia due to acute blood loss   . Tachycardia   . Leukocytosis   . Thrombocytopenia (HCC)   . Lumbosacral spondylosis with radiculopathy 12/30/2015  . Acute encephalopathy   . AKI (acute kidney injury) (HCC)   . CKD (chronic kidney disease), stage II   . HEADACHE 08/16/2009  . Hyperlipidemia 02/20/2009  . TOBACCO ABUSE 02/20/2009  . Essential hypertension 02/20/2009  . ALLERGIC RHINITIS 02/20/2009  . Asthma 02/20/2009  . SHOULDER PAIN, RIGHT, CHRONIC 02/20/2009  . HIP PAIN, RIGHT, CHRONIC 02/20/2009  . SPINAL STENOSIS, CERVICAL 02/20/2009  . SPINAL STENOSIS, LUMBAR 02/20/2009  . BACK PAIN, LUMBAR 02/20/2009    Orientation RESPIRATION BLADDER Height  & Weight     Self, Situation, Place, Time  Normal Continent Weight: 136 lb (61.7 kg) Height:  5\' 6"  (167.6 cm)  BEHAVIORAL SYMPTOMS/MOOD NEUROLOGICAL BOWEL NUTRITION STATUS      Continent Diet (cardiac)  AMBULATORY STATUS COMMUNICATION OF NEEDS Skin   Limited Assist Verbally Normal                       Personal Care Assistance Level of Assistance  Bathing, Dressing Bathing Assistance: Limited assistance   Dressing Assistance: Limited assistance     Functional Limitations Info  Sight Sight Info: Impaired        SPECIAL CARE FACTORS FREQUENCY  PT (By licensed PT), OT (By licensed OT)     PT Frequency: 5x/wk OT Frequency: 5x/wk            Contractures      Additional Factors Info  Code Status, Allergies, Psychotropic, Isolation Precautions Code Status Info: Full Allergies Info: Shrimp (Shellfish allergy) Psychotropic Info: Lexapro 10mg    Isolation Precautions Info: MRSA     Current Medications (10/19/2016):  This is the current hospital active medication list Current Facility-Administered Medications  Medication Dose Route Frequency Provider Last Rate Last Dose  .  stroke: mapping our early stages of recovery book   Does not apply Once Eduard ClosKakrakandy, Arshad N, MD      . acetaminophen (TYLENOL) tablet 650 mg  650 mg Oral Q4H PRN Eduard ClosKakrakandy, Arshad N, MD   650 mg at 10/18/16 2220  Or  . acetaminophen (TYLENOL) solution 650 mg  650 mg Per Tube Q4H PRN Eduard Clos, MD       Or  . acetaminophen (TYLENOL) suppository 650 mg  650 mg Rectal Q4H PRN Eduard Clos, MD      . albuterol (PROVENTIL) (2.5 MG/3ML) 0.083% nebulizer solution 3 mL  3 mL Inhalation Q4H PRN Eduard Clos, MD   3 mL at 10/18/16 2208  . aspirin suppository 300 mg  300 mg Rectal Daily Eduard Clos, MD       Or  . aspirin tablet 325 mg  325 mg Oral Daily Eduard Clos, MD   325 mg at 10/19/16 1140  . Chlorhexidine Gluconate Cloth 2 % PADS 6 each  6 each Topical  Q0600 Rodolph Bong, MD   6 each at 10/19/16 0518  . clopidogrel (PLAVIX) tablet 75 mg  75 mg Oral Daily Marvel Plan, MD   75 mg at 10/19/16 1140  . diltiazem (CARDIZEM CD) 24 hr capsule 240 mg  240 mg Oral Daily Eduard Clos, MD   240 mg at 10/19/16 1140  . enoxaparin (LOVENOX) injection 40 mg  40 mg Subcutaneous Q24H Emi Holes, RPH   40 mg at 10/18/16 1610  . escitalopram (LEXAPRO) tablet 10 mg  10 mg Oral Daily Eduard Clos, MD   10 mg at 10/19/16 1141  . LORazepam (ATIVAN) tablet 0.5 mg  0.5 mg Oral TID PRN Rodolph Bong, MD   0.5 mg at 10/19/16 0134  . losartan (COZAAR) tablet 100 mg  100 mg Oral Daily Eduard Clos, MD   100 mg at 10/19/16 1142  . menthol-cetylpyridinium (CEPACOL) lozenge 3 mg  1 lozenge Oral PRN Rodolph Bong, MD   3 mg at 10/18/16 0323  . mometasone-formoterol (DULERA) 200-5 MCG/ACT inhaler 2 puff  2 puff Inhalation BID Eduard Clos, MD   2 puff at 10/18/16 2005  . montelukast (SINGULAIR) tablet 10 mg  10 mg Oral QHS Rodolph Bong, MD   10 mg at 10/18/16 2115  . mupirocin ointment (BACTROBAN) 2 % 1 application  1 application Nasal BID Rodolph Bong, MD   1 application at 10/18/16 2116  . nicotine (NICODERM CQ - dosed in mg/24 hours) patch 21 mg  21 mg Transdermal Daily Rodolph Bong, MD   21 mg at 10/19/16 1142  . polyethylene glycol (MIRALAX / GLYCOLAX) packet 17 g  17 g Oral Daily Rodolph Bong, MD   17 g at 10/18/16 [phone]  . rosuvastatin (CRESTOR) tablet 10 mg  10 mg Oral Daily Eduard Clos, MD   10 mg at 10/19/16 1140  . senna-docusate (Senokot-S) tablet 1 tablet  1 tablet Oral BID Rodolph Bong, MD   1 tablet at 10/18/16 2115  . thiamine (VITAMIN B-1) tablet 100 mg  100 mg Oral Daily Eduard Clos, MD   100 mg at 10/19/16 1142  . tiotropium (SPIRIVA) inhalation capsule 18 mcg  18 mcg Inhalation Daily Eduard Clos, MD   18 mcg at 10/17/16 0920  . Vitamin D  (Ergocalciferol) (DRISDOL) capsule 50,000 Units  50,000 Units Oral Weekly Eduard Clos, MD         Discharge Medications: Please see discharge summary for a list of discharge medications.  Relevant Imaging Results:  Relevant Lab Results:   Additional Information SS#: [id number]  Baldemar Lenis, LCSW

## 2016-10-19 NOTE — Progress Notes (Signed)
PT Cancellation Note  Patient Details Name: [name] MRN: [id number] DOB: 1946-04-02   Cancelled Treatment:    Reason Eval/Treat Not Completed: Patient at procedure or test/unavailable   Fabio Asaevon J Starling Christofferson 10/19/2016, 8:44 AM

## 2016-10-19 NOTE — H&P (View-Only) (Signed)
Ref: Rinaldo Cloud, MD   Subjective:  Awaiting TEE. Schedule full today. Patient has Monday morning 1st case for TEE.   Objective:  Vital Signs in the last 24 hours: Temp:  [98.4 F (36.9 C)-98.8 F (37.1 C)] 98.8 F (37.1 C) (06/01 1221) Pulse Rate:  [70-92] 76 (06/01 1221) Cardiac Rhythm: Normal sinus rhythm (06/01 0758) Resp:  [13-21] 17 (06/01 1221) BP: (109-141)/(73-99) 140/89 (06/01 1221) SpO2:  [96 %-100 %] 97 % (06/01 1221) Weight:  [65.6 kg (144 lb 10 oz)] 65.6 kg (144 lb 10 oz) (06/01 0404)  Physical Exam: BP Readings from Last 1 Encounters:  10/16/16 140/89    Wt Readings from Last 1 Encounters:  10/16/16 65.6 kg (144 lb 10 oz)    Weight change:  Body mass index is 23.34 kg/m. HEENT: [place]/AT, Eyes-Brown, PERL, EOMI, Conjunctiva-Pink, Sclera-Non-icteric Neck: No JVD, No bruit, Trachea midline. Lungs:  Clear, Bilateral. Cardiac:  Regular rhythm, normal S1 and S2, no S3. II/VI systolic murmur. Abdomen:  Soft, non-tender. BS present. Extremities:  No edema present. No cyanosis. No clubbing. CNS: AxOx3, Cranial nerves grossly intact except Left visual field deficit. She moves all 4 extremities.  Skin: Warm and dry.   Intake/Output from previous day: 05/31 0701 - 06/01 0700 In: 28.3 [I.V.:28.3] Out: -     Lab Results: BMET    Component Value Date/Time   NA 142 10/16/2016 0543   NA 143 10/15/2016 2214   NA 141 10/15/2016 2154   NA 140 02/26/2016 1512   K 4.1 10/16/2016 0543   K 3.8 10/15/2016 2214   K 3.8 10/15/2016 2154   CL 112 (H) 10/16/2016 0543   CL 110 10/15/2016 2214   CL 111 10/15/2016 2154   CO2 20 (L) 10/16/2016 0543   CO2 20 (L) 10/15/2016 2154   CO2 24 08/30/2016 2039   GLUCOSE 88 10/16/2016 0543   GLUCOSE 134 (H) 10/15/2016 2214   GLUCOSE 133 (H) 10/15/2016 2154   BUN 17 10/16/2016 0543   BUN 18 10/15/2016 2214   BUN 15 10/15/2016 2154   BUN 24 02/26/2016 1512   CREATININE 1.58 (H) 10/16/2016 0543   CREATININE 1.70 (H) 10/15/2016  2214   CREATININE 1.66 (H) 10/15/2016 2154   CALCIUM 10.2 10/16/2016 0543   CALCIUM 10.6 (H) 10/15/2016 2154   CALCIUM 9.9 08/30/2016 2039   GFRNONAA 32 (L) 10/16/2016 0543   GFRNONAA 30 (L) 10/15/2016 2154   GFRNONAA 36 (L) 08/30/2016 2039   GFRAA 37 (L) 10/16/2016 0543   GFRAA 35 (L) 10/15/2016 2154   GFRAA 42 (L) 08/30/2016 2039   CBC    Component Value Date/Time   WBC 10.7 (H) 10/15/2016 2154   RBC 4.58 10/15/2016 2154   HGB 14.6 10/15/2016 2214   HCT 43.0 10/15/2016 2214   HCT 35.7 02/26/2016 1512   PLT 211 10/15/2016 2154   PLT 286 02/26/2016 1512   MCV 93.2 10/15/2016 2154   MCV 89 02/26/2016 1512   MCH 30.6 10/15/2016 2154   MCHC 32.8 10/15/2016 2154   RDW 14.5 10/15/2016 2154   RDW 13.4 02/26/2016 1512   LYMPHSABS 2.3 10/15/2016 2154   MONOABS 0.8 10/15/2016 2154   EOSABS 0.3 10/15/2016 2154   BASOSABS 0.0 10/15/2016 2154   HEPATIC Function Panel  Recent Labs  08/30/16 2039 10/15/16 2154 10/16/16 0543  PROT 6.5 7.4 6.7   HEMOGLOBIN A1C No components found for: HGA1C,  MPG CARDIAC ENZYMES Lab Results  Component Value Date   CKTOTAL 89 03/20/2016  TROPONINI 0.13 (HH) 03/20/2016   TROPONINI 0.03 (HH) 03/20/2016   TROPONINI 0.03 (HH) 03/20/2016   BNP No results for input(s): PROBNP in the last 8760 hours. TSH  Recent Labs  03/21/16 0608  TSH 0.776   CHOLESTEROL  Recent Labs  10/16/16 0543  CHOL 130    Scheduled Meds: .  stroke: mapping our early stages of recovery book   Does not apply Once  . aspirin  300 mg Rectal Daily   Or  . aspirin  325 mg Oral Daily  . Chlorhexidine Gluconate Cloth  6 each Topical Q0600  . clopidogrel  75 mg Oral Daily  . diltiazem  240 mg Oral Daily  . enoxaparin (LOVENOX) injection  30 mg Subcutaneous Q24H  . escitalopram  10 mg Oral Daily  . losartan  100 mg Oral Daily  . mometasone-formoterol  2 puff Inhalation BID  . montelukast  10 mg Oral Daily  . mupirocin ointment  1 application Nasal BID  .  nicotine  21 mg Transdermal Daily  . rosuvastatin  10 mg Oral Daily  . thiamine  100 mg Oral Daily  . tiotropium  18 mcg Inhalation Daily  . [START ON 10/19/2016] Vitamin D (Ergocalciferol)  50,000 Units Oral Weekly   Continuous Infusions: . 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 Hypertension Hyperlipidemia COPD Tobacco abuse CKD, II  TEE on Monday.   LOS: 0 days    Orpah CobbAjay Chaunce Winkels  MD  10/16/2016, 3:14 PM

## 2016-10-19 NOTE — CV Procedure (Signed)
INDICATIONS:   The patient is 71 year old female with HTN has embolic stroke.[name].  PROCEDURE:  Informed consent was discussed including risks, benefits and alternatives for the procedure.  Risks include, but are not limited to, cough, sore throat, vomiting, nausea, somnolence, esophageal and stomach trauma or perforation, bleeding, low blood pressure, aspiration, pneumonia, infection, trauma to the teeth and death.    Patient was given sedation.  The oropharynx was anesthetized with topical lidocaine.  The transesophageal probe was inserted in the esophagus and stomach and multiple views were obtained.  Agitated saline was used after the transesophageal probe was removed from the body.  The patient was kept under observation until the patient left the procedure room.  The patient left the procedure room in stable condition.   COMPLICATIONS:  There were no immediate complications.  FINDINGS:  1. LEFT VENTRICLE: The left ventricle is normal in structure and function.  Wall motion is normal.  No thrombus or masses seen in the left ventricle.  2. RIGHT VENTRICLE:  The right ventricle is normal in structure and function without any thrombus or masses.    3. LEFT ATRIUM:  The left atrium is normal without any thrombus or masses.  4. LEFT ATRIAL APPENDAGE:  The left atrial appendage is free of any thrombus or masses.  5. RIGHT ATRIUM:  The right atrium is free of any thrombus or masses.    6. ATRIAL SEPTUM:  The atrial septum is normal without any ASD or PFO.  7. MITRAL VALVE:  The mitral valve is normal in structure and function with trivial regurgitation, no masses, stenosis or vegetations.  8. TRICUSPID VALVE:  The tricuspid valve is normal in structure and function withtrivial regurgitation, no masses, stenosis or vegetations.  9. AORTIC VALVE:  The aortic valve is normal in structure and function with trivial regurgitation, masses, stenosis or vegetations.  10. PULMONIC VALVE:  The pulmonic  valve is normal in structure and function with trivial regurgitation, masses, stenosis or vegetations.  11. AORTIC ARCH, ASCENDING AND DESCENDING AORTA:  The aorta had no atherosclerosis in the ascending or aortic arch.  12.  Superior Vena Cava : No thrombus or catheter.  13.  Pulmonary Veins: Visible.  14.  Pulmonary artery: visible and normal.   IMPRESSION:   1. Normal LV systolic function. 2. No evidence of thrombus or vegetations or PFO.[name].  RECOMMENDATIONS:    Medical treatment..[name]

## 2016-10-19 NOTE — Consult Note (Signed)
ELECTROPHYSIOLOGY CONSULT NOTE  Patient ID: [name] MRN: [id number], DOB/AGE: 1945-10-31   Admit date: 10/15/2016 Date of Consult: 10/19/2016  Primary Physician: Rinaldo Cloud, MD Reason for Consultation: Cryptogenic stroke; recommendations regarding Implantable Loop Recorder  History of Present Illness [name] is a patient EP has been asked to see by Dr Roda Shutters for evaluation for implantable loop recorder in the setting of cryptogenic stroke. She was admitted on 10/15/2016 with headache and left facial droop.   Imaging demonstrated right PCA and L cerebellar infarcts felt to be embolic 2/2 unknown source.  She has undergone workup for stroke including echocardiogram and carotid dopplers.  The patient has been monitored on telemetry which has demonstrated sinus rhythm with no arrhythmias.  Inpatient stroke work-up is to be completed with a TEE.   Echocardiogram this admission demonstrated EF 50-55%, no RWMA, grade 1 diastolic dysfunction, LA 34.  Lab work is reviewed.  Prior to admission, the patient denies chest pain, shortness of breath, dizziness, palpitations, or syncope.  They are recovering from their stroke with plans to go to SNF at discharge.  EP has been asked to evaluate for placement of an implantable loop recorder to monitor for atrial fibrillation.  Past Medical History:  Diagnosis Date  . ALLERGIC RHINITIS   . Arthritis   . ASTHMA, UNSPECIFIED, UNSPECIFIED STATUS   . DYSLIPIDEMIA   . Headache(784.0)   . Hyperlipidemia 02/20/2009   Qualifier: Diagnosis of  By: [name] MD, [name] HYPERTENSION   . Hypertension   . Mild renal insufficiency   . PNA (pneumonia)   . Shortness of breath dyspnea   . SPINAL STENOSIS, LUMBAR    MRI 12/2005; surg 2004  . TOBACCO ABUSE      Surgical History:  Past Surgical History:  Procedure Laterality Date  . ANTERIOR LAT LUMBAR FUSION Right 12/30/2015   Procedure: Lumbar two-three, Lumbar three-four Anterior Lateral  Lumbar Interbody Fusion with Anterior column realignment at Lumbar three-four;  Surgeon: [name], MD;  Location: [facility];  Service: Neurosurgery;  Laterality: Right;  . Closure of bronchopleural fistula, right lower lobe.    [name] COLONOSCOPY    . Decompressive Laminectomy     Fusion L4-5, L5-S1 (Cram-2004)  . Decortication of right lower lobe    . Placement of wound On-Q analgesia irrigation system.    [name] POSTERIOR LUMBAR FUSION 4 LEVEL N/A 12/30/2015   Procedure: Lumbar two -Sacral one Dorsal Internal Fixation and Fusion, [name] osteotomies Lumbar two-three, Lumbar three-four;  Surgeon: [name], MD;  Location: [facility];  Service: Neurosurgery;  Laterality: N/A;  . Right hand  1995  . Right knee  1990  . Right video-assisted thoracoscopic surgery, drainage of empyema.  02/12/2011     Prescriptions Prior to Admission  Medication Sig Dispense Refill Last Dose  . albuterol (PROVENTIL HFA;VENTOLIN HFA) 108 (90 Base) MCG/ACT inhaler Inhale 2 puffs into the lungs every 4 (four) hours as needed for wheezing or shortness of breath. 1 Inhaler 2 10/15/2016 at Unknown time  . diltiazem (DILACOR XR) 240 MG 24 hr capsule Take 240 mg by mouth daily.   10/15/2016 at Unknown time  . escitalopram (LEXAPRO) 10 MG tablet Take 10 mg by mouth daily.   10/15/2016 at Unknown time  . Fluticasone-Salmeterol (ADVAIR DISKUS) 250-50 MCG/DOSE AEPB Inhale 1 puff into the lungs 2 (two) times daily.     10/15/2016 at Unknown time  . losartan (COZAAR) 100 MG tablet  Take 1 tablet by mouth daily.  0 10/15/2016 at Unknown time  . rosuvastatin (CRESTOR) 10 MG tablet Take 10 mg by mouth daily.    10/15/2016 at Unknown time  . SINGULAIR 10 MG tablet Take 1 tablet by mouth daily.  0 10/15/2016 at Unknown time  . thiamine (VITAMIN B-1) 100 MG tablet Take 100 mg by mouth daily.  0 10/15/2016 at Unknown time  . tiotropium (SPIRIVA) 18 MCG inhalation capsule Place 18 mcg into inhaler and inhale daily.      10/15/2016 at Unknown time  . Vitamin D, Ergocalciferol, (DRISDOL) 50000 units CAPS capsule Take 50,000 Units by mouth once a week.  0 Past Week at Unknown time  . fluconazole (DIFLUCAN) 150 MG tablet take 1 tablet by mouth AS A SINGLE DOSE (Patient not taking: Reported on 10/16/2016) 1 tablet 3 Completed Course at Unknown time  . predniSONE (DELTASONE) 10 MG tablet Take 5 tablets (50 mg total) by mouth daily. (Patient not taking: Reported on 10/16/2016) 20 tablet 0 Completed Course at Unknown time    Inpatient Medications:  .  stroke: mapping our early stages of recovery book   Does not apply Once  . aspirin  300 mg Rectal Daily   Or  . aspirin  325 mg Oral Daily  . Chlorhexidine Gluconate Cloth  6 each Topical Q0600  . clopidogrel  75 mg Oral Daily  . diltiazem  240 mg Oral Daily  . enoxaparin (LOVENOX) injection  40 mg Subcutaneous Q24H  . escitalopram  10 mg Oral Daily  . losartan  100 mg Oral Daily  . mometasone-formoterol  2 puff Inhalation BID  . montelukast  10 mg Oral QHS  . mupirocin ointment  1 application Nasal BID  . nicotine  21 mg Transdermal Daily  . polyethylene glycol  17 g Oral Daily  . rosuvastatin  10 mg Oral Daily  . senna-docusate  1 tablet Oral BID  . thiamine  100 mg Oral Daily  . tiotropium  18 mcg Inhalation Daily  . Vitamin D (Ergocalciferol)  50,000 Units Oral Weekly    Allergies:  Allergies  Allergen Reactions  . Shrimp [Shellfish Allergy] Hives and Swelling    Social History   Social History  . Marital status: Single    Spouse name: N/A  . Number of children: N/A  . Years of education: N/A   Occupational History  . Not on file.   Social History Main Topics  . Smoking status: Current Every Day Smoker    Packs/day: 0.25    Years: 40.00    Types: Cigarettes  . 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 Alcohol use No  . Drug use: No  . Sexual activity: No     Comment:  no sex x 20 yrs   Other Topics Concern  . Not on file   Social History Narrative  . No narrative on file     Family History  Problem Relation Age of Onset  . Mental illness Mother   . Hypertension Mother   . Arthritis Other   . Hypertension Other       Review of Systems: All other systems reviewed and are otherwise negative except as noted above.  Physical Exam: Vitals:   10/18/16 1102 10/18/16 1411 10/18/16 2102 10/19/16 0103  BP: 135/86 129/76 125/78 (!) 146/79  Pulse: 92 92 89 81  Resp: 18 18 18  18  Temp: 99.2 F (37.3 C) 98.9 F (37.2 C) 99 F (37.2 C) 98.7 F (37.1 C)  TempSrc: Oral Oral Oral Oral  SpO2: 99% 96% 98% 98%  Weight:      Height:        GEN- The patient is well appearing, alert and oriented x 3 today, but needing redirection.   Head- normocephalic, atraumatic Eyes-  Sclera clear, conjunctiva pink Ears- hearing intact Oropharynx- clear Neck- supple Lungs- Clear to ausculation bilaterally, normal work of breathing Heart- Regular rate and rhythm  GI- soft, NT, ND, + BS Extremities- no clubbing, cyanosis, or edema MS- no significant deformity or atrophy Skin- no rash or lesion Psych- euthymic mood, full affect   Labs:   Lab Results  Component Value Date   WBC 7.2 10/19/2016   HGB 12.5 10/19/2016   HCT 38.8 10/19/2016   MCV 92.8 10/19/2016   PLT 167 10/19/2016    Recent Labs Lab 10/16/16 0543  10/19/16 0547  NA 142  < > 142  K 4.1  < > 3.8  CL 112*  < > 112*  CO2 20*  < > 21*  BUN 17  < > 7  CREATININE 1.58*  < > 1.04*  CALCIUM 10.2  < > 9.9  PROT 6.7  --   --   BILITOT 0.7  --   --   ALKPHOS 52  --   --   ALT 11*  --   --   AST 20  --   --   GLUCOSE 88  < > 95  < > = values in this interval not displayed.   Radiology/Studies: Ct Head Wo Contrast Result Date: 10/15/2016 CLINICAL DATA:  Acute onset of headache and dizziness, after fall. Initial encounter. EXAM: CT HEAD WITHOUT CONTRAST TECHNIQUE: Contiguous axial images were  obtained from the base of the skull through the vertex without intravenous contrast. COMPARISON:  CT of the head performed 10/13/2016 FINDINGS: Brain: There is an evolving acute infarct at the right occipital lobe, with associated mass-effect. There is no evidence of hemorrhagic transformation. No definite mass is identified. Scattered periventricular and subcortical white matter change likely reflects small vessel ischemic microangiopathy. The brainstem and fourth ventricle are within normal limits. The basal ganglia are unremarkable in appearance. No midline shift is seen. Vascular: No hyperdense vessel or unexpected calcification. Skull: There is no evidence of fracture; visualized osseous structures are unremarkable in appearance. Sinuses/Orbits: The orbits are within normal limits. The paranasal sinuses and mastoid air cells are well-aerated. Other: No significant soft tissue abnormalities are seen. IMPRESSION: 1. Evolving acute infarct at the right occipital lobe, with associated mass effect. No evidence of hemorrhagic transformation. 2. Scattered small vessel ischemic microangiopathy. These results were called by telephone at the time of interpretation on 10/15/2016 at 10:59 pm to Dr. Gwyneth Sprout, who verbally acknowledged these results. Electronically Signed   By: Roanna Raider M.D.   On: 10/15/2016 23:01   Ct Head Wo Contrast Result Date: 10/13/2016 CLINICAL DATA:  Acute onset severe headache EXAM: CT HEAD WITHOUT CONTRAST TECHNIQUE: Contiguous axial images were obtained from the base of the skull through the vertex without intravenous contrast. COMPARISON:  03/19/2016, 08/06/2016 FINDINGS: Brain: Stable advanced chronic white matter microvascular ischemic changes throughout the cerebral hemispheres. No acute intracranial hemorrhage, mass lesion, definite new infarction, midline shift, herniation, hydrocephalus, or extra-axial fluid collection. Normal gray-white matter differentiation. No focal mass  effect or edema. Cisterns are patent. No cerebellar abnormality. Vascular: No hyperdense vessel or  unexpected calcification. Skull: Normal. Negative for fracture or focal lesion. Sinuses/Orbits: No acute finding. Other: None. IMPRESSION: Stable advanced chronic white matter microvascular ischemic changes. No interval change or acute process by noncontrast CT. Electronically Signed   By: Judie Petit.  Shick M.D.   On: 10/13/2016 09:20   Mr [name] Head Wo Contrast Result Date: 10/16/2016 CLINICAL DATA:  Headaches and visual changes. EXAM: MRI HEAD WITHOUT CONTRAST MRA HEAD WITHOUT CONTRAST TECHNIQUE: Multiplanar, multiecho pulse sequences of the brain and surrounding structures were obtained without intravenous contrast. Angiographic images of the head were obtained using MRA technique without contrast. COMPARISON:  1. Head CT 10/15/2016 2. Brain MRI 08/06/2016 FINDINGS: MRI HEAD FINDINGS Brain: The midline structures are normal. There is diffusion restriction throughout the right posterior cerebral artery distribution. There is a small focus of diffusion restriction in the left cerebellum measuring approximately 10 mm. The diffusion restriction extends into the lateral aspect of the right thalamus. There is associated hyperintense T2 weighted signal at the above-described locations. There is confluent hyperintense T2-weighted signal within the periventricular white matter, most often seen in the setting of chronic microvascular ischemia. No intraparenchymal hematoma or chronic microhemorrhage. Brain volume is normal for age without age-advanced or lobar predominant atrophy. The dura is normal and there is no extra-axial collection. Skull and upper cervical spine: The visualized skull base, calvarium, upper cervical spine and extracranial soft tissues are normal. Sinuses/Orbits: No fluid levels or advanced mucosal thickening. No mastoid effusion. Normal orbits. MRA HEAD FINDINGS The time-of-flight images are degraded by motion.  Intracranial internal carotid arteries: Normal. Anterior cerebral arteries: Normal. Middle cerebral arteries: Multifocal atherosclerotic narrowing on the right. No advanced left MCA stenosis. Posterior communicating arteries: Absent bilaterally. Posterior cerebral arteries: There is loss of the normal flow related enhancement of the right posterior cerebral artery near the P2 P3 junction. Basilar artery: Normal. Vertebral arteries: Left dominant. Normal. Superior cerebellar arteries: Normal. Anterior inferior cerebellar arteries: Not clearly visualized. Posterior inferior cerebellar arteries: Normal. IMPRESSION: 1. Acute ischemia throughout the right posterior cerebral artery distribution without hemorrhage or significant mass effect. 2. Small focus of acute ischemia in the left cerebellum. Bilateral distribution of lesions may indicate a central embolic process. 3. Severe stenosis or occlusion of the right posterior cerebral artery near the junction of the P2 and P3 segments. 4. Otherwise motion-degraded MRA without large vessel occlusion. Electronically Signed   By: Deatra Robinson M.D.   On: 10/16/2016 04:03   12-lead ECG sinus tach, rate 101 All prior EKG's in EPIC reviewed with no documented atrial fibrillation  Telemetry sinus rhythm  Assessment and Plan:  1. Cryptogenic stroke The patient presents with cryptogenic stroke.  The patient has a TEE planned for this AM.  I spoke at length with the patient about monitoring for afib with an implantable loop recorder.  Risks, benefits, and alteratives to implantable loop recorder were discussed with the patient today.   At this time, the patient is very clear in their decision to proceed with implantable loop recorder.  Daughter has provided informed consent.  Wound care was reviewed with the patient (keep incision clean and dry for 3 days).    Please call with questions.   Gypsy Balsam, NP 10/19/2016 7:37 AM  Pt seen and examined   chart reviewed     She has persistent visual defects  Double infarcts with some intercranial vascular stenosis, but still concerning for embolism per neuro\  Hence with neg TEE will proceed with [facility]

## 2016-10-19 NOTE — Progress Notes (Signed)
Physical Therapy Treatment Patient Details Name: [name] MRN: [id number] DOB: 1945/05/22 Today's Date: 10/19/2016    History of Present Illness Patient is a 72 y/o female who presents with visual deficits, HA and left sided weakness. Head CT- Evolving acute infarct at the right occipital lobe. MRI- infarct in right cerebral posterior artery and left cerebellum. PMH includes HTN, COPD, tobacco use, anxiety.    PT Comments    Pt very motivated during therapy session and states she wants to get back to walking independently. Pt presents with LE weakness and decreased functional mobility. Pt requires 1 person hand held assistance and min A around pelvis for stability during gait. Pt presents with shuffled gait and decreased gait speed. Pt would benefit from continued skilled physical therapy to improve balance, strength and functional mobility to return to prior level of function. Pt not approved for CIR, therefore PT recommending SNF due to pt unsafe to return home at this time.   Follow Up Recommendations  SNF;Supervision for mobility/OOB     Equipment Recommendations  None recommended by PT    Recommendations for Other Services       Precautions / Restrictions Precautions Precautions: Fall Precaution Comments: left visual field cut; left neglect of environment Restrictions Weight Bearing Restrictions: No    Mobility  Bed Mobility Overal bed mobility: Needs Assistance Bed Mobility: Supine to Sit     Supine to sit: Supervision     General bed mobility comments: Increased time to sit up to edge of bed  Transfers Overall transfer level: Needs assistance Equipment used: 1 person hand held assist Transfers: Sit to/from Stand Sit to Stand: Min assist         General transfer comment: Pt able to power up to standing, but requires hand held assistance to maintain balance.   Ambulation/Gait Ambulation/Gait assistance: Min assist Ambulation Distance (Feet): 60  Feet Assistive device: 1 person hand held assist Gait Pattern/deviations: Step-through pattern;Decreased stride length;Shuffle;Drifts right/left;Narrow base of support Gait velocity: decreased Gait velocity interpretation: Below normal speed for age/gender General Gait Details: Pt presents with very small stride length and shuffled gait. VCs to increase stride length, however unable to maintain for more than 2 steps. Pt required 1 person hand held assistance and wrap around pelvis for stability during gait.   Stairs            Wheelchair Mobility    Modified Rankin (Stroke Patients Only) Modified Rankin (Stroke Patients Only) Pre-Morbid Rankin Score: Slight disability Modified Rankin: Moderately severe disability     Balance Overall balance assessment: Needs assistance Sitting-balance support: No upper extremity supported;Feet supported Sitting balance-Leahy Scale: Good Sitting balance - Comments: pt able to don rob while seated edge of bed   Standing balance support: Single extremity supported;During functional activity Standing balance-Leahy Scale: Poor Standing balance comment: Requires UE support for balance.                            Cognition Arousal/Alertness: Awake/alert Behavior During Therapy: WFL for tasks assessed/performed Overall Cognitive Status: Within Functional Limits for tasks assessed                                        Exercises      General Comments General comments (skin integrity, edema, etc.): Pt's daughter and son in law present during session  Pertinent Vitals/Pain Pain Assessment: No/denies pain    Home Living                      Prior Function            PT Goals (current goals can now be found in the care plan section) Acute Rehab PT Goals Patient Stated Goal: to get back to walk and see PT Goal Formulation: With patient/family Time For Goal Achievement: 10/30/16 Potential to  Achieve Goals: Good Progress towards PT goals: Progressing toward goals    Frequency    Min 4X/week      PT Plan Discharge plan needs to be updated    Co-evaluation              AM-PAC PT "6 Clicks" Daily Activity  Outcome Measure  Difficulty turning over in bed (including adjusting bedclothes, sheets and blankets)?: None Difficulty moving from lying on back to sitting on the side of the bed? : None Difficulty sitting down on and standing up from a chair with arms (e.g., wheelchair, bedside commode, etc,.)?: None Help needed moving to and from a bed to chair (including a wheelchair)?: A Little Help needed walking in hospital room?: A Little Help needed climbing 3-5 steps with a railing? : A Lot 6 Click Score: 20    End of Session Equipment Utilized During Treatment: Gait belt Activity Tolerance: Patient tolerated treatment well Patient left: in bed;with call bell/phone within reach;with bed alarm set;with family/visitor present Nurse Communication: Mobility status PT Visit Diagnosis: Unsteadiness on feet (R26.81);Difficulty in walking, not elsewhere classified (R26.2);Other symptoms and signs involving the nervous system (R29.898)     Time: [id number] PT Time Calculation (min) (ACUTE ONLY): 22 min  Charges:  $Gait Training: 8-22 mins                    G Codes:       Corlis Leakaylor [place], SPT  [phone]   Corlis Leakaylor Guardal[place]ene 10/19/2016, 2:53 PM

## 2016-10-19 NOTE — Progress Notes (Signed)
OT Cancellation Note  Patient Details Name: Amalia Haileyoni A Scheibe MRN: [id number] DOB: 1946/01/11   Cancelled Treatment:    Reason Eval/Treat Not Completed: Patient at procedure or test/ unavailable. Pt off unit for procedure at this time. Will check back as able.  Doristine Sectionharity A Kamani Lewter, MS OTR/L  Pager: [phone]   Annye Forrey A Jeni Duling 10/19/2016, 7:57 AM

## 2016-10-19 NOTE — Progress Notes (Signed)
Subjective:  No new neuro complaints continues to have left eye vision loss. Tolerated TEE and local record or insertion. Denies any chest pain or shortness of breath.  Objective:  Vital Signs in the last 24 hours: Temp:  [98.3 F (36.8 C)-99.1 F (37.3 C)] 98.8 F (37.1 C) (06/04 1703) Pulse Rate:  [81-116] 100 (06/04 1703) Resp:  [11-30] 20 (06/04 1703) BP: (125-200)/(74-155) 132/84 (06/04 1703) SpO2:  [95 %-100 %] 97 % (06/04 1703) Weight:  [61.7 kg (136 lb)] 61.7 kg (136 lb) (06/04 0806)  Intake/Output from previous day: No intake/output data recorded. Intake/Output from this shift: Total I/O In: 480 [P.O.:480] Out: -   Physical Exam: Neck: no adenopathy, no carotid bruit, no JVD and supple, symmetrical, trachea midline Lungs: clear to auscultation bilaterally Heart: regular rate and rhythm, S1, S2 normal and Sounds systolic murmur noted Abdomen: soft, non-tender; bowel sounds normal; no masses,  no organomegaly Extremities: extremities normal, atraumatic, no cyanosis or edema  Lab Results:  Recent Labs  10/18/16 0825 10/19/16 0547  WBC 7.3 7.2  HGB 12.9 12.5  PLT 190 167    Recent Labs  10/18/16 0825 10/19/16 0547  NA 142 142  K 3.7 3.8  CL 115* 112*  CO2 20* 21*  GLUCOSE 98 95  BUN 8 7  CREATININE 1.00 1.04*   No results for input(s): TROPONINI in the last 72 hours.  Invalid input(s): CK, MB Hepatic Function Panel No results for input(s): PROT, ALBUMIN, AST, ALT, ALKPHOS, BILITOT, BILIDIR, IBILI in the last 72 hours. No results for input(s): CHOL in the last 72 hours. No results for input(s): PROTIME in the last 72 hours.  Imaging: Imaging results have been reviewed and No results found.  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 Small focus of acute ischemia in the left cerebellum, rule out cardioembolic CVA. Hypertension. Hyperlipidemia. COPD History of bronchial asthma. Tobacco  abuse. Chronic kidney disease stage II. Degenerative joint disease. History of spinal stenosis. Plan Continue present Management I'll sign off please call if needed  LOS: 3 days    Rinaldo CloudHarwani, Chalese Peach 10/19/2016, 5:53 PM

## 2016-10-20 ENCOUNTER — Encounter (HOSPITAL_COMMUNITY): Payer: Self-pay | Admitting: Cardiovascular Disease

## 2016-10-20 MED ORDER — ASPIRIN 325 MG PO TABS: 325.0000 mg | ORAL_TABLET | Freq: Every day | ORAL | Status: AC

## 2016-10-20 MED ORDER — POLYETHYLENE GLYCOL 3350 17 G PO PACK: 17.0000 g | PACK | Freq: Every day | ORAL | 0 refills | Status: AC

## 2016-10-20 MED ORDER — CLOPIDOGREL BISULFATE 75 MG PO TABS: 75.0000 mg | ORAL_TABLET | Freq: Every day | ORAL | 1 refills | Status: AC

## 2016-10-20 MED ORDER — LORAZEPAM 0.5 MG PO TABS: 0.5000 mg | ORAL_TABLET | Freq: Two times a day (BID) | ORAL | 0 refills | Status: DC | PRN

## 2016-10-20 MED ORDER — SENNOSIDES-DOCUSATE SODIUM 8.6-50 MG PO TABS: 1.0000 | ORAL_TABLET | Freq: Two times a day (BID) | ORAL | Status: AC

## 2016-10-20 MED ORDER — NICOTINE 21 MG/24HR TD PT24: 21.0000 mg | MEDICATED_PATCH | Freq: Every day | TRANSDERMAL | 1 refills | Status: DC

## 2016-10-20 NOTE — Care Management Note (Signed)
Case Management Note  Patient Details  Name: [name] MRN: [id number] Date of Birth: Jun 16, 1945  Subjective/Objective:                    Action/Plan: Pt discharging to SNF. No further needs per CM.   Expected Discharge Date:  10/20/16               Expected Discharge Plan:  Skilled Nursing Facility  In-House Referral:  Clinical Social Work  Discharge planning Services  CM Consult  Post Acute Care Choice:    Choice offered to:     DME Arranged:    DME Agency:     HH Arranged:    HH Agency:     Status of Service:  Completed, signed off  If discussed at [employer] Meetings, dates discussed:    Additional Comments:  Kermit BaloKelli F Hazle Ogburn, RN 10/20/2016, 1:26 PM

## 2016-10-20 NOTE — Progress Notes (Signed)
Pt d/c to SNF, no new concerns, education provided, no evidence of learning. Pt will be transported out of hospital by [facility]. [facility].

## 2016-10-20 NOTE — Progress Notes (Signed)
Physical Therapy Treatment Patient Details Name: [name] MRN: [id number] DOB: 1946/04/30 Today's Date: 10/20/2016    History of Present Illness Patient is a 71 y/o female who presents with visual deficits, HA and left sided weakness. Head CT- Evolving acute infarct at the right occipital lobe. MRI- infarct in right cerebral posterior artery and left cerebellum. PMH includes HTN, COPD, tobacco use, anxiety.    PT Comments    Patient assist with functional task performance and OOB to chair activities this am, deferring ambulation at this time. Will try back as time permits to progress mobility. SNF remains appropriate.   Follow Up Recommendations  SNF;Supervision for mobility/OOB     Equipment Recommendations  None recommended by PT    Recommendations for Other Services       Precautions / Restrictions Precautions Precautions: Fall Precaution Comments: left visual field cut; inattention to L side of body and environment.  Restrictions Weight Bearing Restrictions: No    Mobility  Bed Mobility Overal bed mobility: Needs Assistance Bed Mobility: Supine to Sit     Supine to sit: Min guard     General bed mobility comments: Increased time and VC's for problem solving. Pt attempting to scoot up in bed rather than bring legs off of bed.   Transfers Overall transfer level: Needs assistance Equipment used: 1 person hand held assist Transfers: Sit to/from [employer] Sit to Stand: Min assist Stand pivot transfers: Min assist       General transfer comment: Pt requiring assistance for balance during stand-pivot transfers.   Ambulation/Gait Ambulation/Gait assistance: Min assist Ambulation Distance (Feet): 8 Feet Assistive device: 1 person hand held assist Gait Pattern/deviations: Step-through pattern;Decreased stride length;Shuffle;Drifts right/left;Narrow base of support Gait velocity: decreased Gait velocity interpretation: Below normal speed for  age/gender General Gait Details: Vcs to direct to functional tasks   Stairs            Wheelchair Mobility    Modified Rankin (Stroke Patients Only) Modified Rankin (Stroke Patients Only) Pre-Morbid Rankin Score: Slight disability Modified Rankin: Moderately severe disability     Balance Overall balance assessment: Needs assistance Sitting-balance support: No upper extremity supported;Feet supported Sitting balance-Leahy Scale: Good     Standing balance support: Single extremity supported;During functional activity Standing balance-Leahy Scale: Poor Standing balance comment: continues to requires UE support for balance.                            Cognition Arousal/Alertness: Awake/alert Behavior During Therapy: WFL for tasks assessed/performed Overall Cognitive Status: Within Functional Limits for tasks assessed Area of Impairment: Attention;Safety/judgement;Awareness;Problem solving                   Current Attention Level: Sustained     Safety/Judgement: Decreased awareness of deficits;Decreased awareness of safety Awareness: Emergent Problem Solving: Slow processing;Difficulty sequencing;Decreased initiation;Requires verbal cues;Requires tactile cues General Comments: patient very tangential throughout session, difficulty to focus to direct task.       Exercises      General Comments General comments (skin integrity, edema, etc.): performed functional tasks in room prior to OOB to chair, patient perseverating on wanting to eat and have coffee. Assisted patient with task sequencing with cues for visual scanning during tasks      Pertinent Vitals/Pain Pain Assessment: 0-10 Pain Score: 5  Pain Location: headache Pain Descriptors / Indicators: Headache Pain Intervention(s): Patient requesting pain meds-RN notified    Home Living  Prior Function            PT Goals (current goals can now be found in the  care plan section) Acute Rehab PT Goals Patient Stated Goal: to get back to walk and see PT Goal Formulation: With patient/family Time For Goal Achievement: 10/30/16 Potential to Achieve Goals: Good Progress towards PT goals: Progressing toward goals    Frequency    Min 4X/week      PT Plan Discharge plan needs to be updated    Co-evaluation              AM-PAC PT "6 Clicks" Daily Activity  Outcome Measure  Difficulty turning over in bed (including adjusting bedclothes, sheets and blankets)?: A Little Difficulty moving from lying on back to sitting on the side of the bed? : A Little Difficulty sitting down on and standing up from a chair with arms (e.g., wheelchair, bedside commode, etc,.)?: A Little Help needed moving to and from a bed to chair (including a wheelchair)?: A Little Help needed walking in hospital room?: A Little Help needed climbing 3-5 steps with a railing? : A Lot 6 Click Score: 17    End of Session Equipment Utilized During Treatment: Gait belt Activity Tolerance: Patient tolerated treatment well Patient left: in chair;with call bell/phone within reach;with chair alarm set Nurse Communication: Mobility status PT Visit Diagnosis: Unsteadiness on feet (R26.81);Difficulty in walking, not elsewhere classified (R26.2);Other symptoms and signs involving the nervous system (R29.898)     Time: [id number] PT Time Calculation (min) (ACUTE ONLY): 17 min  Charges:  $Therapeutic Activity: 8-22 mins                    G Codes:       Charlotte Crumbevon Danyelle Brookover, PT DPT  [phone]    Fabio Asaevon J Nyella Eckels 10/20/2016, 10:14 AM

## 2016-10-20 NOTE — Progress Notes (Signed)
Discharge to: [facility] Anticipated discharge date: 10/20/16 Family notified: Yes, by phone Transportation by: [facility]  Report #: [phone] [id number], Room [address]  CSW signing off.  Blenda Nicelylizabeth Elfreida Heggs LCSW [phone]

## 2016-10-20 NOTE — Progress Notes (Signed)
  Speech Language Pathology Treatment: Cognitive-Linquistic  Patient Details Name: [name] MRN: [id number] DOB: [birth year] Today's Date: 10/20/2016 Time: 1040-1140 SLP Time Calculation (min) (ACUTE ONLY): 60 min  Assessment / Plan / Recommendation Clinical Impression  Pt seen for cognitive-linguistic tx - agitated, with verbal impulsivity, poor self-regulation/monitoring.  Responsive to verbal/tactile cues to calm down and listen, at which point she was better able to inhibit impulsive behavior.  Assisted with lunch meal; moderate cues for attending to items on left.  Max cues for alternating attention between self-feeding and communication; max cues for task/conversation maintenance. Pt benefits from verbal directions/structure provided before beginning a task.  SLP will continue to follow.   HPI HPI: 71 year-old female presents with visual deficits, HA and left sided weakness.  Dx right occipital and left cerebellar infarcts. Hx of lumbar fusion Aug 2017 leading to CIR stay.  Pt D/Cd at that time with moderate cognitive deficits related to sustained attention, problem solving, recall, and intellectual awareness.      SLP Plan  Continue with current plan of care       Recommendations                   Follow up Recommendations: Skilled Nursing facility- CIR recommended SNF. SLP Visit Diagnosis: Cognitive communication deficit (W09.811(R41.841) Plan: Continue with current plan of care       GO                Blenda MountsCouture, Inman Fettig Laurice 10/20/2016, 11:50 AM

## 2016-10-20 NOTE — Progress Notes (Signed)
Physical Therapy Treatment Patient Details Name: Mackenzie A HenderAmalia Haileyson MRN: [id number] DOB: 1945/07/05 Today's Date: 10/20/2016    History of Present Illness Patient is a 71 y/o female who presents with visual deficits, HA and left sided weakness. Head CT- Evolving acute infarct at the right occipital lobe. MRI- infarct in right cerebral posterior artery and left cerebellum. PMH includes HTN, COPD, tobacco use, anxiety.    PT Comments    Pt continues to be limited by gross weakness, balance deficits, and decreased mobility. Pt also limited by left visual field cut. Pt able to tolerate 5360ft of gait with 1 person hand held assist. VCs required to increase stride length and cadence with pt unable to maintain for more than 5 steps. Max VCs provided throughout to stay focused on task.  Pt would benefit from continued skilled physical therapy to address deficits and improve functional mobility. Current plan of care remains appropriate.   Follow Up Recommendations  SNF;Supervision for mobility/OOB     Equipment Recommendations  None recommended by PT    Recommendations for Other Services       Precautions / Restrictions Precautions Precautions: Fall Precaution Comments: left visual field cut; inattention to L side of body and environment.  Restrictions Weight Bearing Restrictions: No    Mobility  Bed Mobility Overal bed mobility: Needs Assistance Bed Mobility: Sit to Supine     Supine to sit: Min Aireal Slater Sit to supine: Supervision   General bed mobility comments: Recieved on BSC at start of session. Pt supervision for return to bed for safety.  Transfers Overall transfer level: Needs assistance Equipment used: 1 person hand held assist Transfers: Sit to/from Stand Sit to Stand: Min assist Stand pivot transfers: Min assist       General transfer comment: Pt requires UE support to maintain balance upon standing   Ambulation/Gait Ambulation/Gait assistance: Min assist Ambulation  Distance (Feet): 60 Feet Assistive device: 1 person hand held assist Gait Pattern/deviations: Step-through pattern;Decreased stride length;Narrow base of support Gait velocity: decreased Gait velocity interpretation: Below normal speed for age/gender General Gait Details: VCs to increase cadence and stride length. Pt able demonstrated improved gait speed initially, but decreases due to inattention.   Stairs            Wheelchair Mobility    Modified Rankin (Stroke Patients Only) Modified Rankin (Stroke Patients Only) Pre-Morbid Rankin Score: Slight disability Modified Rankin: Moderately severe disability     Balance Overall balance assessment: Needs assistance Sitting-balance support: No upper extremity supported;Feet supported Sitting balance-Leahy Scale: Good     Standing balance support: Single extremity supported;During functional activity Standing balance-Leahy Scale: Poor Standing balance comment: continues to requires UE support for balance.                            Cognition Arousal/Alertness: Awake/alert Behavior During Therapy: WFL for tasks assessed/performed Overall Cognitive Status: Within Functional Limits for tasks assessed Area of Impairment: Attention;Memory;Safety/judgement;Problem solving                   Current Attention Level: Sustained     Safety/Judgement: Decreased awareness of safety;Decreased awareness of deficits Awareness: Emergent Problem Solving: Slow processing;Decreased initiation;Difficulty sequencing;Requires verbal cues;Requires tactile cues General Comments: Pt gets distracted very easily and requires cues to stay on task      Exercises      General Comments General comments (skin integrity, edema, etc.): performed functional tasks in room prior to OOB to  chair, patient perseverating on wanting to eat and have coffee. Assisted patient with task sequencing with cues for visual scanning during tasks       Pertinent Vitals/Pain Pain Assessment: Faces Pain Score: 5  Faces Pain Scale: Hurts a little bit Pain Location: headache Pain Descriptors / Indicators: Headache Pain Intervention(s): Patient requesting pain meds-RN notified    Home Living                      Prior Function            PT Goals (current goals can now be found in the care plan section) Acute Rehab PT Goals Patient Stated Goal: to get back to walk and see PT Goal Formulation: With patient Time For Goal Achievement: 10/30/16 Potential to Achieve Goals: Good Progress towards PT goals: Progressing toward goals    Frequency    Min 4X/week      PT Plan Current plan remains appropriate    Co-evaluation              AM-PAC PT "6 Clicks" Daily Activity  Outcome Measure  Difficulty turning over in bed (including adjusting bedclothes, sheets and blankets)?: A Little Difficulty moving from lying on back to sitting on the side of the bed? : A Little Difficulty sitting down on and standing up from a chair with arms (e.g., wheelchair, bedside commode, etc,.)?: A Little Help needed moving to and from a bed to chair (including a wheelchair)?: A Little Help needed walking in hospital room?: A Little Help needed climbing 3-5 steps with a railing? : A Lot 6 Click Score: 17    End of Session Equipment Utilized During Treatment: Gait belt Activity Tolerance: Patient tolerated treatment well Patient left: in bed;with call bell/phone within reach;with bed alarm set Nurse Communication: Mobility status PT Visit Diagnosis: Unsteadiness on feet (R26.81);Difficulty in walking, not elsewhere classified (R26.2)     Time: 1310-1330 PT Time Calculation (min) (ACUTE ONLY): 20 min  Charges:  $Gait Training: 8-22 mins $Therapeutic Activity: 8-22 mins                    G Codes:       Corlis Leak, SPT  [phone]   Corlis Leak 10/20/2016, 1:40 PM

## 2016-10-20 NOTE — Clinical Social Work Placement (Signed)
   CLINICAL SOCIAL WORK PLACEMENT  NOTE  Date:  10/20/2016  Patient Details  Name: [name] MRN: [id number] Date of Birth: 05/14/1946  Clinical Social Work is seeking post-discharge placement for this patient at the Skilled  Nursing Facility level of care (*CSW will initial, date and re-position this form in  chart as items are completed):  Yes   Patient/family provided with [facility] Clinical Social Work Department's list of facilities offering this level of care within the geographic area requested by the patient (or if unable, by the patient's family).  Yes   Patient/family informed of their freedom to choose among providers that offer the needed level of care, that participate in Medicare, Medicaid or managed care program needed by the patient, have an available bed and are willing to accept the patient.  Yes   Patient/family informed of [facility]'s ownership interest in [facility] and [facility], as well as of the fact that they are under no obligation to receive care at these facilities.  PASRR submitted to EDS on       PASRR number received on       Existing PASRR number confirmed on       FL2 transmitted to all facilities in geographic area requested by pt/family on       FL2 transmitted to all facilities within larger geographic area on 10/19/16     Patient informed that his/her managed care company has contracts with or will negotiate with certain facilities, including the following:        Yes   Patient/family informed of bed offers received.  [facility]     Physician recommends and patient chooses bed at      [facility]  .  Patient to be transferred to facility by [facility]     Patient family notified on 10/20/16 of transfer.  Name of family member notified:  Coralie KeensLisa, [name]     PHYSICIAN       Additional Comment:     _______________________________________________ Baldemar LenisElizabeth M Trang Bouse, LCSW 10/20/2016, 2:45 PM

## 2016-10-20 NOTE — Discharge Summary (Signed)
Physician Discharge Summary  [name] WUJ:[id number] DOB: 06-Oct-1945 DOA: 10/15/2016  PCP: Rinaldo CloudHarwani, Mohan, MD  Admit date: 10/15/2016 Discharge date: 10/20/2016  Time: 65 mins Follow-up #1 Follow-up with cardiology 10/29/2016. #2 Follow-up with Dr. Sharyn LullHarwani 1 in 2 weeks. On follow-up patient will need a basic metabolic profile done to follow-up on electrolytes and renal function. Patient's blood pressure needs to be  reassessed. #3 Follow up with Dr Everlena CooperJaffe, neurology in 6 weeks. #4 follow-up with M.D. at skilled nursing facility.   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 Conchita ParisKakrakandy  [name] is a 71 y.o. female with history of hypertension COPD ongoing tobacco abuse presented to the ER with complaints of visual defects on the left side of the visual field. Patient had come to the ER 3 days prior to admission, with headache and at that time CT scan was negative. Patient's daughter noticed that patient has been having increasing difficulty seeing things on the left side with persistent headache and was brought to the ER again 1 day prior to admission. Patient denied any weakness of the extremities though on exam patient had left-sided weakness. Patient otherwise denied any difficulty walking and talking or swallowing.   ED Course: In the ER CT head showed evolving stroke with MRI confirming acute stroke involving the right posterior cerebral artery area with stroke also involving the left cerebellar area.  On-call neurologist Dr. Amada JupiterKirkpatrick was consulted and patient was being admitted for further stroke workup. On exam patient has left-sided visual field defects. Patient also has weakness of the left upper and lower extremity. Patient was not a candidate for TPA due to time of onset was beyond the window period.  Hospital Course:  #1 acute ischemic stroke/right posterior infarct Likely cardioembolic. No arrhythmias noted on telemetry. Patient admitted to stepdown for closer monitoring and then subsequently transferred to telemetry. Due to delay in presentation patient deemed not a TPA candidate. CT head on admission with evolving right occipital infarct must scattered small vessel disease. MRI with right PCA infarct. Small left cerebellum infarct. 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  -Carotid Dopplers with no significant ICA stenosis. 2-D echo pending. Patient was initially supposed to have TEE on 10/16/2016 however due to scheduling patient had TEE today Monday, 10/19/2016 per cardiology which was negative for any vegetations..  - LE dopplers negative for DVT or SVT - Patient also underwent loop recorder placement per electrophysiology. Hemoglobin A1c is 5.4. Fasting lipid panel with LDL of 73.  PT/OT/ST. Neurology recommended dual antiplatelet therapy with aspirin and Plavix for 3 months and then Plavix alone due to patient's intracranial stenosis for secondary stroke prevention. Continue statin. Cardiology and neurology followed the patient throughout the hospitalization. Patient educated on not driving secondary to left hemianopsia. Patient will follow-up with Dr. Everlena CooperJaffe neurology in 6 weeks. Patient was discharged to skilled nursing facility.  #2 hypertension Patient has been resumed on home regimen of antihypertensive medications. Follow.  #3 chronic kidney disease stage  II Stable.  #4 COPD/ongoing tobacco use Patient was maintained on Spiriva, Singulair,dulera, nicotine  patch.  #5 chronic back pain Pain management.  #6 anxiety Anxiety improved with ativan. Continued Lexapro.  #7 heart palpitations Patient had a 24-hour Holter monitor April 2018 with no arrhythmia noted. TEE was unremarkable. Loop has been placed. Per neurology if atrial fibrillation is confirmed on loop recorder patient will need to be placed on anticoagulation.  #8 hyperlipidemia LDL of 73.  Continue statin.  #9 tobacco abuse Tobacco cessation. Nicotine patch placed. Outpatient follow-up..     Procedures: CT head 10/15/2016 MRI MRA head 10/16/2016 Carotid Dopplers 10/16/2016 LE dopplers 10/18/2016 TEE per Dr.Kadakia 10/19/2016 Loop recorder placement  Consultations:  Neurology: Dr. Amada Jupiter 10/16/2016  Cardiology: Dr. Sharyn Lull 10/16/2016  Cardiology/EP: Dr. Graciela Husbands 10/19/2016  Discharge Exam: Vitals:   10/20/16 0519 10/20/16 0923  BP: (!) 158/89 125/79  Pulse: 92 77  Resp: 20 19  Temp: 97.8 F (36.6 C) 98.7 F (37.1 C)    General: NAD Cardiovascular: RRR Respiratory: CTAB  Discharge Instructions   Discharge Instructions    Ambulatory referral to Neurology    Complete by:  As directed    Pt will follow up with Dr. Everlena Cooper at [facility] in about 6 weeks. Thanks.   Diet - low sodium heart healthy    Complete by:  As directed    Increase activity slowly    Complete by:  As directed      Current Discharge Medication List    START taking these medications   Details  aspirin 325 MG tablet Take 1 tablet (325 mg total) by mouth daily.    clopidogrel (PLAVIX) 75 MG tablet Take 1 tablet (75 mg total) by mouth daily. Qty: 30 tablet, Refills: 1    LORazepam (ATIVAN) 0.5 MG tablet Take 1 tablet (0.5 mg total) by mouth 2 (two) times daily as needed for anxiety. Qty: 10 tablet, Refills: 0    nicotine (NICODERM CQ - DOSED IN MG/24 HOURS) 21 mg/24hr patch Place 1 patch (21 mg total) onto the skin daily. Qty: 28 patch, Refills: 1    polyethylene glycol  (MIRALAX / GLYCOLAX) packet Take 17 g by mouth daily. Qty: 14 each, Refills: 0    senna-docusate (SENOKOT-S) 8.6-50 MG tablet Take 1 tablet by mouth 2 (two) times daily.      CONTINUE these medications which have NOT CHANGED   Details  albuterol (PROVENTIL HFA;VENTOLIN HFA) 108 (90 Base) MCG/ACT inhaler Inhale 2 puffs into the lungs every 4 (four) hours as needed for wheezing or shortness of breath. Qty: 1 Inhaler, Refills: 2    diltiazem (DILACOR XR) 240 MG 24 hr capsule Take 240 mg by mouth daily.    escitalopram (LEXAPRO) 10 MG tablet Take 10 mg by mouth daily.    Fluticasone-Salmeterol (ADVAIR DISKUS) 250-50 MCG/DOSE AEPB Inhale 1 puff into the lungs 2 (two) times daily.      losartan (COZAAR) 100 MG tablet Take 1 tablet by mouth daily. Refills: 0    rosuvastatin (CRESTOR) 10 MG tablet Take 10 mg by mouth daily.     SINGULAIR 10 MG tablet Take 1 tablet by mouth daily. Refills: 0    thiamine (VITAMIN B-1) 100 MG tablet Take 100 mg by mouth daily. Refills: 0    tiotropium (SPIRIVA) 18 MCG inhalation capsule Place 18 mcg into inhaler and inhale daily.      Vitamin D, Ergocalciferol, (DRISDOL) 50000 units CAPS capsule Take 50,000 Units by mouth once a week. Refills: 0  STOP taking these medications     fluconazole (DIFLUCAN) 150 MG tablet      predniSONE (DELTASONE) 10 MG tablet        Allergies  Allergen Reactions  . Shrimp [Shellfish Allergy] Hives and Swelling   Follow-up Information    Drema Dallas, DO. Schedule an appointment as soon as possible for a visit in 6 week(s).   Specialty:  Neurology Contact information: [address]  [address] [place] [address] [phone]        [facility] Follow up on 10/29/2016.   Specialty:  Cardiology Why:  at [facility] for wound check  Contact information: [address], [address] [place] [address] [phone]       Rinaldo Cloud, MD. Schedule an  appointment as soon as possible for a visit in 3 week(s).   Specialty:  Cardiology Contact information: [address] [place] [address] [phone]        MD AT SNF Follow up.   Why:  F/U WITH MD AT SNF           The results of significant diagnostics from this hospitalization (including imaging, microbiology, ancillary and laboratory) are listed below for reference.    Significant Diagnostic Studies: Ct Head Wo Contrast  Result Date: 10/15/2016 CLINICAL DATA:  Acute onset of headache and dizziness, after fall. Initial encounter. EXAM: CT HEAD WITHOUT CONTRAST TECHNIQUE: Contiguous axial images were obtained from the base of the skull through the vertex without intravenous contrast. COMPARISON:  CT of the head performed 10/13/2016 FINDINGS: Brain: There is an evolving acute infarct at the right occipital lobe, with associated mass-effect. There is no evidence of hemorrhagic transformation. No definite mass is identified. Scattered periventricular and subcortical white matter change likely reflects small vessel ischemic microangiopathy. The brainstem and fourth ventricle are within normal limits. The basal ganglia are unremarkable in appearance. No midline shift is seen. Vascular: No hyperdense vessel or unexpected calcification. Skull: There is no evidence of fracture; visualized osseous structures are unremarkable in appearance. Sinuses/Orbits: The orbits are within normal limits. The paranasal sinuses and mastoid air cells are well-aerated. Other: No significant soft tissue abnormalities are seen. IMPRESSION: 1. Evolving acute infarct at the right occipital lobe, with associated mass effect. No evidence of hemorrhagic transformation. 2. Scattered small vessel ischemic microangiopathy. These results were called by telephone at the time of interpretation on 10/15/2016 at 10:59 pm to Dr. Gwyneth Sprout, who verbally acknowledged these results. Electronically Signed   By:  Roanna Raider M.D.   On: 10/15/2016 23:01   Ct Head Wo Contrast  Result Date: 10/13/2016 CLINICAL DATA:  Acute onset severe headache EXAM: CT HEAD WITHOUT CONTRAST TECHNIQUE: Contiguous axial images were obtained from the base of the skull through the vertex without intravenous contrast. COMPARISON:  03/19/2016, 08/06/2016 FINDINGS: Brain: Stable advanced chronic white matter microvascular ischemic changes throughout the cerebral hemispheres. No acute intracranial hemorrhage, mass lesion, definite new infarction, midline shift, herniation, hydrocephalus, or extra-axial fluid collection. Normal gray-white matter differentiation. No focal mass effect or edema. Cisterns are patent. No cerebellar abnormality. Vascular: No hyperdense vessel or unexpected calcification. Skull: Normal. Negative for fracture or focal lesion. Sinuses/Orbits: No acute finding. Other: None. IMPRESSION: Stable advanced chronic white matter microvascular ischemic changes. No interval change or acute process by noncontrast CT. Electronically Signed   By: Judie Petit.  Shick M.D.   On: 10/13/2016 09:20   Mr [name] Head Wo Contrast  Result Date:  10/16/2016 CLINICAL DATA:  Headaches and visual changes. EXAM: MRI HEAD WITHOUT CONTRAST MRA HEAD WITHOUT CONTRAST TECHNIQUE: Multiplanar, multiecho pulse sequences of the brain and surrounding structures were obtained without intravenous contrast. Angiographic images of the head were obtained using MRA technique without contrast. COMPARISON:  1. Head CT 10/15/2016 2. Brain MRI 08/06/2016 FINDINGS: MRI HEAD FINDINGS Brain: The midline structures are normal. There is diffusion restriction throughout the right posterior cerebral artery distribution. There is a small focus of diffusion restriction in the left cerebellum measuring approximately 10 mm. The diffusion restriction extends into the lateral aspect of the right thalamus. There is associated hyperintense T2 weighted signal at the above-described locations.  There is confluent hyperintense T2-weighted signal within the periventricular white matter, most often seen in the setting of chronic microvascular ischemia. No intraparenchymal hematoma or chronic microhemorrhage. Brain volume is normal for age without age-advanced or lobar predominant atrophy. The dura is normal and there is no extra-axial collection. Skull and upper cervical spine: The visualized skull base, calvarium, upper cervical spine and extracranial soft tissues are normal. Sinuses/Orbits: No fluid levels or advanced mucosal thickening. No mastoid effusion. Normal orbits. MRA HEAD FINDINGS The time-of-flight images are degraded by motion. Intracranial internal carotid arteries: Normal. Anterior cerebral arteries: Normal. Middle cerebral arteries: Multifocal atherosclerotic narrowing on the right. No advanced left MCA stenosis. Posterior communicating arteries: Absent bilaterally. Posterior cerebral arteries: There is loss of the normal flow related enhancement of the right posterior cerebral artery near the P2 P3 junction. Basilar artery: Normal. Vertebral arteries: Left dominant. Normal. Superior cerebellar arteries: Normal. Anterior inferior cerebellar arteries: Not clearly visualized. Posterior inferior cerebellar arteries: Normal. IMPRESSION: 1. Acute ischemia throughout the right posterior cerebral artery distribution without hemorrhage or significant mass effect. 2. Small focus of acute ischemia in the left cerebellum. Bilateral distribution of lesions may indicate a central embolic process. 3. Severe stenosis or occlusion of the right posterior cerebral artery near the junction of the P2 and P3 segments. 4. Otherwise motion-degraded MRA without large vessel occlusion. Electronically Signed   By: Deatra Robinson M.D.   On: 10/16/2016 04:03   Mr Brain Wo Contrast  Result Date: 10/16/2016 CLINICAL DATA:  Headaches and visual changes. EXAM: MRI HEAD WITHOUT CONTRAST MRA HEAD WITHOUT CONTRAST TECHNIQUE:  Multiplanar, multiecho pulse sequences of the brain and surrounding structures were obtained without intravenous contrast. Angiographic images of the head were obtained using MRA technique without contrast. COMPARISON:  1. Head CT 10/15/2016 2. Brain MRI 08/06/2016 FINDINGS: MRI HEAD FINDINGS Brain: The midline structures are normal. There is diffusion restriction throughout the right posterior cerebral artery distribution. There is a small focus of diffusion restriction in the left cerebellum measuring approximately 10 mm. The diffusion restriction extends into the lateral aspect of the right thalamus. There is associated hyperintense T2 weighted signal at the above-described locations. There is confluent hyperintense T2-weighted signal within the periventricular white matter, most often seen in the setting of chronic microvascular ischemia. No intraparenchymal hematoma or chronic microhemorrhage. Brain volume is normal for age without age-advanced or lobar predominant atrophy. The dura is normal and there is no extra-axial collection. Skull and upper cervical spine: The visualized skull base, calvarium, upper cervical spine and extracranial soft tissues are normal. Sinuses/Orbits: No fluid levels or advanced mucosal thickening. No mastoid effusion. Normal orbits. MRA HEAD FINDINGS The time-of-flight images are degraded by motion. Intracranial internal carotid arteries: Normal. Anterior cerebral arteries: Normal. Middle cerebral arteries: Multifocal atherosclerotic narrowing on the right. No advanced left MCA stenosis.  Posterior communicating arteries: Absent bilaterally. Posterior cerebral arteries: There is loss of the normal flow related enhancement of the right posterior cerebral artery near the P2 P3 junction. Basilar artery: Normal. Vertebral arteries: Left dominant. Normal. Superior cerebellar arteries: Normal. Anterior inferior cerebellar arteries: Not clearly visualized. Posterior inferior cerebellar  arteries: Normal. IMPRESSION: 1. Acute ischemia throughout the right posterior cerebral artery distribution without hemorrhage or significant mass effect. 2. Small focus of acute ischemia in the left cerebellum. Bilateral distribution of lesions may indicate a central embolic process. 3. Severe stenosis or occlusion of the right posterior cerebral artery near the junction of the P2 and P3 segments. 4. Otherwise motion-degraded MRA without large vessel occlusion. Electronically Signed   By: Deatra Robinson M.D.   On: 10/16/2016 04:03    Microbiology: Recent Results (from the past 240 hour(s))  MRSA PCR Screening     Status: Abnormal   Collection Time: 10/16/16  4:02 AM  Result Value Ref Range Status   MRSA by PCR POSITIVE (A) NEGATIVE Final    Comment:        The GeneXpert MRSA Assay (FDA approved for NASAL specimens only), is one component of a comprehensive MRSA colonization surveillance program. It is not intended to diagnose MRSA infection nor to guide or monitor treatment for MRSA infections. RESULT CALLED TO, READ BACK BY AND VERIFIED WITH: Adin Hector [id number] 06.01.2018 N. MORRIS      Labs: Basic Metabolic Panel:  Recent Labs Lab 10/15/16 2154 10/15/16 2214 10/16/16 0543 10/17/16 0256 10/18/16 0825 10/19/16 0547  NA 141 143 142 137 142 142  K 3.8 3.8 4.1 3.6 3.7 3.8  CL 111 110 112* 109 115* 112*  CO2 20*  --  20* 21* 20* 21*  GLUCOSE 133* 134* 88 72 98 95  BUN 15 18 17 13 8 7   CREATININE 1.66* 1.70* 1.58* 1.29* 1.00 1.04*  CALCIUM 10.6*  --  10.2 9.3 10.0 9.9   Liver Function Tests:  Recent Labs Lab 10/15/16 2154 10/16/16 0543  AST 21 20  ALT 11* 11*  ALKPHOS 58 52  BILITOT 0.6 0.7  PROT 7.4 6.7  ALBUMIN 3.6 3.3*   No results for input(s): LIPASE, AMYLASE in the last 168 hours. No results for input(s): AMMONIA in the last 168 hours. CBC:  Recent Labs Lab 10/15/16 2154 10/15/16 2214 10/17/16 0256 10/18/16 0825 10/19/16 0547  WBC 10.7*  --  9.0 7.3  7.2  NEUTROABS 7.3  --   --  4.9 4.6  HGB 14.0 14.6 11.9* 12.9 12.5  HCT 42.7 43.0 37.7 40.5 38.8  MCV 93.2  --  93.5 94.4 92.8  PLT 211  --  178 190 167   Cardiac Enzymes: No results for input(s): CKTOTAL, CKMB, CKMBINDEX, TROPONINI in the last 168 hours. BNP: BNP (last 3 results)  Recent Labs  08/30/16 2029  BNP 13.9    ProBNP (last 3 results) No results for input(s): PROBNP in the last 8760 hours.  CBG: No results for input(s): GLUCAP in the last 168 hours.     SignedRamiro Harvest MD.  [facility] 10/20/2016, 12:11 PM

## 2016-10-21 ENCOUNTER — Other Ambulatory Visit: Payer: Medicare Other

## 2016-10-21 ENCOUNTER — Observation Stay (HOSPITAL_COMMUNITY)
Admission: EM | Admit: 2016-10-21 | Discharge: 2016-10-23 | Disposition: A | Discharge status: Skilled Nursing Facility | Payer: Medicare Other | Attending: Family Medicine | Admitting: Family Medicine

## 2016-10-21 ENCOUNTER — Encounter (HOSPITAL_COMMUNITY): Payer: Self-pay | Admitting: Family Medicine

## 2016-10-21 ENCOUNTER — Emergency Department (HOSPITAL_COMMUNITY): Payer: Medicare Other

## 2016-10-21 ENCOUNTER — Telehealth: Payer: Self-pay | Admitting: *Deleted

## 2016-10-21 DIAGNOSIS — R51 Headache: Secondary | ICD-10-CM | POA: Insufficient documentation

## 2016-10-21 DIAGNOSIS — J45909 Unspecified asthma, uncomplicated: Secondary | ICD-10-CM | POA: Diagnosis not present

## 2016-10-21 DIAGNOSIS — Z8249 Family history of ischemic heart disease and other diseases of the circulatory system: Secondary | ICD-10-CM | POA: Insufficient documentation

## 2016-10-21 DIAGNOSIS — I10 Essential (primary) hypertension: Secondary | ICD-10-CM | POA: Diagnosis present

## 2016-10-21 DIAGNOSIS — Z981 Arthrodesis status: Secondary | ICD-10-CM | POA: Insufficient documentation

## 2016-10-21 DIAGNOSIS — Z8673 Personal history of transient ischemic attack (TIA), and cerebral infarction without residual deficits: Secondary | ICD-10-CM | POA: Diagnosis not present

## 2016-10-21 DIAGNOSIS — Z79899 Other long term (current) drug therapy: Secondary | ICD-10-CM | POA: Insufficient documentation

## 2016-10-21 DIAGNOSIS — M48061 Spinal stenosis, lumbar region without neurogenic claudication: Secondary | ICD-10-CM | POA: Insufficient documentation

## 2016-10-21 DIAGNOSIS — G9340 Encephalopathy, unspecified: Secondary | ICD-10-CM | POA: Diagnosis present

## 2016-10-21 DIAGNOSIS — M1990 Unspecified osteoarthritis, unspecified site: Secondary | ICD-10-CM | POA: Diagnosis not present

## 2016-10-21 DIAGNOSIS — F17210 Nicotine dependence, cigarettes, uncomplicated: Secondary | ICD-10-CM | POA: Insufficient documentation

## 2016-10-21 DIAGNOSIS — Z818 Family history of other mental and behavioral disorders: Secondary | ICD-10-CM | POA: Insufficient documentation

## 2016-10-21 DIAGNOSIS — R410 Disorientation, unspecified: Secondary | ICD-10-CM

## 2016-10-21 DIAGNOSIS — I7389 Other specified peripheral vascular diseases: Secondary | ICD-10-CM | POA: Diagnosis not present

## 2016-10-21 DIAGNOSIS — J449 Chronic obstructive pulmonary disease, unspecified: Secondary | ICD-10-CM | POA: Diagnosis present

## 2016-10-21 DIAGNOSIS — Z7902 Long term (current) use of antithrombotics/antiplatelets: Secondary | ICD-10-CM | POA: Insufficient documentation

## 2016-10-21 DIAGNOSIS — E785 Hyperlipidemia, unspecified: Secondary | ICD-10-CM | POA: Diagnosis not present

## 2016-10-21 DIAGNOSIS — I129 Hypertensive chronic kidney disease with stage 1 through stage 4 chronic kidney disease, or unspecified chronic kidney disease: Secondary | ICD-10-CM | POA: Diagnosis not present

## 2016-10-21 DIAGNOSIS — Z91013 Allergy to seafood: Secondary | ICD-10-CM | POA: Insufficient documentation

## 2016-10-21 DIAGNOSIS — Z8261 Family history of arthritis: Secondary | ICD-10-CM | POA: Insufficient documentation

## 2016-10-21 DIAGNOSIS — Z7982 Long term (current) use of aspirin: Secondary | ICD-10-CM | POA: Insufficient documentation

## 2016-10-21 DIAGNOSIS — N182 Chronic kidney disease, stage 2 (mild): Secondary | ICD-10-CM | POA: Insufficient documentation

## 2016-10-21 DIAGNOSIS — G9349 Other encephalopathy: Principal | ICD-10-CM | POA: Insufficient documentation

## 2016-10-21 DIAGNOSIS — F17200 Nicotine dependence, unspecified, uncomplicated: Secondary | ICD-10-CM | POA: Diagnosis present

## 2016-10-21 LAB — URINALYSIS, ROUTINE W REFLEX MICROSCOPIC
BACTERIA UA: NONE SEEN
Bilirubin Urine: NEGATIVE
GLUCOSE, UA: NEGATIVE mg/dL
Ketones, ur: 5 mg/dL — AB
LEUKOCYTES UA: NEGATIVE
Nitrite: NEGATIVE
PH: 5 (ref 5.0–8.0)
PROTEIN: 30 mg/dL — AB
SPECIFIC GRAVITY, URINE: 1.019 (ref 1.005–1.030)

## 2016-10-21 LAB — CBC
HCT: 42.1 % (ref 36.0–46.0)
Hemoglobin: 14 g/dL (ref 12.0–15.0)
MCH: 30.7 pg (ref 26.0–34.0)
MCHC: 33.3 g/dL (ref 30.0–36.0)
MCV: 92.3 fL (ref 78.0–100.0)
Platelets: 213 10*3/uL (ref 150–400)
RBC: 4.56 MIL/uL (ref 3.87–5.11)
RDW: 14.5 % (ref 11.5–15.5)
WBC: 11.1 10*3/uL — ABNORMAL HIGH (ref 4.0–10.5)

## 2016-10-21 LAB — COMPREHENSIVE METABOLIC PANEL
ALT: 12 U/L — AB (ref 14–54)
AST: 23 U/L (ref 15–41)
Albumin: 4.4 g/dL (ref 3.5–5.0)
Alkaline Phosphatase: 62 U/L (ref 38–126)
Anion gap: 10 (ref 5–15)
BILIRUBIN TOTAL: 0.7 mg/dL (ref 0.3–1.2)
BUN: 17 mg/dL (ref 6–20)
CO2: 23 mmol/L (ref 22–32)
CREATININE: 1.5 mg/dL — AB (ref 0.44–1.00)
Calcium: 10.7 mg/dL — ABNORMAL HIGH (ref 8.9–10.3)
Chloride: 108 mmol/L (ref 101–111)
GFR, EST AFRICAN AMERICAN: 40 mL/min — AB (ref >60)
GFR, EST NON AFRICAN AMERICAN: 34 mL/min — AB (ref >60)
Glucose, Bld: 103 mg/dL — ABNORMAL HIGH (ref 65–99)
Potassium: 3.9 mmol/L (ref 3.5–5.1)
Sodium: 141 mmol/L (ref 135–145)
TOTAL PROTEIN: 8 g/dL (ref 6.5–8.1)

## 2016-10-21 LAB — RAPID URINE DRUG SCREEN, HOSP PERFORMED
Amphetamines: NOT DETECTED
Barbiturates: NOT DETECTED
Benzodiazepines: POSITIVE — AB
Cocaine: NOT DETECTED
OPIATES: NOT DETECTED
Tetrahydrocannabinol: NOT DETECTED

## 2016-10-21 LAB — SALICYLATE LEVEL

## 2016-10-21 LAB — PROTIME-INR
INR: 1.01
PROTHROMBIN TIME: 13.3 s (ref 11.4–15.2)

## 2016-10-21 LAB — ACETAMINOPHEN LEVEL: Acetaminophen (Tylenol), Serum: <10 ug/mL — ABNORMAL LOW (ref 10–30)

## 2016-10-21 LAB — APTT: APTT: 34 s (ref 24–36)

## 2016-10-21 LAB — ETHANOL

## 2016-10-21 MED ORDER — ACETAMINOPHEN 325 MG PO TABS
650.0000 mg | ORAL_TABLET | Freq: Once | ORAL | Status: AC
  Administered 2016-10-22: 650 mg via ORAL
  Filled 2016-10-21: qty 2

## 2016-10-21 MED ORDER — SODIUM CHLORIDE 0.9 % IV BOLUS (SEPSIS)
1000.0000 mL | Freq: Once | INTRAVENOUS | Status: AC
  Administered 2016-10-22: 1000 mL via INTRAVENOUS

## 2016-10-21 NOTE — ED Notes (Signed)
Pt stated "I was @ [facility] 2 days ago and [facility] because [facility]'d had [facility].  I got scared today.  I couldn't find my way home."

## 2016-10-21 NOTE — ED Notes (Signed)
Bed: [facility] Expected date:  Expected time:  Means of arrival:  Comments: 

## 2016-10-21 NOTE — Telephone Encounter (Signed)
[facility]  We have cancelled that test due to pt had it at hospital 5 days ago  Per Dr Everlena CooperJaffe  Rayna Sextonalled Kathy at [facility] also and informed her

## 2016-10-21 NOTE — ED Notes (Signed)
Bed: [facility]2 Expected date:  Expected time:  Means of arrival:  Comments: 

## 2016-10-21 NOTE — ED Provider Notes (Signed)
[facility]-EMERGENCY DEPT Provider Note   CSN: [id number] Arrival date & time: 10/21/16  1751     History   Chief Complaint Chief Complaint  Patient presents with  . Psychiatric Evaluation    HPI Mackenzie Haileyoni A [place] is a 71 y.o. female.  HPI   Level V caveat due to altered mental status.  [name] is a 71 y.o. female, with a history of CVA, HTN, asthma, presenting to the ED Under IVC. In short, patient was found wandering outside of her rehabilitation facility barefoot and disoriented. Family took out IVC paperwork citing patient's abnormal behavior.  Patient states, "I've had the worst headache for the last 2-3 days. It's so bad I didn't know what to do." Patient states that her headache got so bad that she thought walking outside would help. She remembers walking outside and then she became disoriented. States, "I just didn't know where my house was and it really scared me." Patient cannot rate her pain, but states her pain is bilateral frontal and around her eyes.  Denies alcohol or illicit drug use. Denies SI/HI. Denies A/V hallucinations.  Patient had an ischemic stroke diagnosed on 6/1 affecting the right posterior cerebral artery distribution.  Denies fever/chills, nausea/vomiting, new vision deficits, numbness, weakness, or any other complaints.    Past Medical History:  Diagnosis Date  . ALLERGIC RHINITIS   . Arthritis   . ASTHMA, UNSPECIFIED, UNSPECIFIED STATUS   . DYSLIPIDEMIA   . Headache(784.0)   . Hyperlipidemia 02/20/2009   Qualifier: Diagnosis of  By: Felicity CoyerLeschber MD, Raenette RoverValerie A   . HYPERTENSION   . Hypertension   . Mild renal insufficiency   . PNA (pneumonia)   . Shortness of breath dyspnea   . SPINAL STENOSIS, LUMBAR    MRI 12/2005; surg 2004  . TOBACCO ABUSE     Patient Active Problem List   Diagnosis Date Noted  . 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 Stroke (HCC) 10/16/2016  . Elevated troponin   . Altered  mental status 03/19/2016  . Neuropathic pain   . Anxiety state   . Sleep disturbance   . Post-operative pain   . Spondylolisthesis of lumbar region 01/02/2016  . Surgery, elective   . Chronic obstructive pulmonary disease (HCC)   . Respiratory failure with hypercapnia (HCC)   . Chronic back pain   . Postoperative anemia due to acute blood loss   . Tachycardia   . Leukocytosis   . Thrombocytopenia (HCC)   . Lumbosacral spondylosis with radiculopathy 12/30/2015  . Acute encephalopathy   . AKI (acute kidney injury) (HCC)   . CKD (chronic kidney disease), stage II   . HEADACHE 08/16/2009  . Hyperlipidemia 02/20/2009  . TOBACCO ABUSE 02/20/2009  . Essential hypertension 02/20/2009  . ALLERGIC RHINITIS 02/20/2009  . Asthma 02/20/2009  . SHOULDER PAIN, RIGHT, CHRONIC 02/20/2009  . HIP PAIN, RIGHT, CHRONIC 02/20/2009  . SPINAL STENOSIS, CERVICAL 02/20/2009  . SPINAL STENOSIS, LUMBAR 02/20/2009  . BACK PAIN, LUMBAR 02/20/2009    Past Surgical History:  Procedure Laterality Date  . ANTERIOR LAT LUMBAR FUSION Right 12/30/2015   Procedure: Lumbar two-three, Lumbar three-four Anterior Lateral Lumbar Interbody Fusion with Anterior column realignment at Lumbar three-four;  Surgeon: Loura HaltBenjamin Jared Ditty, MD;  Location: [facility];  Service: Neurosurgery;  Laterality: Right;  . Closure of bronchopleural fistula, right lower lobe.    [name]. COLONOSCOPY    . Decompressive Laminectomy     Fusion L4-5, L5-S1 (Cram-2004)  .  Decortication of right lower lobe    . LOOP RECORDER INSERTION N/A 10/19/2016   Procedure: Loop Recorder Insertion;  Surgeon: Duke Salvia, MD;  Location: [facility] INVASIVE CV LAB;  Service: Cardiovascular;  Laterality: N/A;  . Placement of wound On-Q analgesia irrigation system.    [name] POSTERIOR LUMBAR FUSION 4 LEVEL N/A 12/30/2015   Procedure: Lumbar two -Sacral one Dorsal Internal Fixation and Fusion, [name] osteotomies Lumbar two-three, Lumbar three-four;  Surgeon: [name], MD;  Location: [facility];  Service: Neurosurgery;  Laterality: N/A;  . Right hand  1995  . Right knee  1990  . Right video-assisted thoracoscopic surgery, drainage of empyema.  02/12/2011  . TEE WITHOUT CARDIOVERSION N/A 10/19/2016   Procedure: TRANSESOPHAGEAL ECHOCARDIOGRAM (TEE);  Surgeon: [name], MD;  Location: [facility] ENDOSCOPY;  Service: Cardiovascular;  Laterality: N/A;    OB History    Gravida Para Term Preterm AB Living   5 4 4   1 4    SAB TAB Ectopic Multiple Live Births     1             Home Medications    Prior to Admission medications   Medication Sig Start Date End Date Taking? Authorizing Provider  albuterol (PROVENTIL HFA;VENTOLIN HFA) 108 (90 Base) MCG/ACT inhaler Inhale 2 puffs into the lungs every 4 (four) hours as needed for wheezing or shortness of breath. 08/30/16  Yes Lavera Guise, MD  diltiazem (DILACOR XR) 240 MG 24 hr capsule Take 240 mg by mouth daily.   Yes [provider]  diphenhydrAMINE (BENADRYL) 25 MG tablet Take 25 mg by mouth every 6 (six) hours as needed for itching.   Yes [provider]  escitalopram (LEXAPRO) 10 MG tablet Take 10 mg by mouth daily.   Yes [provider]  Fluticasone-Salmeterol (ADVAIR DISKUS) 250-50 MCG/DOSE AEPB Inhale 1 puff into the lungs 2 (two) times daily.     Yes [provider]  losartan (COZAAR) 100 MG tablet Take 1 tablet by mouth daily. 07/27/16  Yes [provider]  rosuvastatin (CRESTOR) 10 MG tablet Take 10 mg by mouth daily.    Yes [provider]  SINGULAIR 10 MG tablet Take 1 tablet by mouth daily. 07/02/16  Yes [provider]  thiamine (VITAMIN B-1) 100 MG tablet Take 100 mg by mouth daily. 04/30/16  Yes [provider]  tiotropium (SPIRIVA) 18 MCG inhalation capsule Place 18 mcg into inhaler and inhale daily.     Yes [provider]  Vitamin D, Ergocalciferol, (DRISDOL) 50000 units CAPS capsule Take 50,000 Units by  mouth once a week. 07/14/16  Yes [provider]  aspirin 325 MG tablet Take 1 tablet (325 mg total) by mouth daily. 10/21/16   Rodolph Bong, MD  clopidogrel (PLAVIX) 75 MG tablet Take 1 tablet (75 mg total) by mouth daily. 10/21/16   Rodolph Bong, MD  LORazepam (ATIVAN) 0.5 MG tablet Take 1 tablet (0.5 mg total) by mouth 2 (two) times daily as needed for anxiety. 10/20/16   Rodolph Bong, MD  nicotine (NICODERM CQ - DOSED IN MG/24 HOURS) 21 mg/24hr patch Place 1 patch (21 mg total) onto the skin daily. 10/21/16   Rodolph Bong, MD  polyethylene glycol [facility] / [name]) packet Take 17 g by mouth daily. 10/21/16   Rodolph Bong, MD  senna-docusate (SENOKOT-S) 8.6-50 MG tablet Take 1 tablet by mouth 2 (two) times daily. 10/20/16   Rodolph Bong,  MD    Family History Family History  Problem Relation Age of Onset  . Mental illness Mother   . Hypertension Mother   . Arthritis Other   . Hypertension Other     Social History Social History  Substance Use Topics  . Smoking status: Current Every Day Smoker    Packs/day: 0.25    Years: 40.00    Types: Cigarettes  . 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 Alcohol use No     Allergies   Shrimp [shellfish allergy]   Review of Systems Review of Systems  Unable to perform ROS: Mental status change  Constitutional: Negative for chills and fever.  Respiratory: Negative for shortness of breath.   Cardiovascular: Negative for chest pain.  Gastrointestinal: Negative for abdominal pain, diarrhea, nausea and vomiting.  Musculoskeletal: Negative for neck pain.  Neurological: Positive for headaches. Negative for dizziness, syncope, weakness and numbness.  Psychiatric/Behavioral: Positive for confusion.       IVC for abnormal behavior     Physical Exam Updated Vital Signs BP (!) 131/95 (BP Location: Left Arm)   Pulse (!) 117   Temp 98  F (36.7 C) (Oral)   Resp 18   Ht 5\' 5"  (1.651 m)   Wt 59 kg (130 lb)   SpO2 95%   BMI 21.63 kg/m   Physical Exam  Constitutional: She appears well-developed and well-nourished. No distress.  HENT:  Head: Normocephalic and atraumatic.  Eyes: Conjunctivae and EOM are normal. Pupils are equal, round, and reactive to light.  Neck: Normal range of motion. Neck supple.  Cardiovascular: Normal rate, regular rhythm, normal heart sounds and intact distal pulses.   Pulmonary/Chest: Effort normal and breath sounds normal. No respiratory distress.  Abdominal: Soft. There is no tenderness. There is no guarding.  Musculoskeletal: She exhibits no edema.  Normal motor function intact in all extremities and spine. No midline spinal tenderness.   Lymphadenopathy:    She has no cervical adenopathy.  Neurological: She is alert.  Oriented to self and year. No sensory deficits. Strength 5/5 in all extremities. No upright ataxia. Coordination intact including heel to shin and finger to nose. Cranial nerves III-XII grossly intact. No facial droop.   Skin: Skin is warm and dry. She is not diaphoretic.  Psychiatric: She has a normal mood and affect. Her behavior is normal.  Nursing note and vitals reviewed.    ED Treatments / Results  Labs (all labs ordered are listed, but only abnormal results are displayed) Labs Reviewed  COMPREHENSIVE METABOLIC PANEL - Abnormal; Notable for the following:       Result Value   Glucose, Bld 103 (*)    Creatinine, Ser 1.50 (*)    Calcium 10.7 (*)    ALT 12 (*)    GFR calc non Af Amer 34 (*)    GFR calc Af Amer 40 (*)    All other components within normal limits  ACETAMINOPHEN LEVEL - Abnormal; Notable for the following:    Acetaminophen (Tylenol), Serum <10 (*)    All other components within normal limits  CBC - Abnormal; Notable for the following:    WBC 11.1 (*)    All other components within normal limits  RAPID URINE DRUG SCREEN, HOSP PERFORMED -  Abnormal; Notable for the following:    Benzodiazepines POSITIVE (*)    All other components within normal limits  URINALYSIS, ROUTINE W REFLEX  MICROSCOPIC - Abnormal; Notable for the following:    APPearance HAZY (*)    Hgb urine dipstick SMALL (*)    Ketones, ur 5 (*)    Protein, ur 30 (*)    Squamous Epithelial / LPF 0-5 (*)    All other components within normal limits  ETHANOL  SALICYLATE LEVEL  PROTIME-INR  APTT    BUN  Date Value Ref Range Status  10/21/2016 17 6 - 20 mg/dL Final  62/13/0865 7 6 - 20 mg/dL Final  78/46/9629 8 6 - 20 mg/dL Final  52/84/1324 13 6 - 20 mg/dL Final  40/02/2724 24 8 - 27 mg/dL Final   Creatinine, Ser  Date Value Ref Range Status  10/21/2016 1.50 (H) 0.44 - 1.00 mg/dL Final  36/64/4034 7.42 (H) 0.44 - 1.00 mg/dL Final  59/56/3875 6.43 0.44 - 1.00 mg/dL Final  32/95/1884 1.66 (H) 0.44 - 1.00 mg/dL Final     EKG  EKG Interpretation None       Radiology Ct Head Wo Contrast  Result Date: 10/21/2016 CLINICAL DATA:  Patient is IVC and was transported via [employer]. Family is concerned about her well being, claims she is hearing voices, wandering outside, being aggressive toward family. EXAM: CT HEAD WITHOUT CONTRAST TECHNIQUE: Contiguous axial images were obtained from the base of the skull through the vertex without intravenous contrast. COMPARISON:  10/15/2016 FINDINGS: Brain: Subacute right occipital infarct as previously described. Stable hypo attenuation and right occipital white matter. No acute hemorrhage. Ventricles and sulci are normal in size and symmetry. No mass, mass effect, or midline shift. Vascular: No hyperdense vessel. Atherosclerotic and physiologic intracranial calcifications. Skull: Normal. Negative for fracture or focal lesion. Sinuses/Orbits: No acute finding. Other: None IMPRESSION: 1. Negative for bleed or other acute intracranial process. 2. Stable appearance of subacute right PCA distribution infarct.  Electronically Signed   By: Corlis Leak M.D.   On: 10/21/2016 21:39    Procedures Procedures (including critical care time)  Medications Ordered in ED Medications  sodium chloride 0.9 % bolus 1,000 mL (not administered)  acetaminophen (TYLENOL) tablet 650 mg (not administered)     Initial Impression / Assessment and Plan / ED Course  I have reviewed the triage vital signs and the nursing notes.  Pertinent labs & imaging results that were available during my care of the patient were reviewed by me and considered in my medical decision making (see chart for details).  Clinical Course as of Oct 21 2333  Wed Oct 21, 2016  2303 Spoke with Dr. Toniann Fail, hospitalist. Requests a neuro consult. States he will be following the patient. Inquire about additional imaging. Call back if she needs transfer to [facility].  [SJ]  2327 Spoke with Dr. Amada Jupiter, neurologist. Recommends transfer and admission to [facility]. Will need MRI. States it could be delirium secondary to her recent stroke.  [SJ]  2333 Notified Dr. Toniann Fail of Neurology's recommendations. States Dr. Maryfrances Bunnell will be the accepting hospitalist at [facility].  [SJ]    Clinical Course User Index [SJ] Anselm Pancoast, PA-C    Patient presents under IVC for reported new onset confusion and behavior changes. As of neurology's assessment on June 4, patient was fully alert and oriented with her only deficit being left sided hemianopsia. Patient to be transferred to [facility] for admission, MRI, and further evaluation.    Findings and plan of care discussed with Jacalyn Lefevre, MD. Dr. Particia Nearing personally evaluated and examined this patient.  Vitals:  10/21/16 1828 10/21/16 1856 10/21/16 2223 10/21/16 2224  BP: (!) 131/95  121/82   Pulse: (!) 117  90   Resp: 18  18   Temp: 98 F (36.7 C)  98.6 F (37 C) 97.8 F (36.6 C)  TempSrc: Oral  Oral Oral  SpO2: 95%  100%   Weight:  59 kg (130 lb)    Height:  5\' 5"  (1.651 m)       Final  Clinical Impressions(s) / ED Diagnoses   Final diagnoses:  Confusion    New Prescriptions New Prescriptions   No medications on file     Concepcion Living 10/21/16 2334    Anselm Pancoast, PA-C 10/21/16 2335    Jacalyn Lefevre, MD 10/21/16 [phone]

## 2016-10-21 NOTE — ED Triage Notes (Signed)
[place] is IVC and [place] [employer]. Family is concerned about her well being, claims she is hearing voices, wandering outside, being aggressive toward family.

## 2016-10-21 NOTE — ED Notes (Signed)
Patient transported to CT 

## 2016-10-21 NOTE — ED Notes (Signed)
Bed: [id number] Expected date:  Expected time:  Means of arrival:  Comments: Hold for 26

## 2016-10-22 ENCOUNTER — Encounter (HOSPITAL_COMMUNITY): Payer: Self-pay | Admitting: Internal Medicine

## 2016-10-22 ENCOUNTER — Encounter: Payer: Medicare Other | Admitting: Psychology

## 2016-10-22 ENCOUNTER — Observation Stay (HOSPITAL_COMMUNITY): Payer: Medicare Other

## 2016-10-22 DIAGNOSIS — G9340 Encephalopathy, unspecified: Secondary | ICD-10-CM

## 2016-10-22 DIAGNOSIS — G9349 Other encephalopathy: Secondary | ICD-10-CM | POA: Diagnosis not present

## 2016-10-22 DIAGNOSIS — F17200 Nicotine dependence, unspecified, uncomplicated: Secondary | ICD-10-CM

## 2016-10-22 DIAGNOSIS — I10 Essential (primary) hypertension: Secondary | ICD-10-CM

## 2016-10-22 DIAGNOSIS — J449 Chronic obstructive pulmonary disease, unspecified: Secondary | ICD-10-CM

## 2016-10-22 LAB — AMMONIA: AMMONIA: 19 umol/L (ref 9–35)

## 2016-10-22 MED ORDER — LOSARTAN POTASSIUM 50 MG PO TABS
100.0000 mg | ORAL_TABLET | Freq: Every day | ORAL | Status: DC
  Administered 2016-10-22 – 2016-10-23 (×2): 100 mg via ORAL
  Filled 2016-10-22 (×2): qty 2

## 2016-10-22 MED ORDER — SENNOSIDES-DOCUSATE SODIUM 8.6-50 MG PO TABS
1.0000 | ORAL_TABLET | Freq: Two times a day (BID) | ORAL | Status: DC
  Administered 2016-10-22 – 2016-10-23 (×2): 1 via ORAL
  Filled 2016-10-22 (×2): qty 1

## 2016-10-22 MED ORDER — ACETAMINOPHEN 325 MG PO TABS
650.0000 mg | ORAL_TABLET | ORAL | Status: DC | PRN
  Administered 2016-10-23: 650 mg via ORAL
  Filled 2016-10-22: qty 2

## 2016-10-22 MED ORDER — CLOPIDOGREL BISULFATE 75 MG PO TABS
75.0000 mg | ORAL_TABLET | Freq: Every day | ORAL | Status: DC
  Administered 2016-10-22 – 2016-10-23 (×2): 75 mg via ORAL
  Filled 2016-10-22 (×2): qty 1

## 2016-10-22 MED ORDER — ENOXAPARIN SODIUM 40 MG/0.4ML ~~LOC~~ SOLN
40.0000 mg | SUBCUTANEOUS | Status: DC
  Administered 2016-10-23: 40 mg via SUBCUTANEOUS
  Filled 2016-10-22 (×2): qty 0.4

## 2016-10-22 MED ORDER — MONTELUKAST SODIUM 10 MG PO TABS
10.0000 mg | ORAL_TABLET | Freq: Every day | ORAL | Status: DC
  Administered 2016-10-22 – 2016-10-23 (×2): 10 mg via ORAL
  Filled 2016-10-22 (×2): qty 1

## 2016-10-22 MED ORDER — MOMETASONE FURO-FORMOTEROL FUM 200-5 MCG/ACT IN AERO
2.0000 | INHALATION_SPRAY | Freq: Two times a day (BID) | RESPIRATORY_TRACT | Status: DC
  Administered 2016-10-22 – 2016-10-23 (×2): 2 via RESPIRATORY_TRACT
  Filled 2016-10-22: qty 8.8

## 2016-10-22 MED ORDER — DILTIAZEM HCL ER COATED BEADS 240 MG PO CP24
240.0000 mg | ORAL_CAPSULE | Freq: Every day | ORAL | Status: DC
  Administered 2016-10-22 – 2016-10-23 (×2): 240 mg via ORAL
  Filled 2016-10-22 (×2): qty 1

## 2016-10-22 MED ORDER — QUETIAPINE FUMARATE 25 MG PO TABS
25.0000 mg | ORAL_TABLET | Freq: Every day | ORAL | Status: DC
  Administered 2016-10-22: 25 mg via ORAL
  Filled 2016-10-22: qty 1

## 2016-10-22 MED ORDER — LORAZEPAM 0.5 MG PO TABS
0.5000 mg | ORAL_TABLET | Freq: Two times a day (BID) | ORAL | Status: DC | PRN
  Administered 2016-10-22: 0.5 mg via ORAL
  Filled 2016-10-22: qty 1

## 2016-10-22 MED ORDER — POLYETHYLENE GLYCOL 3350 17 G PO PACK
17.0000 g | PACK | Freq: Every day | ORAL | Status: DC
  Administered 2016-10-22 – 2016-10-23 (×2): 17 g via ORAL
  Filled 2016-10-22 (×2): qty 1

## 2016-10-22 MED ORDER — ACETAMINOPHEN 160 MG/5ML PO SOLN: 650.0000 mg | ORAL | Status: DC | PRN

## 2016-10-22 MED ORDER — VITAMIN B-1 100 MG PO TABS
100.0000 mg | ORAL_TABLET | Freq: Every day | ORAL | Status: DC
Start: 2016-10-22 — End: 2016-10-23
  Administered 2016-10-22 – 2016-10-23 (×2): 100 mg via ORAL
  Filled 2016-10-22 (×2): qty 1

## 2016-10-22 MED ORDER — ASPIRIN 325 MG PO TABS
325.0000 mg | ORAL_TABLET | Freq: Every day | ORAL | Status: DC
Start: 2016-10-22 — End: 2016-10-23
  Administered 2016-10-22 – 2016-10-23 (×2): 325 mg via ORAL
  Filled 2016-10-22 (×2): qty 1

## 2016-10-22 MED ORDER — HALOPERIDOL LACTATE 5 MG/ML IJ SOLN
5.0000 mg | Freq: Four times a day (QID) | INTRAMUSCULAR | Status: DC | PRN
  Administered 2016-10-22: 5 mg via INTRAVENOUS
  Filled 2016-10-22: qty 1

## 2016-10-22 MED ORDER — ESCITALOPRAM OXALATE 10 MG PO TABS
10.0000 mg | ORAL_TABLET | Freq: Every day | ORAL | Status: DC
  Administered 2016-10-22 – 2016-10-23 (×2): 10 mg via ORAL
  Filled 2016-10-22 (×2): qty 1

## 2016-10-22 MED ORDER — STROKE: EARLY STAGES OF RECOVERY BOOK
Freq: Once | Status: AC
  Administered 2016-10-22: 01:00:00
  Filled 2016-10-22 (×2): qty 1

## 2016-10-22 MED ORDER — ACETAMINOPHEN 650 MG RE SUPP: 650.0000 mg | RECTAL | Status: DC | PRN

## 2016-10-22 MED ORDER — SODIUM CHLORIDE 0.9 % IV SOLN: INTRAVENOUS | Status: DC

## 2016-10-22 MED ORDER — ALBUTEROL SULFATE (2.5 MG/3ML) 0.083% IN NEBU
3.0000 mL | INHALATION_SOLUTION | RESPIRATORY_TRACT | Status: DC | PRN
  Administered 2016-10-23 (×2): 3 mL via RESPIRATORY_TRACT
  Filled 2016-10-22 (×2): qty 3

## 2016-10-22 MED ORDER — VITAMIN D (ERGOCALCIFEROL) 1.25 MG (50000 UNIT) PO CAPS
50000.0000 [IU] | ORAL_CAPSULE | ORAL | Status: DC
  Administered 2016-10-23: 50000 [IU] via ORAL
  Filled 2016-10-22: qty 1

## 2016-10-22 MED ORDER — ROSUVASTATIN CALCIUM 10 MG PO TABS
10.0000 mg | ORAL_TABLET | Freq: Every day | ORAL | Status: DC
  Administered 2016-10-22 – 2016-10-23 (×2): 10 mg via ORAL
  Filled 2016-10-22: qty 2
  Filled 2016-10-22 (×2): qty 1
  Filled 2016-10-22: qty 2

## 2016-10-22 MED ORDER — TIOTROPIUM BROMIDE MONOHYDRATE 18 MCG IN CAPS
18.0000 ug | ORAL_CAPSULE | Freq: Every day | RESPIRATORY_TRACT | Status: DC
  Administered 2016-10-22 – 2016-10-23 (×2): 18 ug via RESPIRATORY_TRACT
  Filled 2016-10-22: qty 5

## 2016-10-22 NOTE — Progress Notes (Signed)
Patient appears agitated and wanderings hallways wanting to leave nursing unit, pt attempted to pullout TELE but then refused for RN to remove it for it. Pt also verbalizes that staffs trying to hurt her and wanting to call the police. Pt refused care and VS. MD aware of behavior, orders received. Will continue to monitor.  Sim BoastHavy, RN

## 2016-10-22 NOTE — Progress Notes (Signed)
At shift change, patient will not follow commands, look at staff, respond to staff.  Patient wandering room, will not get back in bed to set bed alarm.  Patient daughter, [name] called for permission to have camera turned on in room to better monitor patient safety as patient is no longer IVC and without a Recruitment consultantsafety sitter.  [name] verbalized permission over phone to RN.  Camera turned on to room and documented properly in chart. Continue to monitor patient.

## 2016-10-22 NOTE — Progress Notes (Signed)
PROGRESS NOTE    [name]  ZOX:[id number]  DOB: October 26, 1945  DOA: 10/21/2016 PCP: Rinaldo CloudHarwani, Mohan, MD   Brief Admission Hx: [name] is a 71 y.o. female with history of stroke recently admitted and discharged to rehabilitation was brought to the hospital after patient was found to be confused and [place] the [place] yesterday [place].  MDM/Assessment & Plan:   1. Acute encephalopathy - resolved now, most likely secondary to effects of prior recent CVA and taking lorazepam which has been discontinued, pt has been back at her baseline now and not confused or wandering anymore. Sitter has been discontinued.  Pt was seen by neurology and no new findings or recommendations were left.  Continue DAPT per previous neuro recommendations.  See notes.    2. Recent CVA -  affecting the vision on the left side - on antiplatelet agents and statins which will be continued.  3. Hypertension on Cozaar and calcium channel blocker. 4. Chronic kidney disease stage II - if creatinine worsens may have to hold ARB. 5. COPD -stable, not actively wheezing.   DVT prophylaxis: Lovenox.  Code Status: Full code.  Family Communication: Patient's daughter.  Disposition Plan: SNF hopefully will have bed tomorrow, needs to be 24 hours without a sitter Consults called: Neurology.   Subjective: Pt back at baseline, no complaints.   Objective: Vitals:   10/22/16 0738 10/22/16 0835 10/22/16 0842 10/22/16 0929  BP: (!) 143/97   (!) 149/94  Pulse: 88   83  Resp: 18   16  Temp: 98.1 F (36.7 C)     TempSrc: Oral     SpO2: 98% 96% 96% 95%  Weight:      Height:        Intake/Output Summary (Last 24 hours) at 10/22/16 1304 Last data filed at 10/22/16 0900  Gross per 24 hour  Intake              120 ml  Output                0 ml  Net              120 ml   Filed Weights   10/21/16 1856 10/22/16 0345  Weight: 59 kg (130 lb) 61.4 kg (135 lb 6.4 oz)   REVIEW OF SYSTEMS  As per history  otherwise all reviewed and reported negative  Exam:  General exam: awake, alert, oriented, NAD. Cooperative.  Respiratory system: Clear. No increased work of breathing. Cardiovascular system: S1 & S2 heard, RRR. No JVD, murmurs, gallops, clicks or pedal edema. Gastrointestinal system: Abdomen is nondistended, soft and nontender. Normal bowel sounds heard. Central nervous system: Alert and oriented.  Extremities: no CCE.  Data Reviewed: Basic Metabolic Panel:  Recent Labs Lab 10/16/16 0543 10/17/16 0256 10/18/16 0825 10/19/16 0547 10/21/16 1950  NA 142 137 142 142 141  K 4.1 3.6 3.7 3.8 3.9  CL 112* 109 115* 112* 108  CO2 20* 21* 20* 21* 23  GLUCOSE 88 72 98 95 103*  BUN 17 13 8 7 17   CREATININE 1.58* 1.29* 1.00 1.04* 1.50*  CALCIUM 10.2 9.3 10.0 9.9 10.7*   Liver Function Tests:  Recent Labs Lab 10/15/16 2154 10/16/16 0543 10/21/16 1950  AST 21 20 23   ALT 11* 11* 12*  ALKPHOS 58 52 62  BILITOT 0.6 0.7 0.7  PROT 7.4 6.7 8.0  ALBUMIN 3.6 3.3* 4.4   No results for input(s): LIPASE, AMYLASE in the last 168  hours.  Recent Labs Lab 10/21/16 2344  AMMONIA 19   CBC:  Recent Labs Lab 10/15/16 2154 10/15/16 2214 10/17/16 0256 10/18/16 0825 10/19/16 0547 10/21/16 1950  WBC 10.7*  --  9.0 7.3 7.2 11.1*  NEUTROABS 7.3  --   --  4.9 4.6  --   HGB 14.0 14.6 11.9* 12.9 12.5 14.0  HCT 42.7 43.0 37.7 40.5 38.8 42.1  MCV 93.2  --  93.5 94.4 92.8 92.3  PLT 211  --  178 190 167 213   Cardiac Enzymes: No results for input(s): CKTOTAL, CKMB, CKMBINDEX, TROPONINI in the last 168 hours. CBG (last 3)  No results for input(s): GLUCAP in the last 72 hours. Recent Results (from the past 240 hour(s))  MRSA PCR Screening     Status: Abnormal   Collection Time: 10/16/16  4:02 AM  Result Value Ref Range Status   MRSA by PCR POSITIVE (A) NEGATIVE Final    Comment:        The GeneXpert MRSA Assay (FDA approved for NASAL specimens only), is one component of  a comprehensive MRSA colonization surveillance program. It is not intended to diagnose MRSA infection nor to guide or monitor treatment for MRSA infections. RESULT CALLED TO, READ BACK BY AND VERIFIED WITH: Adin Hector [phone] 06.01.2018 N. MORRIS      Studies: Ct Head Wo Contrast  Result Date: 10/21/2016 CLINICAL DATA:  Patient is IVC and was transported via [employer]. Family is concerned about her well being, claims she is hearing voices, wandering outside, being aggressive toward family. EXAM: CT HEAD WITHOUT CONTRAST TECHNIQUE: Contiguous axial images were obtained from the base of the skull through the vertex without intravenous contrast. COMPARISON:  10/15/2016 FINDINGS: Brain: Subacute right occipital infarct as previously described. Stable hypo attenuation and right occipital white matter. No acute hemorrhage. Ventricles and sulci are normal in size and symmetry. No mass, mass effect, or midline shift. Vascular: No hyperdense vessel. Atherosclerotic and physiologic intracranial calcifications. Skull: Normal. Negative for fracture or focal lesion. Sinuses/Orbits: No acute finding. Other: None IMPRESSION: 1. Negative for bleed or other acute intracranial process. 2. Stable appearance of subacute right PCA distribution infarct. Electronically Signed   By: Corlis Leak M.D.   On: 10/21/2016 21:39   Mr Brain Wo Contrast  Result Date: 10/22/2016 CLINICAL DATA:  Initial evaluation for acute encephalopathy. EXAM: MRI HEAD WITHOUT CONTRAST TECHNIQUE: Multiplanar, multiecho pulse sequences of the brain and surrounding structures were obtained without intravenous contrast. COMPARISON:  Prior MRI from 10/16/2016. FINDINGS: Brain: Study moderately degraded by motion artifact. Generalized age-related cerebral atrophy again noted. Patchy T2/FLAIR hyperintensity throughout the periventricular and deep white matter both cerebral hemispheres most consistent with chronic small vessel ischemic  disease, moderate to advanced in nature. Chronic microvascular ischemic changes present within the pons. There has been interval evolution of the previously identified large right PCA territory infarct, overall similar in distribution without evidence for interval extension. Probable early changes related to laminar necrosis noted on T1 weighted sequence. No evidence for hemorrhagic transformation. Diffusion-weighted signal extending across the splenium suspected to be reactive in nature. Additional small left cerebellar infarct noted as well. No new acute intracranial infarct identified. Gray-white matter differentiation otherwise maintained. No other acute or chronic intracranial hemorrhage identified. No mass lesion, midline shift or mass effect. No hydrocephalus. No extra-axial fluid collection. Major dural sinuses are patent. Pituitary gland suprasellar region within normal limits. Vascular: Major intracranial vascular flow voids maintained. Skull and upper cervical spine: Craniocervical junction  within normal limits. Mild degenerate spondylolysis noted within the visualized upper cervical spine without significant stenosis. Bone marrow signal intensity normal. No scalp soft tissue abnormality. Sinuses/Orbits: Globes and orbital soft tissues within normal limits. Paranasal sinuses are clear. No mastoid effusion. Inner ear structures normal. IMPRESSION: 1. Normal expected interval evolution of right PCA and smaller left cerebellar infarcts. No evidence for interval extension or expansion of infarction. No hemorrhagic transformation or significant mass effect. 2. No other new intracranial process identified. 3. Stable atrophy with moderate to advanced chronic microvascular ischemic disease. Electronically Signed   By: Rise Mu M.D.   On: 10/22/2016 06:43   Scheduled Meds: . aspirin  325 mg Oral Daily  . clopidogrel  75 mg Oral Daily  . diltiazem  240 mg Oral Daily  . enoxaparin (LOVENOX)  injection  40 mg Subcutaneous Q24H  . escitalopram  10 mg Oral Daily  . losartan  100 mg Oral Daily  . mometasone-formoterol  2 puff Inhalation BID  . montelukast  10 mg Oral Daily  . polyethylene glycol  17 g Oral Daily  . rosuvastatin  10 mg Oral Daily  . senna-docusate  1 tablet Oral BID  . thiamine  100 mg Oral Daily  . tiotropium  18 mcg Inhalation Daily  . [START ON 10/23/2016] Vitamin D (Ergocalciferol)  50,000 Units Oral Weekly   Continuous Infusions:  Principal Problem:   Acute encephalopathy Active Problems:   TOBACCO ABUSE   Essential hypertension   Chronic obstructive pulmonary disease (HCC)   Time spent:   Standley Dakins, MD, FAAFP [facility] Pager [phone]  If 7PM-7AM, please contact night-coverage [email or web address] Password [email or web address] 10/22/2016, 1:04 PM    LOS: 0 days

## 2016-10-22 NOTE — Progress Notes (Signed)
Responded to consult request from nurse and visited w/ pt, providing emotional/spiritual support and prayer, which pt greatly appreciated. Pt believes she has much awareness of what is affecting her body, and believes she knows when she picked up MRSA. She is missing her children and would really like some fried chicken!    10/22/16 1400  Clinical Encounter Type  Visited With Patient;Health care provider  Visit Type Initial;Psychological support;Spiritual support;Social support  Referral From Nurse  Spiritual Encounters  Spiritual Needs Prayer;Emotional  Stress Factors  Patient Stress Factors Health changes;Loss of control   [name], [address]

## 2016-10-22 NOTE — ED Notes (Signed)
Pt transported via care link to [place] floor 5C

## 2016-10-22 NOTE — Progress Notes (Signed)
10/22/2016 6:11 PM  RN reporting patient having sundowning symptoms, confused and having hallucinations.  Will order haloperidol as needed, seroquel 25 mg QHS.  Follow.   Maryln Manuel. Johnson MD

## 2016-10-22 NOTE — H&P (Signed)
History and Physical    [name]:[id number] DOB: 08-12-1945 DOA: 10/21/2016  PCP: Rinaldo Cloud, MD  Patient coming from: Home.  Chief Complaint: Confusion.  HPI: [name] is a 71 y.o. female with history of stroke recently admitted and discharged to rehabilitation was brought to the hospital after patient was found to be confused and wandering in the mall yesterday afternoon. As per the daughter who provided the history patient was discharged from rehabilitation night before last occupation was confused. Yesterday patient was found to be wandering in the mall without footwear as per the daughter. Patient was involuntarily committed and brought to the ER.    ED Course: CT head was unremarkable. Since patient had a recent stroke neurologist was consulted and neurologist advised transfer to [facility] to get MRI and further stroke workup with MRI positive. On exam patient is moving all extremities 5 x 5. Still has decreased vision on the left side from previous stroke. Patient is well-oriented to time place and person.  Review of Systems: As per HPI, rest all negative.   Past Medical History:  Diagnosis Date  . ALLERGIC RHINITIS   . Arthritis   . ASTHMA, UNSPECIFIED, UNSPECIFIED STATUS   . DYSLIPIDEMIA   . Headache(784.0)   . Hyperlipidemia 02/20/2009   Qualifier: Diagnosis of  By: [name] MD, [name] HYPERTENSION   . Hypertension   . Mild renal insufficiency   . PNA (pneumonia)   . Shortness of breath dyspnea   . SPINAL STENOSIS, LUMBAR    MRI 12/2005; surg 2004  . TOBACCO ABUSE     Past Surgical History:  Procedure Laterality Date  . ANTERIOR LAT LUMBAR FUSION Right 12/30/2015   Procedure: Lumbar two-three, Lumbar three-four Anterior Lateral Lumbar Interbody Fusion with Anterior column realignment at Lumbar three-four;  Surgeon: [name], MD;  Location: [facility] NEURO ORS;  Service: Neurosurgery;  Laterality: Right;  . Closure of bronchopleural  fistula, right lower lobe.    [name] COLONOSCOPY    . Decompressive Laminectomy     Fusion L4-5, L5-S1 ([facility]-2004)  . Decortication of right lower lobe    . LOOP RECORDER INSERTION N/A 10/19/2016   Procedure: Loop Recorder Insertion;  Surgeon: Duke Salvia, MD;  Location: [facility] INVASIVE CV LAB;  Service: Cardiovascular;  Laterality: N/A;  . Placement of wound On-Q analgesia irrigation system.    [name] POSTERIOR LUMBAR FUSION 4 LEVEL N/A 12/30/2015   Procedure: Lumbar two -Sacral one Dorsal Internal Fixation and Fusion, [name] osteotomies Lumbar two-three, Lumbar three-four;  Surgeon: [name], MD;  Location: [facility] NEURO ORS;  Service: Neurosurgery;  Laterality: N/A;  . Right hand  1995  . Right knee  1990  . Right video-assisted thoracoscopic surgery, drainage of empyema.  02/12/2011  . TEE WITHOUT CARDIOVERSION N/A 10/19/2016   Procedure: TRANSESOPHAGEAL ECHOCARDIOGRAM (TEE);  Surgeon: [name], MD;  Location: [facility] ENDOSCOPY;  Service: Cardiovascular;  Laterality: N/A;     reports that she has been smoking Cigarettes.  She has a 10.00 pack-year smoking history. She has never used smokeless tobacco. She reports that she does not drink alcohol or use drugs.  Allergies  Allergen Reactions  . Shrimp [Shellfish Allergy] Hives and Swelling    Family History  Problem Relation Age of Onset  . Mental illness Mother   . Hypertension Mother   . Arthritis Other   . Hypertension Other     Prior to Admission medications   Medication Sig Start  Date End Date Taking? Authorizing Provider  albuterol (PROVENTIL HFA;VENTOLIN HFA) 108 (90 Base) MCG/ACT inhaler Inhale 2 puffs into the lungs every 4 (four) hours as needed for wheezing or shortness of breath. 08/30/16  Yes Lavera Guise, MD  diltiazem (DILACOR XR) 240 MG 24 hr capsule Take 240 mg by mouth daily.   Yes [provider]  diphenhydrAMINE (BENADRYL) 25 MG tablet Take 25 mg by mouth every 6 (six) hours as needed for itching.    Yes [provider]  escitalopram (LEXAPRO) 10 MG tablet Take 10 mg by mouth daily.   Yes [provider]  Fluticasone-Salmeterol (ADVAIR DISKUS) 250-50 MCG/DOSE AEPB Inhale 1 puff into the lungs 2 (two) times daily.     Yes [provider]  losartan (COZAAR) 100 MG tablet Take 1 tablet by mouth daily. 07/27/16  Yes [provider]  rosuvastatin (CRESTOR) 10 MG tablet Take 10 mg by mouth daily.    Yes [provider]  SINGULAIR 10 MG tablet Take 1 tablet by mouth daily. 07/02/16  Yes [provider]  thiamine (VITAMIN B-1) 100 MG tablet Take 100 mg by mouth daily. 04/30/16  Yes [provider]  tiotropium (SPIRIVA) 18 MCG inhalation capsule Place 18 mcg into inhaler and inhale daily.     Yes [provider]  Vitamin D, Ergocalciferol, (DRISDOL) 50000 units CAPS capsule Take 50,000 Units by mouth once a week. 07/14/16  Yes [provider]  aspirin 325 MG tablet Take 1 tablet (325 mg total) by mouth daily. 10/21/16   Rodolph Bong, MD  clopidogrel (PLAVIX) 75 MG tablet Take 1 tablet (75 mg total) by mouth daily. 10/21/16   Rodolph Bong, MD  LORazepam (ATIVAN) 0.5 MG tablet Take 1 tablet (0.5 mg total) by mouth 2 (two) times daily as needed for anxiety. 10/20/16   Rodolph Bong, MD  nicotine (NICODERM CQ - DOSED IN MG/24 HOURS) 21 mg/24hr patch Place 1 patch (21 mg total) onto the skin daily. 10/21/16   Rodolph Bong, MD  polyethylene glycol [facility] / [name]) packet Take 17 g by mouth daily. 10/21/16   Rodolph Bong, MD  senna-docusate (SENOKOT-S) 8.6-50 MG tablet Take 1 tablet by mouth 2 (two) times daily. 10/20/16   Rodolph Bong, MD    Physical Exam: Vitals:   10/21/16 2223 10/21/16 2224 10/21/16 2353 10/22/16 0003  BP: 121/82   140/90  Pulse: 90   (!) 102  Resp: 18   (!) 24  Temp: 98.6 F (37 C) 97.8 F (36.6 C) 98.8 F (37.1 C)   TempSrc: Oral Oral    SpO2: 100%   96%  Weight:        Height:          Constitutional: Moderately built and nourished. Vitals:   10/21/16 2223 10/21/16 2224 10/21/16 2353 10/22/16 0003  BP: 121/82   140/90  Pulse: 90   (!) 102  Resp: 18   (!) 24  Temp: 98.6 F (37 C) 97.8 F (36.6 C) 98.8 F (37.1 C)   TempSrc: Oral Oral    SpO2: 100%   96%  Weight:      Height:       Eyes: Anicteric no pallor. ENMT: No discharge from the ears eyes nose and mouth. Neck: No mass felt. No neck rigidity. No JVD appreciated. Respiratory: No rhonchi or crepitations. Cardiovascular: S1 and S2 heard. No murmurs appreciated. Abdomen: Soft nontender bowel sounds present. Musculoskeletal: No edema. No joint  effusion. Skin: No rash. Skin is warm. Neurologic: Alert awake oriented to time place and person. Moves all extremities 5 x 5. Poor vision on the left side of the vision field. No facial asymmetry. Psychiatric: Appears normal. Normal affect.   Labs on Admission: I have personally reviewed following labs and imaging studies  CBC:  Recent Labs Lab 10/15/16 2154 10/15/16 2214 10/17/16 0256 10/18/16 0825 10/19/16 0547 10/21/16 1950  WBC 10.7*  --  9.0 7.3 7.2 11.1*  NEUTROABS 7.3  --   --  4.9 4.6  --   HGB 14.0 14.6 11.9* 12.9 12.5 14.0  HCT 42.7 43.0 37.7 40.5 38.8 42.1  MCV 93.2  --  93.5 94.4 92.8 92.3  PLT 211  --  178 190 167 213   Basic Metabolic Panel:  Recent Labs Lab 10/16/16 0543 10/17/16 0256 10/18/16 0825 10/19/16 0547 10/21/16 1950  NA 142 137 142 142 141  K 4.1 3.6 3.7 3.8 3.9  CL 112* 109 115* 112* 108  CO2 20* 21* 20* 21* 23  GLUCOSE 88 72 98 95 103*  BUN 17 13 8 7 17   CREATININE 1.58* 1.29* 1.00 1.04* 1.50*  CALCIUM 10.2 9.3 10.0 9.9 10.7*   GFR: Estimated Creatinine Clearance: 31.9 mL/min (A) (by C-G formula based on SCr of 1.5 mg/dL (H)). Liver Function Tests:  Recent Labs Lab 10/15/16 2154 10/16/16 0543 10/21/16 1950  AST 21 20 23   ALT 11* 11* 12*  ALKPHOS 58 52 62  BILITOT 0.6 0.7 0.7  PROT  7.4 6.7 8.0  ALBUMIN 3.6 3.3* 4.4   No results for input(s): LIPASE, AMYLASE in the last 168 hours.  Recent Labs Lab 10/21/16 2344  AMMONIA 19   Coagulation Profile:  Recent Labs Lab 10/15/16 2154 10/21/16 1950  INR 1.10 1.01   Cardiac Enzymes: No results for input(s): CKTOTAL, CKMB, CKMBINDEX, TROPONINI in the last 168 hours. BNP (last 3 results) No results for input(s): PROBNP in the last 8760 hours. HbA1C: No results for input(s): HGBA1C in the last 72 hours. CBG: No results for input(s): GLUCAP in the last 168 hours. Lipid Profile: No results for input(s): CHOL, HDL, LDLCALC, TRIG, CHOLHDL, LDLDIRECT in the last 72 hours. Thyroid Function Tests: No results for input(s): TSH, T4TOTAL, FREET4, T3FREE, THYROIDAB in the last 72 hours. Anemia Panel: No results for input(s): VITAMINB12, FOLATE, FERRITIN, TIBC, IRON, RETICCTPCT in the last 72 hours. Urine analysis:    Component Value Date/Time   COLORURINE YELLOW 10/21/2016 2226   APPEARANCEUR HAZY (A) 10/21/2016 2226   LABSPEC 1.019 10/21/2016 2226   PHURINE 5.0 10/21/2016 2226   GLUCOSEU NEGATIVE 10/21/2016 2226   HGBUR SMALL (A) 10/21/2016 2226   BILIRUBINUR NEGATIVE 10/21/2016 2226   KETONESUR 5 (A) 10/21/2016 2226   PROTEINUR 30 (A) 10/21/2016 2226   UROBILINOGEN 0.2 09/21/2014 1051   NITRITE NEGATIVE 10/21/2016 2226   LEUKOCYTESUR NEGATIVE 10/21/2016 2226   Sepsis Labs: @LABRCNTIP (procalcitonin:4,lacticidven:4) ) Recent Results (from the past 240 hour(s))  MRSA PCR Screening     Status: Abnormal   Collection Time: 10/16/16  4:02 AM  Result Value Ref Range Status   MRSA by PCR POSITIVE (A) NEGATIVE Final    Comment:        The GeneXpert MRSA Assay (FDA approved for NASAL specimens only), is one component of a comprehensive MRSA colonization surveillance program. It is not intended to diagnose MRSA infection nor to guide or monitor treatment for MRSA infections. RESULT CALLED TO, READ BACK BY AND  VERIFIED WITH:  K. Laural BenesJOHNSON [id number] 06.01.2018 N. MORRIS      Radiological Exams on Admission: Ct Head Wo Contrast  Result Date: 10/21/2016 CLINICAL DATA:  Patient is IVC and was [place] via [place] [employer]. Family is concerned about her well being, claims she is hearing voices, wandering outside, being aggressive toward family. EXAM: CT HEAD WITHOUT CONTRAST TECHNIQUE: Contiguous axial images were obtained from the base of the skull through the vertex without intravenous contrast. COMPARISON:  10/15/2016 FINDINGS: Brain: Subacute right occipital infarct as previously described. Stable hypo attenuation and right occipital white matter. No acute hemorrhage. Ventricles and sulci are normal in size and symmetry. No mass, mass effect, or midline shift. Vascular: No hyperdense vessel. Atherosclerotic and physiologic intracranial calcifications. Skull: Normal. Negative for fracture or focal lesion. Sinuses/Orbits: No acute finding. Other: None IMPRESSION: 1. Negative for bleed or other acute intracranial process. 2. Stable appearance of subacute right PCA distribution infarct. Electronically Signed   By: Corlis Leak  Hassell M.D.   On: 10/21/2016 21:39     Assessment/Plan Principal Problem:   Acute encephalopathy Active Problems:   TOBACCO ABUSE   Essential hypertension   Chronic obstructive pulmonary disease (HCC)    1. Acute encephalopathy - discussed with neurologist Dr. Amada JupiterKirkpatrick. At this time patient appears to be back to baseline. Check ammonia levels. If MRI brain is negative no further workup.  2. Reason stroke affecting the vision on the left side - on antiplatelet agents and statins. 3. Hypertension on Cozaar and calcium channel blocker. 4. Chronic kidney disease stage II - if creatinine worsens may have to hold ARB. 5. COPD -not actively wheezing.   [facility]. Family agreeable to transfer. Dr. Ival Biblehris Danford will be the accepting  physician.  I have reviewed patient's old charts and labs.   DVT prophylaxis: Lovenox.  Code Status: Full code.  Family Communication: Patient's daughter.  Disposition Plan: To be determined.  Consults called: Neurology.  Admission status: Observation.    Eduard ClosKAKRAKANDY,Rishi Vicario N. MD [facility] Pager [phone].  If 7PM-7AM, please contact night-coverage [email or web address] Password [email or web address]  10/22/2016, 12:34 AM
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
## 2016-10-22 NOTE — Consult Note (Addendum)
Neurology Consultation Reason for Consult: Altered mental status Referring Physician: Sarita Haver  CC: Altered mental status  History is obtained from: Chart review, patient  HPI: [name] is a 71 y.o. female with a history of recent stroke he was discharged on 6/5 to [facility] health. She was found wandering outside the facility yesterday confused. The patient states that she just wasn't certain how to get home. Because of the confusion, she was involuntarily committed with concern for psychiatric reasons for the confusion.  Due to concern for possible extension of stroke or other stroke related issues she was transferred to [facility]. A nurse here he was taking care of her prior to discharge states that she seems unchanged compared to her previous exam.   ROS: A 14 point ROS was performed and is negative except as noted in the HPI.   Past Medical History:  Diagnosis Date  . ALLERGIC RHINITIS   . Arthritis   . ASTHMA, UNSPECIFIED, UNSPECIFIED STATUS   . DYSLIPIDEMIA   . Headache(784.0)   . Hyperlipidemia 02/20/2009   Qualifier: Diagnosis of  By: [name] MD, [name] HYPERTENSION   . Hypertension   . Mild renal insufficiency   . PNA (pneumonia)   . Shortness of breath dyspnea   . SPINAL STENOSIS, LUMBAR    MRI 12/2005; surg 2004  . TOBACCO ABUSE      Family History  Problem Relation Age of Onset  . Mental illness Mother   . Hypertension Mother   . Arthritis Other   . Hypertension Other      Social History:  reports that she has been smoking Cigarettes.  She has a 10.00 pack-year smoking history. She has never used smokeless tobacco. She reports that she does not drink alcohol or use drugs.   Exam: Current vital signs: BP (!) 144/97 (BP Location: Left Arm) Comment: returned from mri  Pulse 81   Temp 98.6 F (37 C) (Oral)   Resp 16   Ht 5\' 5"  (1.651 m)   Wt 61.4 kg (135 lb 6.4 oz)   SpO2 95%   BMI 22.53 kg/m  Vital signs in last 24 hours: Temp:   [97.8 F (36.6 C)-98.8 F (37.1 C)] 98.6 F (37 C) (06/07 9528) Pulse Rate:  [81-117] 81 (06/07 0621) Resp:  [14-24] 16 (06/07 0621) BP: (121-149)/(82-98) 144/97 (06/07 0621) SpO2:  [95 %-100 %] 95 % (06/07 0621) Weight:  [59 kg (130 lb)-61.4 kg (135 lb 6.4 oz)] 61.4 kg (135 lb 6.4 oz) (06/07 0345)   Physical Exam  Constitutional: Appears well-developed and well-nourished.  Psych: Affect appropriate to situation Eyes: No scleral injection HENT: No OP obstrucion Head: Normocephalic.  Cardiovascular: Normal rate and regular rhythm.  Respiratory: Effort normal and breath sounds normal to anterior ascultation GI: Soft.  No distension. There is no tenderness.  Skin: WDI  Neuro: Mental Status: Patient is awake, alert, oriented to person, place, month, year, and situation. Patient is able to give a clear and coherent history. No signs of aphasia, does not seem to attend the left as well as the right Cranial Nerves: II: She has a dense left hemianopia Pupils are equal, round, and reactive to light.   III,IV, VI: EOMI without ptosis or diploplia.  V: Facial sensation is symmetric to temperature VII: Facial movement is symmetric.  VIII: hearing is intact to voice X: Uvula elevates symmetrically XI: Shoulder shrug is symmetric. XII: tongue is midline without atrophy or fasciculations.  Motor: Tone is  normal. Bulk is normal. She has 4/5 left leg weakness, though possibly due to poor effort from neglect. Normal strength in bilateral arms Sensory: Sensation is symmetric to light touch and temperature in the arms and legs. Cerebellar: FNF intact   I have reviewed labs in epic and the results pertinent to this consultation are: CMP-creatinine 1.5  I have reviewed the images obtained: CT head-unremarkable  Impression: 71 year old female with a history of recent stroke who was noted to have confusion at the rehabilitation facility. I suspect this is cognitive issues related to her  stroke rather than some new deficit. There is question of some mild left leg weakness which was not documented previously and therefore I do think further imaging with MRI is prudent. She will need rehabilitation for her cognitive deficits, it is unclear to me why IVC paperwork was done.  Recommendations: 1) MRI brain 2) if no new stroke on MRI, then no further recommendations from neurology at this time and neurology will sign off.    Ritta SlotMcNeill Bobbyjoe Pabst, MD [facility] [phone]  If 7pm- 7am, please page neurology on call as listed in AMION.

## 2016-10-22 NOTE — Clinical Social Work Note (Signed)
Clinical Social Work Assessment  Patient Details  Name: [name] MRN: [id number] Date of Birth: [birth year]  Date of referral:  10/22/16               Reason for consult:  Facility Placement, Discharge Planning                Permission sought to share information with:  Facility Medical sales representative, Family Supports Permission granted to share information::  Yes, Verbal Permission Granted  Name::     Lesley, [place]  Agency::  SNF  Relationship::  Daughters  Solicitor Information:     Housing/Transportation Living arrangements for the past 2 months:  Apartment Source of Information:  Patient Patient Interpreter Needed:  None Criminal Activity/Legal Involvement Pertinent to Current Situation/Hospitalization:  No - Comment as needed Significant Relationships:  Adult Children Lives with:  Self Do you feel safe going back to the place where you live?  Yes Need for family participation in patient care:  Yes (Comment) (patient had confusion due to Ativan; was IVC'd to the hospital)  Care giving concerns:  Patient was discharged on 10/20/16 to [facility], but was sent home due to severe confusion and inability to follow commands. Patient wandered away from home with her daughter on 10/21/16, and was involuntarily committed to the hospital due to confusion. Patient's daughter is concerned about the patient's safety and ability to care for herself.   Social Worker assessment / plan:  CSW engaged in discussion with patient's daughter, to obtain information about what happened. Patient's daughter described how the facility had contacted her at 10:00 on 10/20/16 to come and pick the patient up because she had been wandering around the facility and was not following commands. Patient's daughter described how the patient was severely confused, argumentative, refusing to cooperate, and couldn't be left alone. Patient's daughter explained that the patient left the home and was wandering barefoot  across the street in [place], and the family didn't know what else to do but call the police. Patient's daughter discussed that she needs to feel like the rehab placement for the patient is safe and secure, so that she doesn't worry about her safety. CSW received information from the doctor that the confusion was likely caused by medication (Ativan), and has dissipated since the patient was taken off of the medication. CSW contacted [facility] to discuss what happened with the facility and see if they would take the patient back now that she has calmed down. CSW researched facilities with a locked unit, to make sure the patient would be safe if she wandered away as she is still under involuntary commitment. CSW contacted [facility], that has a locked unit, and confirmed they could take the patient. CSW discussed availability with patient's daughter. CSW discussed with patient, and obtained information about what the patient felt had happened to her. Patient discussed that she felt very confused and didn't know what was happening, and she never wants to be on that medication again. Patient discussed rehab as well as receiving home health after she is ready to leave rehab. CSW will follow to facilitate discharge.  Employment status:  Retired Health and safety inspector:  Medicare PT Recommendations:  Skilled Nursing Facility Information / Referral to community resources:  Skilled Nursing Facility  Patient/Family's Response to care:  Patient and patient's daughter agreeable to SNF placement.  Patient/Family's Understanding of and Emotional Response to Diagnosis, Current Treatment, and Prognosis:  Patient's daughter described the previous incident with  her mother as "terrifying" and "a nightmare". Patient's daughter expressed serious concern about bringing the patient home before receiving rehab and some supervision to recover from her stroke. Patient's daughter seemed aware of the patient's  current limitations and deficits, and understanding of the options available. Patient seemed to understand what had happened to her, and was able to express what she was feeling. Patient also acknowledged that she needed help and support in order to get stronger before she felt safe returning back home. Patient expressed a desire to feel secure in her surroundings, so that she knew that she had support and could take care of herself.  Emotional Assessment Appearance:  Appears stated age Attitude/Demeanor/Rapport:    Affect (typically observed):  Appropriate Orientation:  Oriented to Self, Oriented to Place, Oriented to  Time, Oriented to Situation Alcohol / Substance use:  Not Applicable Psych involvement (Current and /or in the community):  No (Comment)  Discharge Needs  Concerns to be addressed:  Care Coordination, Discharge Planning Concerns Readmission within the last 30 days:  Yes Current discharge risk:  Lives alone, Physical Impairment Barriers to Discharge:  Continued Medical Work up   Dollar GeneralElizabeth M Chaquetta Schlottman, LCSW 10/22/2016, 3:57 PM

## 2016-10-22 NOTE — Care Management Obs Status (Signed)
MEDICARE OBSERVATION STATUS NOTIFICATION   Patient Details  Name: [name] MRN: [id number] Date of Birth: 06/02/1945   Medicare Observation Status Notification Given:  Yes    Kermit BaloKelli F Nai Borromeo, RN 10/22/2016, 2:26 PM

## 2016-10-22 NOTE — Evaluation (Signed)
Physical Therapy Evaluation Patient Details Name: [name] MRN: [id number] DOB: [birth year] Today's Date: 10/22/2016   History of Present Illness  Pt is a 71 y/o female admitted after she was found wandering outside at her SNF and found to have AMS. 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 MRI completed upon admission and neurology reports no new changes to infarct. PMH includes CVA, COPD, HTN, spinal stenosis, tobacco abuse, and posterior lumbar fusion.   Clinical Impression  Pt admitted secondary to problem above with deficits below. Pt recently discharged from hospital to SNF and returned secondary to altered mental status. Upon evaluation, pt continues to present with decreased cognition and safety awareness during mobility. Pt requiring min A during ambulation secondary to decreased steadiness and L sided weakness. Pt also has L visual field cut, and required max cues to avoid obstacles on the L. Recommending return to SNF at d/c to maximize functional mobility independence. Will continue to follow acutely.     Follow Up Recommendations SNF;Supervision for mobility/OOB    Equipment Recommendations  None recommended by PT    Recommendations for Other Services       Precautions / Restrictions Precautions Precautions: Fall Precaution Comments: left visual field cut; inattention to L side of body and environment.  Restrictions Weight Bearing Restrictions: No      Mobility  Bed Mobility Overal bed mobility: Needs Assistance Bed Mobility: Sit to Supine;Supine to Sit     Supine to sit: Min guard Sit to supine: Min guard   General bed mobility comments: Increased time required to perform tasks. Required multiple cues to complete task secondary to inability to sustain attention to task.   Transfers Overall transfer level: Needs assistance Equipment used: 1 person hand held assist Transfers: Sit to/from Stand Sit to Stand: Min assist         General  transfer comment: Min A for steadying and use of HHA.   Ambulation/Gait Ambulation/Gait assistance: Min assist Ambulation Distance (Feet): 50 Feet Assistive device: 1 person hand held assist Gait Pattern/deviations: Step-through pattern;Decreased stride length;Narrow base of support Gait velocity: decreased   General Gait Details: Decreased awareness of L side of body. MAnual assist to avoid obstacles on L side and manual cues for turning to L. Max verbal cues required for attention to L.   Stairs            Wheelchair Mobility    Modified Rankin (Stroke Patients Only) Modified Rankin (Stroke Patients Only) Pre-Morbid Rankin Score: Slight disability Modified Rankin: Moderately severe disability     Balance Overall balance assessment: Needs assistance Sitting-balance support: No upper extremity supported;Feet supported Sitting balance-Leahy Scale: Good     Standing balance support: Single extremity supported;During functional activity Standing balance-Leahy Scale: Poor Standing balance comment: continues to requires UE support for balance.                             Pertinent Vitals/Pain Pain Assessment: No/denies pain    Home Living Family/patient expects to be discharged to:: Skilled nursing facility                      Prior Function Level of Independence: Needs assistance         Comments: Unable to answer questions about assist required. Previously at SNF, so assume assist was required for ADLs and mobility      Hand Dominance   Dominant Hand: Right  Extremity/Trunk Assessment   Upper Extremity Assessment Upper Extremity Assessment: LUE deficits/detail LUE Deficits / Details: Inattention to LUE. Leaving behind during bed mobility     Lower Extremity Assessment Lower Extremity Assessment: LLE deficits/detail LLE Deficits / Details: Grossly ~3/5 throughout.    Cervical / Trunk Assessment Cervical / Trunk Assessment:  Other exceptions Cervical / Trunk Exceptions: history of spinal surgery  Communication   Communication: No difficulties  Cognition Arousal/Alertness: Awake/alert Behavior During Therapy: WFL for tasks assessed/performed Overall Cognitive Status: History of cognitive impairments - at baseline (Previous CVA) Area of Impairment: Attention;Memory;Safety/judgement;Problem solving                   Current Attention Level: Sustained Memory: Decreased short-term memory   Safety/Judgement: Decreased awareness of safety;Decreased awareness of deficits Awareness: Emergent Problem Solving: Slow processing;Decreased initiation;Difficulty sequencing;Requires verbal cues;Requires tactile cues General Comments: Pt very talkative this session, and easily distractable. Decreased awareness of safety and current situation. Reports she would like to go home with PT so that I could take care of her and was wondering when her daughter could take her to the bank.       General Comments General comments (skin integrity, edema, etc.): Pt holding conversation upon entry with no one in room. Continued to converse throughout and perseverated on accident she was in.     Exercises     Assessment/Plan    PT Assessment Patient needs continued PT services  PT Problem List Decreased strength;Decreased mobility;Decreased safety awareness;Decreased balance;Impaired sensation;Decreased cognition       PT Treatment Interventions      PT Goals (Current goals can be found in the Care Plan section)  Acute Rehab PT Goals Patient Stated Goal: to get back to walk and see PT Goal Formulation: With patient Time For Goal Achievement: 11/05/16 Potential to Achieve Goals: Good    Frequency Min 3X/week   Barriers to discharge        Co-evaluation               AM-PAC PT "6 Clicks" Daily Activity  Outcome Measure Difficulty turning over in bed (including adjusting bedclothes, sheets and blankets)?: A  Little Difficulty moving from lying on back to sitting on the side of the bed? : A Little Difficulty sitting down on and standing up from a chair with arms (e.g., wheelchair, bedside commode, etc,.)?: Total Help needed moving to and from a bed to chair (including a wheelchair)?: A Little Help needed walking in hospital room?: A Little Help needed climbing 3-5 steps with a railing? : A Lot 6 Click Score: 15    End of Session Equipment Utilized During Treatment: Gait belt Activity Tolerance: Patient tolerated treatment well Patient left: in bed;with call bell/phone within reach;with bed alarm set Nurse Communication: Mobility status PT Visit Diagnosis: Unsteadiness on feet (R26.81)    Time: 1359-1415 PT Time Calculation (min) (ACUTE ONLY): 16 min   Charges:   PT Evaluation $PT Eval Moderate Complexity: 1 Procedure     PT G Codes:   PT G-Codes **NOT FOR INPATIENT CLASS** Functional Assessment Tool Used: AM-PAC 6 Clicks Basic Mobility;Clinical judgement Functional Limitation: Mobility: Walking and moving around Mobility: Walking and Moving Around Current Status (Z6109): At least 40 percent but less than 60 percent impaired, limited or restricted Mobility: Walking and Moving Around Goal Status [phone]): At least 1 percent but less than 20 percent impaired, limited or restricted    Margot Chimes, PT, DPT  Acute Rehabilitation Services  Pager: [phone]  Melvyn Novas 10/22/2016, 3:53 PM

## 2016-10-22 NOTE — Progress Notes (Signed)
Neurology Note  Patient seen this morning by Dr. Amada JupiterKirkpatrick. I have discussed the patient with him directly. I have also reviewed her chart. She was [facility] following treatment of a R PCA ischemic infarct and is now re-admitted due to reported confusion at her SNF with family placing her on IVC. MRI ordered to assess for any new ischemia.   MRI brain has been completed. I have personally and independently reviewed this scan. I have also reviewed the radiologist's report and agree. This shows restricted diffusion in the R PCA territory and in the R cerebellum consistent with subacute ischemic infarctions. These show the normal expected radiographic evolution when compared to the previous MRI from 10/16/16. There is moderate diffuse chronic small vessel disease. Mild atrophy is present. No new areas of ischemia are seen. There are no acute abnormalities on this scan.   Per Dr. Amada JupiterKirkpatrick, the nursing staff on [facility] are familiar with the patient from her previous admission and indicated that she does not appear any different now than when she was discharged two days ago.   At this time, recommend ongoing supportive care and rehab services. No new neurologic issues and no new recommendations. Will sign off. Please call if any new issues should arise.

## 2016-10-22 NOTE — Progress Notes (Signed)
Patient arrived to unit via [facility].  Vitals stable.  Sitter at bedside.  Patient placed on contact precautions as had positive MRSA swab 10/16/2016.  Patient oriented.  Tele applied and verified.  Continue to monitor.

## 2016-10-22 NOTE — NC FL2 (Signed)
[place] MEDICAID FL2 LEVEL OF CARE SCREENING TOOL     IDENTIFICATION  Patient Name: [name] Birthdate: 15-Sep-1945 Sex: female Admission Date (Current Location): 10/21/2016  [facility] and [place] Number:  Producer, television/film/video and Address:  [facility] [place], [address], [place], [place] [address]      Provider Number: [id number]  Attending Physician Name and Address:  Cleora Fleet, MD  Relative Name and Phone Number:       Current Level of Care: Hospital Recommended Level of Care: Skilled Nursing Facility Prior Approval Number:    Date Approved/Denied:   PASRR Number: [id number]  Discharge Plan: SNF    Current Diagnoses: Patient Active Problem List   Diagnosis Date Noted  . 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 Stroke (HCC) 10/16/2016  . Elevated troponin   . Altered mental status 03/19/2016  . Neuropathic pain   . Anxiety state   . Sleep disturbance   . Post-operative pain   . Spondylolisthesis of lumbar region 01/02/2016  . Surgery, elective   . Chronic obstructive pulmonary disease (HCC)   . Respiratory failure with hypercapnia (HCC)   . Chronic back pain   . Postoperative anemia due to acute blood loss   . Tachycardia   . Leukocytosis   . Thrombocytopenia (HCC)   . Lumbosacral spondylosis with radiculopathy 12/30/2015  . Acute encephalopathy   . AKI (acute kidney injury) (HCC)   . CKD (chronic kidney disease), stage II   . HEADACHE 08/16/2009  . Hyperlipidemia 02/20/2009  . TOBACCO ABUSE 02/20/2009  . Essential hypertension 02/20/2009  . ALLERGIC RHINITIS 02/20/2009  . Asthma 02/20/2009  . SHOULDER PAIN, RIGHT, CHRONIC 02/20/2009  . HIP PAIN, RIGHT, CHRONIC 02/20/2009  . SPINAL STENOSIS, CERVICAL 02/20/2009  . SPINAL STENOSIS, LUMBAR 02/20/2009  . BACK PAIN, LUMBAR 02/20/2009    Orientation RESPIRATION BLADDER Height & Weight     Self, Time, Situation, Place  Normal  Continent Weight: 135 lb 6.4 oz (61.4 kg) Height:  5\' 5"  (165.1 cm)  BEHAVIORAL SYMPTOMS/MOOD NEUROLOGICAL BOWEL NUTRITION STATUS  Wanderer   Continent Diet (cardiac)  AMBULATORY STATUS COMMUNICATION OF NEEDS Skin   Limited Assist Verbally Normal                       Personal Care Assistance Level of Assistance  Bathing, Feeding, Dressing Bathing Assistance: Limited assistance Feeding assistance: Limited assistance Dressing Assistance: Limited assistance     Functional Limitations Info  Sight Sight Info: Impaired (difficulty looking left)        SPECIAL CARE FACTORS FREQUENCY  PT (By licensed PT), OT (By licensed OT)     PT Frequency: 5x/wk OT Frequency: 5x/wk            Contractures      Additional Factors Info  Code Status, Allergies, Psychotropic, Isolation Precautions Code Status Info: full Allergies Info: Shrimp Shellfish Allergy Psychotropic Info: Lexapro 10mg    Isolation Precautions Info: MRSA     Current Medications (10/22/2016):  This is the current hospital active medication list Current Facility-Administered Medications  Medication Dose Route Frequency Provider Last Rate Last Dose  . 0.9 %  sodium chloride infusion   Intravenous Continuous Eduard Clos, MD      . acetaminophen (TYLENOL) tablet 650 mg  650 mg Oral Q4H PRN Eduard Clos, MD       Or  . acetaminophen (TYLENOL) solution 650 mg  650  mg Per Tube Q4H PRN Eduard ClosKakrakandy, Arshad N, MD       Or  . acetaminophen (TYLENOL) suppository 650 mg  650 mg Rectal Q4H PRN Eduard ClosKakrakandy, Arshad N, MD      . albuterol (PROVENTIL) (2.5 MG/3ML) 0.083% nebulizer solution 3 mL  3 mL Inhalation Q4H PRN Eduard ClosKakrakandy, Arshad N, MD      . aspirin tablet 325 mg  325 mg Oral Daily Eduard ClosKakrakandy, Arshad N, MD      . clopidogrel (PLAVIX) tablet 75 mg  75 mg Oral Daily Eduard ClosKakrakandy, Arshad N, MD      . diltiazem (CARDIZEM CD) 24 hr capsule 240 mg  240 mg Oral Daily Eduard ClosKakrakandy, Arshad N, MD      . enoxaparin  (LOVENOX) injection 40 mg  40 mg Subcutaneous Q24H Eduard ClosKakrakandy, Arshad N, MD      . escitalopram (LEXAPRO) tablet 10 mg  10 mg Oral Daily Eduard ClosKakrakandy, Arshad N, MD      . losartan (COZAAR) tablet 100 mg  100 mg Oral Daily Eduard ClosKakrakandy, Arshad N, MD      . mometasone-formoterol [facility](DULERA) 200-5 MCG/ACT inhaler 2 puff  2 puff Inhalation BID Eduard ClosKakrakandy, Arshad N, MD   2 puff at 10/22/16 [phone]  . montelukast (SINGULAIR) tablet 10 mg  10 mg Oral Daily Eduard ClosKakrakandy, Arshad N, MD      . polyethylene glycol (MIRALAX / GLYCOLAX) packet 17 g  17 g Oral Daily Eduard ClosKakrakandy, Arshad N, MD      . rosuvastatin (CRESTOR) tablet 10 mg  10 mg Oral Daily Eduard ClosKakrakandy, Arshad N, MD      . senna-docusate (Senokot-S) tablet 1 tablet  1 tablet Oral BID Eduard ClosKakrakandy, Arshad N, MD      . thiamine (VITAMIN B-1) tablet 100 mg  100 mg Oral Daily Eduard ClosKakrakandy, Arshad N, MD      . tiotropium [facility](SPIRIVA) inhalation capsule 18 mcg  18 mcg Inhalation Daily Eduard ClosKakrakandy, Arshad N, MD   18 mcg at 10/22/16 [phone]  . [START ON 10/23/2016] Vitamin D (Ergocalciferol) (DRISDOL) capsule 50,000 Units  50,000 Units Oral Weekly Eduard ClosKakrakandy, Arshad N, MD         Discharge Medications: Please see discharge summary for a list of discharge medications.  Relevant Imaging Results:  Relevant Lab Results:   Additional Information SS#: [id number]  Baldemar LenisElizabeth M Loribeth Katich, LCSW

## 2016-10-23 DIAGNOSIS — F17200 Nicotine dependence, unspecified, uncomplicated: Secondary | ICD-10-CM | POA: Diagnosis not present

## 2016-10-23 DIAGNOSIS — G9340 Encephalopathy, unspecified: Secondary | ICD-10-CM | POA: Diagnosis not present

## 2016-10-23 DIAGNOSIS — I10 Essential (primary) hypertension: Secondary | ICD-10-CM | POA: Diagnosis not present

## 2016-10-23 DIAGNOSIS — G9349 Other encephalopathy: Secondary | ICD-10-CM | POA: Diagnosis not present

## 2016-10-23 DIAGNOSIS — J449 Chronic obstructive pulmonary disease, unspecified: Secondary | ICD-10-CM | POA: Diagnosis not present

## 2016-10-23 MED ORDER — LORATADINE 10 MG PO TABS
10.0000 mg | ORAL_TABLET | Freq: Once | ORAL | Status: AC
  Administered 2016-10-23: 10 mg via ORAL
  Filled 2016-10-23: qty 1

## 2016-10-23 MED ORDER — QUETIAPINE FUMARATE 25 MG PO TABS: 25.0000 mg | ORAL_TABLET | Freq: Every day | ORAL | Status: AC

## 2016-10-23 MED ORDER — MENTHOL 3 MG MT LOZG
1.0000 | LOZENGE | OROMUCOSAL | Status: DC | PRN
  Filled 2016-10-23: qty 9

## 2016-10-23 NOTE — Evaluation (Signed)
Occupational Therapy Evaluation and Discharge / Defer to SNF Patient Details Name: [name] MRN: [id number] DOB: 09-16-1945 Today's Date: 10/23/2016    History of Present Illness Pt is a 71 y/o female admitted after she was found wandering outside at her SNF and found to have AMS. 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 MRI completed upon admission and neurology reports no new changes to infarct. PMH includes CVA, COPD, HTN, spinal stenosis, tobacco abuse, and posterior lumbar fusion.    Clinical Impression   Pt was at SNF in the middle of rehabilitation from recent stroke, and was getting assist in all ADL and mobility. Visual deficits remain on left side, and Left inattention persists. Decreased ROM and sensation in LUE. Pt requires SNF level therapy to maximize safety and independence in ADL and functional transfers. OT to defer to next venue, as all needs can be met there. OT to sign off in the acute setting at this time.     Follow Up Recommendations  SNF;Supervision/Assistance - 24 hour    Equipment Recommendations  Other (comment) (defer to next venue)    Recommendations for Other Services       Precautions / Restrictions Precautions Precautions: Fall Precaution Comments: left visual field cut; inattention to L side of body and environment.  Restrictions Weight Bearing Restrictions: No      Mobility Bed Mobility Overal bed mobility: Needs Assistance Bed Mobility: Supine to Sit     Supine to sit: Min guard     General bed mobility comments: Increased time required to perform tasks. Required multiple cues to complete task secondary to inability to sustain attention to task.   Transfers Overall transfer level: Needs assistance Equipment used: 1 person hand held assist Transfers: Sit to/from Stand Sit to Stand: Min assist         General transfer comment: Min A for steadying and use of HHA.     Balance Overall balance assessment: Needs  assistance Sitting-balance support: No upper extremity supported;Feet supported Sitting balance-Leahy Scale: Good Sitting balance - Comments: sitting EOB with no back support   Standing balance support: Single extremity supported;During functional activity Standing balance-Leahy Scale: Poor Standing balance comment: continues to requires UE support for balance.                           ADL either performed or assessed with clinical judgement   ADL Overall ADL's : Needs assistance/impaired Eating/Feeding: Moderate assistance;Sitting Eating/Feeding Details (indicate cue type and reason): verbal cues for Pt to find items on left of tray. Compensatory strategies  provided but no retention demonstrated this session. Grooming: Minimal assistance;Wash/dry face;Wash/dry hands;Sitting Grooming Details (indicate cue type and reason): in recliner, with washcloth - cues to include LUE Upper Body Bathing: Minimal assistance   Lower Body Bathing: Minimal assistance;Sitting/lateral leans   Upper Body Dressing : Min guard;Sitting   Lower Body Dressing: Moderate assistance   Toilet Transfer: Minimal assistance;Stand-pivot;BSC Toilet Transfer Details (indicate cue type and reason): Simulated from bed to chair. VC's for safe hand placement and for attention to the L.  Toileting- Clothing Manipulation and Hygiene: Minimal assistance;Sit to/from stand       Functional mobility during ADLs: Minimal assistance (HHA) General ADL Comments: Pt with continued decreased awareness of visual deficits but continues to require VC's to attend to L visual field and left side of body.     Vision Baseline Vision/History: Wears glasses Wears Glasses: At all times  Patient Visual Report: Other (comment) (reports that [facility] broke her glasses - no glasses available) Vision Assessment?: Vision impaired- to be further tested in functional context Eye Alignment: Within Functional Limits Ocular Range of  Motion: Within Functional Limits Alignment/Gaze Preference: Gaze right;Head turned Tracking/Visual Pursuits: Impaired - to be further tested in functional context Convergence: Within functional limits Visual Fields: Left visual field deficit;Impaired-to be further tested in functional context Depth Perception: Overshoots;Undershoots (when reaching for objects with the left hand)     Perception     Praxis      Pertinent Vitals/Pain Pain Assessment: Faces Faces Pain Scale: Hurts a little bit Pain Location: headache Pain Descriptors / Indicators: Headache Pain Intervention(s): Monitored during session;Repositioned     Hand Dominance Right   Extremity/Trunk Assessment Upper Extremity Assessment Upper Extremity Assessment: LUE deficits/detail LUE Deficits / Details: L inattention - responds well with verbal cues LUE Sensation: decreased light touch LUE Coordination: decreased fine motor;decreased gross motor   Lower Extremity Assessment Lower Extremity Assessment: Defer to PT evaluation   Cervical / Trunk Assessment Cervical / Trunk Assessment: Other exceptions Cervical / Trunk Exceptions: history of spinal surgery   Communication Communication Communication: No difficulties   Cognition Arousal/Alertness: Awake/alert Behavior During Therapy: WFL for tasks assessed/performed (anxious, and agitated at family) Overall Cognitive Status: History of cognitive impairments - at baseline (Previous CVA) Area of Impairment: Attention;Memory;Safety/judgement;Problem solving                   Current Attention Level: Sustained Memory: Decreased short-term memory   Safety/Judgement: Decreased awareness of safety;Decreased awareness of deficits Awareness: Emergent Problem Solving: Slow processing;Decreased initiation;Difficulty sequencing;Requires verbal cues;Requires tactile cues General Comments: Pt perseverating on car having flat tire, needing to go to bank to get money, and  that she was angry at family. "I want to surround myself with my own people"   General Comments       Exercises     Shoulder Instructions      Home Living Family/patient expects to be discharged to:: Skilled nursing facility Living Arrangements: Alone Available Help at Discharge: Family;Available PRN/intermittently Type of Home: Apartment Home Access: Level entry     Home Layout: One level     Bathroom Shower/Tub: Tub/shower unit;Curtain   Biochemist, clinical: Standard Bathroom Accessibility: Yes How Accessible: Accessible via walker Home Equipment: Shower seat   Additional Comments: drives - currently very upset with her family as "they are not doing right by me"      Prior Functioning/Environment Level of Independence: Needs assistance    ADL's / Homemaking Assistance Needed: supervision, and cues due to visual deficit   Comments: Pt continues to be poor historian. Pt with improved mental status this session. Pt was at SNF due to stroke.        OT Problem List: Decreased strength;Decreased activity tolerance;Impaired balance (sitting and/or standing);Impaired vision/perception;Decreased coordination;Decreased cognition;Decreased safety awareness;Decreased knowledge of use of DME or AE;Impaired sensation;Impaired UE functional use      OT Treatment/Interventions: Self-care/ADL training;Neuromuscular education;DME and/or AE instruction;Cognitive remediation/compensation;Visual/perceptual remediation/compensation;Patient/family education;Balance training    OT Goals(Current goals can be found in the care plan section) Acute Rehab OT Goals Patient Stated Goal: to get back to walk and see OT Goal Formulation: With patient Time For Goal Achievement: 11/06/16 Potential to Achieve Goals: Good  OT Frequency: Min 3X/week   Barriers to D/C:    Pt lives alone       Co-evaluation  AM-PAC PT "6 Clicks" Daily Activity     Outcome Measure Help from another  person eating meals?: A Little Help from another person taking care of personal grooming?: A Little Help from another person toileting, which includes using toliet, bedpan, or urinal?: A Little Help from another person bathing (including washing, rinsing, drying)?: A Lot Help from another person to put on and taking off regular upper body clothing?: A Lot Help from another person to put on and taking off regular lower body clothing?: A Lot 6 Click Score: 15   End of Session Equipment Utilized During Treatment: Gait belt Nurse Communication: Mobility status  Activity Tolerance: Patient tolerated treatment well Patient left: in chair;with call bell/phone within reach;with chair alarm set;Other (comment) (with social worker present)  OT Visit Diagnosis: Unsteadiness on feet (R26.81);Other abnormalities of gait and mobility (R26.89);Other symptoms and signs involving the nervous system (R29.898);Hemiplegia and hemiparesis Hemiplegia - Right/Left: Left Hemiplegia - dominant/non-dominant: Non-Dominant Hemiplegia - caused by: Cerebral infarction                Time: 1212-1226 OT Time Calculation (min): 14 min Charges:  OT General Charges $OT Visit: 1 Procedure OT Evaluation $OT Eval Moderate Complexity: 1 Procedure G-Codes: OT G-codes **NOT FOR INPATIENT CLASS** Functional Assessment Tool Used: AM-PAC 6 Clicks Daily Activity Functional Limitation: Self care Self Care Current Status (Q4920): At least 40 percent but less than 60 percent impaired, limited or restricted Self Care Goal Status (F0071): At least 20 percent but less than 40 percent impaired, limited or restricted Self Care Discharge Status [phone]): At least 40 percent but less than 60 percent impaired, limited or restricted   Hulda Humphrey OTR/L [place] 10/23/2016, 1:10 PM

## 2016-10-23 NOTE — Progress Notes (Signed)
Patient daughter and sister came visited with patient for approximately two hours, calming patient down.  Patient agreeable to staff at this time, resting in bed. Wandering in hall at times, responding to staff when asked to return to room.  Continue to monitor through night.

## 2016-10-23 NOTE — Care Management Note (Signed)
Case Management Note  Patient Details  Name: [name] MRN: [id number] Date of Birth: Mar 25, 1946  Subjective/Objective:                    Action/Plan: [facility]. No further needs per CM.  Expected Discharge Date:  10/23/16               Expected Discharge Plan:  Skilled Nursing Facility  In-House Referral:  Clinical Social Work  Discharge planning Services     Post Acute Care Choice:    Choice offered to:     DME Arranged:    DME Agency:     HH Arranged:    HH Agency:     Status of Service:  Completed, signed off  If discussed at [employer] Meetings, dates discussed:    Additional Comments:  [name], RN 10/23/2016, 1:06 PM

## 2016-10-23 NOTE — Discharge Instructions (Signed)
Please discontinue aspirin after 3 months but continue taking the plavix.  Please don't start smoking again Seek medical care or return if symptoms worsen or new problems develop.

## 2016-10-23 NOTE — Clinical Social Work Placement (Signed)
   CLINICAL SOCIAL WORK PLACEMENT  NOTE  Date:  10/23/2016  Patient Details  Name: [name] MRN: [id number] Date of Birth: [birth year]  Clinical Social Work is seeking post-discharge placement for this patient at the Skilled  Nursing Facility level of care (*CSW will initial, date and re-position this form in  chart as items are completed):  Yes   Patient/family provided with [facility] Clinical Social Work Department's list of facilities offering this level of care within the geographic area requested by the patient (or if unable, by the patient's family).  Yes   Patient/family informed of their freedom to choose among providers that offer the needed level of care, that participate in Medicare, Medicaid or managed care program needed by the patient, have an available bed and are willing to accept the patient.  Yes   Patient/family informed of [facility]'s ownership interest in [facility] and [facility], as well as of the fact that they are under no obligation to receive care at these facilities.  PASRR submitted to EDS on       PASRR number received on       Existing PASRR number confirmed on       FL2 transmitted to all facilities in geographic area requested by pt/family on       FL2 transmitted to all facilities within larger geographic area on 10/22/16     Patient informed that his/her managed care company has contracts with or will negotiate with certain facilities, including the following:            Patient/family informed of bed offers received.  [facility]     Physician recommends and patient chooses bed at      [facility].  Patient to be transferred to facility by [facility]     Patient family notified on 10/23/16 of transfer.  Name of family member notified:  [name]     PHYSICIAN       Additional Comment:    _______________________________________________ Baldemar LenisElizabeth M Shanaya Schneck,  LCSW 10/23/2016, 1:24 PM

## 2016-10-23 NOTE — Discharge Summary (Signed)
Physician Discharge Summary  [name] ZOX:[id number] DOB: 1946/04/01 DOA: 10/21/2016  PCP: Rinaldo Cloud, MD Neurologist: Everlena Cooper  Admit date: 10/21/2016 Discharge date: 10/23/2016  Admitted From: SNF  Disposition:  SNF  Recommendations for Outpatient Follow-up:  1. Follow up with PCP in 1-2 weeks 2. Follow up with neurologist Jaffe in 4 weeks 3. Please obtain BMP in 1-2 weeks 4. Please monitor blood pressure and work to normalize  Please discontinue aspirin after 3 months but continue taking the plavix.  Please don't start smoking again Seek medical care or return if symptoms worsen or new problems develop.   Discharge Condition: STABLE   CODE STATUS: FULL  Diet recommendation: Heart Healthy  Brief/Interim Summary: HPI: [name] is a 71 y.o. female with history of stroke recently admitted and discharged to rehabilitation was brought to the hospital after patient was found to be confused and wandering in the mall yesterday afternoon. As per the daughter who provided the history patient was discharged from rehabilitation night before last occupation was confused. Yesterday patient was found to be wandering in the mall without footwear as per the daughter. Patient was involuntarily committed and brought to the ER.    ED Course: CT head was unremarkable. Since patient had a recent stroke neurologist was consulted and neurologist advised transfer to [facility] to get MRI and further stroke workup with MRI positive. On exam patient is moving all extremities 5 x 5. Still has decreased vision on the left side from previous stroke. Patient is well-oriented to time place and person.  Saidi is a 71 y.o. female with a history of recent stroke he was discharged on 6/5 to [facility]. She was found wandering outside the facility yesterday confused. The patient states that she just wasn't certain how to get home. Because of the confusion, she was involuntarily committed with concern  for psychiatric reasons for the confusion.  Due to concern for possible extension of stroke or other stroke related issues she was transferred to [facility]. A nurse here he was taking care of her prior to discharge states that she seems unchanged compared to her previous exam.   Neurology Note  Patient seen this morning by Dr. Amada Jupiter. I have discussed the patient with him directly. I have also reviewed her chart. She was recently discharged from [facility] on 10/20/16 following treatment of a R PCA ischemic infarct and is now re-admitted due to reported confusion at her SNF with family placing her on IVC. MRI ordered to assess for any new ischemia.   MRI brain has been completed. I have personally and independently reviewed this scan. I have also reviewed the radiologist's report and agree. This shows restricted diffusion in the R PCA territory and in the R cerebellum consistent with subacute ischemic infarctions. These show the normal expected radiographic evolution when compared to the previous MRI from 10/16/16. There is moderate diffuse chronic small vessel disease. Mild atrophy is present. No new areas of ischemia are seen. There are no acute abnormalities on this scan.   Per Dr. Amada Jupiter, the nursing staff on [facility] are familiar with the patient from her previous admission and indicated that she does not appear any different now than when she was discharged two days ago.   At this time, recommend ongoing supportive care and rehab services. No new neurologic issues and no new recommendations. Will sign off. Please call if any new issues should arise.   MDM/Assessment & Plan:   1. Acute encephalopathy -  resolved now, most likely secondary to effects of prior recent CVA and taking lorazepam which has been discontinued, pt has been back at her baseline with intermittent confusion. She was started on low dose seroquel 25 mg QHS.  Pt was seen by neurology and no new findings or recommendations.    Continue DAPT per previous neuro recommendations and follow up with Dr. Everlena Cooper.  Pt doing 3 months of DAPT then will do plavix daily.   social worker told me that patient is going to SNF where they have a locked unit for patients that have wandering tendencies.  She has been IVCd by her family.  2. Recent CVA -  affecting the vision on the left side - on DAPT for 3 months and statins which will be continued.  3. Hypertension on Cozaar and calcium channel blocker.  Titrate and adjust meds outpatient.  4. Chronic kidney disease stage 2 - stable, recheck BMP in 1-2 weeks. 5. COPD -stable, not actively wheezing.  6. Tobacco- Pt strongly advised to please do not restart smoking.    DVT prophylaxis:Lovenox.  Code Status:Full code. Family Communication:Patient's daughter. Disposition Plan:SNF Consults called:Neurology.  Discharge Diagnoses:  Principal Problem:   Acute encephalopathy Active Problems:   TOBACCO ABUSE   Essential hypertension   Chronic obstructive pulmonary disease (HCC)  Discharge Instructions  Discharge Instructions    Increase activity slowly    Complete by:  As directed      Allergies as of 10/23/2016      Reactions   Shrimp [shellfish Allergy] Hives, Swelling      Medication List    STOP taking these medications   diphenhydrAMINE 25 MG tablet Commonly known as:  BENADRYL   LORazepam 0.5 MG tablet Commonly known as:  ATIVAN   nicotine 21 mg/24hr patch Commonly known as:  NICODERM CQ - dosed in mg/24 hours   Vitamin D (Ergocalciferol) 50000 units Caps capsule Commonly known as:  DRISDOL     TAKE these medications   ADVAIR DISKUS 250-50 MCG/DOSE Aepb Generic drug:  Fluticasone-Salmeterol Inhale 1 puff into the lungs 2 (two) times daily.   albuterol 108 (90 Base) MCG/ACT inhaler Commonly known as:  PROVENTIL HFA;VENTOLIN HFA Inhale 2 puffs into the lungs every 4 (four) hours as needed for wheezing or shortness of breath.   aspirin 325 MG  tablet Take 1 tablet (325 mg total) by mouth daily.   clopidogrel 75 MG tablet Commonly known as:  PLAVIX Take 1 tablet (75 mg total) by mouth daily.   diltiazem 240 MG 24 hr capsule Commonly known as:  DILACOR XR Take 240 mg by mouth daily.   escitalopram 10 MG tablet Commonly known as:  LEXAPRO Take 10 mg by mouth daily.   losartan 100 MG tablet Commonly known as:  COZAAR Take 1 tablet by mouth daily.   polyethylene glycol packet Commonly known as:  MIRALAX / GLYCOLAX Take 17 g by mouth daily.   QUEtiapine 25 MG tablet Commonly known as:  SEROQUEL Take 1 tablet (25 mg total) by mouth at bedtime.   rosuvastatin 10 MG tablet Commonly known as:  CRESTOR Take 10 mg by mouth daily.   senna-docusate 8.6-50 MG tablet Commonly known as:  Senokot-S Take 1 tablet by mouth 2 (two) times daily.   SINGULAIR 10 MG tablet Generic drug:  montelukast Take 1 tablet by mouth daily.   thiamine 100 MG tablet Commonly known as:  VITAMIN B-1 Take 100 mg by mouth daily.   tiotropium 18 MCG inhalation  capsule Commonly known as:  SPIRIVA Place 18 mcg into inhaler and inhale daily.       Contact information for follow-up providers    Rinaldo Cloud, MD. Schedule an appointment as soon as possible for a visit in 2 week(s).   Specialty:  Cardiology Contact information: [address] [place] [address] [phone]        Drema Dallas, DO. Schedule an appointment as soon as possible for a visit in 4 week(s).   Specialty:  Neurology Why:  Hospital Follow Up  Contact information: [address] [place] [address] [phone]            Contact information for after-discharge care    Destination    [facility] Follow up.   Specialty:  Skilled Nursing Facility [address] [place] [address] [phone]                 Allergies  Allergen Reactions  . Shrimp  [Shellfish Allergy] Hives and Swelling     Procedures/Studies: Ct Head Wo Contrast  Result Date: 10/21/2016 CLINICAL DATA:  Patient is IVC and was transported via [employer]. Family is concerned about her well being, claims she is hearing voices, wandering outside, being aggressive toward family. EXAM: CT HEAD WITHOUT CONTRAST TECHNIQUE: Contiguous axial images were obtained from the base of the skull through the vertex without intravenous contrast. COMPARISON:  10/15/2016 FINDINGS: Brain: Subacute right occipital infarct as previously described. Stable hypo attenuation and right occipital white matter. No acute hemorrhage. Ventricles and sulci are normal in size and symmetry. No mass, mass effect, or midline shift. Vascular: No hyperdense vessel. Atherosclerotic and physiologic intracranial calcifications. Skull: Normal. Negative for fracture or focal lesion. Sinuses/Orbits: No acute finding. Other: None IMPRESSION: 1. Negative for bleed or other acute intracranial process. 2. Stable appearance of subacute right PCA distribution infarct. Electronically Signed   By: Corlis Leak M.D.   On: 10/21/2016 21:39   Ct Head Wo Contrast  Result Date: 10/15/2016 CLINICAL DATA:  Acute onset of headache and dizziness, after fall. Initial encounter. EXAM: CT HEAD WITHOUT CONTRAST TECHNIQUE: Contiguous axial images were obtained from the base of the skull through the vertex without intravenous contrast. COMPARISON:  CT of the head performed 10/13/2016 FINDINGS: Brain: There is an evolving acute infarct at the right occipital lobe, with associated mass-effect. There is no evidence of hemorrhagic transformation. No definite mass is identified. Scattered periventricular and subcortical white matter change likely reflects small vessel ischemic microangiopathy. The brainstem and fourth ventricle are within normal limits. The basal ganglia are unremarkable in appearance. No midline shift is seen. Vascular: No  hyperdense vessel or unexpected calcification. Skull: There is no evidence of fracture; visualized osseous structures are unremarkable in appearance. Sinuses/Orbits: The orbits are within normal limits. The paranasal sinuses and mastoid air cells are well-aerated. Other: No significant soft tissue abnormalities are seen. IMPRESSION: 1. Evolving acute infarct at the right occipital lobe, with associated mass effect. No evidence of hemorrhagic transformation. 2. Scattered small vessel ischemic microangiopathy. These results were called by telephone at the time of interpretation on 10/15/2016 at 10:59 pm to Dr. Gwyneth Sprout, who verbally acknowledged these results. Electronically Signed   By: Roanna Raider M.D.   On: 10/15/2016 23:01   Ct Head Wo Contrast  Result Date: 10/13/2016 CLINICAL DATA:  Acute onset severe headache EXAM: CT HEAD WITHOUT CONTRAST TECHNIQUE: Contiguous axial images were obtained  from the base of the skull through the vertex without intravenous contrast. COMPARISON:  03/19/2016, 08/06/2016 FINDINGS: Brain: Stable advanced chronic white matter microvascular ischemic changes throughout the cerebral hemispheres. No acute intracranial hemorrhage, mass lesion, definite new infarction, midline shift, herniation, hydrocephalus, or extra-axial fluid collection. Normal gray-white matter differentiation. No focal mass effect or edema. Cisterns are patent. No cerebellar abnormality. Vascular: No hyperdense vessel or unexpected calcification. Skull: Normal. Negative for fracture or focal lesion. Sinuses/Orbits: No acute finding. Other: None. IMPRESSION: Stable advanced chronic white matter microvascular ischemic changes. No interval change or acute process by noncontrast CT. Electronically Signed   By: Judie Petit.  Shick M.D.   On: 10/13/2016 09:20   Mr [name] Head Wo Contrast  Result Date: 10/16/2016 CLINICAL DATA:  Headaches and visual changes. EXAM: MRI HEAD WITHOUT CONTRAST MRA HEAD WITHOUT CONTRAST  TECHNIQUE: Multiplanar, multiecho pulse sequences of the brain and surrounding structures were obtained without intravenous contrast. Angiographic images of the head were obtained using MRA technique without contrast. COMPARISON:  1. Head CT 10/15/2016 2. Brain MRI 08/06/2016 FINDINGS: MRI HEAD FINDINGS Brain: The midline structures are normal. There is diffusion restriction throughout the right posterior cerebral artery distribution. There is a small focus of diffusion restriction in the left cerebellum measuring approximately 10 mm. The diffusion restriction extends into the lateral aspect of the right thalamus. There is associated hyperintense T2 weighted signal at the above-described locations. There is confluent hyperintense T2-weighted signal within the periventricular white matter, most often seen in the setting of chronic microvascular ischemia. No intraparenchymal hematoma or chronic microhemorrhage. Brain volume is normal for age without age-advanced or lobar predominant atrophy. The dura is normal and there is no extra-axial collection. Skull and upper cervical spine: The visualized skull base, calvarium, upper cervical spine and extracranial soft tissues are normal. Sinuses/Orbits: No fluid levels or advanced mucosal thickening. No mastoid effusion. Normal orbits. MRA HEAD FINDINGS The time-of-flight images are degraded by motion. Intracranial internal carotid arteries: Normal. Anterior cerebral arteries: Normal. Middle cerebral arteries: Multifocal atherosclerotic narrowing on the right. No advanced left MCA stenosis. Posterior communicating arteries: Absent bilaterally. Posterior cerebral arteries: There is loss of the normal flow related enhancement of the right posterior cerebral artery near the P2 P3 junction. Basilar artery: Normal. Vertebral arteries: Left dominant. Normal. Superior cerebellar arteries: Normal. Anterior inferior cerebellar arteries: Not clearly visualized. Posterior inferior  cerebellar arteries: Normal. IMPRESSION: 1. Acute ischemia throughout the right posterior cerebral artery distribution without hemorrhage or significant mass effect. 2. Small focus of acute ischemia in the left cerebellum. Bilateral distribution of lesions may indicate a central embolic process. 3. Severe stenosis or occlusion of the right posterior cerebral artery near the junction of the P2 and P3 segments. 4. Otherwise motion-degraded MRA without large vessel occlusion. Electronically Signed   By: Deatra Robinson M.D.   On: 10/16/2016 04:03   Mr Brain Wo Contrast  Result Date: 10/22/2016 CLINICAL DATA:  Initial evaluation for acute encephalopathy. EXAM: MRI HEAD WITHOUT CONTRAST TECHNIQUE: Multiplanar, multiecho pulse sequences of the brain and surrounding structures were obtained without intravenous contrast. COMPARISON:  Prior MRI from 10/16/2016. FINDINGS: Brain: Study moderately degraded by motion artifact. Generalized age-related cerebral atrophy again noted. Patchy T2/FLAIR hyperintensity throughout the periventricular and deep white matter both cerebral hemispheres most consistent with chronic small vessel ischemic disease, moderate to advanced in nature. Chronic microvascular ischemic changes present within the pons. There has been interval evolution of the previously identified large right PCA territory infarct, overall similar in distribution without  evidence for interval extension. Probable early changes related to laminar necrosis noted on T1 weighted sequence. No evidence for hemorrhagic transformation. Diffusion-weighted signal extending across the splenium suspected to be reactive in nature. Additional small left cerebellar infarct noted as well. No new acute intracranial infarct identified. Gray-white matter differentiation otherwise maintained. No other acute or chronic intracranial hemorrhage identified. No mass lesion, midline shift or mass effect. No hydrocephalus. No extra-axial fluid  collection. Major dural sinuses are patent. Pituitary gland suprasellar region within normal limits. Vascular: Major intracranial vascular flow voids maintained. Skull and upper cervical spine: Craniocervical junction within normal limits. Mild degenerate spondylolysis noted within the visualized upper cervical spine without significant stenosis. Bone marrow signal intensity normal. No scalp soft tissue abnormality. Sinuses/Orbits: Globes and orbital soft tissues within normal limits. Paranasal sinuses are clear. No mastoid effusion. Inner ear structures normal. IMPRESSION: 1. Normal expected interval evolution of right PCA and smaller left cerebellar infarcts. No evidence for interval extension or expansion of infarction. No hemorrhagic transformation or significant mass effect. 2. No other new intracranial process identified. 3. Stable atrophy with moderate to advanced chronic microvascular ischemic disease. Electronically Signed   By: Rise Mu M.D.   On: 10/22/2016 06:43   Mr Brain Wo Contrast  Result Date: 10/16/2016 CLINICAL DATA:  Headaches and visual changes. EXAM: MRI HEAD WITHOUT CONTRAST MRA HEAD WITHOUT CONTRAST TECHNIQUE: Multiplanar, multiecho pulse sequences of the brain and surrounding structures were obtained without intravenous contrast. Angiographic images of the head were obtained using MRA technique without contrast. COMPARISON:  1. Head CT 10/15/2016 2. Brain MRI 08/06/2016 FINDINGS: MRI HEAD FINDINGS Brain: The midline structures are normal. There is diffusion restriction throughout the right posterior cerebral artery distribution. There is a small focus of diffusion restriction in the left cerebellum measuring approximately 10 mm. The diffusion restriction extends into the lateral aspect of the right thalamus. There is associated hyperintense T2 weighted signal at the above-described locations. There is confluent hyperintense T2-weighted signal within the periventricular white  matter, most often seen in the setting of chronic microvascular ischemia. No intraparenchymal hematoma or chronic microhemorrhage. Brain volume is normal for age without age-advanced or lobar predominant atrophy. The dura is normal and there is no extra-axial collection. Skull and upper cervical spine: The visualized skull base, calvarium, upper cervical spine and extracranial soft tissues are normal. Sinuses/Orbits: No fluid levels or advanced mucosal thickening. No mastoid effusion. Normal orbits. MRA HEAD FINDINGS The time-of-flight images are degraded by motion. Intracranial internal carotid arteries: Normal. Anterior cerebral arteries: Normal. Middle cerebral arteries: Multifocal atherosclerotic narrowing on the right. No advanced left MCA stenosis. Posterior communicating arteries: Absent bilaterally. Posterior cerebral arteries: There is loss of the normal flow related enhancement of the right posterior cerebral artery near the P2 P3 junction. Basilar artery: Normal. Vertebral arteries: Left dominant. Normal. Superior cerebellar arteries: Normal. Anterior inferior cerebellar arteries: Not clearly visualized. Posterior inferior cerebellar arteries: Normal. IMPRESSION: 1. Acute ischemia throughout the right posterior cerebral artery distribution without hemorrhage or significant mass effect. 2. Small focus of acute ischemia in the left cerebellum. Bilateral distribution of lesions may indicate a central embolic process. 3. Severe stenosis or occlusion of the right posterior cerebral artery near the junction of the P2 and P3 segments. 4. Otherwise motion-degraded MRA without large vessel occlusion. Electronically Signed   By: Deatra Robinson M.D.   On: 10/16/2016 04:03      Subjective: Pt without complaints.  Had some dizziness overnight and some confusion.    Discharge  Exam: Vitals:   10/22/16 1300 10/23/16 0524  BP: (!) 148/102 (!) 144/98  Pulse: 89 85  Resp: 18 18  Temp:  98.7 F (37.1 C)    Vitals:   10/22/16 0842 10/22/16 0929 10/22/16 1300 10/23/16 0524  BP:  (!) 149/94 (!) 148/102 (!) 144/98  Pulse:  83 89 85  Resp:  16 18 18   Temp:   99 F (37.2 C) 98.7 F (37.1 C)  TempSrc:   Oral Oral  SpO2: 96% 95% 98% 95%  Weight:      Height:       General exam: awake, alert, oriented, NAD. Cooperative.  Respiratory system: Clear. No increased work of breathing. Cardiovascular system: S1 & S2 heard, RRR. No JVD, murmurs, gallops, clicks or pedal edema. Gastrointestinal system: Abdomen is nondistended, soft and nontender. Normal bowel sounds heard. Central nervous system: Alert and oriented.  Extremities: no CCE.  The results of significant diagnostics from this hospitalization (including imaging, microbiology, ancillary and laboratory) are listed below for reference.     Microbiology: Recent Results (from the past 240 hour(s))  MRSA PCR Screening     Status: Abnormal   Collection Time: 10/16/16  4:02 AM  Result Value Ref Range Status   MRSA by PCR POSITIVE (A) NEGATIVE Final    Comment:        The GeneXpert MRSA Assay (FDA approved for NASAL specimens only), is one component of a comprehensive MRSA colonization surveillance program. It is not intended to diagnose MRSA infection nor to guide or monitor treatment for MRSA infections. RESULT CALLED TO, READ BACK BY AND VERIFIED WITH: Adin HectorK. Therasa Lorenzi 0855 06.01.2018 N. MORRIS      Labs: BNP (last 3 results)  Recent Labs  08/30/16 2029  BNP 13.9   Basic Metabolic Panel:  Recent Labs Lab 10/17/16 0256 10/18/16 0825 10/19/16 0547 10/21/16 1950  NA 137 142 142 141  K 3.6 3.7 3.8 3.9  CL 109 115* 112* 108  CO2 21* 20* 21* 23  GLUCOSE 72 98 95 103*  BUN 13 8 7 17   CREATININE 1.29* 1.00 1.04* 1.50*  CALCIUM 9.3 10.0 9.9 10.7*   Liver Function Tests:  Recent Labs Lab 10/21/16 1950  AST 23  ALT 12*  ALKPHOS 62  BILITOT 0.7  PROT 8.0  ALBUMIN 4.4   No results for input(s): LIPASE, AMYLASE in the  last 168 hours.  Recent Labs Lab 10/21/16 2344  AMMONIA 19   CBC:  Recent Labs Lab 10/17/16 0256 10/18/16 0825 10/19/16 0547 10/21/16 1950  WBC 9.0 7.3 7.2 11.1*  NEUTROABS  --  4.9 4.6  --   HGB 11.9* 12.9 12.5 14.0  HCT 37.7 40.5 38.8 42.1  MCV 93.5 94.4 92.8 92.3  PLT 178 190 167 213   Cardiac Enzymes: No results for input(s): CKTOTAL, CKMB, CKMBINDEX, TROPONINI in the last 168 hours. BNP: Invalid input(s): POCBNP CBG: No results for input(s): GLUCAP in the last 168 hours. D-Dimer No results for input(s): DDIMER in the last 72 hours. Hgb A1c No results for input(s): HGBA1C in the last 72 hours. Lipid Profile No results for input(s): CHOL, HDL, LDLCALC, TRIG, CHOLHDL, LDLDIRECT in the last 72 hours. Thyroid function studies No results for input(s): TSH, T4TOTAL, T3FREE, THYROIDAB in the last 72 hours.  Invalid input(s): FREET3 Anemia work up No results for input(s): VITAMINB12, FOLATE, FERRITIN, TIBC, IRON, RETICCTPCT in the last 72 hours. Urinalysis    Component Value Date/Time   COLORURINE YELLOW 10/21/2016 2226   APPEARANCEUR  HAZY (A) 10/21/2016 2226   LABSPEC 1.019 10/21/2016 2226   PHURINE 5.0 10/21/2016 2226   GLUCOSEU NEGATIVE 10/21/2016 2226   HGBUR SMALL (A) 10/21/2016 2226   BILIRUBINUR NEGATIVE 10/21/2016 2226   KETONESUR 5 (A) 10/21/2016 2226   PROTEINUR 30 (A) 10/21/2016 2226   UROBILINOGEN 0.2 09/21/2014 1051   NITRITE NEGATIVE 10/21/2016 2226   LEUKOCYTESUR NEGATIVE 10/21/2016 2226   Sepsis Labs Invalid input(s): PROCALCITONIN,  WBC,  LACTICIDVEN Microbiology Recent Results (from the past 240 hour(s))  MRSA PCR Screening     Status: Abnormal   Collection Time: 10/16/16  4:02 AM  Result Value Ref Range Status   MRSA by PCR POSITIVE (A) NEGATIVE Final    Comment:        The GeneXpert MRSA Assay (FDA approved for NASAL specimens only), is one component of a comprehensive MRSA colonization surveillance program. It is not intended to  diagnose MRSA infection nor to guide or monitor treatment for MRSA infections. RESULT CALLED TO, READ BACK BY AND VERIFIED WITH: Adin Hector 0855 06.01.2018 N. MORRIS    Time coordinating discharge: 35 mins  SIGNED:  Standley Dakins, MD  [facility] 10/23/2016, 9:50 AM Pager [phone]  If 7PM-7AM, please contact night-coverage [email or web address] Password [email or web address]

## 2016-10-23 NOTE — Progress Notes (Signed)
Discharge to: [facility] Anticipated discharge date: 10/23/16 Family notified: Yes, by phone Transportation by: [facility]  CSW signing off.  Blenda Nicelylizabeth Nalini Alcaraz LCSW [phone]

## 2016-10-23 NOTE — Progress Notes (Signed)
Discharge orders received.  Discharge instructions and follow-up appointments reviewed with the patient.  VSS upon discharge.  IV removed and education complete.  [facility]  Transported via [facility].  Sondra ComeSilva, Heber Hoog M, RN

## 2016-10-29 ENCOUNTER — Ambulatory Visit: Payer: Medicare Other

## 2016-10-30 ENCOUNTER — Encounter: Payer: Self-pay | Admitting: Nurse Practitioner

## 2016-11-05 ENCOUNTER — Telehealth: Payer: Self-pay | Admitting: Cardiology

## 2016-11-05 NOTE — Telephone Encounter (Signed)
LMOVM requesting that pt send manual transmission b/c home monitor has not updated in at least 14 days.    

## 2016-11-12 ENCOUNTER — Encounter: Payer: Self-pay | Admitting: Cardiology

## 2016-11-16 ENCOUNTER — Emergency Department (HOSPITAL_COMMUNITY): Payer: Medicare Other

## 2016-11-16 ENCOUNTER — Encounter (HOSPITAL_COMMUNITY): Payer: Self-pay

## 2016-11-16 ENCOUNTER — Inpatient Hospital Stay (HOSPITAL_COMMUNITY)
Admission: EM | Admit: 2016-11-16 | Discharge: 2016-12-16 | DRG: 871 | Disposition: E | Payer: Medicare Other | Attending: Internal Medicine | Admitting: Internal Medicine

## 2016-11-16 DIAGNOSIS — I129 Hypertensive chronic kidney disease with stage 1 through stage 4 chronic kidney disease, or unspecified chronic kidney disease: Secondary | ICD-10-CM | POA: Diagnosis present

## 2016-11-16 DIAGNOSIS — K2901 Acute gastritis with bleeding: Secondary | ICD-10-CM | POA: Diagnosis present

## 2016-11-16 DIAGNOSIS — W19XXXA Unspecified fall, initial encounter: Secondary | ICD-10-CM | POA: Diagnosis present

## 2016-11-16 DIAGNOSIS — A419 Sepsis, unspecified organism: Principal | ICD-10-CM | POA: Diagnosis present

## 2016-11-16 DIAGNOSIS — K228 Other specified diseases of esophagus: Secondary | ICD-10-CM | POA: Diagnosis present

## 2016-11-16 DIAGNOSIS — D649 Anemia, unspecified: Secondary | ICD-10-CM | POA: Diagnosis present

## 2016-11-16 DIAGNOSIS — K254 Chronic or unspecified gastric ulcer with hemorrhage: Secondary | ICD-10-CM | POA: Diagnosis present

## 2016-11-16 DIAGNOSIS — I469 Cardiac arrest, cause unspecified: Secondary | ICD-10-CM

## 2016-11-16 DIAGNOSIS — M4800 Spinal stenosis, site unspecified: Secondary | ICD-10-CM | POA: Diagnosis present

## 2016-11-16 DIAGNOSIS — N179 Acute kidney failure, unspecified: Secondary | ICD-10-CM | POA: Diagnosis present

## 2016-11-16 DIAGNOSIS — Z515 Encounter for palliative care: Secondary | ICD-10-CM | POA: Diagnosis not present

## 2016-11-16 DIAGNOSIS — G936 Cerebral edema: Secondary | ICD-10-CM | POA: Diagnosis present

## 2016-11-16 DIAGNOSIS — Z66 Do not resuscitate: Secondary | ICD-10-CM | POA: Diagnosis not present

## 2016-11-16 DIAGNOSIS — Z79899 Other long term (current) drug therapy: Secondary | ICD-10-CM

## 2016-11-16 DIAGNOSIS — R402432 Glasgow coma scale score 3-8, at arrival to emergency department: Secondary | ICD-10-CM | POA: Diagnosis present

## 2016-11-16 DIAGNOSIS — I360 Nonrheumatic tricuspid (valve) stenosis: Secondary | ICD-10-CM | POA: Diagnosis not present

## 2016-11-16 DIAGNOSIS — Z981 Arthrodesis status: Secondary | ICD-10-CM

## 2016-11-16 DIAGNOSIS — G931 Anoxic brain damage, not elsewhere classified: Secondary | ICD-10-CM | POA: Diagnosis present

## 2016-11-16 DIAGNOSIS — J9601 Acute respiratory failure with hypoxia: Secondary | ICD-10-CM | POA: Diagnosis present

## 2016-11-16 DIAGNOSIS — F419 Anxiety disorder, unspecified: Secondary | ICD-10-CM | POA: Diagnosis present

## 2016-11-16 DIAGNOSIS — E876 Hypokalemia: Secondary | ICD-10-CM | POA: Diagnosis present

## 2016-11-16 DIAGNOSIS — F17210 Nicotine dependence, cigarettes, uncomplicated: Secondary | ICD-10-CM | POA: Diagnosis present

## 2016-11-16 DIAGNOSIS — J9602 Acute respiratory failure with hypercapnia: Secondary | ICD-10-CM | POA: Diagnosis present

## 2016-11-16 DIAGNOSIS — Z7902 Long term (current) use of antithrombotics/antiplatelets: Secondary | ICD-10-CM

## 2016-11-16 DIAGNOSIS — J449 Chronic obstructive pulmonary disease, unspecified: Secondary | ICD-10-CM | POA: Diagnosis present

## 2016-11-16 DIAGNOSIS — K210 Gastro-esophageal reflux disease with esophagitis: Secondary | ICD-10-CM | POA: Diagnosis present

## 2016-11-16 DIAGNOSIS — I468 Cardiac arrest due to other underlying condition: Secondary | ICD-10-CM | POA: Diagnosis present

## 2016-11-16 DIAGNOSIS — R296 Repeated falls: Secondary | ICD-10-CM | POA: Diagnosis present

## 2016-11-16 DIAGNOSIS — K922 Gastrointestinal hemorrhage, unspecified: Secondary | ICD-10-CM

## 2016-11-16 DIAGNOSIS — S065X9A Traumatic subdural hemorrhage with loss of consciousness of unspecified duration, initial encounter: Secondary | ICD-10-CM | POA: Diagnosis present

## 2016-11-16 DIAGNOSIS — R000 Tachycardia, unspecified: Secondary | ICD-10-CM | POA: Diagnosis present

## 2016-11-16 DIAGNOSIS — Z8249 Family history of ischemic heart disease and other diseases of the circulatory system: Secondary | ICD-10-CM

## 2016-11-16 DIAGNOSIS — J690 Pneumonitis due to inhalation of food and vomit: Secondary | ICD-10-CM | POA: Diagnosis present

## 2016-11-16 DIAGNOSIS — J9600 Acute respiratory failure, unspecified whether with hypoxia or hypercapnia: Secondary | ICD-10-CM | POA: Diagnosis not present

## 2016-11-16 DIAGNOSIS — N189 Chronic kidney disease, unspecified: Secondary | ICD-10-CM | POA: Diagnosis present

## 2016-11-16 DIAGNOSIS — E785 Hyperlipidemia, unspecified: Secondary | ICD-10-CM | POA: Diagnosis present

## 2016-11-16 DIAGNOSIS — J9690 Respiratory failure, unspecified, unspecified whether with hypoxia or hypercapnia: Secondary | ICD-10-CM

## 2016-11-16 DIAGNOSIS — R578 Other shock: Secondary | ICD-10-CM | POA: Diagnosis present

## 2016-11-16 DIAGNOSIS — R6521 Severe sepsis with septic shock: Secondary | ICD-10-CM | POA: Diagnosis present

## 2016-11-16 DIAGNOSIS — Z7982 Long term (current) use of aspirin: Secondary | ICD-10-CM

## 2016-11-16 DIAGNOSIS — R739 Hyperglycemia, unspecified: Secondary | ICD-10-CM | POA: Diagnosis present

## 2016-11-16 DIAGNOSIS — E872 Acidosis: Secondary | ICD-10-CM | POA: Diagnosis present

## 2016-11-16 DIAGNOSIS — I4581 Long QT syndrome: Secondary | ICD-10-CM | POA: Diagnosis present

## 2016-11-16 DIAGNOSIS — Z91013 Allergy to seafood: Secondary | ICD-10-CM

## 2016-11-16 DIAGNOSIS — Y92129 Unspecified place in nursing home as the place of occurrence of the external cause: Secondary | ICD-10-CM

## 2016-11-16 DIAGNOSIS — Z8673 Personal history of transient ischemic attack (TIA), and cerebral infarction without residual deficits: Secondary | ICD-10-CM

## 2016-11-16 LAB — CBC WITH DIFFERENTIAL/PLATELET
Basophils Absolute: 0 10*3/uL (ref 0.0–0.1)
Basophils Relative: 0 %
EOS ABS: 0.1 10*3/uL (ref 0.0–0.7)
EOS PCT: 1 %
HCT: 26.4 % — ABNORMAL LOW (ref 36.0–46.0)
Hemoglobin: 8.2 g/dL — ABNORMAL LOW (ref 12.0–15.0)
LYMPHS ABS: 1.2 10*3/uL (ref 0.7–4.0)
Lymphocytes Relative: 12 %
MCH: 30.4 pg (ref 26.0–34.0)
MCHC: 31.1 g/dL (ref 30.0–36.0)
MCV: 97.8 fL (ref 78.0–100.0)
MONO ABS: 0.3 10*3/uL (ref 0.1–1.0)
MONOS PCT: 3 %
Neutro Abs: 8.4 10*3/uL — ABNORMAL HIGH (ref 1.7–7.7)
Neutrophils Relative %: 84 %
PLATELETS: 145 10*3/uL — AB (ref 150–400)
RBC: 2.7 MIL/uL — ABNORMAL LOW (ref 3.87–5.11)
RDW: 14.6 % (ref 11.5–15.5)
WBC: 10 10*3/uL (ref 4.0–10.5)

## 2016-11-16 LAB — I-STAT ARTERIAL BLOOD GAS, ED
ACID-BASE DEFICIT: 7 mmol/L — AB (ref 0.0–2.0)
ACID-BASE DEFICIT: 8 mmol/L — AB (ref 0.0–2.0)
BICARBONATE: 22.1 mmol/L (ref 20.0–28.0)
BICARBONATE: 19.1 mmol/L — AB (ref 20.0–28.0)
O2 SAT: 100 %
O2 SAT: 92 %
PH ART: 7.105 — AB (ref 7.350–7.450)
TCO2: 24 mmol/L (ref 0–100)
TCO2: 21 mmol/L (ref 0–100)
pCO2 arterial: 70.4 mmHg (ref 32.0–48.0)
pCO2 arterial: 46.9 mmHg (ref 32.0–48.0)
pH, Arterial: 7.219 — ABNORMAL LOW (ref 7.350–7.450)
pO2, Arterial: 394 mmHg — ABNORMAL HIGH (ref 83.0–108.0)
pO2, Arterial: 78 mmHg — ABNORMAL LOW (ref 83.0–108.0)

## 2016-11-16 LAB — TROPONIN I: TROPONIN I: 0.04 ng/mL — AB (ref <0.03)

## 2016-11-16 LAB — COMPREHENSIVE METABOLIC PANEL
ALT: 13 U/L — ABNORMAL LOW (ref 14–54)
AST: 34 U/L (ref 15–41)
Albumin: 2.6 g/dL — ABNORMAL LOW (ref 3.5–5.0)
Alkaline Phosphatase: 43 U/L (ref 38–126)
Anion gap: 11 (ref 5–15)
BUN: 38 mg/dL — ABNORMAL HIGH (ref 6–20)
CO2: 22 mmol/L (ref 22–32)
Calcium: 8.8 mg/dL — ABNORMAL LOW (ref 8.9–10.3)
Chloride: 110 mmol/L (ref 101–111)
Creatinine, Ser: 2.67 mg/dL — ABNORMAL HIGH (ref 0.44–1.00)
GFR, EST AFRICAN AMERICAN: 20 mL/min — AB (ref >60)
GFR, EST NON AFRICAN AMERICAN: 17 mL/min — AB (ref >60)
Glucose, Bld: 217 mg/dL — ABNORMAL HIGH (ref 65–99)
Potassium: 4 mmol/L (ref 3.5–5.1)
SODIUM: 143 mmol/L (ref 135–145)
Total Bilirubin: 0.9 mg/dL (ref 0.3–1.2)
Total Protein: 5.2 g/dL — ABNORMAL LOW (ref 6.5–8.1)

## 2016-11-16 LAB — PREPARE FRESH FROZEN PLASMA
Unit division: 0
Unit division: 0

## 2016-11-16 LAB — PROTIME-INR
INR: 1.32
PROTHROMBIN TIME: 16.5 s — AB (ref 11.4–15.2)

## 2016-11-16 LAB — BPAM FFP
Blood Product Expiration Date: 201807032359
Blood Product Expiration Date: 201807122359
ISSUE DATE / TIME: 201807021901
ISSUE DATE / TIME: 201807021901
UNIT TYPE AND RH: 6200
Unit Type and Rh: 600

## 2016-11-16 LAB — I-STAT CG4 LACTIC ACID, ED: LACTIC ACID, VENOUS: 3.33 mmol/L — AB (ref 0.5–1.9)

## 2016-11-16 LAB — LIPASE, BLOOD: LIPASE: 19 U/L (ref 11–51)

## 2016-11-16 LAB — PREPARE RBC (CROSSMATCH)

## 2016-11-16 MED ORDER — ARFORMOTEROL TARTRATE 15 MCG/2ML IN NEBU
15.0000 ug | INHALATION_SOLUTION | Freq: Two times a day (BID) | RESPIRATORY_TRACT | Status: DC
  Administered 2016-11-17 – 2016-11-18 (×3): 15 ug via RESPIRATORY_TRACT
  Filled 2016-11-16 (×4): qty 2

## 2016-11-16 MED ORDER — MIDAZOLAM HCL 2 MG/2ML IJ SOLN: 1.0000 mg | INTRAMUSCULAR | Status: DC | PRN

## 2016-11-16 MED ORDER — FENTANYL CITRATE (PF) 100 MCG/2ML IJ SOLN: 50.0000 ug | INTRAMUSCULAR | Status: DC | PRN

## 2016-11-16 MED ORDER — NOREPINEPHRINE BITARTRATE 1 MG/ML IV SOLN
5.0000 ug/min | INTRAVENOUS | Status: DC
  Administered 2016-11-17: 5 ug/min via INTRAVENOUS
  Filled 2016-11-16 (×3): qty 4

## 2016-11-16 MED ORDER — DEXTROSE 5 % IV SOLN: 2.0000 g | Freq: Once | INTRAVENOUS | Status: DC

## 2016-11-16 MED ORDER — SODIUM CHLORIDE 0.9 % IV SOLN
8.0000 mg/h | INTRAVENOUS | Status: DC
  Administered 2016-11-16 – 2016-11-18 (×4): 8 mg/h via INTRAVENOUS
  Filled 2016-11-16 (×8): qty 80

## 2016-11-16 MED ORDER — SODIUM CHLORIDE 0.9 % IV SOLN: 250.0000 mL | INTRAVENOUS | Status: DC | PRN

## 2016-11-16 MED ORDER — VANCOMYCIN HCL IN DEXTROSE 1-5 GM/200ML-% IV SOLN: 1000.0000 mg | Freq: Once | INTRAVENOUS | Status: DC

## 2016-11-16 MED ORDER — STERILE WATER FOR INJECTION IV SOLN
INTRAVENOUS | Status: DC
  Administered 2016-11-17: via INTRAVENOUS
  Filled 2016-11-16 (×3): qty 850

## 2016-11-16 MED ORDER — LACTATED RINGERS IV SOLN
INTRAVENOUS | Status: DC
  Administered 2016-11-16: 23:00:00 via INTRAVENOUS

## 2016-11-16 MED ORDER — IPRATROPIUM-ALBUTEROL 0.5-2.5 (3) MG/3ML IN SOLN
3.0000 mL | Freq: Four times a day (QID) | RESPIRATORY_TRACT | Status: DC
  Administered 2016-11-17 – 2016-11-18 (×6): 3 mL via RESPIRATORY_TRACT
  Filled 2016-11-16 (×6): qty 3

## 2016-11-16 MED ORDER — VANCOMYCIN HCL IN DEXTROSE 1-5 GM/200ML-% IV SOLN
1000.0000 mg | Freq: Once | INTRAVENOUS | Status: AC
  Administered 2016-11-16: 1000 mg via INTRAVENOUS
  Filled 2016-11-16: qty 200

## 2016-11-16 MED ORDER — FENTANYL CITRATE (PF) 100 MCG/2ML IJ SOLN
50.0000 ug | INTRAMUSCULAR | Status: DC | PRN
Start: 2016-11-16 — End: 2016-11-18

## 2016-11-16 MED ORDER — VANCOMYCIN HCL IN DEXTROSE 1-5 GM/200ML-% IV SOLN: 1000.0000 mg | INTRAVENOUS | Status: DC

## 2016-11-16 MED ORDER — ORAL CARE MOUTH RINSE
15.0000 mL | Freq: Four times a day (QID) | OROMUCOSAL | Status: DC
  Administered 2016-11-17 – 2016-11-18 (×5): 15 mL via OROMUCOSAL

## 2016-11-16 MED ORDER — INSULIN ASPART 100 UNIT/ML ~~LOC~~ SOLN
2.0000 [IU] | SUBCUTANEOUS | Status: DC
  Administered 2016-11-17 (×2): 2 [IU] via SUBCUTANEOUS
  Administered 2016-11-17 (×2): 4 [IU] via SUBCUTANEOUS
  Administered 2016-11-17 – 2016-11-18 (×4): 2 [IU] via SUBCUTANEOUS

## 2016-11-16 MED ORDER — PIPERACILLIN-TAZOBACTAM 3.375 G IVPB 30 MIN
3.3750 g | Freq: Once | INTRAVENOUS | Status: AC
  Administered 2016-11-16: 3.375 g via INTRAVENOUS
  Filled 2016-11-16: qty 50

## 2016-11-16 MED ORDER — SODIUM CHLORIDE 0.9 % IV SOLN
10.0000 mL/h | Freq: Once | INTRAVENOUS | Status: AC
  Administered 2016-11-16: 10 mL/h via INTRAVENOUS

## 2016-11-16 MED ORDER — ACETAMINOPHEN 325 MG PO TABS: 650.0000 mg | ORAL_TABLET | ORAL | Status: DC | PRN

## 2016-11-16 MED ORDER — SODIUM CHLORIDE 0.9 % IV SOLN
80.0000 mg | Freq: Once | INTRAVENOUS | Status: AC
  Administered 2016-11-16: 80 mg via INTRAVENOUS
  Filled 2016-11-16: qty 80

## 2016-11-16 MED ORDER — NOREPINEPHRINE BITARTRATE 1 MG/ML IV SOLN
0.0000 ug/min | Freq: Once | INTRAVENOUS | Status: AC
  Administered 2016-11-16: 10 ug/min via INTRAVENOUS
  Filled 2016-11-16: qty 4

## 2016-11-16 MED ORDER — DEXTROSE 5 % IV SOLN
1.0000 g | INTRAVENOUS | Status: DC
  Administered 2016-11-17 – 2016-11-18 (×2): 1 g via INTRAVENOUS
  Filled 2016-11-16 (×2): qty 1

## 2016-11-16 MED ORDER — BUDESONIDE 0.5 MG/2ML IN SUSP
0.5000 mg | Freq: Two times a day (BID) | RESPIRATORY_TRACT | Status: DC
  Administered 2016-11-17 – 2016-11-18 (×3): 0.5 mg via RESPIRATORY_TRACT
  Filled 2016-11-16 (×4): qty 2

## 2016-11-16 MED ORDER — PROPOFOL 1000 MG/100ML IV EMUL: 0.0000 ug/kg/min | INTRAVENOUS | Status: DC

## 2016-11-16 MED ORDER — PANTOPRAZOLE SODIUM 40 MG IV SOLR
40.0000 mg | Freq: Two times a day (BID) | INTRAVENOUS | Status: DC
Start: 2016-11-20 — End: 2016-11-18

## 2016-11-16 MED ORDER — DOCUSATE SODIUM 50 MG/5ML PO LIQD
100.0000 mg | Freq: Two times a day (BID) | ORAL | Status: DC | PRN
  Filled 2016-11-16: qty 10

## 2016-11-16 MED ORDER — CHLORHEXIDINE GLUCONATE 0.12% ORAL RINSE (MEDLINE KIT)
15.0000 mL | Freq: Two times a day (BID) | OROMUCOSAL | Status: DC
  Administered 2016-11-17 – 2016-11-18 (×4): 15 mL via OROMUCOSAL

## 2016-11-16 MED ORDER — ONDANSETRON HCL 4 MG/2ML IJ SOLN: 4.0000 mg | Freq: Four times a day (QID) | INTRAMUSCULAR | Status: DC | PRN

## 2016-11-16 NOTE — ED Notes (Signed)
Total of 2500L [place] in.

## 2016-11-16 NOTE — Progress Notes (Addendum)
Pharmacy Antibiotic Note  [name] is a 71 y.o. female admitted on 12/07/2016 with AMS, SOB, coffee ground emesis and cardiac arrest.  Pharmacy has been consulted for vancomycin dosing for PNA.  Patient has AKI.   Plan:  - Vanc 1gm IV Q48H - Monitor renal fxn, clinical progress, vanc trough as indicated - F/U with continuation of Gram negative coverage   Height: 5\' 5"  (165.1 cm) (measured by RT) Weight: 135 lb (61.2 kg) IBW/kg (Calculated) : 57  Temp (24hrs), Avg:97 F (36.1 C), Min:97 F (36.1 C), Max:97 F (36.1 C)   Recent Labs Lab 12/12/2016 1930 11/29/2016 1933  WBC  --  10.0  CREATININE  --  2.67*  LATICACIDVEN 3.33*  --     Estimated Creatinine Clearance: 17.9 mL/min (A) (by C-G formula based on SCr of 2.67 mg/dL (H)).    Allergies  Allergen Reactions  . Shrimp [Shellfish Allergy] Hives and Swelling     Vanc 7/2 >> Zosyn    Fred Hammes D. Laney Potashang, PharmD, BCPS Pager:  [phone] 12/12/2016, 8:46 PM    ===============  Addendum: - add cefepime for HCAP; pt received Zosyn around 2100 - cefepime 1gm IV Q24H, start tomorrow AM - monitor renal fxn, clinical progress    Lorenzo Pereyra D. Laney Potashang, PharmD, BCPS Pager:  [phone] 12/07/2016, 10:19 PM

## 2016-11-16 NOTE — ED Notes (Signed)
2nd unit of emergency blood completed ([id number]).

## 2016-11-16 NOTE — Consult Note (Signed)
Reason for Consult:acute subdural hematoma Referring Physician: [name], [name] is an 71 y.o. female.  HPI: whom had an upper gi bleed, cardiac arrest outside the hospital, head injury, subdural hematoma on presentation to [facility]. I am asked to assess for surgical intervention  Past Medical History:  Diagnosis Date  . ALLERGIC RHINITIS   . Arthritis   . ASTHMA, UNSPECIFIED, UNSPECIFIED STATUS   . DYSLIPIDEMIA   . Headache(784.0)   . Hyperlipidemia 02/20/2009   Qualifier: Diagnosis of  By: Mackenzie Lente MD, [name] HYPERTENSION   . Hypertension   . Mild renal insufficiency   . PNA (pneumonia)   . Shortness of breath dyspnea   . SPINAL STENOSIS, LUMBAR    MRI 12/2005; surg 2004  . TOBACCO ABUSE     Past Surgical History:  Procedure Laterality Date  . ANTERIOR LAT LUMBAR FUSION Right 12/30/2015   Procedure: Lumbar two-three, Lumbar three-four Anterior Lateral Lumbar Interbody Fusion with Anterior column realignment at Lumbar three-four;  Surgeon: Mackenzie Ny Ditty, MD;  Location: [place] [facility];  Service: Neurosurgery;  Laterality: Right;  . Closure of bronchopleural fistula, right lower lobe.    [name] COLONOSCOPY    . Decompressive Laminectomy     Fusion L4-5, L5-S1 ([facility]-2004)  . Decortication of right lower lobe    . LOOP RECORDER INSERTION N/A 10/19/2016   Procedure: Loop Recorder Insertion;  Surgeon: Mackenzie Sprang, MD;  Location: [place] CV LAB;  Service: Cardiovascular;  Laterality: N/A;  . Placement of wound On-Q analgesia irrigation system.    [name] POSTERIOR LUMBAR FUSION 4 LEVEL N/A 12/30/2015   Procedure: Lumbar two -Sacral one Dorsal Internal Fixation and Fusion, Charline Bills osteotomies Lumbar two-three, Lumbar three-four;  Surgeon: Mackenzie Ny Ditty, MD;  Location: [place] [facility];  Service: Neurosurgery;  Laterality: N/A;  . Right hand  1995  . Right knee  1990  . Right video-assisted thoracoscopic surgery, drainage of empyema.  02/12/2011  .  TEE WITHOUT CARDIOVERSION N/A 10/19/2016   Procedure: TRANSESOPHAGEAL ECHOCARDIOGRAM (TEE);  Surgeon: Mackenzie Dials, MD;  Location: [facility] ENDOSCOPY;  Service: Cardiovascular;  Laterality: N/A;    Family History  Problem Relation Age of Onset  . Mental illness Mother   . Hypertension Mother   . Arthritis Other   . Hypertension Other     Social History:  reports that she has been smoking Cigarettes.  She has a 10.00 pack-year smoking history. She has never used smokeless tobacco. She reports that she does not drink alcohol or use drugs.  Allergies:  Allergies  Allergen Reactions  . Shrimp [Shellfish Allergy] Hives and Swelling    Medications: I have reviewed the patient's current medications.  Results for orders placed or performed during the hospital encounter of 11/15/2016 (from the past 48 hour(s))  Prepare fresh frozen plasma     Status: None   Collection Time: 12/02/2016  6:58 PM  Result Value Ref Range   Unit Number [id number]    Blood Component Type THAWED PLASMA    Unit division 00    Status of Unit REL FROM [facility]    Unit tag comment VERBAL ORDERS PER DR CAMPOS    Transfusion Status OK TO TRANSFUSE    Unit Number [id number]    Blood Component Type LIQ PLASMA    Unit division 00    Status of Unit REL FROM [facility]    Unit tag comment VERBAL ORDERS PER DR CAMPOS    Transfusion Status OK  TO TRANSFUSE   Type and screen [place]     Status: None (Preliminary result)   Collection Time: 11/24/2016  7:01 PM  Result Value Ref Range   ABO/RH(D) O POS    Antibody Screen NEG    Sample Expiration 11/19/2016    Unit Number [id number]    Blood Component Type RBC, LR IRR    Unit division 00    Status of Unit ISSUED    Transfusion Status OK TO TRANSFUSE    Crossmatch Result COMPATIBLE    Unit tag comment VERBAL ORDERS PER DR CAMPOS    Unit Number [id number]    Blood Component Type RBC, LR IRR    Unit division 00    Status of Unit ISSUED    Transfusion  Status OK TO TRANSFUSE    Crossmatch Result COMPATIBLE    Unit tag comment VERBAL ORDERS PER DR CAMPOS    Unit Number [id number]    Blood Component Type RED CELLS,LR    Unit division 00    Status of Unit ISSUED    Unit tag comment VERBAL ORDERS PER DR CAMPOS    Transfusion Status OK TO TRANSFUSE    Crossmatch Result COMPATIBLE    Unit Number [id number]    Blood Component Type RED CELLS,LR    Unit division 00    Status of Unit ISSUED    Unit tag comment VERBAL ORDERS PER DR CAMPOS    Transfusion Status OK TO TRANSFUSE    Crossmatch Result COMPATIBLE   I-Stat CG4 Lactic Acid, ED     Status: Abnormal   Collection Time: 11/21/2016  7:30 PM  Result Value Ref Range   Lactic Acid, Venous 3.33 (HH) 0.5 - 1.9 mmol/L   Comment NOTIFIED PHYSICIAN   CBC with Differential/Platelet     Status: Abnormal   Collection Time: 12/09/2016  7:33 PM  Result Value Ref Range   WBC 10.0 4.0 - 10.5 K/uL   RBC 2.70 (L) 3.87 - 5.11 MIL/uL   Hemoglobin 8.2 (L) 12.0 - 15.0 g/dL   HCT 26.4 (L) 36.0 - 46.0 %   MCV 97.8 78.0 - 100.0 fL   MCH 30.4 26.0 - 34.0 pg   MCHC 31.1 30.0 - 36.0 g/dL   RDW 14.6 11.5 - 15.5 %   Platelets 145 (L) 150 - 400 K/uL   Neutrophils Relative % 84 %   Neutro Abs 8.4 (H) 1.7 - 7.7 K/uL   Lymphocytes Relative 12 %   Lymphs Abs 1.2 0.7 - 4.0 K/uL   Monocytes Relative 3 %   Monocytes Absolute 0.3 0.1 - 1.0 K/uL   Eosinophils Relative 1 %   Eosinophils Absolute 0.1 0.0 - 0.7 K/uL   Basophils Relative 0 %   Basophils Absolute 0.0 0.0 - 0.1 K/uL  Comprehensive metabolic panel     Status: Abnormal   Collection Time: 12/04/2016  7:33 PM  Result Value Ref Range   Sodium 143 135 - 145 mmol/L   Potassium 4.0 3.5 - 5.1 mmol/L   Chloride 110 101 - 111 mmol/L   CO2 22 22 - 32 mmol/L   Glucose, Bld 217 (H) 65 - 99 mg/dL   BUN 38 (H) 6 - 20 mg/dL   Creatinine, Ser 2.67 (H) 0.44 - 1.00 mg/dL   Calcium 8.8 (L) 8.9 - 10.3 mg/dL   Total Protein 5.2 (L) 6.5 - 8.1 g/dL   Albumin 2.6 (L)  3.5 - 5.0 g/dL   AST 34 15 - 41 U/L  ALT 13 (L) 14 - 54 U/L   Alkaline Phosphatase 43 38 - 126 U/L   Total Bilirubin 0.9 0.3 - 1.2 mg/dL   GFR calc non Af Amer 17 (L) >60 mL/min   GFR calc Af Amer 20 (L) >60 mL/min    Comment: (NOTE) The eGFR has been calculated using the CKD EPI equation. This calculation has not been validated in all clinical situations. eGFR's persistently <60 mL/min signify possible Chronic Kidney Disease.    Anion gap 11 5 - 15  Lipase, blood     Status: None   Collection Time: 12/07/2016  7:33 PM  Result Value Ref Range   Lipase 19 11 - 51 U/L  Troponin I     Status: Abnormal   Collection Time: 12/14/2016  7:33 PM  Result Value Ref Range   Troponin I 0.04 (HH) <0.03 ng/mL    Comment: CRITICAL RESULT CALLED TO, READ BACK BY AND VERIFIED WITH: GLOUSTER,J RN 11/19/2016 2037 JORDANS   Protime-INR     Status: Abnormal   Collection Time: 12/08/2016  7:59 PM  Result Value Ref Range   Prothrombin Time 16.5 (H) 11.4 - 15.2 seconds   INR 1.32   Prepare RBC     Status: None   Collection Time: 11/24/2016  8:01 PM  Result Value Ref Range   Order Confirmation ORDER PROCESSED BY BLOOD BANK   I-Stat Arterial Blood Gas, ED - (order at [facility] and [facility] only)     Status: Abnormal   Collection Time: 12/03/2016  8:08 PM  Result Value Ref Range   pH, Arterial 7.105 (LL) 7.350 - 7.450   pCO2 arterial 70.4 (HH) 32.0 - 48.0 mmHg   pO2, Arterial 394.0 (H) 83.0 - 108.0 mmHg   Bicarbonate 22.1 20.0 - 28.0 mmol/L   TCO2 24 0 - 100 mmol/L   O2 Saturation 100.0 %   Acid-base deficit 7.0 (H) 0.0 - 2.0 mmol/L   Patient temperature 98.6 F    Collection site RADIAL, ALLEN'S TEST ACCEPTABLE    Drawn by Operator    Sample type ARTERIAL    Comment NOTIFIED PHYSICIAN     Ct Head Wo Contrast  Result Date: 12/08/2016 CLINICAL DATA:  Found down. Agonal breathing. History of RIGHT posterior cerebral artery territory infarct, hypertension and dyslipidemia. EXAM: CT HEAD WITHOUT CONTRAST TECHNIQUE:  Contiguous axial images were obtained from the base of the skull through the vertex without intravenous contrast. COMPARISON:  MRI of the head October 22, 2016 and CT HEAD October 21, 2016 FINDINGS: BRAIN: Dense 10 mm RIGHT holo hemispheric subdural hematoma. Dense 5 mm falcotentorial subdural hematoma. 6 mm RIGHT to LEFT midline shift. RIGHT lateral ventricle effacement without entrapment. Global edema with basal cistern effacement. Subacute to chronic RIGHT temporal occipital lobe infarct with probable petechial hemorrhage. Subacute to old RIGHT thalamus and RIGHT splenium of the corpus callosum infarcts better seen on prior MRI. No intraparenchymal lobar hematoma. Patchy white matter changes compatible chronic small vessel ischemic disease better demonstrated on prior MRI. VASCULAR: Mild calcific atherosclerosis of the carotid siphons. SKULL: No skull fracture. Small to moderate LEFT frontoparietal subdural hematoma. SINUSES/ORBITS: Mild paranasal sinus mucosal thickening without air-fluid levels. Mastoid air cells are well aerated.The included ocular globes and orbital contents are non-suspicious. OTHER: None. IMPRESSION: 1. Acute to subacute RIGHT 10 mm holo hemispheric subdural hematoma and, 5 mm similar aged falcotentorial subdural hematoma. 6 mm RIGHT to LEFT midline shift without entrapment. LEFT frontoparietal scalp hematoma, suggesting traumatic etiology . 2. Global edema,  effaced basal cisterns. 3. Subacute to chronic RIGHT PCA territory (RIGHT thalamus and RIGHT temporal occipital lobe) infarcts with petechial hemorrhage . Critical Value/emergent results were called by telephone at the time of interpretation on 11/15/2016 at 9:25 pm to Dr. Jola Schmidt , who verbally acknowledged these results. Electronically Signed   By: Elon Alas M.D.   On: 11/28/2016 21:36   Dg Chest Portable 1 View  Result Date: 12/14/2016 CLINICAL DATA:  Central line placement EXAM: PORTABLE CHEST 1 VIEW COMPARISON:  November 16, 2016,  6:59 p.m. FINDINGS: Left central venous line is identified with distal tip in the superior vena cava. Endotracheal tube is identified with distal tip 1.7 cm from carina. Nasogastric tube is identified with distal tip not included on the film but is at least in the stomach. Coarse right perihilar opacities are identified. There is increased pulmonary interstitium bilaterally. There is no pneumothorax. The heart size is normal. IMPRESSION: Left central venous line distal tip in the superior vena cava. There is no pneumothorax. Pulmonary edema. Endotracheal tube and nasogastric tube as described. Electronically Signed   By: Abelardo Diesel M.D.   On: 12/14/2016 20:06   Dg Chest Portable 1 View  Result Date: 11/25/2016 CLINICAL DATA:  Post intubation.  Upper GI bleed. EXAM: PORTABLE CHEST 1 VIEW COMPARISON:  Radiograph 08/30/2016 FINDINGS: Endotracheal tube 2.1 cm from the carina. Enteric tube in place, tip below the diaphragm in the stomach, side-port not well visualized. Heart is normal in size, there is tortuosity and atherosclerosis of the thoracic aorta. Lower lung volumes from prior exam. Irregular coarse right perihilar opacities. No confluent airspace disease. Chronic blunting of right costophrenic angle. No pneumothorax. Remote right posterolateral rib fracture. Loop recorder projects over the left chest wall. IMPRESSION: 1. Endotracheal tube 2.1 cm from the carina. Enteric tube in place, tip below the diaphragm in the stomach, side-port not well visualized. 2. Irregular coarse right perihilar opacities may be asymmetric pulmonary edema, pneumonia, atelectasis or aspiration. Electronically Signed   By: Jeb Levering M.D.   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 rate (!) 21, height  '5\' 5"'  (1.651 m), weight 61.2 kg (135 lb), SpO2 97 %. Physical Exam  Constitutional: She appears well-developed and well-nourished.  Intubated, not responsive. No sedation needed.  Respiratory:  On ventilator  Neurological: She is unresponsive. A cranial nerve deficit is present.  Comatose, no response to peripheral noxious stimuli Cannot assess motor strength Did not assess gait Pupils fixed, dilated, not responsive to light No oculocephalics No corneals +cough    Assessment/Plan: Do not believe this is a survivable injury. There is no indication for surgery as it would be futile. I have explained this to the family and have answered their questions.   [name] L 11/19/2016, 10:09 PM

## 2016-11-16 NOTE — ED Notes (Signed)
Called blood bank, sent someone to get two more units.

## 2016-11-16 NOTE — ED Notes (Signed)
ED Provider at bedside w/ family.

## 2016-11-16 NOTE — Progress Notes (Signed)
-   Received page for consultation for coffee ground emesis. [facility] for AMS and SOB. Patient had cardiac arrest requiring 6 to 10 minutes of CPR. Currently Intubated. CT head showed subdural Hematoma with midline shift. Case D/W ED physician. Patient not having active bleeding at this time. Continue Protonix drip. Possible EGD tomorrow  depending on overall clinical status if evidence of ongoing bleeding.    Kathi DerParag Tanishia Lemaster MD, FACP 11/29/2016, 9:53 PM  Pager [phone]  If no answer or after 5 PM call [phone]

## 2016-11-16 NOTE — ED Notes (Signed)
I Stat Lactic Acid results shown to Dr. Patria Maneampos

## 2016-11-16 NOTE — H&P (Signed)
PULMONARY / CRITICAL CARE MEDICINE   Name: [name] MRN: [id number] DOB: 1946/02/04    ADMISSION DATE:  12/15/2016 CONSULTATION DATE:  11/25/2016  REFERRING MD:  Dr Verdie Mosher  CHIEF COMPLAINT:  Septic shock; Cardiac arrest; GIB; SDH  HISTORY OF PRESENT ILLNESS:   71yoF with Asthma/COPD, HTN, CKD, Spinal stenosis, CVA, Anxiety, Tobacco abuse, presented to ER today from nursing home where patient found down, surrounded by coffee ground emesis, with AMS and SOB. EMS was called and was taking her to the hospital when she developed cardiac arrest (PEA) en-route requiring CPR before attaining ROSC. On arrival to the ER she was found to be in profound shock (BP 41/30) with moderate amount of coffee grounds on OG tube. In the ER she was given 4u pRBC and 4L NS, Vancomycin, Zosyn. Central line placed by ER and Levophed started.   At the time of my exam, 2 of patient's daughters are at the bedside and report patient was last normal on Friday but was noted to have slurred speech on Sunday when they saw her. They also said she has been falling a lot and has been weak and not eating well. They said she has not been c/o CP or SOB or Abd pain to them. They were on their way into the nursing home to see her today when EMS passed them, transporting patient to the hospital.    PAST MEDICAL HISTORY :  She  has a past medical history of ALLERGIC RHINITIS; Arthritis; ASTHMA, UNSPECIFIED, UNSPECIFIED STATUS; DYSLIPIDEMIA; Headache(784.0); Hyperlipidemia (02/20/2009); HYPERTENSION; Hypertension; Mild renal insufficiency; PNA (pneumonia); Shortness of breath dyspnea; SPINAL STENOSIS, LUMBAR; and TOBACCO ABUSE.  PAST SURGICAL HISTORY:  She  has a past surgical history that includes Decompressive Laminectomy; Right hand (1995); Right knee (1990); Right video-assisted thoracoscopic surgery, drainage of empyema. (02/12/2011); Decortication of right lower lobe; Closure of bronchopleural fistula, right lower lobe.;  Placement of wound On-Q analgesia irrigation system.; Colonoscopy; Anterior lat lumbar fusion (Right, 12/30/2015); Posterior lumbar fusion 4 level (N/A, 12/30/2015); Loop Recorder Insertion (N/A, 10/19/2016); and TEE without cardioversion (N/A, 10/19/2016).  Allergies  Allergen Reactions  . Shrimp [Shellfish Allergy] Hives and Swelling    No current facility-administered medications on file prior to encounter.    Current Outpatient Prescriptions on File Prior to Encounter  Medication Sig  . albuterol (PROVENTIL HFA;VENTOLIN HFA) 108 (90 Base) MCG/ACT inhaler Inhale 2 puffs into the lungs every 4 (four) hours as needed for wheezing or shortness of breath.  [name] aspirin 325 MG tablet Take 1 tablet (325 mg total) by mouth daily.  . clopidogrel (PLAVIX) 75 MG tablet Take 1 tablet (75 mg total) by mouth daily.  [name] diltiazem (DILACOR XR) 240 MG 24 hr capsule Take 240 mg by mouth daily.  [name] escitalopram (LEXAPRO) 10 MG tablet Take 10 mg by mouth daily.  . Fluticasone-Salmeterol (ADVAIR DISKUS) 250-50 MCG/DOSE AEPB Inhale 1 puff into the lungs 2 (two) times daily.    [name] losartan (COZAAR) 100 MG tablet Take 100 mg by mouth daily.   . polyethylene glycol (MIRALAX / GLYCOLAX) packet Take 17 g by mouth daily.  . QUEtiapine (SEROQUEL) 25 MG tablet Take 1 tablet (25 mg total) by mouth at bedtime.  . rosuvastatin (CRESTOR) 10 MG tablet Take 10 mg by mouth daily.   [name] senna-docusate (SENOKOT-S) 8.6-50 MG tablet Take 1 tablet by mouth 2 (two) times daily.  [name] SINGULAIR 10 MG tablet Take 1 tablet by mouth daily.  [name] thiamine (VITAMIN B-1) 100 MG  tablet Take 100 mg by mouth daily.  [name] tiotropium (SPIRIVA) 18 MCG inhalation capsule Place 18 mcg into inhaler and inhale daily.      FAMILY HISTORY:  Her indicated that her mother is deceased. She indicated that her father is deceased. She indicated that her sister is alive. She indicated that the status of her other is unknown.    SOCIAL HISTORY: She  reports that she has  been smoking Cigarettes.  She has a 10.00 pack-year smoking history. She has never used smokeless tobacco. She reports that she does not drink alcohol or use drugs.  REVIEW OF SYSTEMS:   Review of Systems  Unable to perform ROS: Critical illness   VITAL SIGNS: BP 105/76   Pulse (!) 59   Temp 97 F (36.1 C) (Temporal)   Resp (!) 21   Ht 5\' 5"  (1.651 m) Comment: measured by RT  Wt 61.2 kg (135 lb)   SpO2 97%   BMI 22.47 kg/m   HEMODYNAMICS:  Levophed @  VENTILATOR SETTINGS: Vent Mode: PRVC FiO2 (%):  [50 %-100 %] 50 % Set Rate:  [16 bmp-26 bmp] 26 bmp Vt Set:  [460 mL] 460 mL PEEP:  [5 cmH20] 5 cmH20 Plateau Pressure:  [23 cmH20] 23 cmH20  INTAKE / OUTPUT: No intake/output data recorded.  PHYSICAL EXAMINATION: General: Elderly AAF, intubated, unresponsive but not sedated, critically ill Neuro: No response to sternal rub, Pupils fixed and dilated, No gag on my exam but reportedly was a gag when ER intubated her.  HEENT: OP clear; OG tube with small amount of coffee grounds actively being suctioned Cardiovascular:  RRR, no m/r/g Lungs: Coarse breath sounds b/l Abdomen: soft upper abdomen, firm lower abdomen presumably from distended bladder; no foley in place Musculoskeletal: 1+ BLE edema Skin: no rashes   LABS:  BMET  Recent Labs Lab 12/12/2016 1933  NA 143  K 4.0  CL 110  CO2 22  BUN 38*  CREATININE 2.67*  GLUCOSE 217*   Electrolytes  Recent Labs Lab 12/01/2016 1933  CALCIUM 8.8*   CBC  Recent Labs Lab 11/15/2016 1933  WBC 10.0  HGB 8.2*  HCT 26.4*  PLT 145*   Coag's  Recent Labs Lab 11/30/2016 1959  INR 1.32   Sepsis Markers  Recent Labs Lab 12/08/2016 1930  LATICACIDVEN 3.33*   ABG  Recent Labs Lab 11/30/2016 2008  PHART 7.105*  PCO2ART 70.4*  PO2ART 394.0*   Liver Enzymes  Recent Labs Lab 12/02/2016 1933  AST 34  ALT 13*  ALKPHOS 43  BILITOT 0.9  ALBUMIN 2.6*   Cardiac Enzymes  Recent Labs Lab 12/04/2016 1933   TROPONINI 0.04*   Glucose No results for input(s): GLUCAP in the last 168 hours.  Imaging Ct Head Wo Contrast  Result Date: 11/24/2016 CLINICAL DATA:  Found down. Agonal breathing. History of RIGHT posterior cerebral artery territory infarct, hypertension and dyslipidemia. EXAM: CT HEAD WITHOUT CONTRAST TECHNIQUE: Contiguous axial images were obtained from the base of the skull through the vertex without intravenous contrast. COMPARISON:  MRI of the head October 22, 2016 and CT HEAD October 21, 2016 FINDINGS: BRAIN: Dense 10 mm RIGHT holo hemispheric subdural hematoma. Dense 5 mm falcotentorial subdural hematoma. 6 mm RIGHT to LEFT midline shift. RIGHT lateral ventricle effacement without entrapment. Global edema with basal cistern effacement. Subacute to chronic RIGHT temporal occipital lobe infarct with probable petechial hemorrhage. Subacute to old RIGHT thalamus and RIGHT splenium of the corpus callosum infarcts better seen on prior MRI.  No intraparenchymal lobar hematoma. Patchy white matter changes compatible chronic small vessel ischemic disease better demonstrated on prior MRI. VASCULAR: Mild calcific atherosclerosis of the carotid siphons. SKULL: No skull fracture. Small to moderate LEFT frontoparietal subdural hematoma. SINUSES/ORBITS: Mild paranasal sinus mucosal thickening without air-fluid levels. Mastoid air cells are well aerated.The included ocular globes and orbital contents are non-suspicious. OTHER: None. IMPRESSION: 1. Acute to subacute RIGHT 10 mm holo hemispheric subdural hematoma and, 5 mm similar aged falcotentorial subdural hematoma. 6 mm RIGHT to LEFT midline shift without entrapment. LEFT frontoparietal scalp hematoma, suggesting traumatic etiology . 2. Global edema, effaced basal cisterns. 3. Subacute to chronic RIGHT PCA territory (RIGHT thalamus and RIGHT temporal occipital lobe) infarcts with petechial hemorrhage . Critical Value/emergent results were called by telephone at the time  of interpretation on 12/07/2016 at 9:25 pm to Dr. Azalia Bilis , who verbally acknowledged these results. Electronically Signed   By: Awilda Metro M.D.   On: 11/26/2016 21:36   Dg Chest Portable 1 View  Result Date: 11/15/2016 CLINICAL DATA:  Central line placement EXAM: PORTABLE CHEST 1 VIEW COMPARISON:  November 16, 2016, 6:59 p.m. FINDINGS: Left central venous line is identified with distal tip in the superior vena cava. Endotracheal tube is identified with distal tip 1.7 cm from carina. Nasogastric tube is identified with distal tip not included on the film but is at least in the stomach. Coarse right perihilar opacities are identified. There is increased pulmonary interstitium bilaterally. There is no pneumothorax. The heart size is normal. IMPRESSION: Left central venous line distal tip in the superior vena cava. There is no pneumothorax. Pulmonary edema. Endotracheal tube and nasogastric tube as described. Electronically Signed   By: Sherian Rein M.D.   On: 11/15/2016 20:06   Dg Chest Portable 1 View  Result Date: 12/12/2016 CLINICAL DATA:  Post intubation.  Upper GI bleed. EXAM: PORTABLE CHEST 1 VIEW COMPARISON:  Radiograph 08/30/2016 FINDINGS: Endotracheal tube 2.1 cm from the carina. Enteric tube in place, tip below the diaphragm in the stomach, side-port not well visualized. Heart is normal in size, there is tortuosity and atherosclerosis of the thoracic aorta. Lower lung volumes from prior exam. Irregular coarse right perihilar opacities. No confluent airspace disease. Chronic blunting of right costophrenic angle. No pneumothorax. Remote right posterolateral rib fracture. Loop recorder projects over the left chest wall. IMPRESSION: 1. Endotracheal tube 2.1 cm from the carina. Enteric tube in place, tip below the diaphragm in the stomach, side-port not well visualized. 2. Irregular coarse right perihilar opacities may be asymmetric pulmonary edema, pneumonia, atelectasis or aspiration. Electronically  Signed   By: Rubye Oaks M.D.   On: 12/14/2016 19:23   STUDIES:  Head CT: acute to subacute right SDH; 6mm Right to Left midline shift; global edema Brain MRI: pending  ANTIBIOTICS: S/p 1 dose Vanc and 1 dose Zosyn in ER  LINES/TUBES: Left Subclavian TLC (placed in ER) 7.62mm ETT  ASSESSMENT / PLAN: 71yoF with Asthma/COPD, HTN, CKD, Spinal stenosis, CVA, Anxiety, Tobacco abuse, presented to ER today from nursing home where patient found down, surrounded by coffee ground emesis, with AMS and SOB, had cardiac arrest in route to the ER. Now with septic shock due to aspiration pneumonia, Upper GIB, SDH, and Signs of anoxic brain injury.   PULMONARY 1. Acute hypoxic and Acute hypercapneic respiratory failure; Aspiration pneumonia; Septic shock: - continue mechanical ventilation; repeat ABG now shows 7.21/46/78/19. Increased Vt from 460 to 500. - obtain sputum, blood, and urine cultures - s/p  Vanc/Zosyn in ER; continue Vanc/Cefepime - lactic acid elevated at 3.33; continue to trend it - check procalcitonin - s/p 4L IVF in ER; continue Levophed and wean as tolerated - GI prophylaxis; SCD's for DVT prophylaxis. - NPO  2. Asthma/COPD:  - on spiriva, albuterol, and symbicort at home - start duonebs q6 and pulmicort/brovana nebs BID here  CARDIOVASCULAR 1. Cardiac arrest: - out of hospital cardiac arrest (8139m in PEA), s/p ROSC - not a candidate for therapeutic hypothermia given the GIB and SDH and Sepsis - TTE in AM  2. History of HTN: currently in shock; hold home BP meds   RENAL 1. AKI-on-CKD; Metabolic acidosis: - creatinine 2.67 up from a baseline of 1.0-1.5; likely related to ATN from the shock - place foley catheter  - continue IVF's; monitor strict I/O's  GASTROINTESTINAL 1. UGIB: - unclear quantity; reportedly was surrounded by coffee grounds at the scene, now with only small amount of active bleeding which is still coffee ground in nature - Hgb dropped from baseline  12.5-14 to now 8.2; s/p 4u pRBC in ER - recheck Hgb - Continue PPI gtt - GI has been consulted and may perform EGD in the AM depending on how patient is doing  HEMATOLOGIC 1. Anemia: see plan above   INFECTIOUS 1. Aspiration pneumonia: see plan above   ENDOCRINE 1. Hyperglycemia: - no history of DM; Blood glucose 217 here - start accuchecks and SSI  NEUROLOGIC 1. Acute SDH; Acute encephalopathy; Anoxic encephalopathy: - Neurosurgery has been consulted; does not plan intervention - MRI brain ordered - pupils fixed and dilated, concerning for very severe anoxic encephalopathy   FAMILY  - Updates: 2 sisters are at the bedside in ER and were updated; they are aware of poor prognosis but at this time still wish for FULL CODE   60 minutes critical care time   Milana ObeyKathleen Tesean Stump, MD  Pulmonary and Critical Care Medicine [facility] Pager: [phone]  11/19/2016, 10:19 PM

## 2016-11-16 NOTE — ED Notes (Signed)
I unit emergency blood started via rapid infuser ([id number]).

## 2016-11-16 NOTE — ED Notes (Signed)
MD at the bedside intubating pt.

## 2016-11-16 NOTE — ED Notes (Signed)
Admitting/critical care MD bedside w/ family

## 2016-11-16 NOTE — Progress Notes (Signed)
Patient transported on vent from ED to CT and back without complications. 

## 2016-11-16 NOTE — ED Notes (Signed)
First unit emergency blood finished ([id number]), 2nd unit of emergency blood started ([id number]) via rapid infuser.

## 2016-11-16 NOTE — ED Provider Notes (Signed)
[facility] DEPT Provider Note   CSN: [id number] Arrival date & time:        History   Chief Complaint Chief Complaint  Patient presents with  . Cardiac Arrest    HPI [name] is a 71 y.o. female.  The history is provided by the EMS personnel and a relative.  Illness  This is a new problem. The current episode started 12 to 24 hours ago. The problem occurs constantly. The problem has been gradually worsening. Associated symptoms comments: Emesis, altered mental status. Nothing relieves the symptoms. She has tried nothing for the symptoms.    Past Medical History:  Diagnosis Date  . ALLERGIC RHINITIS   . Arthritis   . ASTHMA, UNSPECIFIED, UNSPECIFIED STATUS   . DYSLIPIDEMIA   . Headache(784.0)   . Hyperlipidemia 02/20/2009   Qualifier: Diagnosis of  By: Asa Lente MD, Jannifer Rodney HYPERTENSION   . Hypertension   . Mild renal insufficiency   . PNA (pneumonia)   . Shortness of breath dyspnea   . SPINAL STENOSIS, LUMBAR    MRI 12/2005; surg 2004  . TOBACCO ABUSE     Patient Active Problem List   Diagnosis Date Noted  . Septic shock ([place]) 12/10/2016  . 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 Stroke ([place]) 10/16/2016  . Elevated troponin   . Altered mental status 03/19/2016  . Neuropathic pain   . Anxiety state   . Sleep disturbance   . Post-operative pain   . Spondylolisthesis of lumbar region 01/02/2016  . Surgery, elective   . Chronic obstructive pulmonary disease ([place])   . Respiratory failure with hypercapnia ([place])   . Chronic back pain   . Postoperative anemia due to acute blood loss   . Tachycardia   . Leukocytosis   . Thrombocytopenia ([place])   . Lumbosacral spondylosis with radiculopathy 12/30/2015  . Acute encephalopathy   . AKI (acute kidney injury) ([place])   . CKD (chronic kidney disease), stage II   . HEADACHE 08/16/2009  . Hyperlipidemia 02/20/2009  . TOBACCO ABUSE 02/20/2009  . Essential  hypertension 02/20/2009  . ALLERGIC RHINITIS 02/20/2009  . Asthma 02/20/2009  . SHOULDER PAIN, RIGHT, CHRONIC 02/20/2009  . HIP PAIN, RIGHT, CHRONIC 02/20/2009  . SPINAL STENOSIS, CERVICAL 02/20/2009  . SPINAL STENOSIS, LUMBAR 02/20/2009  . BACK PAIN, LUMBAR 02/20/2009    Past Surgical History:  Procedure Laterality Date  . ANTERIOR LAT LUMBAR FUSION Right 12/30/2015   Procedure: Lumbar two-three, Lumbar three-four Anterior Lateral Lumbar Interbody Fusion with Anterior column realignment at Lumbar three-four;  Surgeon: Kevan Ny Ditty, MD;  Location: [facility];  Service: Neurosurgery;  Laterality: Right;  . Closure of bronchopleural fistula, right lower lobe.    [name] COLONOSCOPY    . Decompressive Laminectomy     Fusion L4-5, L5-S1 ([place]-2004)  . Decortication of right lower lobe    . LOOP RECORDER INSERTION N/A 10/19/2016   Procedure: Loop Recorder Insertion;  Surgeon: Deboraha Sprang, MD;  Location: [facility] LAB;  Service: Cardiovascular;  Laterality: N/A;  . Placement of wound On-Q analgesia irrigation system.    [name] POSTERIOR LUMBAR FUSION 4 LEVEL N/A 12/30/2015   Procedure: Lumbar two -Sacral one Dorsal Internal Fixation and Fusion, Charline Bills osteotomies Lumbar two-three, Lumbar three-four;  Surgeon: Kevan Ny Ditty, MD;  Location: [facility];  Service: Neurosurgery;  Laterality: N/A;  . Right hand  1995  . Right knee  1990  . Right  video-assisted thoracoscopic surgery, drainage of empyema.  02/12/2011  . TEE WITHOUT CARDIOVERSION N/A 10/19/2016   Procedure: TRANSESOPHAGEAL ECHOCARDIOGRAM (TEE);  Surgeon: Dixie Dials, MD;  Location: [facility] ENDOSCOPY;  Service: Cardiovascular;  Laterality: N/A;    OB History    Gravida Para Term Preterm AB Living   '5 4 4   1 4   ' SAB TAB Ectopic Multiple Live Births     1             Home Medications    Prior to Admission medications   Medication Sig Start Date End Date Taking? Authorizing Provider  albuterol (PROVENTIL  HFA;VENTOLIN HFA) 108 (90 Base) MCG/ACT inhaler Inhale 2 puffs into the lungs every 4 (four) hours as needed for wheezing or shortness of breath. 08/30/16  Yes Forde Dandy, MD  aspirin 325 MG tablet Take 1 tablet (325 mg total) by mouth daily. 10/21/16  Yes Eugenie Filler, MD  clopidogrel (PLAVIX) 75 MG tablet Take 1 tablet (75 mg total) by mouth daily. 10/21/16  Yes Eugenie Filler, MD  diltiazem (DILACOR XR) 240 MG 24 hr capsule Take 240 mg by mouth daily.   Yes [provider]  escitalopram (LEXAPRO) 10 MG tablet Take 10 mg by mouth daily.   Yes [provider]  Fluticasone-Salmeterol (ADVAIR DISKUS) 250-50 MCG/DOSE AEPB Inhale 1 puff into the lungs 2 (two) times daily.     Yes [provider]  losartan (COZAAR) 100 MG tablet Take 100 mg by mouth daily.  07/27/16  Yes [provider]  polyethylene glycol (MIRALAX / GLYCOLAX) packet Take 17 g by mouth daily. 10/21/16  Yes Eugenie Filler, MD  QUEtiapine (SEROQUEL) 25 MG tablet Take 1 tablet (25 mg total) by mouth at bedtime. 10/23/16  Yes Johnson, Clanford L, MD  rosuvastatin (CRESTOR) 10 MG tablet Take 10 mg by mouth daily.    Yes [provider]  senna-docusate (SENOKOT-S) 8.6-50 MG tablet Take 1 tablet by mouth 2 (two) times daily. 10/20/16  Yes Eugenie Filler, MD  SINGULAIR 10 MG tablet Take 1 tablet by mouth daily. 07/02/16  Yes [provider]  thiamine (VITAMIN B-1) 100 MG tablet Take 100 mg by mouth daily. 04/30/16  Yes [provider]  tiotropium (SPIRIVA) 18 MCG inhalation capsule Place 18 mcg into inhaler and inhale daily.     Yes [provider]    Family History Family History  Problem Relation Age of Onset  . Mental illness Mother   . Hypertension Mother   . Arthritis Other   . Hypertension Other     Social History Social History  Substance Use Topics  . Smoking status: Current Every Day Smoker    Packs/day: 0.25    Years: 40.00    Types:  Cigarettes  . 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 Alcohol use No     Allergies   Shrimp [shellfish allergy]   Review of Systems Review of Systems  Unable to perform ROS: Intubated     Physical Exam Updated Vital Signs BP 108/87   Pulse 81   Temp 97 F (36.1 C) (Temporal)   Resp (!) 21   Ht '5\' 5"'  (1.651 m) Comment: measured by RT  Wt 61.2 kg (135 lb)   SpO2 96%   BMI 22.47 kg/m   Physical Exam  Constitutional: She appears well-developed and well-nourished.  HENT:  Head: Normocephalic and  atraumatic.  Eyes: Conjunctivae are normal.  Neck: No tracheal deviation present.  Cardiovascular: Normal rate and intact distal pulses.   Pulmonary/Chest: No respiratory distress.  Coarse breath sounds bilaterally, with King airway in place  Abdominal: Soft. She exhibits no distension.  Musculoskeletal: She exhibits no deformity.  Neurological:  GCS 3 upon arrival  Skin: Skin is warm and dry.  Psychiatric:  Unable to assess     ED Treatments / Results  Labs (all labs ordered are listed, but only abnormal results are displayed) Labs Reviewed  CBC WITH DIFFERENTIAL/PLATELET - Abnormal; Notable for the following:       Result Value   RBC 2.70 (*)    Hemoglobin 8.2 (*)    HCT 26.4 (*)    Platelets 145 (*)    Neutro Abs 8.4 (*)    All other components within normal limits  COMPREHENSIVE METABOLIC PANEL - Abnormal; Notable for the following:    Glucose, Bld 217 (*)    BUN 38 (*)    Creatinine, Ser 2.67 (*)    Calcium 8.8 (*)    Total Protein 5.2 (*)    Albumin 2.6 (*)    ALT 13 (*)    GFR calc non Af Amer 17 (*)    GFR calc Af Amer 20 (*)    All other components within normal limits  TROPONIN I - Abnormal; Notable for the following:    Troponin I 0.04 (*)    All other components within normal limits  PROTIME-INR - Abnormal; Notable for the following:    Prothrombin Time 16.5 (*)    All  other components within normal limits  I-STAT CG4 LACTIC ACID, ED - Abnormal; Notable for the following:    Lactic Acid, Venous 3.33 (*)    All other components within normal limits  I-STAT ARTERIAL BLOOD GAS, ED - Abnormal; Notable for the following:    pH, Arterial 7.105 (*)    pCO2 arterial 70.4 (*)    pO2, Arterial 394.0 (*)    Acid-base deficit 7.0 (*)    All other components within normal limits  I-STAT ARTERIAL BLOOD GAS, ED - Abnormal; Notable for the following:    pH, Arterial 7.219 (*)    pO2, Arterial 78.0 (*)    Bicarbonate 19.1 (*)    Acid-base deficit 8.0 (*)    All other components within normal limits  CULTURE, BLOOD (ROUTINE X 2)  CULTURE, BLOOD (ROUTINE X 2)  CULTURE, EXPECTORATED SPUTUM-ASSESSMENT  LIPASE, BLOOD  OCCULT BLOOD GASTRIC / DUODENUM (SPECIMEN CUP)  APTT  URINALYSIS, ROUTINE W REFLEX MICROSCOPIC  PROCALCITONIN  LACTIC ACID, PLASMA  LACTIC ACID, PLASMA  CBC  BASIC METABOLIC PANEL  BLOOD GAS, ARTERIAL  MAGNESIUM  PHOSPHORUS  TRIGLYCERIDES  PROCALCITONIN  CBC  TYPE AND SCREEN  PREPARE FRESH FROZEN PLASMA  PREPARE RBC (CROSSMATCH)    EKG  EKG Interpretation None       Radiology Ct Head Wo Contrast  Result Date: 11/24/2016 CLINICAL DATA:  Found down. Agonal breathing. History of RIGHT posterior cerebral artery territory infarct, hypertension and dyslipidemia. EXAM: CT HEAD WITHOUT CONTRAST TECHNIQUE: Contiguous axial images were obtained from the base of the skull through the vertex without intravenous contrast. COMPARISON:  MRI of the head October 22, 2016 and CT HEAD October 21, 2016 FINDINGS: BRAIN: Dense 10 mm RIGHT holo hemispheric subdural hematoma. Dense 5 mm falcotentorial subdural hematoma. 6 mm RIGHT to LEFT midline shift. RIGHT lateral ventricle effacement without entrapment. Global edema with basal cistern effacement.  Subacute to chronic RIGHT temporal occipital lobe infarct with probable petechial hemorrhage. Subacute to old RIGHT thalamus  and RIGHT splenium of the corpus callosum infarcts better seen on prior MRI. No intraparenchymal lobar hematoma. Patchy white matter changes compatible chronic small vessel ischemic disease better demonstrated on prior MRI. VASCULAR: Mild calcific atherosclerosis of the carotid siphons. SKULL: No skull fracture. Small to moderate LEFT frontoparietal subdural hematoma. SINUSES/ORBITS: Mild paranasal sinus mucosal thickening without air-fluid levels. Mastoid air cells are well aerated.The included ocular globes and orbital contents are non-suspicious. OTHER: None. IMPRESSION: 1. Acute to subacute RIGHT 10 mm holo hemispheric subdural hematoma and, 5 mm similar aged falcotentorial subdural hematoma. 6 mm RIGHT to LEFT midline shift without entrapment. LEFT frontoparietal scalp hematoma, suggesting traumatic etiology . 2. Global edema, effaced basal cisterns. 3. Subacute to chronic RIGHT PCA territory (RIGHT thalamus and RIGHT temporal occipital lobe) infarcts with petechial hemorrhage . Critical Value/emergent results were called by telephone at the time of interpretation on 12/11/2016 at 9:25 pm to Dr. Jola Schmidt , who verbally acknowledged these results. Electronically Signed   By: Elon Alas M.D.   On: 12/08/2016 21:36   Dg Chest Portable 1 View  Result Date: 12/02/2016 CLINICAL DATA:  Central line placement EXAM: PORTABLE CHEST 1 VIEW COMPARISON:  November 16, 2016, 6:59 p.m. FINDINGS: Left central venous line is identified with distal tip in the superior vena cava. Endotracheal tube is identified with distal tip 1.7 cm from carina. Nasogastric tube is identified with distal tip not included on the film but is at least in the stomach. Coarse right perihilar opacities are identified. There is increased pulmonary interstitium bilaterally. There is no pneumothorax. The heart size is normal. IMPRESSION: Left central venous line distal tip in the superior vena cava. There is no pneumothorax. Pulmonary edema.  Endotracheal tube and nasogastric tube as described. Electronically Signed   By: Abelardo Diesel M.D.   On: 12/09/2016 20:06   Dg Chest Portable 1 View  Result Date: 11/24/2016 CLINICAL DATA:  Post intubation.  Upper GI bleed. EXAM: PORTABLE CHEST 1 VIEW COMPARISON:  Radiograph 08/30/2016 FINDINGS: Endotracheal tube 2.1 cm from the carina. Enteric tube in place, tip below the diaphragm in the stomach, side-port not well visualized. Heart is normal in size, there is tortuosity and atherosclerosis of the thoracic aorta. Lower lung volumes from prior exam. Irregular coarse right perihilar opacities. No confluent airspace disease. Chronic blunting of right costophrenic angle. No pneumothorax. Remote right posterolateral rib fracture. Loop recorder projects over the left chest wall. IMPRESSION: 1. Endotracheal tube 2.1 cm from the carina. Enteric tube in place, tip below the diaphragm in the stomach, side-port not well visualized. 2. Irregular coarse right perihilar opacities may be asymmetric pulmonary edema, pneumonia, atelectasis or aspiration. Electronically Signed   By: Jeb Levering M.D.   On: 12/07/2016 19:23    Procedures Procedure Name: Intubation Date/Time: 12/01/2016 11:25 PM Performed by: Valda Lamb Pre-anesthesia Checklist: Patient identified, Emergency Drugs available, Suction available and Patient being monitored Oxygen Delivery Method: Ambu bag Ventilation: Mask ventilation without difficulty Laryngoscope Size: Glidescope and 4 Grade View: Grade I Tube size: 7.5 mm Number of attempts: 1 Airway Equipment and Method: Rigid stylet Placement Confirmation: ETT inserted through vocal cords under direct vision,  Breath sounds checked- equal and bilateral and Positive ETCO2 Secured at: 24 cm Tube secured with: ETT holder    .Central Line Date/Time: 11/28/2016 11:27 PM Performed by: Valda Lamb Authorized by: Jola Schmidt   Consent:  Consent obtained:  Emergent  situation Pre-procedure details:    Hand hygiene: Hand hygiene performed prior to insertion     Sterile barrier technique: All elements of maximal sterile technique followed     Skin preparation:  2% chlorhexidine   Skin preparation agent: Skin preparation agent completely dried prior to procedure   Anesthesia (see MAR for exact dosages):    Anesthesia method:  None Procedure details:    Location:  L subclavian   Patient position:  Trendelenburg   Procedural supplies:  Triple lumen   Catheter size:  7.5 Fr   Landmarks identified: yes     Number of attempts:  1   Successful placement: yes   Post-procedure details:    Post-procedure:  Dressing applied and line sutured   Assessment:  Blood return through all ports, no pneumothorax on x-ray, placement verified by x-ray and free fluid flow   Patient tolerance of procedure:  Tolerated well, no immediate complications   (including critical care time)  Medications Ordered in ED Medications  pantoprazole (PROTONIX) 80 mg in sodium chloride 0.9 % 250 mL (0.32 mg/mL) infusion (8 mg/hr Intravenous Transfusing/Transfer 11/22/2016 2308)  pantoprazole (PROTONIX) injection 40 mg (not administered)  vancomycin (VANCOCIN) IVPB 1000 mg/200 mL premix (not administered)  0.9 %  sodium chloride infusion (not administered)  acetaminophen (TYLENOL) tablet 650 mg (not administered)  ondansetron (ZOFRAN) injection 4 mg (not administered)  ipratropium-albuterol (DUONEB) 0.5-2.5 (3) MG/3ML nebulizer solution 3 mL (not administered)  chlorhexidine gluconate (MEDLINE KIT) (PERIDEX) 0.12 % solution 15 mL (not administered)  MEDLINE mouth rinse (not administered)  norepinephrine (LEVOPHED) 4 mg in dextrose 5 % 250 mL (0.016 mg/mL) infusion (not administered)  insulin aspart (novoLOG) injection 2-6 Units (not administered)  fentaNYL (SUBLIMAZE) injection 50 mcg (not administered)  fentaNYL (SUBLIMAZE) injection 50 mcg (not administered)  propofol (DIPRIVAN) 1000  MG/100ML infusion (not administered)  midazolam (VERSED) injection 1 mg (not administered)  midazolam (VERSED) injection 1 mg (not administered)  docusate (COLACE) 50 MG/5ML liquid 100 mg (not administered)  ceFEPIme (MAXIPIME) 1 g in dextrose 5 % 50 mL IVPB (not administered)  sodium bicarbonate 150 mEq in sterile water 1,000 mL infusion (not administered)  budesonide (PULMICORT) nebulizer solution 0.5 mg (not administered)  arformoterol (BROVANA) nebulizer solution 15 mcg (not administered)  pantoprazole (PROTONIX) 80 mg in sodium chloride 0.9 % 100 mL IVPB (0 mg Intravenous Stopped 11/15/2016 2046)  0.9 %  sodium chloride infusion (10 mL/hr Intravenous Transfusing/Transfer 12/09/2016 2307)  norepinephrine (LEVOPHED) 4 mg in dextrose 5 % 250 mL (0.016 mg/mL) infusion (0 mcg/min Intravenous Transfusing/Transfer 12/06/2016 2307)  piperacillin-tazobactam (ZOSYN) IVPB 3.375 g (0 g Intravenous Stopped 11/15/2016 2122)  vancomycin (VANCOCIN) IVPB 1000 mg/200 mL premix (0 mg Intravenous Stopped 12/06/2016 2200)     Initial Impression / Assessment and Plan / ED Course  I have reviewed the triage vital signs and the nursing notes.  Pertinent labs & imaging results that were available during my care of the patient were reviewed by me and considered in my medical decision making (see chart for details).       KARLENE SOUTHARD is a 71 year old female with history of recent CVA, who takes BC powder daily and is currently on aspirin and Plavix. Patient from nursing home with concern for cardiac arrest. Per EMS patient bradycardic and went into cardiac arrest in route, received around 10 minutes of CPR and was given 1 of epi in route as well as fluids. Rosc achieved. Concern for upper GI  bleed this patient coffee-ground emesis for EMS as well as here in the emergency department. Intubated here in the emergency department, central line placed. GCS 3T, and pupils nonreactive however gag reflex is present. Started on IV  Protonix as well as given 4 units of blood. On levophed with pressures now in the 130s over 80s. Hemoglobin 8.2 pre transfusion which was a drop from 14 a few weeks ago while in the hospital.  Patient will be admitted to ICU. GI was also counseled the and will likely do EGD in the morning. Patient in critical condition.  Patient was seen with my attending, Dr. Venora Maples, who voiced agreement and oversaw the evaluation and treatment of this patient.   Dragon Field seismologist was used in the creation of this note. If there are any errors or inconsistencies needing clarification, please contact me directly.   Final Clinical Impressions(s) / ED Diagnoses   Final diagnoses:  Cardiac arrest ([place])  Upper GI bleed  Hemorrhagic shock [facility])    New Prescriptions New Prescriptions   No medications on file     Valda Lamb, MD 12/15/2016 [place]    Jola Schmidt, MD 12/04/2016 [phone]

## 2016-11-16 NOTE — ED Notes (Signed)
3rd unit of emergency blood started ([id number]) via rapid infuser.

## 2016-11-16 NOTE — ED Notes (Signed)
1000 NS finished, total of 3000.

## 2016-11-16 NOTE — ED Notes (Signed)
Neuro provider bedside w/ family

## 2016-11-16 NOTE — Progress Notes (Signed)
Chaplain was paged by nurse to assist family of Pt of pst cpr. Chaplain escorted 2 daughters and a son back to [place] and got refreshments. Chaplain checked on pt from care team and reported back to family what he saw and what Dr told him to say. Chaplain stayed with family and talked about the love they show fopr each other . They talked about their family and each other . The Dr told them the pt was in critical condition. Family was visually upset but they leaned on each other. A relative who is a Education officer, environmentalpastor came and was in a pastoral role . Chaplain asked if they wasn't to be alone and said they did. There expressed their gratitude     2016-11-03 2000  Clinical Encounter Type  Visited With Family  Visit Type Initial;Spiritual support;Critical Care;ED  Referral From Nurse  Spiritual Encounters  Spiritual Needs Prayer;Emotional  Stress Factors  Patient Stress Factors Major life changes  Family Stress Factors Major life changes

## 2016-11-16 NOTE — ED Notes (Signed)
3rd unit of emergency blood completed ([id number]) and 4th unit started ([id number]) via rapid infuser.

## 2016-11-16 NOTE — ED Notes (Signed)
Called, and added,PT I&R to blood in main lab

## 2016-11-16 NOTE — ED Notes (Signed)
Family bedside w/ Chaplin.  RN introduced self to family and answered questions.

## 2016-11-16 NOTE — ED Notes (Signed)
PT, RN and respiratory back from CT.

## 2016-11-16 NOTE — ED Triage Notes (Signed)
Per [facility], [facility] complaints of altered Mental Status and SOB. Pt was noted to have coffee ground emesis when EMS Arrived. Pt quickly went into Cardiac ARrest. 6-10 minutes of CPR was completed with 1 of EPI. Pt was able to regain pulses. HR ST. Pt unresponsive. IO in Right TibFib. Vitals per EMS: 196/118, 124 HR, 10 RR, 60 CO2, CBG 266

## 2016-11-17 ENCOUNTER — Inpatient Hospital Stay (HOSPITAL_COMMUNITY): Payer: Medicare Other

## 2016-11-17 ENCOUNTER — Encounter (HOSPITAL_COMMUNITY): Admission: EM | Disposition: E | Payer: Self-pay | Source: Home / Self Care | Attending: Internal Medicine

## 2016-11-17 ENCOUNTER — Encounter (HOSPITAL_COMMUNITY): Payer: Self-pay | Admitting: *Deleted

## 2016-11-17 DIAGNOSIS — R578 Other shock: Secondary | ICD-10-CM

## 2016-11-17 DIAGNOSIS — K922 Gastrointestinal hemorrhage, unspecified: Secondary | ICD-10-CM | POA: Diagnosis present

## 2016-11-17 DIAGNOSIS — I469 Cardiac arrest, cause unspecified: Secondary | ICD-10-CM

## 2016-11-17 DIAGNOSIS — K254 Chronic or unspecified gastric ulcer with hemorrhage: Secondary | ICD-10-CM

## 2016-11-17 DIAGNOSIS — J9600 Acute respiratory failure, unspecified whether with hypoxia or hypercapnia: Secondary | ICD-10-CM

## 2016-11-17 DIAGNOSIS — I360 Nonrheumatic tricuspid (valve) stenosis: Secondary | ICD-10-CM

## 2016-11-17 HISTORY — PX: ESOPHAGOGASTRODUODENOSCOPY: SHX5428

## 2016-11-17 LAB — BPAM RBC
BLOOD PRODUCT EXPIRATION DATE: 201807082359
BLOOD PRODUCT EXPIRATION DATE: 201807162359
BLOOD PRODUCT EXPIRATION DATE: 201807242359
BLOOD PRODUCT EXPIRATION DATE: 201807252359
ISSUE DATE / TIME: 201807021859
ISSUE DATE / TIME: 201807021953
ISSUE DATE / TIME: 201807021953
ISSUE DATE / TIME: 201807021859
UNIT TYPE AND RH: 5100
UNIT TYPE AND RH: 9500
Unit Type and Rh: 5100
Unit Type and Rh: 9500

## 2016-11-17 LAB — TYPE AND SCREEN
ABO/RH(D): O POS
Antibody Screen: NEGATIVE
UNIT DIVISION: 0
UNIT DIVISION: 0
Unit division: 0
Unit division: 0

## 2016-11-17 LAB — GLUCOSE, CAPILLARY
GLUCOSE-CAPILLARY: 147 mg/dL — AB (ref 65–99)
GLUCOSE-CAPILLARY: 166 mg/dL — AB (ref 65–99)
GLUCOSE-CAPILLARY: 127 mg/dL — AB (ref 65–99)
Glucose-Capillary: 126 mg/dL — ABNORMAL HIGH (ref 65–99)
Glucose-Capillary: 154 mg/dL — ABNORMAL HIGH (ref 65–99)
Glucose-Capillary: 150 mg/dL — ABNORMAL HIGH (ref 65–99)
Glucose-Capillary: 125 mg/dL — ABNORMAL HIGH (ref 65–99)

## 2016-11-17 LAB — URINALYSIS, ROUTINE W REFLEX MICROSCOPIC
Bilirubin Urine: NEGATIVE
GLUCOSE, UA: NEGATIVE mg/dL
Ketones, ur: NEGATIVE mg/dL
LEUKOCYTES UA: NEGATIVE
NITRITE: NEGATIVE
PH: 5 (ref 5.0–8.0)
PROTEIN: NEGATIVE mg/dL
Specific Gravity, Urine: 1.003 — ABNORMAL LOW (ref 1.005–1.030)

## 2016-11-17 LAB — BASIC METABOLIC PANEL
Anion gap: 6 (ref 5–15)
BUN: 34 mg/dL — AB (ref 6–20)
CHLORIDE: 119 mmol/L — AB (ref 101–111)
CO2: 21 mmol/L — ABNORMAL LOW (ref 22–32)
Calcium: 8.1 mg/dL — ABNORMAL LOW (ref 8.9–10.3)
Creatinine, Ser: 2.03 mg/dL — ABNORMAL HIGH (ref 0.44–1.00)
GFR calc Af Amer: 28 mL/min — ABNORMAL LOW (ref >60)
GFR calc non Af Amer: 24 mL/min — ABNORMAL LOW (ref >60)
Glucose, Bld: 173 mg/dL — ABNORMAL HIGH (ref 65–99)
POTASSIUM: 3.4 mmol/L — AB (ref 3.5–5.1)
SODIUM: 146 mmol/L — AB (ref 135–145)

## 2016-11-17 LAB — BLOOD GAS, ARTERIAL
ACID-BASE DEFICIT: 5.3 mmol/L — AB (ref 0.0–2.0)
BICARBONATE: 19.6 mmol/L — AB (ref 20.0–28.0)
DRAWN BY: 36277
FIO2: 50
MECHVT: 460 mL
O2 Saturation: 95.8 %
PATIENT TEMPERATURE: 91.1
PCO2 ART: 31.5 mmHg — AB (ref 32.0–48.0)
PEEP/CPAP: 5 cmH2O
PH ART: 7.387 (ref 7.350–7.450)
PO2 ART: 63.6 mmHg — AB (ref 83.0–108.0)
RATE: 26 resp/min

## 2016-11-17 LAB — PHOSPHORUS: Phosphorus: 1.8 mg/dL — ABNORMAL LOW (ref 2.5–4.6)

## 2016-11-17 LAB — CBC
HEMATOCRIT: 39.2 % (ref 36.0–46.0)
Hemoglobin: 12.9 g/dL (ref 12.0–15.0)
MCH: 30.6 pg (ref 26.0–34.0)
MCHC: 33.4 g/dL (ref 30.0–36.0)
MCV: 91.6 fL (ref 78.0–100.0)
PLATELETS: 98 10*3/uL — AB (ref 150–400)
RBC: 4.28 MIL/uL (ref 3.87–5.11)
RDW: 16.1 % — AB (ref 11.5–15.5)
WBC: 5.3 10*3/uL (ref 4.0–10.5)

## 2016-11-17 LAB — PROCALCITONIN: Procalcitonin: 1.22 ng/mL

## 2016-11-17 LAB — MAGNESIUM: MAGNESIUM: 2 mg/dL (ref 1.7–2.4)

## 2016-11-17 LAB — LACTIC ACID, PLASMA: Lactic Acid, Venous: 1.4 mmol/L (ref 0.5–1.9)

## 2016-11-17 LAB — MRSA PCR SCREENING: MRSA BY PCR: NEGATIVE

## 2016-11-17 LAB — BLOOD PRODUCT ORDER (VERBAL) VERIFICATION

## 2016-11-17 LAB — APTT: aPTT: 33 seconds (ref 24–36)

## 2016-11-17 SURGERY — EGD (ESOPHAGOGASTRODUODENOSCOPY)
Anesthesia: Moderate Sedation

## 2016-11-17 MED ORDER — SODIUM CHLORIDE 0.9 % IV BOLUS (SEPSIS)
500.0000 mL | Freq: Once | INTRAVENOUS | Status: AC
  Administered 2016-11-17: 500 mL via INTRAVENOUS

## 2016-11-17 MED ORDER — POTASSIUM PHOSPHATES 15 MMOLE/5ML IV SOLN
30.0000 mmol | Freq: Once | INTRAVENOUS | Status: AC
  Administered 2016-11-17: 30 mmol via INTRAVENOUS
  Filled 2016-11-17: qty 10

## 2016-11-17 MED ORDER — MIDAZOLAM HCL 5 MG/ML IJ SOLN
INTRAMUSCULAR | Status: AC
  Filled 2016-11-17: qty 2

## 2016-11-17 MED ORDER — SODIUM CHLORIDE 0.9 % IV SOLN
INTRAVENOUS | Status: DC
  Administered 2016-11-17 (×2): via INTRAVENOUS

## 2016-11-17 MED ORDER — SODIUM CHLORIDE 0.9 % IV SOLN: INTRAVENOUS | Status: DC

## 2016-11-17 MED ORDER — FENTANYL CITRATE (PF) 100 MCG/2ML IJ SOLN
INTRAMUSCULAR | Status: AC
  Filled 2016-11-17: qty 2

## 2016-11-17 NOTE — Progress Notes (Signed)
eLink Physician-Brief Progress Note Patient Name: [name] DOB: 02-08-1946 MRN: [id number]   Date of Service  12/14/2016  HPI/Events of Note    eICU Interventions  K, Phos repleted     Intervention Category Intermediate Interventions: Electrolyte abnormality - evaluation and management  Max FickleDouglas McQuaid 11/19/2016, 6:45 AM

## 2016-11-17 NOTE — Progress Notes (Signed)
Orthopedic Tech Progress Note Patient Details:  [name] 06/07/1945 [id number] RN called and stated that patient needs a hard collar. Patient ID: [name], female   DOB: 06/07/1945, 71 y.o.   MRN: [id number]   Jennye MoccasinHughes, [name] 12/11/2016, 3:24 PM

## 2016-11-17 NOTE — Progress Notes (Signed)
  Echocardiogram 2D Echocardiogram has been performed.  Janalyn HarderWest, Mikeria Valin R 12/15/2016, 1:55 PM

## 2016-11-17 NOTE — Progress Notes (Signed)
Spoke with family regarding goals of care. Plan to withdrawal at 0800 with family at bedside. Until then family okay with DNR. Code status updated in computer.

## 2016-11-17 NOTE — Progress Notes (Signed)
eLink Physician-Brief Progress Note Patient Name: [name] DOB: December 08, 1945 MRN: [id number]   Date of Service  11/19/2016  HPI/Events of Note    eICU Interventions  Repeat lactic acid     Intervention Category Major Interventions: Sepsis - evaluation and management  Max FickleDouglas McQuaid 12/02/2016, 12:47 AM

## 2016-11-17 NOTE — Consult Note (Signed)
Referring Provider: Dr. Jimmey Ralph Primary Care Physician:  Charolette Forward, MD Primary Gastroenterologist:  Althia Forts  Reason for Consultation:  GI bleed  HPI: [name] is a 71 y.o. female found unresponsive at her rehab center and had coffee grounds emesis around her. Family does not know the extent of the coffee grounds emesis stating that the rehab staff did not tell them about any bleeding prior to this episode. Sister reports she took [facility] powders daily for years prior to admit to rehab center and has been at rehab center for a month. Family reports she has been falling a lot prior to admission to the rehab center and they have seen bruises on her. Daughter and sister are not aware of her having any peptic ulcer disease in the past. She has not had an EGD or colonoscopy in the past. Patient is sedated and intubated. No melena since admit. She is having small amounts of black fluid being aspirated from her OG tube. Cardiac arrest occurred en route to ER with PEA and on arrival she was in shock with a BP of 41/30 and reports of a moderate amount of coffee grounds emesis in the OG tube. Hgb 8.2 in ER and transfused 4 U PRBCs and 12.9 at midnight. Patient on Levophed.  Past Medical History:  Diagnosis Date  . ALLERGIC RHINITIS   . Arthritis   . ASTHMA, UNSPECIFIED, UNSPECIFIED STATUS   . DYSLIPIDEMIA   . Headache(784.0)   . Hyperlipidemia 02/20/2009   Qualifier: Diagnosis of  By: Asa Lente MD, Jannifer Rodney HYPERTENSION   . Hypertension   . Mild renal insufficiency   . PNA (pneumonia)   . Shortness of breath dyspnea   . SPINAL STENOSIS, LUMBAR    MRI 12/2005; surg 2004  . TOBACCO ABUSE     Past Surgical History:  Procedure Laterality Date  . ANTERIOR LAT LUMBAR FUSION Right 12/30/2015   Procedure: Lumbar two-three, Lumbar three-four Anterior Lateral Lumbar Interbody Fusion with Anterior column realignment at Lumbar three-four;  Surgeon: Kevan Ny Ditty, MD;  Location: [place] [facility];  Service: Neurosurgery;  Laterality: Right;  . Closure of bronchopleural fistula, right lower lobe.    [name] COLONOSCOPY    . Decompressive Laminectomy     Fusion L4-5, L5-S1 ([facility]-2004)  . Decortication of right lower lobe    . LOOP RECORDER INSERTION N/A 10/19/2016   Procedure: Loop Recorder Insertion;  Surgeon: Deboraha Sprang, MD;  Location: [facility];  Service: Cardiovascular;  Laterality: N/A;  . Placement of wound On-Q analgesia irrigation system.    [name] POSTERIOR LUMBAR FUSION 4 LEVEL N/A 12/30/2015   Procedure: Lumbar two -Sacral one Dorsal Internal Fixation and Fusion, Charline Bills osteotomies Lumbar two-three, Lumbar three-four;  Surgeon: Kevan Ny Ditty, MD;  Location: [place] [facility];  Service: Neurosurgery;  Laterality: N/A;  . Right hand  1995  . Right knee  1990  . Right video-assisted thoracoscopic surgery, drainage of empyema.  02/12/2011  . TEE WITHOUT CARDIOVERSION N/A 10/19/2016   Procedure: TRANSESOPHAGEAL ECHOCARDIOGRAM (TEE);  Surgeon: Dixie Dials, MD;  Location: [facility] ENDOSCOPY;  Service: Cardiovascular;  Laterality: N/A;    Prior to Admission medications   Medication Sig Start Date End Date Taking? Authorizing Provider  albuterol (PROVENTIL HFA;VENTOLIN HFA) 108 (90 Base) MCG/ACT inhaler Inhale 2 puffs into the lungs every 4 (four) hours as needed for wheezing or shortness of breath. 08/30/16  Yes Forde Dandy, MD  aspirin 325 MG tablet Take 1 tablet (  325 mg total) by mouth daily. 10/21/16  Yes Eugenie Filler, MD  clopidogrel (PLAVIX) 75 MG tablet Take 1 tablet (75 mg total) by mouth daily. 10/21/16  Yes Eugenie Filler, MD  diltiazem (DILACOR XR) 240 MG 24 hr capsule Take 240 mg by mouth daily.   Yes [provider]  escitalopram (LEXAPRO) 10 MG tablet Take 10 mg by mouth daily.   Yes [provider]  Fluticasone-Salmeterol (ADVAIR DISKUS) 250-50 MCG/DOSE AEPB Inhale 1 puff into the lungs 2 (two) times daily.     Yes [provider]  losartan (COZAAR) 100 MG tablet Take 100 mg by mouth daily.  07/27/16  Yes [provider]  polyethylene glycol (MIRALAX / GLYCOLAX) packet Take 17 g by mouth daily. 10/21/16  Yes Eugenie Filler, MD  QUEtiapine (SEROQUEL) 25 MG tablet Take 1 tablet (25 mg total) by mouth at bedtime. 10/23/16  Yes Johnson, Clanford L, MD  rosuvastatin (CRESTOR) 10 MG tablet Take 10 mg by mouth daily.    Yes [provider]  senna-docusate (SENOKOT-S) 8.6-50 MG tablet Take 1 tablet by mouth 2 (two) times daily. 10/20/16  Yes Eugenie Filler, MD  SINGULAIR 10 MG tablet Take 1 tablet by mouth daily. 07/02/16  Yes [provider]  thiamine (VITAMIN B-1) 100 MG tablet Take 100 mg by mouth daily. 04/30/16  Yes [provider]  tiotropium (SPIRIVA) 18 MCG inhalation capsule Place 18 mcg into inhaler and inhale daily.     Yes [provider]    Scheduled Meds: . arformoterol  15 mcg Nebulization BID  . budesonide (PULMICORT) nebulizer solution  0.5 mg Nebulization BID  . chlorhexidine gluconate (MEDLINE KIT)  15 mL Mouth Rinse BID  . insulin aspart  2-6 Units Subcutaneous Q4H  . ipratropium-albuterol  3 mL Nebulization Q6H  . mouth rinse  15 mL Mouth Rinse QID  . [START ON 11/20/2016] pantoprazole  40 mg Intravenous Q12H   Continuous Infusions: . sodium chloride    . ceFEPime (MAXIPIME) IV Stopped (11/29/2016 0330)  . norepinephrine (LEVOPHED) Adult infusion 7 mcg/min (11/20/2016 0800)  . pantoprozole (PROTONIX) infusion 8 mg/hr (11/20/2016 0740)  . potassium phosphate IVPB (mmol) 30 mmol (12/12/2016 0746)  . propofol (DIPRIVAN) infusion Stopped (11/24/2016 2215)  .  sodium bicarbonate (isotonic) infusion in sterile water 75 mL/hr at 12/08/2016 0800  . [START ON 12-11-2016] vancomycin     PRN Meds:.sodium chloride, acetaminophen, docusate, fentaNYL (SUBLIMAZE) injection, fentaNYL (SUBLIMAZE) injection, midazolam, midazolam, ondansetron (ZOFRAN) IV  Allergies as of 12/10/2016 -  Review Complete 11/22/2016  Allergen Reaction Noted  . Shrimp [shellfish allergy] Hives and Swelling 12/21/2013    Family History  Problem Relation Age of Onset  . Mental illness Mother   . Hypertension Mother   . Arthritis Other   . Hypertension Other     Social History   Social History  . Marital status: Single    Spouse name: N/A  . Number of children: N/A  . Years of education: N/A   Occupational History  . Not on file.   Social History Main Topics  . Smoking status: Current Every Day Smoker    Packs/day: 0.25    Years: 40.00    Types: Cigarettes  . 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 Alcohol use No  . Drug use: No  . Sexual activity: No     Comment: no sex x  20 yrs   Other Topics Concern  . Not on file   Social History Narrative  . No narrative on file    Review of Systems: Unable to obtain  Physical Exam: Vital signs: Vitals:   11/15/2016 0739 12/03/2016 0800  BP:  91/65  Pulse:  (!) 116  Resp:  (!) 26  Temp: 98.8 F (37.1 C)      General:   Sedated intubated, well-nourished HEENT: dry mouth Lungs:  Coarse breath sounds  Heart:  Regular rate and rhythm; no murmurs, clicks, rubs,  or gallops. Abdomen: soft, mild distention, nontender (no facial grimace), +BS  Rectal:  Deferred Ext: +LE edema  GI:  Lab Results:  Recent Labs  11/15/2016 1933 11/27/2016 0052  WBC 10.0 5.3  HGB 8.2* 12.9  HCT 26.4* 39.2  PLT 145* 98*   BMET  Recent Labs  12/08/2016 1933 11/26/2016 0411  NA 143 146*  K 4.0 3.4*  CL 110 119*  CO2 22 21*  GLUCOSE 217* 173*  BUN 38* 34*  CREATININE 2.67* 2.03*  CALCIUM 8.8* 8.1*   LFT  Recent Labs  11/22/2016 1933  PROT 5.2*  ALBUMIN 2.6*  AST 34  ALT 13*  ALKPHOS 43  BILITOT 0.9   PT/INR  Recent Labs  12/14/2016 1959  LABPROT 16.5*  INR 1.32     Studies/Results: Ct Head Wo Contrast  Result Date: 11/30/2016 CLINICAL DATA:  Found  down. Agonal breathing. History of RIGHT posterior cerebral artery territory infarct, hypertension and dyslipidemia. EXAM: CT HEAD WITHOUT CONTRAST TECHNIQUE: Contiguous axial images were obtained from the base of the skull through the vertex without intravenous contrast. COMPARISON:  MRI of the head October 22, 2016 and CT HEAD October 21, 2016 FINDINGS: BRAIN: Dense 10 mm RIGHT holo hemispheric subdural hematoma. Dense 5 mm falcotentorial subdural hematoma. 6 mm RIGHT to LEFT midline shift. RIGHT lateral ventricle effacement without entrapment. Global edema with basal cistern effacement. Subacute to chronic RIGHT temporal occipital lobe infarct with probable petechial hemorrhage. Subacute to old RIGHT thalamus and RIGHT splenium of the corpus callosum infarcts better seen on prior MRI. No intraparenchymal lobar hematoma. Patchy white matter changes compatible chronic small vessel ischemic disease better demonstrated on prior MRI. VASCULAR: Mild calcific atherosclerosis of the carotid siphons. SKULL: No skull fracture. Small to moderate LEFT frontoparietal subdural hematoma. SINUSES/ORBITS: Mild paranasal sinus mucosal thickening without air-fluid levels. Mastoid air cells are well aerated.The included ocular globes and orbital contents are non-suspicious. OTHER: None. IMPRESSION: 1. Acute to subacute RIGHT 10 mm holo hemispheric subdural hematoma and, 5 mm similar aged falcotentorial subdural hematoma. 6 mm RIGHT to LEFT midline shift without entrapment. LEFT frontoparietal scalp hematoma, suggesting traumatic etiology . 2. Global edema, effaced basal cisterns. 3. Subacute to chronic RIGHT PCA territory (RIGHT thalamus and RIGHT temporal occipital lobe) infarcts with petechial hemorrhage . Critical Value/emergent results were called by telephone at the time of interpretation on 12/14/2016 at 9:25 pm to Dr. Jola Schmidt , who verbally acknowledged these results. Electronically Signed   By: Elon Alas M.D.   On:  11/25/2016 21:36   Dg Chest Port 1 View  Result Date: 11/25/2016 CLINICAL DATA:  Respiratory failure. EXAM: PORTABLE CHEST 1 VIEW COMPARISON:  12/07/2016 . FINDINGS: Endotracheal tube tip 1.3 cm above the carina. Proximal repositioning of approximately 2 cm should be considered. NG tube noted with tip below left hemidiaphragm. Left subclavian line stable position. Heart size stable. Diffuse right lung infiltrate, progressed from prior exam. Left base infiltrate. Small  right pleural effusion, new from prior exam. No pneumothorax. IMPRESSION: 1. Endotracheal tube tip 1.3 cm above the carina. Proximal repositioning of approximately 2 cm should be considered. NG tube noted with tip below left hemidiaphragm. Left subclavian line stable position. 2. Progressive diffuse right lung infiltrate. New small right pleural effusion. 3. Left base infiltrate again noted. Electronically Signed   By: Marcello Moores  Register   On: 12/02/2016 06:48   Dg Chest Portable 1 View  Result Date: 11/26/2016 CLINICAL DATA:  Central line placement EXAM: PORTABLE CHEST 1 VIEW COMPARISON:  November 16, 2016, 6:59 p.m. FINDINGS: Left central venous line is identified with distal tip in the superior vena cava. Endotracheal tube is identified with distal tip 1.7 cm from carina. Nasogastric tube is identified with distal tip not included on the film but is at least in the stomach. Coarse right perihilar opacities are identified. There is increased pulmonary interstitium bilaterally. There is no pneumothorax. The heart size is normal. IMPRESSION: Left central venous line distal tip in the superior vena cava. There is no pneumothorax. Pulmonary edema. Endotracheal tube and nasogastric tube as described. Electronically Signed   By: Abelardo Diesel M.D.   On: 11/25/2016 20:06   Dg Chest Portable 1 View  Result Date: 11/29/2016 CLINICAL DATA:  Post intubation.  Upper GI bleed. EXAM: PORTABLE CHEST 1 VIEW COMPARISON:  Radiograph 08/30/2016 FINDINGS: Endotracheal  tube 2.1 cm from the carina. Enteric tube in place, tip below the diaphragm in the stomach, side-port not well visualized. Heart is normal in size, there is tortuosity and atherosclerosis of the thoracic aorta. Lower lung volumes from prior exam. Irregular coarse right perihilar opacities. No confluent airspace disease. Chronic blunting of right costophrenic angle. No pneumothorax. Remote right posterolateral rib fracture. Loop recorder projects over the left chest wall. IMPRESSION: 1. Endotracheal tube 2.1 cm from the carina. Enteric tube in place, tip below the diaphragm in the stomach, side-port not well visualized. 2. Irregular coarse right perihilar opacities may be asymmetric pulmonary edema, pneumonia, atelectasis or aspiration. Electronically Signed   By: Jeb Levering M.D.   On: 11/20/2016 19:23    Impression/Plan: Hemorrhagic shock with cardiac arrest en route to ER - question peptic ulcer bleed vs. Esophagitis. Continue Protonix drip. Bedside EGD this morning. Supportive care. Risks/benefits of EGD discussed with family and they agree to proceed.    LOS: 1 day   [place] C.  12/12/2016, 8:17 AM  Pager [phone]  AFTER 5 pm or on weekends please call [phone]

## 2016-11-17 NOTE — Op Note (Signed)
[facility] Patient Name: [name] Procedure Date : 12/11/2016 MRN: [id number] Attending MD: Shirley Friar , MD Date of Birth: Apr 11, 1946 CSN: [id number] Age: 71 Admit Type: Inpatient Procedure:                Upper GI endoscopy Indications:              Suspected upper gastrointestinal bleeding,                            Coffee-ground emesis Providers:                Shirley Friar, MD, Dwain Sarna, RN, Margo Aye, Technician Referring MD:              Medicines:                Propofol per CCM; Patient intubated Complications:            No immediate complications. Estimated Blood Loss:     Estimated blood loss was minimal. Procedure:                Pre-Anesthesia Assessment:                           - Prior to the procedure, a History and Physical                            was performed, and patient medications and                            allergies were reviewed. The patient's tolerance of                            previous anesthesia was also reviewed. The risks                            and benefits of the procedure and the sedation                            options and risks were discussed with the patient.                            All questions were answered, and informed consent                            was obtained. Prior Anticoagulants: The patient has                            taken Plavix (clopidogrel), last dose was 1 day                            prior to procedure. ASA Grade Assessment: IV - A  patient with severe systemic disease that is a                            constant threat to life. After reviewing the risks                            and benefits, the patient was deemed in                            satisfactory condition to undergo the procedure.                           After obtaining informed consent, the endoscope was                            passed  under direct vision. Throughout the                            procedure, the patient's blood pressure, pulse, and                            oxygen saturations were monitored continuously. The                            EG-[id number] ([id number] scope was introduced through the                            mouth, and advanced to the second part of duodenum.                            The upper GI endoscopy was accomplished without                            difficulty. The patient tolerated the procedure                            well. Scope In: Scope Out: Findings:      LA Grade B (one or more mucosal breaks greater than 5 mm, not extending       between the tops of two mucosal folds) esophagitis with no bleeding was       found in the distal esophagus.      Segmental moderate inflammation characterized by erosions was found on       the lesser curvature of the stomach.      One non-bleeding superficial gastric ulcer with adherent clot was found       on the lesser curvature of the stomach. The lesion was 5 mm in largest       dimension. Annular suggesting related to OG suction.      A bleeding mucosal tear that was pre-existing (pre-procedure) was found       on the greater curvature of the stomach. This was medium in size.       Estimated blood loss was minimal.      The examined duodenum was normal.      Segmental mild inflammation characterized by  congestion (edema) was       found in the gastric antrum.      - Moderate amount of dark bilious fluid in dependent portion of stomach. Impression:               - LA Grade B reflux esophagitis.                           - Acute gastritis.                           - Non-bleeding gastric ulcer with adherent clot.                           - Mucosal tear on the greater curvature of the                            stomach.                           - Normal examined duodenum.                           - Acute gastritis.                            - No specimens collected. Moderate Sedation:      per CCM (see above). Recommendation:           - NPO.                           - Give Protonix (pantoprazole): 8 mg/hr IV by                            continuous infusion.                           - Discontinue OG tube.                           - Observe patient's clinical course.                           - Post procedure medication orders were given. Procedure Code(s):        --- Professional ---                           [phone], Esophagogastroduodenoscopy, flexible,                            transoral; diagnostic, including collection of                            specimen(s) by brushing or washing, when performed                            (separate procedure) Diagnosis Code(s):        --- Professional ---  K92.0, Hematemesis                           K25.4, Chronic or unspecified gastric ulcer with                            hemorrhage                           K21.0, Gastro-esophageal reflux disease with                            esophagitis                           S36.39XA, Other injury of stomach, initial encounter                           K29.00, Acute gastritis without bleeding CPT copyright 2016 [employer]. All rights reserved. The codes documented in this report are preliminary and upon coder review may  be revised to meet current compliance requirements. Shirley Friar, MD 11/20/2016 9:54:26 AM This report has been signed electronically. Number of Addenda: 0

## 2016-11-17 NOTE — H&P (Addendum)
PULMONARY / CRITICAL CARE MEDICINE   Name: [name] MRN: [id number] DOB: 1945/08/04    ADMISSION DATE:  11/15/2016 CONSULTATION DATE:  12/12/2016  REFERRING MD:  Dr Verdie Mosher  CHIEF COMPLAINT:  Septic shock; Cardiac arrest; GIB; SDH  HISTORY OF PRESENT ILLNESS:   71yoF with Asthma/COPD, HTN, CKD, Spinal stenosis, CVA, Anxiety, Tobacco abuse, presented to ER today from nursing home where patient found down, surrounded by coffee ground emesis, with AMS and SOB. EMS was called and was taking her to the hospital when she developed cardiac arrest (PEA) en-route requiring CPR before attaining ROSC. On arrival to the ER she was found to be in profound shock (BP 41/30) with moderate amount of coffee grounds on OG tube. In the ER she was given 4u pRBC and 4L NS, Vancomycin, Zosyn. Central line placed by ER and Levophed started.    Report patient was last normal on Friday but was noted to have slurred speech on Sunday when they saw her. They also said she has been falling a lot and has been weak and not eating well. They said she has not been c/o CP or SOB or Abd pain to them.  PAST MEDICAL HISTORY :  She  has a past medical history of ALLERGIC RHINITIS; Arthritis; ASTHMA, UNSPECIFIED, UNSPECIFIED STATUS; DYSLIPIDEMIA; Headache(784.0); Hyperlipidemia (02/20/2009); HYPERTENSION; Hypertension; Mild renal insufficiency; PNA (pneumonia); Shortness of breath dyspnea; SPINAL STENOSIS, LUMBAR; and TOBACCO ABUSE.  PAST SURGICAL HISTORY:  She  has a past surgical history that includes Decompressive Laminectomy; Right hand (1995); Right knee (1990); Right video-assisted thoracoscopic surgery, drainage of empyema. (02/12/2011); Decortication of right lower lobe; Closure of bronchopleural fistula, right lower lobe.; Placement of wound On-Q analgesia irrigation system.; Colonoscopy; Anterior lat lumbar fusion (Right, 12/30/2015); Posterior lumbar fusion 4 level (N/A, 12/30/2015); Loop Recorder Insertion (N/A,  10/19/2016); and TEE without cardioversion (N/A, 10/19/2016).  Allergies  Allergen Reactions  . Shrimp [Shellfish Allergy] Hives and Swelling    No current facility-administered medications on file prior to encounter.    Current Outpatient Prescriptions on File Prior to Encounter  Medication Sig  . albuterol (PROVENTIL HFA;VENTOLIN HFA) 108 (90 Base) MCG/ACT inhaler Inhale 2 puffs into the lungs every 4 (four) hours as needed for wheezing or shortness of breath.  [name] aspirin 325 MG tablet Take 1 tablet (325 mg total) by mouth daily.  . clopidogrel (PLAVIX) 75 MG tablet Take 1 tablet (75 mg total) by mouth daily.  [name] diltiazem (DILACOR XR) 240 MG 24 hr capsule Take 240 mg by mouth daily.  [name] escitalopram (LEXAPRO) 10 MG tablet Take 10 mg by mouth daily.  . Fluticasone-Salmeterol (ADVAIR DISKUS) 250-50 MCG/DOSE AEPB Inhale 1 puff into the lungs 2 (two) times daily.    [name] losartan (COZAAR) 100 MG tablet Take 100 mg by mouth daily.   . polyethylene glycol (MIRALAX / GLYCOLAX) packet Take 17 g by mouth daily.  . QUEtiapine (SEROQUEL) 25 MG tablet Take 1 tablet (25 mg total) by mouth at bedtime.  . rosuvastatin (CRESTOR) 10 MG tablet Take 10 mg by mouth daily.   [name] senna-docusate (SENOKOT-S) 8.6-50 MG tablet Take 1 tablet by mouth 2 (two) times daily.  [name] SINGULAIR 10 MG tablet Take 1 tablet by mouth daily.  [name] thiamine (VITAMIN B-1) 100 MG tablet Take 100 mg by mouth daily.  [name] tiotropium (SPIRIVA) 18 MCG inhalation capsule Place 18 mcg into inhaler and inhale daily.      FAMILY HISTORY:  Her indicated that her mother is deceased.  She indicated that her father is deceased. She indicated that her sister is alive. She indicated that the status of her other is unknown.    SOCIAL HISTORY: She  reports that she has been smoking Cigarettes.  She has a 10.00 pack-year smoking history. She has never used smokeless tobacco. She reports that she does not drink alcohol or use drugs.  REVIEW OF SYSTEMS:    Review of Systems  Unable to perform ROS: Critical illness  Totally unresponsive   Subjective: Supine in bed unresponsive, non-sedated, no GAG  VITAL SIGNS: BP (!) 79/58   Pulse (!) 118   Temp 98.8 F (37.1 C) (Oral)   Resp (!) 26   Ht 5\' 5"  (1.651 m)   Wt 140 lb 14 oz (63.9 kg)   SpO2 98%   BMI 23.44 kg/m   HEMODYNAMICS:  Levophed @  VENTILATOR SETTINGS: Vent Mode: PRVC FiO2 (%):  [50 %-100 %] 50 % Set Rate:  [16 bmp-26 bmp] 26 bmp Vt Set:  [460 mL] 460 mL PEEP:  [5 cmH20] 5 cmH20 Plateau Pressure:  [19 cmH20-24 cmH20] 19 cmH20  INTAKE / OUTPUT: I/O last 3 completed shifts: In: 4500.7 [I.V.:4320.7; NG/GT:30; IV Piggyback:150] Out: 1950 [Urine:1950]  PHYSICAL EXAMINATION: General: Elderly AAF, orally intubated, full vent support, unresponsive without sedation Neuro: No response to sternal rub, Pupils fixed and dilated, No gag with deep [place]er  suction HEENT:  OG tube with small amount of coffee grounds actively being suctioned, otherwise normocephalic, Pupils fixed and dilated Cardiovascular: ST per telemetry,  RRR, no m/r/g, Lungs: Breath sounds coarse throughout, not assisting vent. Abdomen: soft upper abdomen,BS decreased, non-tender to palpation, non-distended Musculoskeletal: No obvious deformities, 1+ LE edema Skin: Warm dry and intact, no lesions or rash noted  LABS:  BMET  Recent Labs Lab 01-Dec-2016 1933 11/23/2016 0411  NA 143 146*  K 4.0 3.4*  CL 110 119*  CO2 22 21*  BUN 38* 34*  CREATININE 2.67* 2.03*  GLUCOSE 217* 173*   Electrolytes  Recent Labs Lab 2016/12/01 1933 11/28/2016 0411  CALCIUM 8.8* 8.1*  MG  --  2.0  PHOS  --  1.8*   CBC  Recent Labs Lab 12-01-16 1933 11/22/2016 0052  WBC 10.0 5.3  HGB 8.2* 12.9  HCT 26.4* 39.2  PLT 145* 98*   Coag's  Recent Labs Lab 2016/12/01 1959 11/16/2016 0052  APTT  --  33  INR 1.32  --    Sepsis Markers  Recent Labs Lab 2016/12/01 1930 11/27/2016 0052  LATICACIDVEN 3.33* 1.4   PROCALCITON  --  1.22   ABG  Recent Labs Lab 2016/12/01 2008 12/01/16 2218 11/22/2016 0405  PHART 7.105* 7.219* 7.387  PCO2ART 70.4* 46.9 31.5*  PO2ART 394.0* 78.0* 63.6*   Liver Enzymes  Recent Labs Lab 2016-12-01 1933  AST 34  ALT 13*  ALKPHOS 43  BILITOT 0.9  ALBUMIN 2.6*   Cardiac Enzymes  Recent Labs Lab 01-Dec-2016 1933  TROPONINI 0.04*   Glucose  Recent Labs Lab 11/27/2016 0106 12/15/2016 0414 11/17/2016 0723  GLUCAP 147* 166* 126*    Imaging Ct Head Wo Contrast  Result Date: 2016-12-01 CLINICAL DATA:  Found down. Agonal breathing. History of RIGHT posterior cerebral artery territory infarct, hypertension and dyslipidemia. EXAM: CT HEAD WITHOUT CONTRAST TECHNIQUE: Contiguous axial images were obtained from the base of the skull through the vertex without intravenous contrast. COMPARISON:  MRI of the head October 22, 2016 and CT HEAD October 21, 2016 FINDINGS: BRAIN: Dense 10 mm RIGHT  holo hemispheric subdural hematoma. Dense 5 mm falcotentorial subdural hematoma. 6 mm RIGHT to LEFT midline shift. RIGHT lateral ventricle effacement without entrapment. Global edema with basal cistern effacement. Subacute to chronic RIGHT temporal occipital lobe infarct with probable petechial hemorrhage. Subacute to old RIGHT thalamus and RIGHT splenium of the corpus callosum infarcts better seen on prior MRI. No intraparenchymal lobar hematoma. Patchy white matter changes compatible chronic small vessel ischemic disease better demonstrated on prior MRI. VASCULAR: Mild calcific atherosclerosis of the carotid siphons. SKULL: No skull fracture. Small to moderate LEFT frontoparietal subdural hematoma. SINUSES/ORBITS: Mild paranasal sinus mucosal thickening without air-fluid levels. Mastoid air cells are well aerated.The included ocular globes and orbital contents are non-suspicious. OTHER: None. IMPRESSION: 1. Acute to subacute RIGHT 10 mm holo hemispheric subdural hematoma and, 5 mm similar aged  falcotentorial subdural hematoma. 6 mm RIGHT to LEFT midline shift without entrapment. LEFT frontoparietal scalp hematoma, suggesting traumatic etiology . 2. Global edema, effaced basal cisterns. 3. Subacute to chronic RIGHT PCA territory (RIGHT thalamus and RIGHT temporal occipital lobe) infarcts with petechial hemorrhage . Critical Value/emergent results were called by telephone at the time of interpretation on November 17, 2016 at 9:25 pm to Dr. Azalia Bilis , who verbally acknowledged these results. Electronically Signed   By: Awilda Metro M.D.   On: 11/23/2016 21:36   Dg Chest Port 1 View  Result Date: 11/27/2016 CLINICAL DATA:  Respiratory failure. EXAM: PORTABLE CHEST 1 VIEW COMPARISON:  12/10/2016 . FINDINGS: Endotracheal tube tip 1.3 cm above the carina. Proximal repositioning of approximately 2 cm should be considered. NG tube noted with tip below left hemidiaphragm. Left subclavian line stable position. Heart size stable. Diffuse right lung infiltrate, progressed from prior exam. Left base infiltrate. Small right pleural effusion, new from prior exam. No pneumothorax. IMPRESSION: 1. Endotracheal tube tip 1.3 cm above the carina. Proximal repositioning of approximately 2 cm should be considered. NG tube noted with tip below left hemidiaphragm. Left subclavian line stable position. 2. Progressive diffuse right lung infiltrate. New small right pleural effusion. 3. Left base infiltrate again noted. Electronically Signed   By: Maisie Fus  Register   On: 12/04/2016 06:48   Dg Chest Portable 1 View  Result Date: 12/04/2016 CLINICAL DATA:  Central line placement EXAM: PORTABLE CHEST 1 VIEW COMPARISON:  12/03/2016, 6:59 p.m. FINDINGS: Left central venous line is identified with distal tip in the superior vena cava. Endotracheal tube is identified with distal tip 1.7 cm from carina. Nasogastric tube is identified with distal tip not included on the film but is at least in the stomach. Coarse right perihilar  opacities are identified. There is increased pulmonary interstitium bilaterally. There is no pneumothorax. The heart size is normal. IMPRESSION: Left central venous line distal tip in the superior vena cava. There is no pneumothorax. Pulmonary edema. Endotracheal tube and nasogastric tube as described. Electronically Signed   By: Sherian Rein M.D.   On: 17-Nov-2016 20:06   Dg Chest Portable 1 View  Result Date: 12/03/2016 CLINICAL DATA:  Post intubation.  Upper GI bleed. EXAM: PORTABLE CHEST 1 VIEW COMPARISON:  Radiograph 08/30/2016 FINDINGS: Endotracheal tube 2.1 cm from the carina. Enteric tube in place, tip below the diaphragm in the stomach, side-port not well visualized. Heart is normal in size, there is tortuosity and atherosclerosis of the thoracic aorta. Lower lung volumes from prior exam. Irregular coarse right perihilar opacities. No confluent airspace disease. Chronic blunting of right costophrenic angle. No pneumothorax. Remote right posterolateral rib fracture. Loop recorder projects  over the left chest wall. IMPRESSION: 1. Endotracheal tube 2.1 cm from the carina. Enteric tube in place, tip below the diaphragm in the stomach, side-port not well visualized. 2. Irregular coarse right perihilar opacities may be asymmetric pulmonary edema, pneumonia, atelectasis or aspiration. Electronically Signed   By: Rubye OaksMelanie  Ehinger M.D.   On: Aug 28, 2016 19:23   STUDIES:  Head CT: acute to subacute right SDH; 6mm Right to Left midline shift; global edema Brain MRI: Normal expected interval evolution of right PCA and smaller left cerebellar infarcts. No evidence for interval extension or expansion of infarction. No hemorrhagic transformation or significant mass Effect.No New intracranial process identified. Stable atrophy with moderate to advanced chronic microvascular ischemic disease  ANTIBIOTICS:  Vanc >> 7/2  Zosyn in ER 7/2 x 1 dose Cefepime >> 7/3  LINES/TUBES: Left Subclavian TLC (placed in  ER) 7.65mm ETT  ASSESSMENT / PLAN: 71yoF with Asthma/COPD, HTN, CKD, Spinal stenosis, CVA, Anxiety, Tobacco abuse, presented to ER today from nursing home where patient found down, surrounded by coffee ground emesis, with AMS and SOB, had cardiac arrest in route to the ER. Now with septic shock due to aspiration pneumonia, Upper GIB, SDH, and Signs of anoxic brain injury.   PULMONARY 1. Acute hypoxic and Acute hypercapneic respiratory failure; Aspiration pneumonia; Septic shock: CXR with progressive right lung infiltrate New small right pleural effusion L Base infiltrate  P: - continue mechanical ventilation, full support - increase FiO2 to 55% - Titirate Fio2 to maintain saturations > 93%   2. Asthma/COPD: Current Smoker - on spiriva, albuterol, and symbicort at home - start duonebs q6 and pulmicort/brovana nebs BID here  CARDIOVASCULAR 1. Cardiac arrest: - out of hospital cardiac arrest (3766m in PEA), s/p ROSC - not a candidate for therapeutic hypothermia given the GIB and SDH and Sepsis - TTE in 7/3 - s/p 4L IVF in ER; continue Levophed and wean as tolerated - Maintain MAP at 65: Soft pressure goal as gastric ulcer is linear - Trend troponin until WNL  2. History of HTN: currently in shock; continue to hold home BP meds   RENAL 1. AKI-on-CKD; Metabolic acidosis: Hypokalemia>> repleted Hypo phos>> repleted - creatinine 2.67>> 3.4>> up from a baseline of 1.0-1.5; likely related to ATN from the shock - place foley catheter  - continue IVF's; monitor strict I/O's - Avoid nephrotoxic medications - Assure renal perfusion -Trend BMET daily - HCO3 gtt  GASTROINTESTINAL 1. UGIB:   EGD shows linear gastric ulcer, Esophagitis and erosion P: - unclear quantity; reportedly was surrounded by coffee grounds at the scene, now with only small amount of active bleeding which is still coffee ground in nature - Hgb dropped from baseline 12.5-14 to now 8.2; s/p 4u pRBC in ER - Trend HGB  daily - Continue PPI gtt - Appreciate GI assist -  NPO - Remove OG per GI recommendation  HEMATOLOGIC 1. Anemia:  Received 4 U PRBC's in ED 7/2 P: - Trend CBC - Transfuse for HGB < 7  INFECTIOUS 1. Aspiration pneumonia:   P: - ABX as above  - Follow  Micro - Lactic acid down trending, continue to monitor - Trend PCT - s/p Vanc/Zosyn in ER; continue Vanc/Cefepime  ENDOCRINE 1. Hyperglycemia: - no history of DM; Blood glucose 217 here - start accuchecks and SSI  NEUROLOGIC 1. Acute SDH with shift; Acute encephalopathy; Anoxic encephalopathy: - Neurosurgery has been consulted; does not plan intervention - MRI brain as above, results noted - pupils fixed and dilated, concerning  for very severe anoxic encephalopathy - Consider cerebral blood flow studies per neuro recommendations   FAMILY  - Updates: 2 sisters and family members were updated at bedside. They are aware of poor prognosis. I have talked with them about considering change to DNR based on what she would want done in the event she could not speak for herself.  Bevelyn Ngo, AGACNP-BC Pulmonary and Critical Care Medicine [facility] Pager: [phone]  12/07/2016, 9:11 AM   STAFF NOTE: I, Rory Percy, MD FACP have personally reviewed patient's available data, including medical history, events of note, physical examination and test results as part of my evaluation. I have discussed with resident/NP and other care providers such as pharmacist, RN and RRT. In addition, I personally evaluated patient and elicited key findings of: no gag, no cough, pupils fixed and dilated, not breathing over vent, no movement to pain, ronchi rt base, abdo soft, no edema, appears she had GI bleeding leading to severe anemia, fall with SDH?, and arrest, now with anoxia severe with physical exam c/w brain death, no corneals  egd done, ulcer present , PPI drip, cbc to follow , transfusion done, NPO, NGT removed to not  irritate, SDH no surgical option, heroics would be futile, this does not appear to be survivable, hesitant to push for apnea testing until we allow continued to treat anemia, arf, may need repeat CT head , ABg reviewed, ph corrected, keep map 65 - levophed, NO SBT, no free water with brain edema risk, allow na to rise slight, get cvp, k supp, frequent cbc, likley to have apena testing in am, I have updated sister and daughter in room, add c collar, dc bicarb  I let daughter know we should make her dnr and provide comfort care, she will discuss with sisters The patient is critically ill with multiple organ systems failure and requires high complexity decision making for assessment and support, frequent evaluation and titration of therapies, application of advanced monitoring technologies and extensive interpretation of multiple databases.   Critical Care Time devoted to patient care services described in this note is 35 Minutes. This time reflects time of care of this signee: Rory Percy, MD FACP. This critical care time does not reflect procedure time, or teaching time or supervisory time of PA/NP/Med student/Med Resident etc but could involve care discussion time. Rest per NP/medical resident whose note is outlined above and that I agree with   Mcarthur Rossetti. Tyson Alias, MD, FACP Pgr: [phone] [facility] 12-07-2016 12:07 PM

## 2016-11-17 NOTE — H&P (View-Only) (Signed)
Referring Provider: Dr. Jimmey Ralph Primary Care Physician:  Charolette Forward, MD Primary Gastroenterologist:  Althia Forts  Reason for Consultation:  GI bleed  HPI: [name] is a 71 y.o. female found unresponsive at her rehab center and had coffee grounds emesis around her. Family does not know the extent of the coffee grounds emesis stating that the rehab staff did not tell them about any bleeding prior to this episode. Sister reports she took [facility] powders daily for years prior to admit to rehab center and has been at rehab center for a month. Family reports she has been falling a lot prior to admission to the rehab center and they have seen bruises on her. Daughter and sister are not aware of her having any peptic ulcer disease in the past. She has not had an EGD or colonoscopy in the past. Patient is sedated and intubated. No melena since admit. She is having small amounts of black fluid being aspirated from her OG tube. Cardiac arrest occurred en route to ER with PEA and on arrival she was in shock with a BP of 41/30 and reports of a moderate amount of coffee grounds emesis in the OG tube. Hgb 8.2 in ER and transfused 4 U PRBCs and 12.9 at midnight. Patient on Levophed.  Past Medical History:  Diagnosis Date  . ALLERGIC RHINITIS   . Arthritis   . ASTHMA, UNSPECIFIED, UNSPECIFIED STATUS   . DYSLIPIDEMIA   . Headache(784.0)   . Hyperlipidemia 02/20/2009   Qualifier: Diagnosis of  By: Asa Lente MD, Jannifer Rodney HYPERTENSION   . Hypertension   . Mild renal insufficiency   . PNA (pneumonia)   . Shortness of breath dyspnea   . SPINAL STENOSIS, LUMBAR    MRI 12/2005; surg 2004  . TOBACCO ABUSE     Past Surgical History:  Procedure Laterality Date  . ANTERIOR LAT LUMBAR FUSION Right 12/30/2015   Procedure: Lumbar two-three, Lumbar three-four Anterior Lateral Lumbar Interbody Fusion with Anterior column realignment at Lumbar three-four;  Surgeon: Kevan Ny Ditty, MD;  Location: [place] [facility]  [facility];  Service: Neurosurgery;  Laterality: Right;  . Closure of bronchopleural fistula, right lower lobe.    [name] COLONOSCOPY    . Decompressive Laminectomy     Fusion L4-5, L5-S1 ([facility]-2004)  . Decortication of right lower lobe    . LOOP RECORDER INSERTION N/A 10/19/2016   Procedure: Loop Recorder Insertion;  Surgeon: Deboraha Sprang, MD;  Location: [place] CV LAB;  Service: Cardiovascular;  Laterality: N/A;  . Placement of wound On-Q analgesia irrigation system.    [name] POSTERIOR LUMBAR FUSION 4 LEVEL N/A 12/30/2015   Procedure: Lumbar two -Sacral one Dorsal Internal Fixation and Fusion, Charline Bills osteotomies Lumbar two-three, Lumbar three-four;  Surgeon: Kevan Ny Ditty, MD;  Location: [place] [facility]UR[facility] ORS;  Service: Neurosurgery;  Laterality: N/A;  . Right hand  1995  . Right knee  1990  . Right video-assisted thoracoscopic surgery, drainage of empyema.  02/12/2011  . TEE WITHOUT CARDIOVERSION N/A 10/19/2016   Procedure: TRANSESOPHAGEAL ECHOCARDIOGRAM (TEE);  Surgeon: Dixie Dials, MD;  Location: [facility] ENDOSCOPY;  Service: Cardiovascular;  Laterality: N/A;    Prior to Admission medications   Medication Sig Start Date End Date Taking? Authorizing Provider  albuterol (PROVENTIL HFA;VENTOLIN HFA) 108 (90 Base) MCG/ACT inhaler Inhale 2 puffs into the lungs every 4 (four) hours as needed for wheezing or shortness of breath. 08/30/16  Yes Forde Dandy, MD  aspirin 325 MG tablet Take 1 tablet (  325 mg total) by mouth daily. 10/21/16  Yes Eugenie Filler, MD  clopidogrel (PLAVIX) 75 MG tablet Take 1 tablet (75 mg total) by mouth daily. 10/21/16  Yes Eugenie Filler, MD  diltiazem (DILACOR XR) 240 MG 24 hr capsule Take 240 mg by mouth daily.   Yes [provider]  escitalopram (LEXAPRO) 10 MG tablet Take 10 mg by mouth daily.   Yes [provider]  Fluticasone-Salmeterol (ADVAIR DISKUS) 250-50 MCG/DOSE AEPB Inhale 1 puff into the lungs 2 (two) times daily.     Yes [provider]  losartan (COZAAR) 100 MG tablet Take 100 mg by mouth daily.  07/27/16  Yes [provider]  polyethylene glycol (MIRALAX / GLYCOLAX) packet Take 17 g by mouth daily. 10/21/16  Yes Eugenie Filler, MD  QUEtiapine (SEROQUEL) 25 MG tablet Take 1 tablet (25 mg total) by mouth at bedtime. 10/23/16  Yes Johnson, Clanford L, MD  rosuvastatin (CRESTOR) 10 MG tablet Take 10 mg by mouth daily.    Yes [provider]  senna-docusate (SENOKOT-S) 8.6-50 MG tablet Take 1 tablet by mouth 2 (two) times daily. 10/20/16  Yes Eugenie Filler, MD  SINGULAIR 10 MG tablet Take 1 tablet by mouth daily. 07/02/16  Yes [provider]  thiamine (VITAMIN B-1) 100 MG tablet Take 100 mg by mouth daily. 04/30/16  Yes [provider]  tiotropium (SPIRIVA) 18 MCG inhalation capsule Place 18 mcg into inhaler and inhale daily.     Yes [provider]    Scheduled Meds: . arformoterol  15 mcg Nebulization BID  . budesonide (PULMICORT) nebulizer solution  0.5 mg Nebulization BID  . chlorhexidine gluconate (MEDLINE KIT)  15 mL Mouth Rinse BID  . insulin aspart  2-6 Units Subcutaneous Q4H  . ipratropium-albuterol  3 mL Nebulization Q6H  . mouth rinse  15 mL Mouth Rinse QID  . [START ON 11/20/2016] pantoprazole  40 mg Intravenous Q12H   Continuous Infusions: . sodium chloride    . ceFEPime (MAXIPIME) IV Stopped (11/24/2016 0330)  . norepinephrine (LEVOPHED) Adult infusion 7 mcg/min (11/27/2016 0800)  . pantoprozole (PROTONIX) infusion 8 mg/hr (11/29/2016 0740)  . potassium phosphate IVPB (mmol) 30 mmol (12/01/2016 0746)  . propofol (DIPRIVAN) infusion Stopped (11/27/2016 2215)  .  sodium bicarbonate (isotonic) infusion in sterile water 75 mL/hr at 11/28/2016 0800  . [START ON 12-06-16] vancomycin     PRN Meds:.sodium chloride, acetaminophen, docusate, fentaNYL (SUBLIMAZE) injection, fentaNYL (SUBLIMAZE) injection, midazolam, midazolam, ondansetron (ZOFRAN) IV  Allergies as of 12/05/2016 -  Review Complete 11/24/2016  Allergen Reaction Noted  . Shrimp [shellfish allergy] Hives and Swelling 12/21/2013    Family History  Problem Relation Age of Onset  . Mental illness Mother   . Hypertension Mother   . Arthritis Other   . Hypertension Other     Social History   Social History  . Marital status: Single    Spouse name: N/A  . Number of children: N/A  . Years of education: N/A   Occupational History  . Not on file.   Social History Main Topics  . Smoking status: Current Every Day Smoker    Packs/day: 0.25    Years: 40.00    Types: Cigarettes  . 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 Alcohol use No  . Drug use: No  . Sexual activity: No     Comment: no sex x  20 yrs   Other Topics Concern  . Not on file   Social History Narrative  . No narrative on file    Review of Systems: Unable to obtain  Physical Exam: Vital signs: Vitals:   11/27/2016 0739 11/28/2016 0800  BP:  91/65  Pulse:  (!) 116  Resp:  (!) 26  Temp: 98.8 F (37.1 C)      General:   Sedated intubated, well-nourished HEENT: dry mouth Lungs:  Coarse breath sounds  Heart:  Regular rate and rhythm; no murmurs, clicks, rubs,  or gallops. Abdomen: soft, mild distention, nontender (no facial grimace), +BS  Rectal:  Deferred Ext: +LE edema  GI:  Lab Results:  Recent Labs  11/23/2016 1933 11/20/2016 0052  WBC 10.0 5.3  HGB 8.2* 12.9  HCT 26.4* 39.2  PLT 145* 98*   BMET  Recent Labs  11/20/2016 1933 11/26/2016 0411  NA 143 146*  K 4.0 3.4*  CL 110 119*  CO2 22 21*  GLUCOSE 217* 173*  BUN 38* 34*  CREATININE 2.67* 2.03*  CALCIUM 8.8* 8.1*   LFT  Recent Labs  12/02/2016 1933  PROT 5.2*  ALBUMIN 2.6*  AST 34  ALT 13*  ALKPHOS 43  BILITOT 0.9   PT/INR  Recent Labs  12/07/2016 1959  LABPROT 16.5*  INR 1.32     Studies/Results: Ct Head Wo Contrast  Result Date: 12/12/2016 CLINICAL DATA:  Found  down. Agonal breathing. History of RIGHT posterior cerebral artery territory infarct, hypertension and dyslipidemia. EXAM: CT HEAD WITHOUT CONTRAST TECHNIQUE: Contiguous axial images were obtained from the base of the skull through the vertex without intravenous contrast. COMPARISON:  MRI of the head October 22, 2016 and CT HEAD October 21, 2016 FINDINGS: BRAIN: Dense 10 mm RIGHT holo hemispheric subdural hematoma. Dense 5 mm falcotentorial subdural hematoma. 6 mm RIGHT to LEFT midline shift. RIGHT lateral ventricle effacement without entrapment. Global edema with basal cistern effacement. Subacute to chronic RIGHT temporal occipital lobe infarct with probable petechial hemorrhage. Subacute to old RIGHT thalamus and RIGHT splenium of the corpus callosum infarcts better seen on prior MRI. No intraparenchymal lobar hematoma. Patchy white matter changes compatible chronic small vessel ischemic disease better demonstrated on prior MRI. VASCULAR: Mild calcific atherosclerosis of the carotid siphons. SKULL: No skull fracture. Small to moderate LEFT frontoparietal subdural hematoma. SINUSES/ORBITS: Mild paranasal sinus mucosal thickening without air-fluid levels. Mastoid air cells are well aerated.The included ocular globes and orbital contents are non-suspicious. OTHER: None. IMPRESSION: 1. Acute to subacute RIGHT 10 mm holo hemispheric subdural hematoma and, 5 mm similar aged falcotentorial subdural hematoma. 6 mm RIGHT to LEFT midline shift without entrapment. LEFT frontoparietal scalp hematoma, suggesting traumatic etiology . 2. Global edema, effaced basal cisterns. 3. Subacute to chronic RIGHT PCA territory (RIGHT thalamus and RIGHT temporal occipital lobe) infarcts with petechial hemorrhage . Critical Value/emergent results were called by telephone at the time of interpretation on 12/05/2016 at 9:25 pm to Dr. Jola Schmidt , who verbally acknowledged these results. Electronically Signed   By: Elon Alas M.D.   On:  11/30/2016 21:36   Dg Chest Port 1 View  Result Date: 12/15/2016 CLINICAL DATA:  Respiratory failure. EXAM: PORTABLE CHEST 1 VIEW COMPARISON:  11/20/2016 . FINDINGS: Endotracheal tube tip 1.3 cm above the carina. Proximal repositioning of approximately 2 cm should be considered. NG tube noted with tip below left hemidiaphragm. Left subclavian line stable position. Heart size stable. Diffuse right lung infiltrate, progressed from prior exam. Left base infiltrate. Small  right pleural effusion, new from prior exam. No pneumothorax. IMPRESSION: 1. Endotracheal tube tip 1.3 cm above the carina. Proximal repositioning of approximately 2 cm should be considered. NG tube noted with tip below left hemidiaphragm. Left subclavian line stable position. 2. Progressive diffuse right lung infiltrate. New small right pleural effusion. 3. Left base infiltrate again noted. Electronically Signed   By: Marcello Moores  Register   On: 11/27/2016 06:48   Dg Chest Portable 1 View  Result Date: 11/28/2016 CLINICAL DATA:  Central line placement EXAM: PORTABLE CHEST 1 VIEW COMPARISON:  November 16, 2016, 6:59 p.m. FINDINGS: Left central venous line is identified with distal tip in the superior vena cava. Endotracheal tube is identified with distal tip 1.7 cm from carina. Nasogastric tube is identified with distal tip not included on the film but is at least in the stomach. Coarse right perihilar opacities are identified. There is increased pulmonary interstitium bilaterally. There is no pneumothorax. The heart size is normal. IMPRESSION: Left central venous line distal tip in the superior vena cava. There is no pneumothorax. Pulmonary edema. Endotracheal tube and nasogastric tube as described. Electronically Signed   By: Abelardo Diesel M.D.   On: 12/09/2016 20:06   Dg Chest Portable 1 View  Result Date: 12/01/2016 CLINICAL DATA:  Post intubation.  Upper GI bleed. EXAM: PORTABLE CHEST 1 VIEW COMPARISON:  Radiograph 08/30/2016 FINDINGS: Endotracheal  tube 2.1 cm from the carina. Enteric tube in place, tip below the diaphragm in the stomach, side-port not well visualized. Heart is normal in size, there is tortuosity and atherosclerosis of the thoracic aorta. Lower lung volumes from prior exam. Irregular coarse right perihilar opacities. No confluent airspace disease. Chronic blunting of right costophrenic angle. No pneumothorax. Remote right posterolateral rib fracture. Loop recorder projects over the left chest wall. IMPRESSION: 1. Endotracheal tube 2.1 cm from the carina. Enteric tube in place, tip below the diaphragm in the stomach, side-port not well visualized. 2. Irregular coarse right perihilar opacities may be asymmetric pulmonary edema, pneumonia, atelectasis or aspiration. Electronically Signed   By: Jeb Levering M.D.   On: 11/24/2016 19:23    Impression/Plan: Hemorrhagic shock with cardiac arrest en route to ER - question peptic ulcer bleed vs. Esophagitis. Continue Protonix drip. Bedside EGD this morning. Supportive care. Risks/benefits of EGD discussed with family and they agree to proceed.    LOS: 1 day   Trent C.  12/08/2016, 8:17 AM  Pager [phone]  AFTER 5 pm or on weekends please call [phone]

## 2016-11-17 NOTE — Brief Op Note (Signed)
   Submucosal gastric tear with small amount of oozing of blood. Scattered annular erosions from OG suctioning. Small annular ulcer on lesser curvature likely from OG suctioning. Esophagitis noted. See endopro note for details/recs. D/C OG tube. Continue Protonix drip. Will d/w family. Dr. Ewing SchleinMagod will f/u tomorrow.

## 2016-11-17 NOTE — Interval H&P Note (Signed)
History and Physical Interval Note:  12/04/2016 9:00 AM  [name]  has presented today for surgery, with the diagnosis of Gi bleed  The various methods of treatment have been discussed with the patient and family. After consideration of risks, benefits and other options for treatment, the patient has consented to  Procedure(s): ESOPHAGOGASTRODUODENOSCOPY (EGD) (N/A) as a surgical intervention .  The patient's history has been reviewed, patient examined, no change in status, stable for surgery.  I have reviewed the patient's chart and labs.  Questions were answered to the patient's satisfaction.     Mackenzie Madruga C.

## 2016-11-18 ENCOUNTER — Encounter (HOSPITAL_COMMUNITY): Payer: Self-pay | Admitting: Gastroenterology

## 2016-11-18 ENCOUNTER — Inpatient Hospital Stay (HOSPITAL_COMMUNITY): Payer: Medicare Other

## 2016-11-18 DIAGNOSIS — R6521 Severe sepsis with septic shock: Secondary | ICD-10-CM

## 2016-11-18 DIAGNOSIS — A419 Sepsis, unspecified organism: Principal | ICD-10-CM

## 2016-11-18 LAB — CBC
HCT: 37.3 % (ref 36.0–46.0)
Hemoglobin: 12 g/dL (ref 12.0–15.0)
MCH: 29.2 pg (ref 26.0–34.0)
MCHC: 32.2 g/dL (ref 30.0–36.0)
MCV: 90.8 fL (ref 78.0–100.0)
PLATELETS: 106 10*3/uL — AB (ref 150–400)
RBC: 4.11 MIL/uL (ref 3.87–5.11)
RDW: 17.3 % — AB (ref 11.5–15.5)
WBC: 11 10*3/uL — ABNORMAL HIGH (ref 4.0–10.5)

## 2016-11-18 LAB — GLUCOSE, CAPILLARY
GLUCOSE-CAPILLARY: 133 mg/dL — AB (ref 65–99)
GLUCOSE-CAPILLARY: 150 mg/dL — AB (ref 65–99)

## 2016-11-18 LAB — PROCALCITONIN: PROCALCITONIN: 3.83 ng/mL

## 2016-11-18 LAB — BASIC METABOLIC PANEL
ANION GAP: 8 (ref 5–15)
BUN: 25 mg/dL — AB (ref 6–20)
CALCIUM: 8 mg/dL — AB (ref 8.9–10.3)
CO2: 24 mmol/L (ref 22–32)
CREATININE: 1.65 mg/dL — AB (ref 0.44–1.00)
Chloride: 125 mmol/L — ABNORMAL HIGH (ref 101–111)
GFR calc Af Amer: 36 mL/min — ABNORMAL LOW (ref >60)
GFR, EST NON AFRICAN AMERICAN: 31 mL/min — AB (ref >60)
GLUCOSE: 154 mg/dL — AB (ref 65–99)
Potassium: 2.8 mmol/L — ABNORMAL LOW (ref 3.5–5.1)
Sodium: 157 mmol/L — ABNORMAL HIGH (ref 135–145)

## 2016-11-18 LAB — LACTIC ACID, PLASMA
LACTIC ACID, VENOUS: 1.7 mmol/L (ref 0.5–1.9)
LACTIC ACID, VENOUS: 1.9 mmol/L (ref 0.5–1.9)

## 2016-11-18 LAB — TROPONIN I: TROPONIN I: 0.05 ng/mL — AB (ref <0.03)

## 2016-11-18 MED ORDER — MORPHINE SULFATE 25 MG/ML IV SOLN
10.0000 mg/h | INTRAVENOUS | Status: DC
  Administered 2016-11-18: 10 mg/h via INTRAVENOUS
  Filled 2016-11-18: qty 10

## 2016-11-18 MED ORDER — MORPHINE BOLUS VIA INFUSION
5.0000 mg | INTRAVENOUS | Status: DC | PRN
  Filled 2016-11-18: qty 20

## 2016-11-18 MED ORDER — ATROPINE SULFATE 1 % OP SOLN
2.0000 [drp] | Freq: Four times a day (QID) | OPHTHALMIC | Status: DC | PRN
  Filled 2016-11-18: qty 2

## 2016-11-20 LAB — BLOOD GAS, ARTERIAL
ACID-BASE DEFICIT: 2.5 mmol/L — AB (ref 0.0–2.0)
Bicarbonate: 22.4 mmol/L (ref 20.0–28.0)
DRAWN BY: 41977
FIO2: 55
O2 Saturation: 96.3 %
PEEP: 5 cmH2O
PH ART: 7.338 — AB (ref 7.350–7.450)
Patient temperature: 98.6
RATE: 26 resp/min
VT: 460 mL
pCO2 arterial: 42.8 mmHg (ref 32.0–48.0)
pO2, Arterial: 87.9 mmHg (ref 83.0–108.0)

## 2016-11-20 LAB — CULTURE, RESPIRATORY W GRAM STAIN

## 2016-11-20 LAB — CULTURE, RESPIRATORY

## 2016-11-22 LAB — CULTURE, BLOOD (ROUTINE X 2)
Culture: NO GROWTH
Culture: NO GROWTH
SPECIAL REQUESTS: ADEQUATE
Special Requests: ADEQUATE

## 2016-12-16 NOTE — Progress Notes (Signed)
Chaplain was asked to provide care for a family who has decided to extubate and move towards comfort care for their mother and loved one.  Chaplain provided words of comfort at bedside and ministry of presence.  Family giving support to one another and relying on one another for love and comfort.  Chaplain present while MD explained the extubation process with the family.  Chaplain will notify next Chaplain who is coming on board at shift change.    Thank you to all staff who has provided care for this family.    12/05/2016 0826  Clinical Encounter Type  Visited With Family  Visit Type Follow-up;Spiritual support;Critical Care  Referral From Nurse  Consult/Referral To Chaplain  Spiritual Encounters  Spiritual Needs Grief support;Emotional  Stress Factors  Family Stress Factors Loss

## 2016-12-16 NOTE — Progress Notes (Signed)
Morphine infusion initiated for comfort care with family at bedside. Emotional support given and I discussed the extubation process with them again

## 2016-12-16 NOTE — Progress Notes (Signed)
[facility] to support family at bedside. Patient deceased.  Prayed with family and offered final blessing. Provided ministry of presence, guidance, emotional, grief and spiritual support.   Venida JarvisWatlington, Jennavie Martinek, [place], [facility], Pager [phone]

## 2016-12-16 NOTE — Progress Notes (Signed)
Decision about Comfort Care reviewed and case discussed with my partner Dr. Bosie ClosSchooler please let us know if we can be of any further assistance with this hospital stay

## 2016-12-16 NOTE — Procedures (Signed)
Extubation Procedure Note  Patient Details:   Name: Amalia Haileyoni A Blanton DOB: [birth year] MRN: [id number]   Airway Documentation:     Evaluation  O2 sats: stable throughout Complications: No apparent complications Patient did tolerate procedure well. Bilateral Breath Sounds: Clear, Diminished   No  Suzan GaribaldiCraddock, Haeleigh Streiff Ann 11/19/2016, 8:58 AM

## 2016-12-16 NOTE — Discharge Summary (Signed)
DEATH NOTE: For a complete accounting of the patient's history and physical exam on presentation please refer to the H&P dictated on 11/26/2016. Patient was a 71 year old female with known history of chronic renal failure, asthma/COPD, essential hypertension, spinal stenosis, and tobacco use disorder. Patient was found down by staff at her nursing home surrounded by coffee ground emesis. Patient had altered mental status and shortness of breath. EMS was subsequently contacted for transport to the hospital. Patient suffered a pulseless electrical activity cardiac arrest on route to the emergency department requiring 10 minutes of CPR before return of spontaneous circulation. Upon arrival patient was in a state of shock. Central line was placed for vasopressor infusionand underwent resuscitation with packed red blood cells and crystalloid IV fluids. Family reported patient was normal on Sunday prior to presentation and had been falling frequently with increased weakness and poor by mouth intake. Patient was started on empiric broad-spectrum antibiotics with vancomycin and cefepime. GI was consulted and patient underwent upper endoscopy showing a submucosal gastric tear with a small amount of oozing as well as a small gastric ulcer and esophagitis. Patient had signs of anoxic brain injury and CT imaging of the brain on presentation revealed an acute to subacute right holohemispheric subdural hematoma as well as right to left midline shift. Patient had global edema with effacement of basal cisterns. Neurosurgery was consulted and felt that this was not a survivable injury. There was no indication for surgery as it would be futile and this was reportedly explained to the family. After further discussions with the family and continued respiratory failure and shock family decided to transition to full comfort care with a terminal ventilator wean and one way extubation. Patient underwent extubation at approximately 9 AM on  11/26/2016 with family members at bedside. Patient passed some time before 9:54 AM on 11/19/2016 per documentation from hospital chaplain who is made available to the patient's family for spiritual support. Family desired investigation with autopsy and case was referred to the medical examiner who accepted.   DIAGNOSES AT DEATH: 1. Acute hypoxic and hypercarbic respiratory failure 2. Acute on chronic renal failure unknown stage 3. Septic Shock 4. Aspiration pneumonia 5. Acute encephalopathy multifactorial in etiology 6. Anoxic brain injury with cerebral edema 7. Subdural hematoma with midline shift 8. Acute gastrointestinal bleed 9. History of asthma/COPD 10. History of dyslipidemia 11. History of essential hypertension 12. History of spinal stenosis 13. History of tobacco use disorder 14. History of stroke

## 2016-12-16 NOTE — Progress Notes (Signed)
[facility] Attending Note  ADMISSION DATE:  11/26/2016  CONSULTATION DATE:  12/02/2016  REFERRING MD:  Dr Oleta Mouse  CHIEF COMPLAINT:  Septic shock; Cardiac arrest; GIB; SDH  Presenting HPI:  71 y.o. female with Asthma/COPD, HTN, CKD, Spinal stenosis, CVA, Anxiety, Tobacco abuse, presented to ER today from nursing home where patient found down, surrounded by coffee ground emesis, with AMS and SOB. EMS was called and was taking her to the hospital when she developed cardiac arrest (PEA) en-route requiring 71mn CPR before attaining ROSC. On arrival to the ER she was found to be in profound shock (BP 41/30) with moderate amount of coffee grounds on OG tube. In the ER she was given 4u pRBC and 4L NS, Vancomycin, Zosyn. Central line placed by ER and Levophed started.   At the time of my exam, 2 of patient's daughters are at the bedside and report patient was last normal on Friday but was noted to have slurred speech on Sunday when they saw her. They also said she has been falling a lot and has been weak and not eating well. They said she has not been c/o CP or SOB or Abd pain to them. They were on their way into the nursing home to see her today when EMS passed them, transporting patient to the hospital.   Subjective:  No acute events overnight. Patient transitioned to a DNR status after family discussion late yesterday.   Review of Systems:  Unable to obtain given altered mentation.   Vent Mode: PRVC FiO2 (%):  [50 %-55 %] 55 % Set Rate:  [26 bmp] 26 bmp Vt Set:  [460 mL] 460 mL PEEP:  [5 cmH20] 5 cmH20 Plateau Pressure:  [18 cmH20-20 cmH20] 20 cmH20  Temp:  [97.4 F (36.3 C)-99.2 F (37.3 C)] 97.4 F (36.3 C) (07/04 0319) Pulse Rate:  [83-124] 83 (07/04 0630) Resp:  [23-31] 26 (07/04 0630) BP: (73-127)/(52-89) 127/89 (07/04 0630) SpO2:  [96 %-99 %] 98 % (07/04 0630) FiO2 (%):  [50 %-55 %] 55 % (07/04 0738) Weight:  [136 lb 14.5 oz (62.1 kg)] 136 lb 14.5 oz (62.1 kg)  (07/04 0200)  General:  Multiple family members at bedside. Intubated. No distress. Integument:  Warm & dry. No rash on exposed skin. HEENT:  Moist mucus memebranes. No scleral icterus. Endotracheal tube in place. Neurological:  Pupils symmetric. No spontaneous movements. Musculoskeletal:  No joint effusion or erythema appreciated. Symmetric muscle bulk. Pulmonary:  Symmetric chest wall rise on ventilator. Coarse breath sounds bilaterally. Cardiovascular:  Regular rate. No appreciable JVD. Normal S1 & S2. Abdomen:  Soft. Nondistended. Hypoactive bowel sounds.  LINES/TUBES: OETT 7.5 7/2 >>> L Coal CVL 7/2 >>> Foley 7/2 >>> PIV  CBC Latest Ref Rng & Units 7July 11, 20187/07/2016 12/12/2016  WBC 4.0 - 10.5 K/uL 11.0(H) 5.3 10.0  Hemoglobin 12.0 - 15.0 g/dL 12.0 12.9 8.2(L)  Hematocrit 36.0 - 46.0 % 37.3 39.2 26.4(L)  Platelets 150 - 400 K/uL 106(L) 98(L) 145(L)    BMP Latest Ref Rng & Units 707/11/20187/07/2016 12/04/2016  Glucose 65 - 99 mg/dL 154(H) 173(H) 217(H)  BUN 6 - 20 mg/dL 25(H) 34(H) 38(H)  Creatinine 0.44 - 1.00 mg/dL 1.65(H) 2.03(H) 2.67(H)  BUN/Creat Ratio 12 - 28 - - -  Sodium 135 - 145 mmol/L 157(H) 146(H) 143  Potassium 3.5 - 5.1 mmol/L 2.8(L) 3.4(L) 4.0  Chloride 101 - 111 mmol/L 125(H) 119(H) 110  CO2 22 - 32 mmol/L 24 21(L) 22  Calcium 8.9 -  10.3 mg/dL 8.0(L) 8.1(L) 8.8(L)    Hepatic Function Latest Ref Rng & Units 11/19/2016 10/21/2016 10/16/2016  Total Protein 6.5 - 8.1 g/dL 5.2(L) 8.0 6.7  Albumin 3.5 - 5.0 g/dL 2.6(L) 4.4 3.3(L)  AST 15 - 41 U/L 34 23 20  ALT 14 - 54 U/L 13(L) 12(L) 11(L)  Alk Phosphatase 38 - 126 U/L 43 62 52  Total Bilirubin 0.3 - 1.2 mg/dL 0.9 0.7 0.7  Bilirubin, Direct 0.1 - 0.5 mg/dL - - -    IMAGING/STUDIES: CT HEAD W/O 7/2: IMPRESSION: 1. Acute to subacute RIGHT 10 mm holo hemispheric subdural hematoma and, 5 mm similar aged falcotentorial subdural hematoma. 6 mm RIGHT to LEFT midline shift without entrapment. LEFT frontoparietal scalp  hematoma, suggesting traumatic etiology. 2. Global edema, effaced basal cisterns. 3. Subacute to chronic RIGHT PCA territory (RIGHT thalamus and RIGHT temporal occipital lobe) infarcts with petechial hemorrhage. TTE 7/3:  Moderate LVH with EF 60-65%. Normal wall motion. Grade 1 diastolic dysfunction. LA & RA normal in size. RV normal in size and function. Aortic valve poorly visualized but without obvious stenosis. Aortic root normal in size. Pulmonic valve poorly visualized but without significant regurgitation. Mild tricuspid regurgitation. No pericardial effusion. PORT CXR 7/4:  Personally reviewed by me. Endotracheal tube and left subclavian central venous catheter in good position. Improving aeration of the right lung with decreased right basilar opacity. Persistent mild left patchy basilar opacity. No obvious pleural effusion.  MICROBIOLOGY: MRSA PCR 7/3:  Negative  Blood Cultures x2 7/3 >>> Tracheal Aspirate Culture 7/3 >>>  ANTIBIOTICS: Cefepime 7/3 >>> Vancomycin 7/2 >>>  SIGNIFICANT EVENTS: 07/02 - Admit 07/03 - EGD w/ submucosal gastric tear with small amount of oozing as well as small annular ulcer on lesser curvature likely from suctioning of OGT. Esophagitis noted.  ASSESSMENT/PLAN:  71 y.o. female with history of multiple medical problems including essential hypertension, asthma/COPD, prior stroke, chronic renal failure, and tobacco use disorder. Presented after being found in nursing home surrounded by coffee ground emesis. Patient had altered mental status and was also short of breath. Patient suffered cardiac arrest en route to the emergency room. Patient has had signs of anoxic brain injury as well as aspiration pneumonia with shock that is slowly improving. Patient also found to have a subdural hematoma on head CT imaging.  1. Acute hypoxic respiratory failure: Plan for terminal ventilator wean and one way extubation this morning. Utilizing morphine as needed for any signs of  respiratory distress or discomfort. 2. Septic shock: Discontinuing vasopressors as we transition to full comfort care this morning. 3. Subdural hematoma with intracranial hypertension: Previously evaluated by neurosurgery. Devastating injury. No sedation since approximately 10:15 PM on 7/2. 4. Upper GI bleed: Secondary to esophagitis and linear gastric ulcer as noted above. Plan to discontinue Protonix drip as we transition to full comfort.  Prophylaxis:  SCDs & Protonix drip.  Diet:  NPO. Code Status:  DNR.  Disposition:  Transitioning to full comfort care this morning with terminal ventilator wean & one-way extubation. CDS previously notified. Hospital chaplain previously notified as well. Family Update:  Multiple family members at bedside updated during rounds by me this morning.  I have personally spent a total of 31 minutes of critical care time today caring for the patient, updating family at bedside, & reviewing the patient's electronic medical record.  Sonia Baller Ashok Cordia, M.D. [facility] Pager:  [phone] After 3pm or if no response, call [phone] 7:39 AM 2016-11-30

## 2016-12-16 NOTE — Progress Notes (Signed)
Wasted 245 mls or morphine in sink. Witnessed by Acey LavPhyllis McKinney RN  Felipa EmoryJarrell, Katria Botts Denise

## 2016-12-16 DEATH — deceased

## 2017-06-23 IMAGING — RF DG C-ARM 61-120 MIN
1 series · 3 of 3 positions shown · non-contrast
Comparison: Lumbar spine MR dated 11/21/2015.

CLINICAL DATA: L2 through L4 fusion.

EXAM:
DG C-ARM 61-120 MIN; LUMBAR SPINE - 2-3 VIEW

[Series 1: run · 3 of 3 slices shown]
[im 1/3]
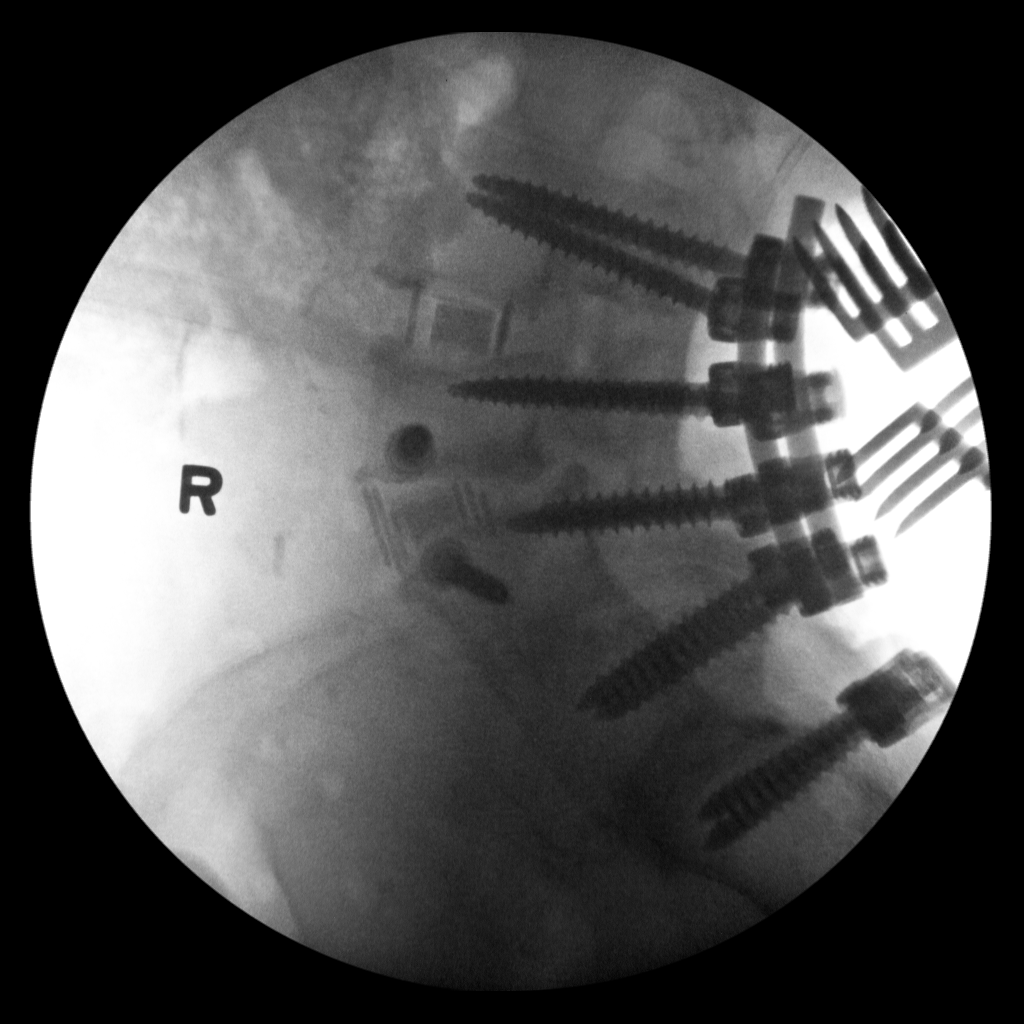
[im 2/3]
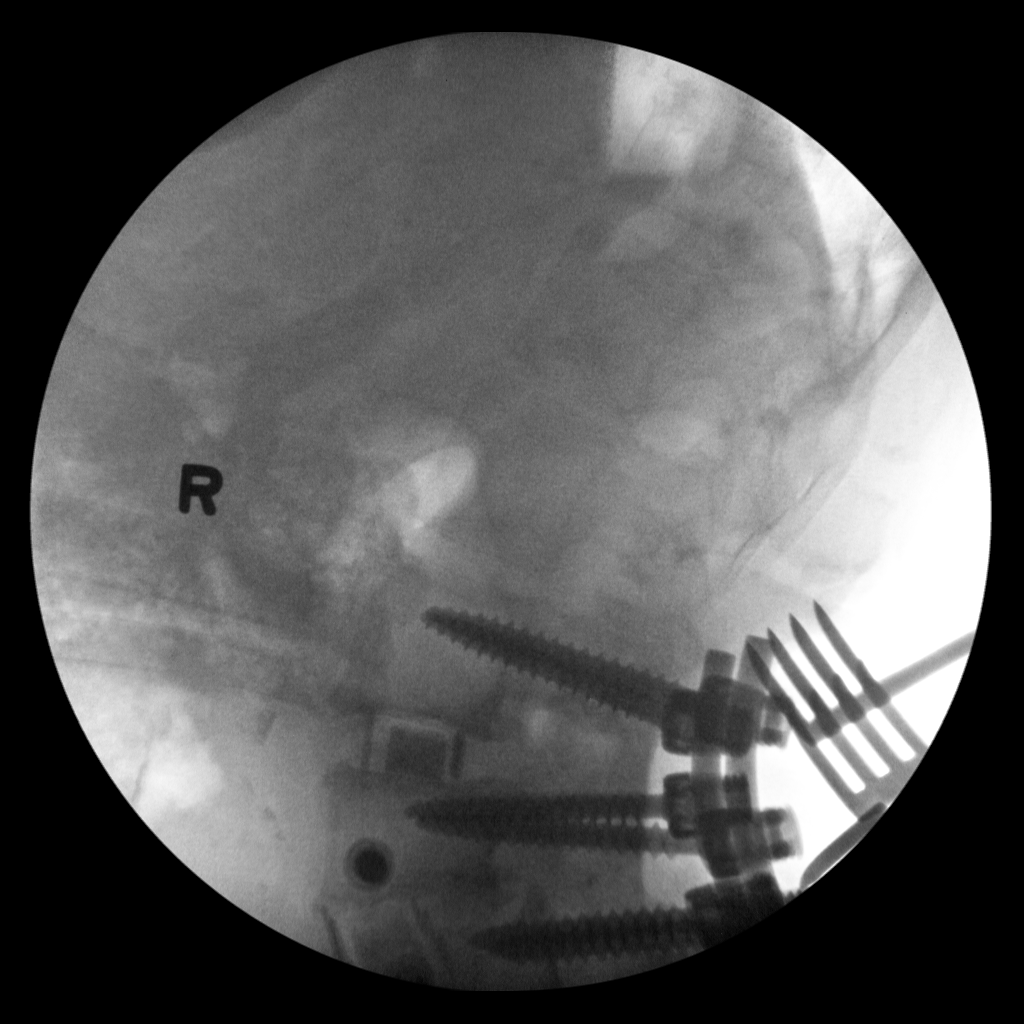
[im 3/3]
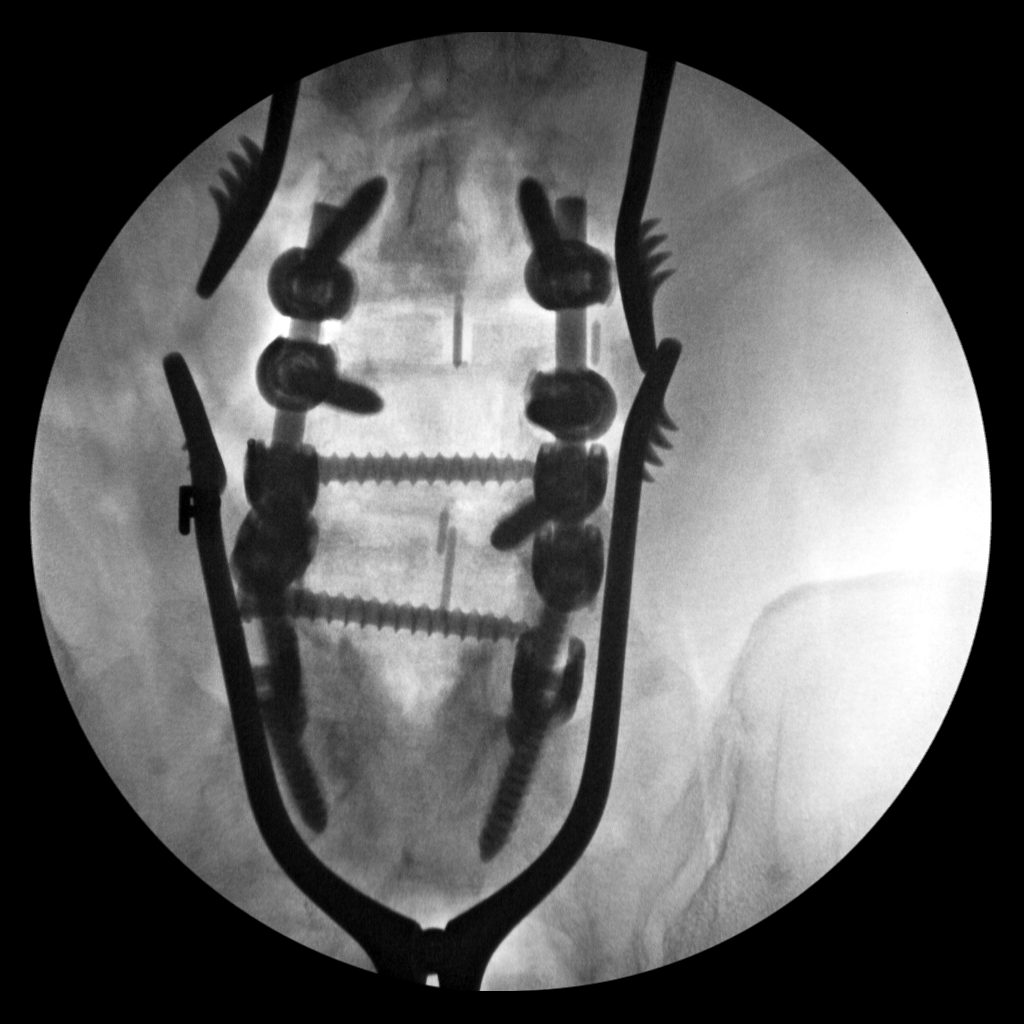

[3 of 3 positions shown; findings below may reference images not displayed]

FINDINGS: Previously, the last open disc space was labeled the L5-S1 level.
The current images are labeled accordingly. Pedicle screw and rod
fixation at the L2 through L5 levels with interbody fusion at the
L2-3 and L3-4 levels. There are also horizontal fixation screws at
the L3 and L4 levels. Grade 1 anterolisthesis at the L3-4 level
without significant change. S1 pedicle screws are noted.
IMPRESSION: Operative changes, as described above.

## 2018-05-12 IMAGING — DX DG CHEST 1V PORT
1 series · 1 of 1 positions shown · non-contrast
Comparison: 11/16/2016 .

CLINICAL DATA: Respiratory failure.

EXAM:
PORTABLE CHEST 1 VIEW

[chest ap]
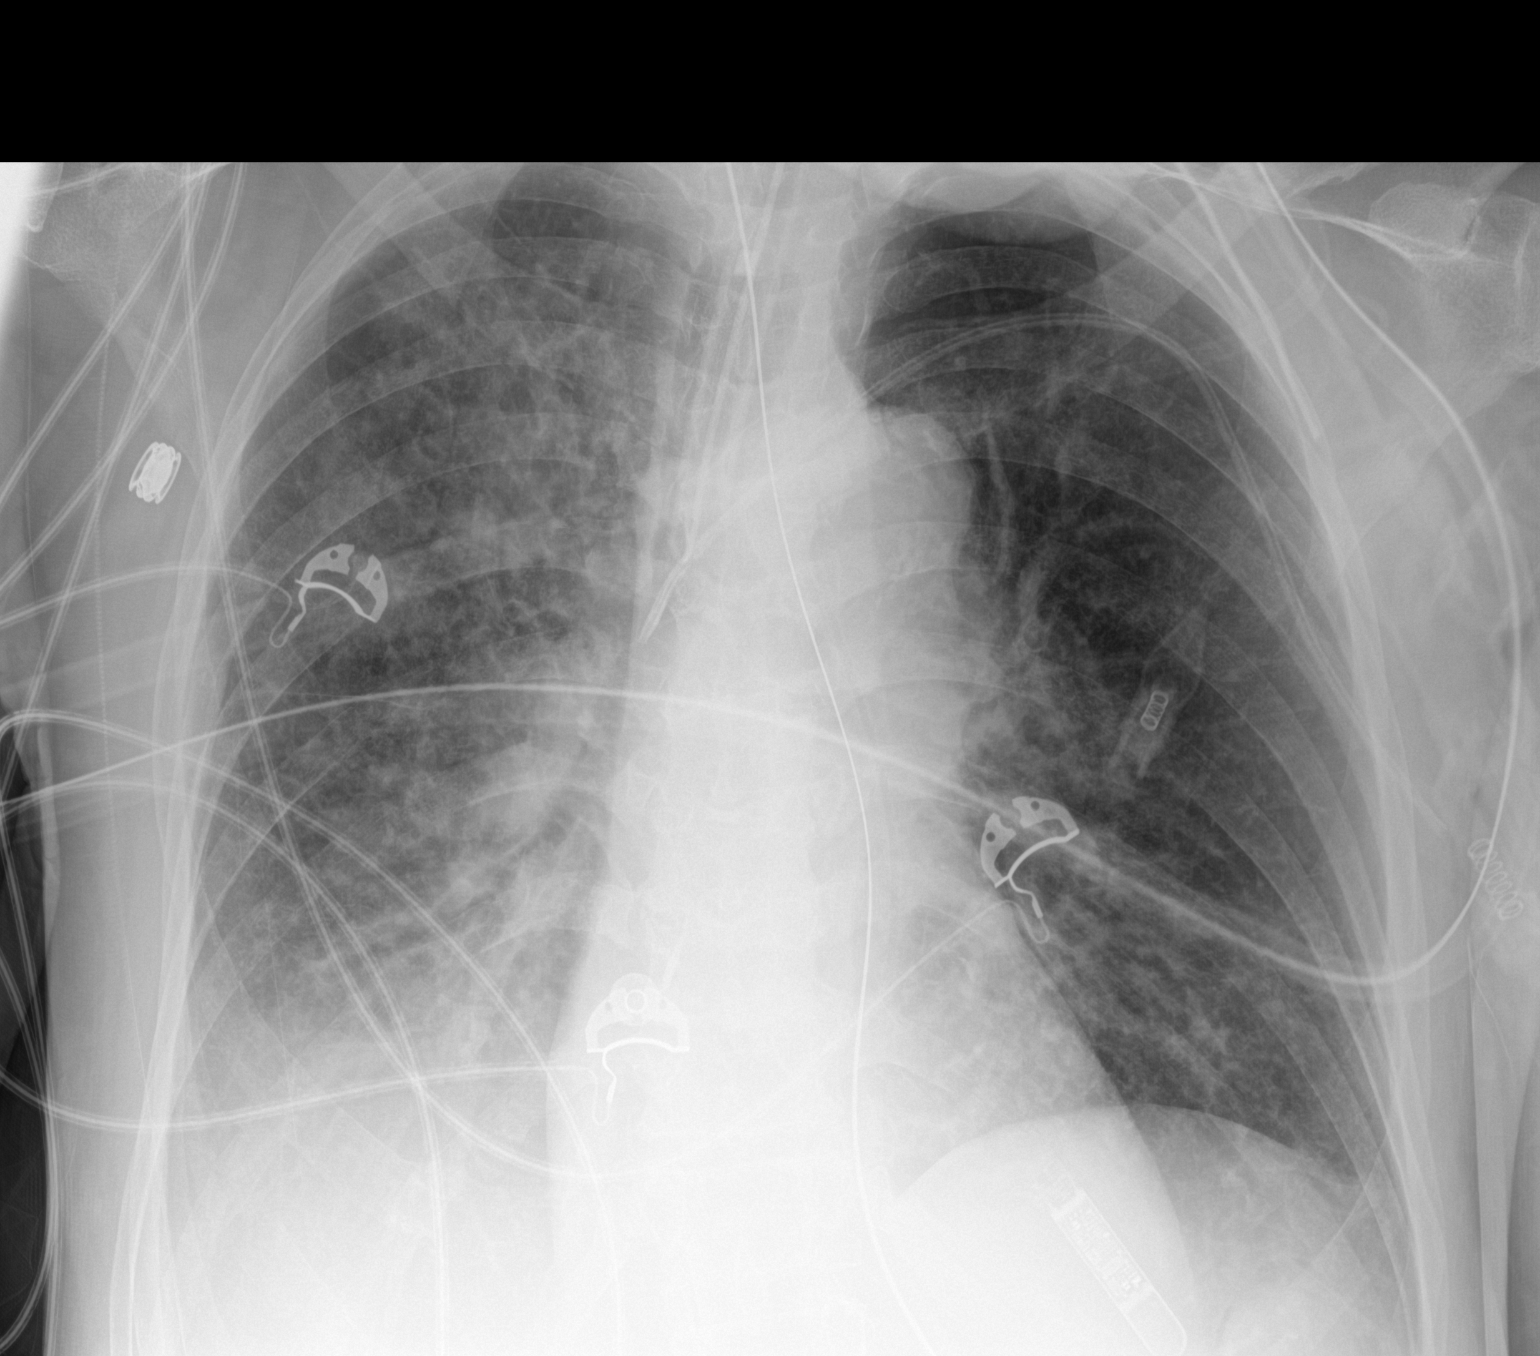

[1 of 1 positions shown; findings below may reference images not displayed]

FINDINGS: Endotracheal tube tip 1.3 cm above the carina. Proximal
repositioning of approximately 2 cm should be considered. NG tube
noted with tip below left hemidiaphragm. Left subclavian line stable
position. Heart size stable. Diffuse right lung infiltrate,
progressed from prior exam. Left base infiltrate. Small right
pleural effusion, new from prior exam. No pneumothorax.
IMPRESSION: 1. Endotracheal tube tip 1.3 cm above the carina. Proximal
repositioning of approximately 2 cm should be considered. NG tube
noted with tip below left hemidiaphragm. Left subclavian line stable
position.

2. Progressive diffuse right lung infiltrate. New small right
pleural effusion.

3. Left base infiltrate again noted.
